# Patient Record
Sex: Female | Born: 1960 | Race: Black or African American
Health system: Southern US, Community
[De-identification: ages and names within clinical notes are randomized; demographics above are authoritative.]

## PROBLEM LIST (undated history)

## (undated) DIAGNOSIS — K623 Rectal prolapse: Secondary | ICD-10-CM

## (undated) DIAGNOSIS — G8929 Other chronic pain: Principal | ICD-10-CM

## (undated) DIAGNOSIS — F32A Depression, unspecified: Secondary | ICD-10-CM

## (undated) DIAGNOSIS — S99929A Unspecified injury of unspecified foot, initial encounter: Secondary | ICD-10-CM

## (undated) DIAGNOSIS — S8990XA Unspecified injury of unspecified lower leg, initial encounter: Secondary | ICD-10-CM

## (undated) DIAGNOSIS — M545 Low back pain: Principal | ICD-10-CM

## (undated) DIAGNOSIS — Z22322 Carrier or suspected carrier of Methicillin resistant Staphylococcus aureus: Secondary | ICD-10-CM

## (undated) DIAGNOSIS — N390 Urinary tract infection, site not specified: Secondary | ICD-10-CM

## (undated) DIAGNOSIS — Z803 Family history of malignant neoplasm of breast: Secondary | ICD-10-CM

## (undated) DIAGNOSIS — F319 Bipolar disorder, unspecified: Secondary | ICD-10-CM

## (undated) DIAGNOSIS — R079 Chest pain, unspecified: Secondary | ICD-10-CM

## (undated) DIAGNOSIS — D2320 Other benign neoplasm of skin of unspecified ear and external auricular canal: Secondary | ICD-10-CM

## (undated) DIAGNOSIS — Z8042 Family history of malignant neoplasm of prostate: Secondary | ICD-10-CM

## (undated) DIAGNOSIS — L738 Other specified follicular disorders: Secondary | ICD-10-CM

## (undated) DIAGNOSIS — F329 Major depressive disorder, single episode, unspecified: Secondary | ICD-10-CM

## (undated) DIAGNOSIS — H6990 Unspecified Eustachian tube disorder, unspecified ear: Secondary | ICD-10-CM

## (undated) DIAGNOSIS — C259 Malignant neoplasm of pancreas, unspecified: Secondary | ICD-10-CM

## (undated) DIAGNOSIS — J449 Chronic obstructive pulmonary disease, unspecified: Secondary | ICD-10-CM

## (undated) DIAGNOSIS — N949 Unspecified condition associated with female genital organs and menstrual cycle: Secondary | ICD-10-CM

## (undated) DIAGNOSIS — H6980 Other specified disorders of Eustachian tube, unspecified ear: Secondary | ICD-10-CM

## (undated) DIAGNOSIS — Z8669 Personal history of other diseases of the nervous system and sense organs: Secondary | ICD-10-CM

## (undated) DIAGNOSIS — S3992XA Unspecified injury of lower back, initial encounter: Secondary | ICD-10-CM

## (undated) DIAGNOSIS — Z808 Family history of malignant neoplasm of other organs or systems: Secondary | ICD-10-CM

## (undated) DIAGNOSIS — R599 Enlarged lymph nodes, unspecified: Secondary | ICD-10-CM

## (undated) DIAGNOSIS — S99919A Unspecified injury of unspecified ankle, initial encounter: Secondary | ICD-10-CM

## (undated) DIAGNOSIS — L678 Other hair color and hair shaft abnormalities: Secondary | ICD-10-CM

## (undated) DIAGNOSIS — T8859XA Other complications of anesthesia, initial encounter: Secondary | ICD-10-CM

## (undated) HISTORY — PX: CHOLECYSTECTOMY: SHX55

## (undated) HISTORY — PX: JOINT REPLACEMENT: SHX530

## (undated) HISTORY — DX: Other complications of anesthesia, initial encounter: T88.59XA

## (undated) HISTORY — PX: HYSTERECTOMY: SHX81

## (undated) HISTORY — DX: Other specified disorders of Eustachian tube, unspecified ear: H69.80

## (undated) HISTORY — DX: Family history of malignant neoplasm of prostate: Z80.42

## (undated) HISTORY — DX: Low back pain: M54.5

## (undated) HISTORY — DX: Unspecified injury of lower back, initial encounter: S39.92XA

## (undated) HISTORY — DX: Major depressive disorder, single episode, unspecified: F32.9

## (undated) HISTORY — DX: Unspecified injury of unspecified lower leg, initial encounter: S99.929A

## (undated) HISTORY — DX: Unspecified condition associated with female genital organs and menstrual cycle: N94.9

## (undated) HISTORY — DX: Other hair color and hair shaft abnormalities: L67.8

## (undated) HISTORY — DX: Bipolar disorder, unspecified: F31.9

## (undated) HISTORY — DX: Unspecified eustachian tube disorder, unspecified ear: H69.90

## (undated) HISTORY — DX: Other specified follicular disorders: L73.8

## (undated) HISTORY — DX: Other chronic pain: G89.29

## (undated) HISTORY — DX: Family history of malignant neoplasm of other organs or systems: Z80.8

## (undated) HISTORY — DX: Malignant neoplasm of pancreas, unspecified: C25.9

## (undated) HISTORY — DX: Family history of malignant neoplasm of breast: Z80.3

## (undated) HISTORY — DX: Rectal prolapse: K62.3

## (undated) HISTORY — DX: Personal history of other diseases of the nervous system and sense organs: Z86.69

## (undated) HISTORY — DX: Unspecified injury of unspecified lower leg, initial encounter: S89.90XA

## (undated) HISTORY — DX: Depression, unspecified: F32.A

## (undated) HISTORY — DX: Carrier or suspected carrier of methicillin resistant Staphylococcus aureus: Z22.322

## (undated) HISTORY — DX: Enlarged lymph nodes, unspecified: R59.9

## (undated) HISTORY — DX: Unspecified injury of unspecified ankle, initial encounter: S99.919A

## (undated) HISTORY — DX: Other benign neoplasm of skin of unspecified ear and external auricular canal: D23.20

## (undated) HISTORY — DX: Chest pain, unspecified: R07.9

## (undated) HISTORY — DX: Urinary tract infection, site not specified: N39.0

## (undated) HISTORY — PX: ABDOMINAL HYSTERECTOMY: SHX81

---

## 2000-02-09 HISTORY — PX: CHOLECYSTECTOMY: SHX55

## 2000-02-09 HISTORY — PX: GANGLION CYST EXCISION: SHX1691

## 2002-02-08 HISTORY — PX: FOOT SURGERY: SHX648

## 2003-04-03 ENCOUNTER — Other Ambulatory Visit: Payer: Self-pay

## 2003-11-30 ENCOUNTER — Emergency Department: Payer: Self-pay | Admitting: General Practice

## 2004-08-31 ENCOUNTER — Ambulatory Visit: Payer: Self-pay | Admitting: Family Medicine

## 2005-02-06 ENCOUNTER — Emergency Department: Payer: Self-pay | Admitting: Emergency Medicine

## 2005-03-18 ENCOUNTER — Emergency Department: Payer: Self-pay | Admitting: Emergency Medicine

## 2005-03-25 ENCOUNTER — Encounter: Payer: Self-pay | Admitting: Family Medicine

## 2005-03-27 ENCOUNTER — Ambulatory Visit: Payer: Self-pay | Admitting: Family Medicine

## 2005-04-08 ENCOUNTER — Encounter: Payer: Self-pay | Admitting: Family Medicine

## 2005-06-05 ENCOUNTER — Emergency Department: Payer: Self-pay | Admitting: Internal Medicine

## 2006-04-19 ENCOUNTER — Emergency Department: Payer: Self-pay | Admitting: Emergency Medicine

## 2006-04-21 ENCOUNTER — Emergency Department: Payer: Self-pay | Admitting: Emergency Medicine

## 2006-04-26 ENCOUNTER — Ambulatory Visit: Payer: Self-pay | Admitting: Emergency Medicine

## 2006-06-19 ENCOUNTER — Emergency Department: Payer: Self-pay

## 2006-10-31 ENCOUNTER — Ambulatory Visit: Payer: Self-pay | Admitting: Family Medicine

## 2006-11-25 ENCOUNTER — Ambulatory Visit: Payer: Self-pay | Admitting: Family Medicine

## 2007-01-05 ENCOUNTER — Emergency Department: Payer: Self-pay | Admitting: Unknown Physician Specialty

## 2008-02-09 HISTORY — PX: OTHER SURGICAL HISTORY: SHX169

## 2008-02-09 HISTORY — PX: PARTIAL HYSTERECTOMY: SHX80

## 2012-02-22 ENCOUNTER — Ambulatory Visit: Payer: Self-pay | Admitting: Family Medicine

## 2012-07-28 DIAGNOSIS — F3110 Bipolar disorder, current episode manic without psychotic features, unspecified: Secondary | ICD-10-CM | POA: Insufficient documentation

## 2012-07-28 DIAGNOSIS — F603 Borderline personality disorder: Secondary | ICD-10-CM | POA: Insufficient documentation

## 2012-12-02 ENCOUNTER — Emergency Department: Payer: Self-pay | Admitting: Emergency Medicine

## 2013-04-18 ENCOUNTER — Emergency Department: Payer: Self-pay | Admitting: Emergency Medicine

## 2013-04-18 LAB — BASIC METABOLIC PANEL
Anion Gap: 4 — ABNORMAL LOW (ref 7–16)
BUN: 8 mg/dL (ref 7–18)
CALCIUM: 9.3 mg/dL (ref 8.5–10.1)
CHLORIDE: 105 mmol/L (ref 98–107)
CO2: 29 mmol/L (ref 21–32)
Creatinine: 1.03 mg/dL (ref 0.60–1.30)
EGFR (African American): >60
EGFR (Non-African Amer.): >60
Glucose: 100 mg/dL — ABNORMAL HIGH (ref 65–99)
OSMOLALITY: 274 (ref 275–301)
POTASSIUM: 3 mmol/L — AB (ref 3.5–5.1)
Sodium: 138 mmol/L (ref 136–145)

## 2013-04-18 LAB — CBC
HCT: 38 % (ref 35.0–47.0)
HGB: 13.1 g/dL (ref 12.0–16.0)
MCH: 31.5 pg (ref 26.0–34.0)
MCHC: 34.5 g/dL (ref 32.0–36.0)
MCV: 91 fL (ref 80–100)
PLATELETS: 295 10*3/uL (ref 150–440)
RBC: 4.17 10*6/uL (ref 3.80–5.20)
RDW: 13.9 % (ref 11.5–14.5)
WBC: 8 10*3/uL (ref 3.6–11.0)

## 2013-04-18 LAB — TROPONIN I

## 2013-05-01 ENCOUNTER — Ambulatory Visit: Payer: Self-pay | Admitting: Family Medicine

## 2013-06-29 ENCOUNTER — Ambulatory Visit: Payer: Self-pay | Admitting: Gastroenterology

## 2013-07-06 ENCOUNTER — Ambulatory Visit: Payer: Self-pay | Admitting: Family Medicine

## 2013-09-24 ENCOUNTER — Emergency Department: Payer: Self-pay | Admitting: Emergency Medicine

## 2013-09-24 LAB — URINALYSIS, COMPLETE
BLOOD: NEGATIVE
Bilirubin,UR: NEGATIVE
GLUCOSE, UR: NEGATIVE mg/dL (ref 0–75)
Ketone: NEGATIVE
NITRITE: NEGATIVE
PROTEIN: NEGATIVE
Ph: 6 (ref 4.5–8.0)
RBC, UR: NONE SEEN /HPF (ref 0–5)
Specific Gravity: 1.002 (ref 1.003–1.030)
WBC UR: 19 /HPF (ref 0–5)

## 2013-09-26 LAB — URINE CULTURE

## 2013-10-22 ENCOUNTER — Encounter: Payer: Self-pay | Admitting: Neurology

## 2013-10-23 ENCOUNTER — Encounter (INDEPENDENT_AMBULATORY_CARE_PROVIDER_SITE_OTHER): Payer: Self-pay

## 2013-10-23 ENCOUNTER — Ambulatory Visit (INDEPENDENT_AMBULATORY_CARE_PROVIDER_SITE_OTHER): Payer: 59 | Admitting: Neurology

## 2013-10-23 ENCOUNTER — Encounter: Payer: Self-pay | Admitting: Neurology

## 2013-10-23 VITALS — BP 131/67 | HR 85 | Ht 61.0 in | Wt 149.0 lb

## 2013-10-23 DIAGNOSIS — M545 Low back pain, unspecified: Secondary | ICD-10-CM

## 2013-10-23 DIAGNOSIS — G8929 Other chronic pain: Secondary | ICD-10-CM

## 2013-10-23 HISTORY — DX: Other chronic pain: G89.29

## 2013-10-23 HISTORY — DX: Low back pain, unspecified: M54.50

## 2013-10-23 MED ORDER — DIAZEPAM 2 MG PO TABS: 2.0000 mg | ORAL_TABLET | Freq: Two times a day (BID) | ORAL | Status: DC

## 2013-10-23 MED ORDER — NAPROXEN 500 MG PO TABS: 500.0000 mg | ORAL_TABLET | Freq: Two times a day (BID) | ORAL | Status: DC

## 2013-10-23 NOTE — Patient Instructions (Signed)
Back Pain, Adult Low back pain is very common. About 1 in 5 people have back pain.The cause of low back pain is rarely dangerous. The pain often gets better over time.About half of people with a sudden onset of back pain feel better in just 2 weeks. About 8 in 10 people feel better by 6 weeks.  CAUSES Some common causes of back pain include:  Strain of the muscles or ligaments supporting the spine.  Wear and tear (degeneration) of the spinal discs.  Arthritis.  Direct injury to the back. DIAGNOSIS Most of the time, the direct cause of low back pain is not known.However, back pain can be treated effectively even when the exact cause of the pain is unknown.Answering your caregiver's questions about your overall health and symptoms is one of the most accurate ways to make sure the cause of your pain is not dangerous. If your caregiver needs more information, he or she may order lab work or imaging tests (X-rays or MRIs).However, even if imaging tests show changes in your back, this usually does not require surgery. HOME CARE INSTRUCTIONS For many people, back pain returns.Since low back pain is rarely dangerous, it is often a condition that people can learn to manageon their own.   Remain active. It is stressful on the back to sit or stand in one place. Do not sit, drive, or stand in one place for more than 30 minutes at a time. Take short walks on level surfaces as soon as pain allows.Try to increase the length of time you walk each day.  Do not stay in bed.Resting more than 1 or 2 days can delay your recovery.  Do not avoid exercise or work.Your body is made to move.It is not dangerous to be active, even though your back may hurt.Your back will likely heal faster if you return to being active before your pain is gone.  Pay attention to your body when you bend and lift. Many people have less discomfortwhen lifting if they bend their knees, keep the load close to their bodies,and  avoid twisting. Often, the most comfortable positions are those that put less stress on your recovering back.  Find a comfortable position to sleep. Use a firm mattress and lie on your side with your knees slightly bent. If you lie on your back, put a pillow under your knees.  Only take over-the-counter or prescription medicines as directed by your caregiver. Over-the-counter medicines to reduce pain and inflammation are often the most helpful.Your caregiver may prescribe muscle relaxant drugs.These medicines help dull your pain so you can more quickly return to your normal activities and healthy exercise.  Put ice on the injured area.  Put ice in a plastic bag.  Place a towel between your skin and the bag.  Leave the ice on for 15-20 minutes, 03-04 times a day for the first 2 to 3 days. After that, ice and heat may be alternated to reduce pain and spasms.  Ask your caregiver about trying back exercises and gentle massage. This may be of some benefit.  Avoid feeling anxious or stressed.Stress increases muscle tension and can worsen back pain.It is important to recognize when you are anxious or stressed and learn ways to manage it.Exercise is a great option. SEEK MEDICAL CARE IF:  You have pain that is not relieved with rest or medicine.  You have pain that does not improve in 1 week.  You have new symptoms.  You are generally not feeling well. SEEK   IMMEDIATE MEDICAL CARE IF:   You have pain that radiates from your back into your legs.  You develop new bowel or bladder control problems.  You have unusual weakness or numbness in your arms or legs.  You develop nausea or vomiting.  You develop abdominal pain.  You feel faint. Document Released: 01/25/2005 Document Revised: 07/27/2011 Document Reviewed: 05/29/2013 Exit[employer] Patient Information 2015 [employer]. This information is not intended to replace advice given to you by your health care provider. Make sure you  discuss any questions you have with your health care provider.  

## 2013-10-23 NOTE — Progress Notes (Signed)
Reason for visit: Back pain  Sheryl Perez is a 53 y.o. female  History of present illness:  Ms. Witz is a 53 year old right-handed white female with a history of low back pain that began approximately one month ago. The patient was cleaning a glass in her car, and when she came out of the car, she twisted her back, and began having left-sided low back pain without radiation down the legs. The pain has persisted, and it is most severe when she is transitioning from sitting to standing, but prolonged sitting will also bother her. The patient has had no numbness or weakness of extremities. She does have some mid thoracic pain as well. She has been placed on diazepam with some benefit. She indicates that she take some Ultram also if needed for the pain. The patient denies any balance problems. 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 The patient is having difficulty doing this. She has been seen by her chiropractor, and she is getting chiropractic therapy. The patient is sent to this office for an evaluation.  Past Medical History  Diagnosis Date  . Depression   . Bipolar disorder   . Chronic UTI   . Other specified disease of hair and hair follicles   . Injury, other and unspecified, knee, leg, ankle, and foot   . Chest pain, unspecified   . Dysfunction of eustachian tube   . Unspecified symptom associated with female genital organs   . Rectal prolapse   . Enlargement of lymph nodes   . 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 Back injury   . Chronic low back pain 10/23/2013  . History of Bell's palsy Right    Past Surgical History  Procedure Laterality Date  . Cholecystectomy  2002  . Foot surgery  2004    bunion  . Ganglion cyst excision  2002  . Partial hysterectomy  2010    uterus removed  . Bladder tack  2010    Family History  Problem Relation Age of Onset  . Parkinson's disease Maternal Grandfather   .  Diabetes Father   . Cancer Father     brain tumor  . Diabetes Paternal Grandfather   . Colon polyps Mother   . Heart disease Maternal Grandmother   . Heart attack Maternal Grandmother   . Stroke Maternal Grandmother     Social history:  reports that she has been smoking Cigarettes.  She has a 40 pack-year smoking history. She has never used smokeless tobacco. She reports that she drinks alcohol. She reports that she uses illicit drugs (Marijuana).  Medications:  Current Outpatient Prescriptions on File Prior to Visit  Medication Sig Dispense Refill  . calcium-vitamin D (OSCAL) 250-125 MG-UNIT per tablet Take 1 tablet by mouth daily.      . cyanocobalamin 1000 MCG tablet Take 100 mcg by mouth daily.      Marland Kitchen LORazepam (ATIVAN) 1 MG tablet Take 1 mg by mouth every 8 (eight) hours.      . Multiple Vitamin (MULTIVITAMIN) tablet Take 1 tablet by mouth daily.      . traMADol (ULTRAM) 50 MG tablet Take 50 mg by mouth every 6 (six) hours as needed.      . traZODone (DESYREL) 50 MG tablet Take 50 mg by mouth at bedtime.       No current facility-administered medications on file prior to visit.  Allergies  Allergen Reactions  . Sulfa Antibiotics Swelling    Diarrhea,dizziness, tongue swelling  . Abilify [Aripiprazole]   . Cephalosporins Rash  . Erythromycin Other (See Comments)    Abd. pain  . Risperdal [Risperidone]     tremors    ROS:  Out of a complete 14 system review of symptoms, the patient complains only of the following symptoms, and all other reviewed systems are negative.  Fatigue Urination problems Joint pain, achy muscles Allergies Depression, anxiety  Blood pressure 131/67, pulse 85, height 5\' 1"  (1.549 m), weight 149 lb (67.586 kg).  Physical Exam  General: The patient is alert and cooperative at the time of the examination.  Eyes: Pupils are equal, round, and reactive to light. Discs are flat bilaterally.  Neck: The neck is supple, no carotid bruits are  noted.  Respiratory: The respiratory examination is clear.  Cardiovascular: The cardiovascular examination reveals a regular rate and rhythm, no obvious murmurs or rubs are noted.  Neuromuscular: The patient is full range of movement of the lumbosacral spine.  Skin: Extremities are without significant edema.  Neurologic Exam  Mental status: The patient is alert and oriented x 3 at the time of the examination. The patient has apparent normal recent and remote memory, with an apparently normal attention span and concentration ability.  Cranial nerves: Facial symmetry is not present. There is relative depression of the left nasolabial fold as compared to the right. Movements of seen synkinesis are seen around the right eye with talking. There is good sensation of the face to pinprick and soft touch bilaterally. The strength of the facial muscles and the muscles to head turning and shoulder shrug are normal bilaterally. Speech is well enunciated, no aphasia or dysarthria is noted. Extraocular movements are full. Visual fields are full. The tongue is midline, and the patient has symmetric elevation of the soft palate. No obvious hearing deficits are noted.  Motor: The motor testing reveals 5 over 5 strength of all 4 extremities. Good symmetric motor tone is noted throughout.  Sensory: Sensory testing is intact to pinprick, soft touch, vibration sensation, and position sense on all 4 extremities. No evidence of extinction is noted.  Coordination: Cerebellar testing reveals good finger-nose-finger and heel-to-shin bilaterally.  Gait and station: Gait is normal. Tandem gait is normal. Romberg is negative. No drift is seen.  Reflexes: Deep tendon reflexes are symmetric and normal bilaterally. Toes are downgoing bilaterally.   Assessment/Plan:  1. Low back pain  The patient has developed left-sided low back pain within the last month. The patient will be placed back on diazepam taking 2 mg twice  daily, and go on naproxen taking 500 mg twice daily. The patient will continue her chiropractic therapies, but she has not improved over the next 3 weeks or so, she is to contact our office. At that point, we will consider MRI evaluation of the lumbosacral spine. The patient will followup in about 2 months. Clinical examination and history do not support a lumbosacral radiculopathy.  Jill Alexanders MD 10/23/2013 8:37 PM  [facility] [address] [place], [place] [address]  Phone [phone] Fax [phone]

## 2013-10-29 ENCOUNTER — Other Ambulatory Visit: Payer: Self-pay | Admitting: Neurology

## 2013-10-29 ENCOUNTER — Telehealth: Payer: Self-pay | Admitting: Neurology

## 2013-10-29 NOTE — Telephone Encounter (Signed)
Patient stated [facility] didn't get Rx refill request traMADol (ULTRAM) 50 MG tablet.  Please call when ready to pick up.

## 2013-10-29 NOTE — Telephone Encounter (Signed)
Error

## 2013-11-02 ENCOUNTER — Telehealth: Payer: Self-pay | Admitting: Neurology

## 2013-11-02 NOTE — Telephone Encounter (Signed)
Patient is calling. Rx for Tramadol was sent to [facility] in [place] but for only 20 pills. Rx should be for 60 pills to take twice a day. Please call patient and advise.

## 2013-11-02 NOTE — Telephone Encounter (Signed)
See phone note

## 2013-11-02 NOTE — Telephone Encounter (Signed)
I am not sure how this rx was done. No doccumentation in my note, the Rx was written on 10/30/13, I saw the patient 10/23/13, no note doccumenting that the patient called our office for a refill. I have not given her this Rx before.

## 2013-11-06 MED ORDER — TRAMADOL HCL 50 MG PO TABS: 100.0000 mg | ORAL_TABLET | Freq: Four times a day (QID) | ORAL | Status: DC | PRN

## 2013-11-06 NOTE — Telephone Encounter (Addendum)
I will rewrite the prescription for ultram.

## 2013-11-06 NOTE — Telephone Encounter (Signed)
Spoke with Dr. Tobey Grim assistant Davy Pique, [place] and Davy Pique stated that Dr. Jannifer Franklin and herself had this conversation concerning this Rx and the pt. Please discuss with Davy Pique, CMA. Thanks

## 2013-11-06 NOTE — Addendum Note (Signed)
Addended by: Margette Fast on: 11/06/2013 01:25 PM   Modules accepted: Orders

## 2013-12-25 ENCOUNTER — Telehealth: Payer: Self-pay | Admitting: Neurology

## 2013-12-25 MED ORDER — DIAZEPAM 2 MG PO TABS: 2.0000 mg | ORAL_TABLET | Freq: Two times a day (BID) | ORAL | Status: DC

## 2013-12-25 MED ORDER — TRAMADOL HCL 50 MG PO TABS: 100.0000 mg | ORAL_TABLET | Freq: Four times a day (QID) | ORAL | Status: DC | PRN

## 2013-12-25 MED ORDER — NAPROXEN 500 MG PO TABS: 500.0000 mg | ORAL_TABLET | Freq: Two times a day (BID) | ORAL | Status: DC

## 2013-12-25 NOTE — Telephone Encounter (Signed)
I called the patient. She is not requesting a refill on the trazodone, she wants a refill on the diazepam, Naprosyn, and Ultram. I will give the refills.

## 2013-12-25 NOTE — Telephone Encounter (Signed)
Patient called requesting we fill Trazodone.  It does not appear we have written this Rx before.  Would you like to prescribe? Please advise.  Thank you.  As well, they are requesting Naproxen and Diazepam refills.

## 2013-12-25 NOTE — Telephone Encounter (Signed)
Patient requesting Rx refill for traZODone (DESYREL) 50 MG tablet, diazepam (VALIUM) 2 MG tablet and naproxen (NAPROSYN) 500 MG tablet.  Please call and advise.

## 2013-12-28 ENCOUNTER — Ambulatory Visit: Payer: 59 | Admitting: Adult Health

## 2014-02-12 ENCOUNTER — Other Ambulatory Visit: Payer: Self-pay | Admitting: Neurology

## 2014-02-15 ENCOUNTER — Ambulatory Visit: Payer: 59 | Admitting: Adult Health

## 2014-03-15 ENCOUNTER — Other Ambulatory Visit: Payer: Self-pay | Admitting: Neurology

## 2014-04-26 ENCOUNTER — Telehealth: Payer: Self-pay | Admitting: Adult Health

## 2014-04-26 ENCOUNTER — Encounter: Payer: Self-pay | Admitting: Adult Health

## 2014-04-26 ENCOUNTER — Ambulatory Visit (INDEPENDENT_AMBULATORY_CARE_PROVIDER_SITE_OTHER): Payer: 59 | Admitting: Adult Health

## 2014-04-26 VITALS — BP 136/75 | HR 74 | Ht 61.0 in | Wt 156.0 lb

## 2014-04-26 DIAGNOSIS — M545 Low back pain, unspecified: Secondary | ICD-10-CM

## 2014-04-26 NOTE — Progress Notes (Signed)
PATIENT: Sheryl Perez DOB: [birth year]  REASON FOR VISIT: follow up- low back pain HISTORY FROM: patient  HISTORY OF PRESENT ILLNESS: Sheryl Perez is a 54 year old female with a history of low back pain. She returns today for follow-up. She states that her back pain has been controlled with diazepam, naproxen and Ultram. However she states that approximately 1 month ago she had an injury while at work. 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 She states that she begin to have low back pain in the midline. She also had some straining of the thigh muscles as well as the shoulders. She states that the medication has helped however the pain has continued especially after she's been sitting for a long period of time. She has continued to go to the chiropractor and it offers temporary relief. She denies any numbness or tingling down the legs. No changes with the bowels or bladder. She returns today for an evaluation.  Sheryl Perez is a 54 year old right-handed white female with a history of low back pain that began approximately one month ago. The patient was cleaning a glass in her car, and when she came out of the car, she twisted her back, and began having left-sided low back pain without radiation down the legs. The pain has persisted, and it is most severe when she is transitioning from sitting to standing, but prolonged sitting will also bother her. The patient has had no numbness or weakness of extremities. She does have some mid thoracic pain as well. She has been placed on diazepam with some benefit. She indicates that she take some Ultram also if needed for the pain. The patient denies any balance problems. 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 The patient is having difficulty doing this. She has been seen by her chiropractor, and she is getting chiropractic therapy. The patient is sent to this  office for an evaluation  REVIEW OF SYSTEMS: Out of a complete 14 system review of symptoms, the patient complains only of the following symptoms, and all other reviewed systems are negative.  Joint pain, joint swelling, back pain, aching muscles, wounds, itching  ALLERGIES: Allergies  Allergen Reactions  . Sulfa Antibiotics Swelling    Diarrhea,dizziness, tongue swelling  . Abilify [Aripiprazole]   . Cephalosporins Rash  . Erythromycin Other (See Comments)    Abd. pain  . Risperdal [Risperidone]     tremors    HOME MEDICATIONS: Outpatient Prescriptions Prior to Visit  Medication Sig Dispense Refill  . Ascorbic Acid (VITAMIN C) 1000 MG tablet Take 1,000 mg by mouth daily.    . Calcium Citrate-Vitamin D (CALCIUM CITRATE + D PO) Take 1 capsule by mouth daily.    . calcium-vitamin D (OSCAL) 250-125 MG-UNIT per tablet Take 1 tablet by mouth daily.    . diazepam (VALIUM) 2 MG tablet Take 1 tablet (2 mg total) by mouth 2 (two) times daily. 60 tablet 1  . Ginkgo Biloba 120 MG CAPS Take 1 capsule by mouth daily.    . Multiple Vitamin (MULTIVITAMIN) tablet Take 1 tablet by mouth daily.    . naproxen (NAPROSYN) 500 MG tablet TAKE 1 TABLET BY MOUTH TWICE A DAY WITH A MEAL 60 tablet 3  . traMADol (ULTRAM) 50 MG tablet Take 2 tablets (100 mg total) by mouth every 6 (six) hours as needed. Must last 28 days 60  tablet 1  . traZODone (DESYREL) 50 MG tablet Take 50 mg by mouth at bedtime.    . cyanocobalamin 1000 MCG tablet Take 100 mcg by mouth daily.    Marland Kitchen LORazepam (ATIVAN) 1 MG tablet Take 1 mg by mouth every 8 (eight) hours.     No facility-administered medications prior to visit.    PAST MEDICAL HISTORY: Past Medical History  Diagnosis Date  . Depression   . Bipolar disorder   . Chronic UTI   . Other specified disease of hair and hair follicles   . Injury, other and unspecified, knee, leg, ankle, and foot   . Chest pain, unspecified   . Dysfunction of eustachian tube   . Unspecified  symptom associated with female genital organs   . Rectal prolapse   . Enlargement of lymph nodes   . 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 Back injury   . Chronic low back pain 10/23/2013  . History of Bell's palsy Right    PAST SURGICAL HISTORY: Past Surgical History  Procedure Laterality Date  . Cholecystectomy  2002  . Foot surgery  2004    bunion  . Ganglion cyst excision  2002  . Partial hysterectomy  2010    uterus removed  . Bladder tack  2010    FAMILY HISTORY: Family History  Problem Relation Age of Onset  . Parkinson's disease Maternal Grandfather   . Diabetes Father   . Cancer Father     brain tumor  . Diabetes Paternal Grandfather   . Colon polyps Mother   . Heart disease Maternal Grandmother   . Heart attack Maternal Grandmother   . Stroke Maternal Grandmother     SOCIAL HISTORY: History   Social History  . Marital Status: Married    Spouse Name: N/A  . Number of Children: 3  . Years of Education: hs   Occupational History  . [employer]   Social History Main Topics  . Smoking status: Current Every Day Smoker -- 1.00 packs/day for 40 years    Types: Cigarettes  . Smokeless tobacco: Never Used  . Alcohol Use: 0.0 oz/week    0 Standard drinks or equivalent per week     Comment: occasional  . Drug Use: Yes    Special: Marijuana  . Sexual Activity: Not on file   Other Topics Concern  . Not on file   Social History Narrative   Patient lives at home with her husband [name]).   Patient works full time.   Education CNA    Right handed.   Caffeine Tea , Soda.      PHYSICAL EXAM  Filed Vitals:   04/26/14 1141  BP: 136/75  Pulse: 74  Height: 5\' 1"  (1.549 m)  Weight: 156 lb (70.761 kg)   Body mass index is 29.49 kg/(m^2).  Generalized: Well developed, in no acute distress   Neurological examination  Mentation: Alert oriented to time, place, history taking. Follows all commands speech and  language fluent Cranial nerve II-XII: Pupils were equal round reactive to light. Extraocular movements were full, visual field were full on confrontational test. Facial sensation and strength were normal. Uvula tongue midline. Head turning and shoulder shrug  were normal and symmetric. Motor: The motor testing reveals 5 over 5 strength of all 4 extremities. Good symmetric motor tone is noted throughout. Mild tenderness to palpation in the lower back in the midline region. Sensory: Sensory testing is  intact to soft touch on all 4 extremities. No evidence of extinction is noted.  Coordination: Cerebellar testing reveals good finger-nose-finger and heel-to-shin bilaterally.  Gait and station: Gait is slightly wide-based. Tandem gait is normal. Romberg is negative. No drift is seen.  Reflexes: Deep tendon reflexes are symmetric and normal bilaterally.    DIAGNOSTIC DATA (LABS, IMAGING, TESTING) - I reviewed patient records, labs, notes, testing and imaging myself where available.     ASSESSMENT AND PLAN 54 y.o. 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 here with:  1. Low back pain  Patient's back pain has improved with medication however she reinjured her back about 1 month ago. She has continued taking the medication as well as receiving chiropractor therapy however this has only offered temporary relief. I will check an MRI of the lumbar spine. Pending those results we will decide on treatment. She will follow-up in 3-4 months or sooner if  needed.   Ward Givens, MSN, NP-C 04/26/2014, 12:00 PM [facility] [address] [place], [place] [address] [phone]  Note: This document was prepared with digital dictation and possible smart phrase technology. Any transcriptional errors that result from this process are unintentional.

## 2014-04-26 NOTE — Progress Notes (Signed)
I have read the note, and I agree with the clinical assessment and plan.  Kenlyn Lose KEITH   

## 2014-04-26 NOTE — Patient Instructions (Signed)
Continue Medications I will check MRI lumbar spine.

## 2014-04-26 NOTE — Telephone Encounter (Signed)
mri lumbar denied case # [id number] please call [phone] for peer to peer

## 2014-04-26 NOTE — Telephone Encounter (Signed)
The case will be denied because the symptoms have been present for only 4 weeks. I will wait 2 weeks, call the patient, if she is still having symptoms, I will call to get the denial overturned. Must wait at least 6 weeks with treatment before MRI can be done.

## 2014-04-29 NOTE — Telephone Encounter (Signed)
I called the patient she is aware. I will call in 2 weeks if no improvement in symptoms then we will reorder the MRI of the lumbar spine.

## 2014-05-04 ENCOUNTER — Other Ambulatory Visit: Payer: Self-pay | Admitting: Neurology

## 2014-05-06 NOTE — Telephone Encounter (Signed)
[facility] faxed Rx

## 2014-05-21 ENCOUNTER — Telehealth: Payer: Self-pay | Admitting: Adult Health

## 2014-05-21 NOTE — Telephone Encounter (Signed)
-----   Message from Trudie Buckler, NP sent at 04/29/2014 12:25 PM EDT ----- Regarding: MRI lumbar MRI lumbar

## 2014-05-21 NOTE — Telephone Encounter (Signed)
I called the patient and left a message for her to call our office. This is in regards to her low back pain and if her symptoms have improved. If not we will reorder the MRI of the lumbar spine.

## 2014-05-22 ENCOUNTER — Telehealth: Payer: Self-pay | Admitting: Adult Health

## 2014-05-22 DIAGNOSIS — M545 Low back pain, unspecified: Secondary | ICD-10-CM

## 2014-05-22 NOTE — Telephone Encounter (Signed)
I called the patient. She states that she has continued to have back pain. She states that her pain is located from the middle back down to the lower back. Denies any pain or numbness down the legs. 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 She followed up with the workman compensation doctor. Who has set her up an appointment with an orthopedic surgeon at [facility]. She has not gotten this appointment yet she is waiting for her records to be faxed to this office. I will try to reorder the MRI of the lumbar spine. If this gets denied again we may have to wait until the patient follows up with the orthopedic surgeon for their recommendations.

## 2014-05-22 NOTE — Telephone Encounter (Signed)
patient returning your call to discuss back pain and reordering MRI please call

## 2014-05-28 ENCOUNTER — Telehealth: Payer: Self-pay | Admitting: Neurology

## 2014-05-28 NOTE — Telephone Encounter (Signed)
-----   Message from Kathrynn Ducking, MD sent at 04/26/2014  4:28 PM EDT ----- Please call insurance company for MRI lumbar denial.  Telephone number [phone]. A case number is [id number].

## 2014-05-28 NOTE — Telephone Encounter (Signed)
The patient was called. She is still having back pain. The MRI of the L/S spine was reordered.

## 2014-06-21 ENCOUNTER — Other Ambulatory Visit: Payer: Self-pay | Admitting: Family Medicine

## 2014-06-21 DIAGNOSIS — Z1231 Encounter for screening mammogram for malignant neoplasm of breast: Secondary | ICD-10-CM

## 2014-06-26 ENCOUNTER — Ambulatory Visit: Payer: Self-pay

## 2014-07-04 ENCOUNTER — Other Ambulatory Visit: Payer: Self-pay | Admitting: Family Medicine

## 2014-07-04 ENCOUNTER — Ambulatory Visit
Admission: RE | Admit: 2014-07-04 | Discharge: 2014-07-04 | Disposition: A | Discharge status: Home / Self Care | Payer: 59 | Source: Ambulatory Visit | Attending: Family Medicine | Admitting: Family Medicine

## 2014-07-04 DIAGNOSIS — Z1231 Encounter for screening mammogram for malignant neoplasm of breast: Secondary | ICD-10-CM

## 2014-07-04 DIAGNOSIS — R922 Inconclusive mammogram: Secondary | ICD-10-CM | POA: Insufficient documentation

## 2014-07-04 DIAGNOSIS — R928 Other abnormal and inconclusive findings on diagnostic imaging of breast: Secondary | ICD-10-CM

## 2014-07-10 ENCOUNTER — Ambulatory Visit
Admission: RE | Admit: 2014-07-10 | Discharge: 2014-07-10 | Disposition: A | Discharge status: Home / Self Care | Payer: 59 | Source: Ambulatory Visit | Attending: Family Medicine | Admitting: Family Medicine

## 2014-07-10 DIAGNOSIS — N63 Unspecified lump in breast: Secondary | ICD-10-CM | POA: Insufficient documentation

## 2014-07-10 DIAGNOSIS — R928 Other abnormal and inconclusive findings on diagnostic imaging of breast: Secondary | ICD-10-CM

## 2014-07-11 ENCOUNTER — Ambulatory Visit (INDEPENDENT_AMBULATORY_CARE_PROVIDER_SITE_OTHER): Payer: 59 | Admitting: Family Medicine

## 2014-07-11 ENCOUNTER — Other Ambulatory Visit: Payer: Self-pay | Admitting: Family Medicine

## 2014-07-11 ENCOUNTER — Encounter: Payer: Self-pay | Admitting: Family Medicine

## 2014-07-11 VITALS — BP 123/80 | HR 86 | Temp 97.8°F | Ht 60.0 in | Wt 170.6 lb

## 2014-07-11 DIAGNOSIS — Z720 Tobacco use: Secondary | ICD-10-CM

## 2014-07-11 DIAGNOSIS — F17200 Nicotine dependence, unspecified, uncomplicated: Secondary | ICD-10-CM | POA: Insufficient documentation

## 2014-07-11 DIAGNOSIS — Z22322 Carrier or suspected carrier of Methicillin resistant Staphylococcus aureus: Secondary | ICD-10-CM

## 2014-07-11 DIAGNOSIS — L732 Hidradenitis suppurativa: Secondary | ICD-10-CM | POA: Diagnosis not present

## 2014-07-11 HISTORY — DX: Carrier or suspected carrier of methicillin resistant Staphylococcus aureus: Z22.322

## 2014-07-11 MED ORDER — CLINDAMYCIN PHOSPHATE 1 % EX SOLN: Freq: Two times a day (BID) | CUTANEOUS | Status: DC

## 2014-07-11 MED ORDER — DOXYCYCLINE HYCLATE 100 MG PO TABS: 100.0000 mg | ORAL_TABLET | Freq: Two times a day (BID) | ORAL | Status: AC

## 2014-07-11 NOTE — Progress Notes (Signed)
Subjective:    Patient ID: Sheryl Perez, female    DOB: 1960/04/10, 54 y.o.   MRN: [id number]  HPI: Sheryl Perez is a 54 y.o. female presenting on 07/11/2014 for cyst L side under armpit   HPI Pt presents for cyst in the axilla. Pt had ultrasound yesterday and they saw cyst in the L axilla. Pt noted that the area opened up and drained 5/31- serosanguinous drainage with foul smell. Pt has had several similar growths in axilla and groin over the past 10 years. Pt is reporting similar growths in the axilla that have drained.  3 in the L and 2 in the right. Pt has had several lanced over the years. No fevers, malaise, rash.  Lesions are tender to the touch.   Past Medical History  Diagnosis Date  . Depression   . Bipolar disorder   . Chronic UTI   . Other specified disease of hair and hair follicles   . Injury, other and unspecified, knee, leg, ankle, and foot   . Chest pain, unspecified   . Dysfunction of eustachian tube   . Unspecified symptom associated with female genital organs   . Rectal prolapse   . Enlargement of lymph nodes   . 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 Back injury   . Chronic low back pain 10/23/2013  . History of Bell's palsy Right   History   Social History  . Marital Status: Married    Spouse Name: N/A  . Number of Children: 3  . Years of Education: hs   Occupational History  . [place]   Social History Main Topics  . Smoking status: Current Every Day Smoker -- 1.00 packs/day for 40 years    Types: Cigarettes  . Smokeless tobacco: Never Used  . Alcohol Use: 0.6 oz/week    0 Standard drinks or equivalent, 1 Glasses of wine per week     Comment: occasional  . Drug Use: No  . Sexual Activity: Not on file   Other Topics Concern  . Not on file   Social History Narrative   Patient lives at home with her husband [name]).   Patient works full time.   Education CNA    Right handed.   Caffeine Tea ,  Soda.   Family History  Problem Relation Age of Onset  . Parkinson's disease Maternal Grandfather   . Diabetes Father   . Cancer Father     brain tumor  . Diabetes Paternal Grandfather   . Colon polyps Mother   . Heart disease Maternal Grandmother   . Heart attack Maternal Grandmother   . Stroke Maternal Grandmother   . Breast cancer Paternal Aunt   . Breast cancer Cousin    Current Outpatient Prescriptions on File Prior to Visit  Medication Sig  . Ascorbic Acid (VITAMIN C) 1000 MG tablet Take 1,000 mg by mouth daily.  . Calcium Citrate-Vitamin D (CALCIUM CITRATE + D PO) Take 1 capsule by mouth daily.  . calcium-vitamin D (OSCAL) 250-125 MG-UNIT per tablet Take 1 tablet by mouth daily.  . diazepam (VALIUM) 2 MG tablet TAKE 1 TABLET BY MOUTH 2 TIMES A DAY  . Ginkgo Biloba 120 MG CAPS Take 1 capsule by mouth daily.  . Multiple Vitamin (MULTIVITAMIN) tablet Take 1 tablet by mouth daily.  . naproxen (NAPROSYN) 500 MG tablet TAKE 1 TABLET BY MOUTH TWICE A DAY WITH A MEAL  .  traMADol (ULTRAM) 50 MG tablet TAKE 2 TABLETS BY MOUTH EVERY 6 HOURS AS NEEDED. **MUST LAST 28 DAYS**  . traZODone (DESYREL) 50 MG tablet Take 50 mg by mouth at bedtime.   No current facility-administered medications on file prior to visit.    Review of Systems  Constitutional: Negative for fever and chills.  HENT: Negative.   Respiratory: Negative.   Gastrointestinal: Negative.   Genitourinary: Negative.   Skin: Positive for wound (Open draining lesion in the L axilla and 3 closed lesions.).  Neurological: Negative.   Psychiatric/Behavioral: Negative.    Per HPI unless specifically indicated above     Objective:    BP 123/80 mmHg  Pulse 86  Temp(Src) 97.8 F (36.6 C) (Oral)  Ht 5' (1.524 m)  Wt 170 lb 9.6 oz (77.384 kg)  BMI 33.32 kg/m2  LMP 02/09/2008  Wt Readings from Last 3 Encounters:  07/11/14 170 lb 9.6 oz (77.384 kg)  05/23/14 160 lb (72.576 kg)  04/26/14 156 lb (70.761 kg)    Physical  Exam  Constitutional: She is oriented to person, place, and time. She appears well-developed and well-nourished.  Neck: Normal range of motion. Neck supple.  Cardiovascular: Normal rate and regular rhythm.   Neurological: She is oriented to person, place, and time.  Skin: Lesion noted. No bruising noted. There is erythema.      Results for orders placed or performed in visit on 09/24/13  Urine culture  Result Value Ref Range   Micro Text Report         SOURCE: CLEAN CATCH    ORGANISM 1                50,000 CFU/ML CITROBACTER FREUNDII   ANTIBIOTIC                    ORG#1     CEFAZOLIN                     R         CEFOXITIN                     R         CEFTRIAXONE                   S         CIPROFLOXACIN                 S         GENTAMICIN                    S         IMIPENEM                      S         LEVOFLOXACIN                  S         NITROFURANTOIN                S         TRIMETHOPRIM/SULFAMETHOXAZOLE R           Urinalysis, Complete  Result Value Ref Range   Color - urine Straw    Clarity - urine Clear    Glucose,UR Negative 0-75 mg/dL   Bilirubin,UR Negative NEGATIVE   Ketone Negative NEGATIVE   Specific Gravity 1.002  1.003-1.030   Blood Negative NEGATIVE   Ph 6.0 4.5-8.0   Protein Negative NEGATIVE   Nitrite Negative NEGATIVE   Leukocyte Esterase 1+ NEGATIVE   RBC,UR NONE SEEN 0-5 /HPF   WBC UR 19 /HPF 0-5 /HPF   Bacteria TRACE NONE SEEN   Squamous Epithelial 1 /HPF    Mucous PRESENT       Assessment & Plan:   Problem List Items Addressed This Visit    None    Visit Diagnoses    Hidradenitis axillaris    -  Primary    10 days of doxycline and clindamycin lotion. Warm compresses to the area. Consider dermatology or surgical referral for worsening.     Relevant Medications    doxycycline (VIBRA-TABS) 100 MG tablet    clindamycin (CLEOCIN-T) 1 % external solution    Other Relevant Orders    Wound culture       Meds ordered this  encounter  Medications  . doxycycline (VIBRA-TABS) 100 MG tablet    Sig: Take 1 tablet (100 mg total) by mouth 2 (two) times daily.    Dispense:  20 tablet    Refill:  0    Order Specific Question:  Supervising Provider    Answer:  Arlis Porta [phone]  . clindamycin (CLEOCIN-T) 1 % external solution    Sig: Apply topically 2 (two) times daily.    Dispense:  60 mL    Refill:  2    Order Specific Question:  Supervising Provider    Answer:  Arlis Porta [[id number]]      Follow up plan: Return if symptoms worsen or fail to improve, for worsening lesions.Marland Kitchen

## 2014-07-11 NOTE — Assessment & Plan Note (Signed)
Pt is not ready to quit at this time.  

## 2014-07-11 NOTE — Patient Instructions (Signed)
Please take antibiotics twice daily for 10 days. Apply clindamycin lotion twice daily for 10 days and as needed when new lesions arise. Apply warm compress to axilla to help lesions drain.  The anti-biotic can make you sensitive to the sun. Please wear sunscreen.

## 2014-07-15 ENCOUNTER — Other Ambulatory Visit: Payer: Self-pay | Admitting: Family Medicine

## 2014-07-15 DIAGNOSIS — A4902 Methicillin resistant Staphylococcus aureus infection, unspecified site: Secondary | ICD-10-CM

## 2014-07-15 DIAGNOSIS — Z22322 Carrier or suspected carrier of Methicillin resistant Staphylococcus aureus: Secondary | ICD-10-CM

## 2014-07-15 LAB — WOUND CULTURE

## 2014-07-15 NOTE — Progress Notes (Signed)
Quick Note:  Pt is MRSA positive. Cultured organism is not susceptible to current antibiotic. Options include linezolid, gentamin, and vancomcyin. Pt may need ID consult for MRSA positive. Will treat with mupirocin in the nose. Consider oral linezolid if insurance will approve for current infection. ______

## 2014-07-15 NOTE — Telephone Encounter (Signed)
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 She wants the reply thanks [employer]

## 2014-07-15 NOTE — Telephone Encounter (Signed)
LM to discuss wound culture. Positive for MRSA but it is resistant to her current antibiotic. Need to discuss regimen change.

## 2014-07-16 MED ORDER — LINEZOLID 600 MG PO TABS: 600.0000 mg | ORAL_TABLET | Freq: Two times a day (BID) | ORAL | Status: AC

## 2014-07-16 MED ORDER — MUPIROCIN 2 % EX OINT: 1.0000 "application " | TOPICAL_OINTMENT | Freq: Two times a day (BID) | CUTANEOUS | Status: AC

## 2014-07-16 NOTE — Telephone Encounter (Deleted)
If [employer] calls back. Please have the call routed to me. I need to discuss MRSA treatment and our antibiotic options to treat the current infection. I am sure we will need a prior authorization for my antibiotic options due to cost.

## 2014-07-16 NOTE — Telephone Encounter (Signed)
Spoke with Pt is not taking trazodone from last 6-8 months.

## 2014-07-16 NOTE — Telephone Encounter (Signed)
Spoke with patient. We will do linezolid and bactroban to clear MRSA. She will speak with her job about their policy regarding return to work with MRSA. However, trazodone and linezolid are contraindicated. Please confirm with patient if she is taking trazodone. If so, she needs to wait 24 days to start linezolid and resume trazodone 24 hours after. It is also advisable to avoid tramadol during treatment with linezolid. Can you please call her and get this information? Thanks! AK

## 2014-07-16 NOTE — Telephone Encounter (Signed)
Pt is not taking Trazodone at this time. No concerns about medication interaction. Pt has been taking tramadol sparingly and will avoid use while on linezolid.

## 2014-08-02 ENCOUNTER — Encounter: Payer: Self-pay | Admitting: Family Medicine

## 2014-08-19 ENCOUNTER — Encounter: Payer: 59 | Admitting: Family Medicine

## 2014-08-19 ENCOUNTER — Encounter: Payer: Self-pay | Admitting: Family Medicine

## 2014-08-19 ENCOUNTER — Other Ambulatory Visit: Payer: Self-pay | Admitting: Family Medicine

## 2014-08-19 ENCOUNTER — Ambulatory Visit (INDEPENDENT_AMBULATORY_CARE_PROVIDER_SITE_OTHER): Payer: 59 | Admitting: Family Medicine

## 2014-08-19 DIAGNOSIS — L02412 Cutaneous abscess of left axilla: Secondary | ICD-10-CM | POA: Diagnosis not present

## 2014-08-19 MED ORDER — LIDOCAINE-EPINEPHRINE 2 %-1:100000 IJ SOLN
1.0000 mL | Freq: Once | INTRAMUSCULAR | Status: AC
  Administered 2014-08-19: 1 mL via INTRADERMAL

## 2014-08-19 NOTE — Patient Instructions (Addendum)
Wound care: Apply triple ointment and change bandage once daily.  We will call with the results of your wound culture. I felt multiple abscesses in your axilla today. We may have to send you to see a surgeon if it does not resolve with antibiotics.   Abscess An abscess is an infected area that contains a collection of pus and debris.It can occur in almost any part of the body. An abscess is also known as a furuncle or boil. CAUSES  An abscess occurs when tissue gets infected. This can occur from blockage of oil or sweat glands, infection of hair follicles, or a minor injury to the skin. As the body tries to fight the infection, pus collects in the area and creates pressure under the skin. This pressure causes pain. People with weakened immune systems have difficulty fighting infections and get certain abscesses more often.  SYMPTOMS Usually an abscess develops on the skin and becomes a painful mass that is red, warm, and tender. If the abscess forms under the skin, you may feel a moveable soft area under the skin. Some abscesses break open (rupture) on their own, but most will continue to get worse without care. The infection can spread deeper into the body and eventually into the bloodstream, causing you to feel ill.  DIAGNOSIS  Your caregiver will take your medical history and perform a physical exam. A sample of fluid may also be taken from the abscess to determine what is causing your infection. TREATMENT  Your caregiver may prescribe antibiotic medicines to fight the infection. However, taking antibiotics alone usually does not cure an abscess. Your caregiver may need to make a small cut (incision) in the abscess to drain the pus. In some cases, gauze is packed into the abscess to reduce pain and to continue draining the area. HOME CARE INSTRUCTIONS   Only take over-the-counter or prescription medicines for pain, discomfort, or fever as directed by your caregiver.  If you were prescribed  antibiotics, take them as directed. Finish them even if you start to feel better.  If gauze is used, follow your caregiver's directions for changing the gauze.  To avoid spreading the infection:  Keep your draining abscess covered with a bandage.  Wash your hands well.  Do not share personal care items, towels, or whirlpools with others.  Avoid skin contact with others.  Keep your skin and clothes clean around the abscess.  Keep all follow-up appointments as directed by your caregiver. SEEK MEDICAL CARE IF:   You have increased pain, swelling, redness, fluid drainage, or bleeding.  You have muscle aches, chills, or a general ill feeling.  You have a fever. MAKE SURE YOU:   Understand these instructions.  Will watch your condition.  Will get help right away if you are not doing well or get worse. Document Released: 11/04/2004 Document Revised: 07/27/2011 Document Reviewed: 04/09/2011 [facility] Patient Information 2015 [place], [place]. This information is not intended to replace advice given to you by your health care provider. Make sure you discuss any questions you have with your health care provider.

## 2014-08-19 NOTE — Progress Notes (Signed)
Subjective:    Patient ID: Sheryl Perez, female    DOB: 1960-12-29, 54 y.o.   MRN: [id number]  HPI: Sheryl Perez is a 54 y.o. female presenting on 08/19/2014 for No chief complaint on file.   HPI  Pt seen about 1 month ago for abcess of the armpit. She was treated for hidrenitis and found to have MRSA. Only suceptible to linezolid.  She completed course of antibiotics but 1 abscess never closed.  Painful tender lump under the L axilla, still draining purulent drainage. No fevers or chills at home. Pt was treated for MRSA with mupirocin intranasally.   Past Medical History  Diagnosis Date  . Depression   . Bipolar disorder   . Chronic UTI   . Other specified disease of hair and hair follicles   . Injury, other and unspecified, knee, leg, ankle, and foot   . Chest pain, unspecified   . Dysfunction of eustachian tube   . Unspecified symptom associated with female genital organs   . Rectal prolapse   . Enlargement of lymph nodes   . 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 Back injury   . Chronic low back pain 10/23/2013  . History of Bell's palsy Right    Current Outpatient Prescriptions on File Prior to Visit  Medication Sig  . Ascorbic Acid (VITAMIN C) 1000 MG tablet Take 1,000 mg by mouth daily.  . Calcium Citrate-Vitamin D (CALCIUM CITRATE + D PO) Take 1 capsule by mouth daily.  . calcium-vitamin D (OSCAL) 250-125 MG-UNIT per tablet Take 1 tablet by mouth daily.  . clindamycin (CLEOCIN-T) 1 % external solution Apply topically 2 (two) times daily.  . cyclobenzaprine (FLEXERIL) 5 MG tablet   . diazepam (VALIUM) 2 MG tablet TAKE 1 TABLET BY MOUTH 2 TIMES A DAY  . Ginkgo Biloba 120 MG CAPS Take 1 capsule by mouth daily.  . meloxicam (MOBIC) 15 MG tablet   . Multiple Vitamin (MULTIVITAMIN) tablet Take 1 tablet by mouth daily.  . naproxen (NAPROSYN) 500 MG tablet TAKE 1 TABLET BY MOUTH TWICE A DAY WITH A MEAL  . traMADol (ULTRAM) 50 MG tablet TAKE 2 TABLETS  BY MOUTH EVERY 6 HOURS AS NEEDED. **MUST LAST 28 DAYS**   No current facility-administered medications on file prior to visit.    Review of Systems  Constitutional: Negative for fever and chills.  Respiratory: Negative for chest tightness, shortness of breath and wheezing.   Cardiovascular: Negative for chest pain and palpitations.  Skin: Positive for wound.   Per HPI unless specifically indicated above     Objective:    LMP 02/09/2008  Wt Readings from Last 3 Encounters:  08/19/14 166 lb 6.4 oz (75.479 kg)  07/11/14 170 lb 9.6 oz (77.384 kg)  05/23/14 160 lb (72.576 kg)    Physical Exam  Constitutional: She appears well-developed and well-nourished. No distress.  Cardiovascular: Normal rate.  Exam reveals no gallop and no friction rub.   No murmur heard. Pulmonary/Chest: Effort normal and breath sounds normal.  Skin: Lesion noted. She is not diaphoretic.  L axilla draining lesion. Purulent drainage from open area. Multiple other lesions felt along the L axilla.      I&D  Meds, vitals, and allergies reviewed.   Indication: suspect abscess  Pt complaints of: erythema, pain, swelling  Location: L axilla Size: approx 2 cm in diameter/  Verbal informed consent obtained.  Pt aware of risks not limited to but including infection, bleeding,  damage to near by organs.  Prep: etoh/betadine  Anesthesia: 1%lidocaine with epi, good effect  Incision made with #11 blade  Wound explored- no purulent drainage removed.  Wound packed and triple ointment applied.   Tolerated well  Routine postprocedure instructions d/w pt- remove packing in 24-48h, keep area clean and bandaged, follow up if concerns/spreading erythema/pain.  Results for orders placed or performed in visit on 07/11/14  Wound culture  Result Value Ref Range   Gram Stain Result Final report    Result 1 Comment    RESULT 2 Comment    RESULT 3 Comment    Aerobic Bacterial Culture Final report (A)    Result 1  Staphylococcus aureus (A)    Result 2 Comment (A)    ANTIMICROBIAL SUSCEPTIBILITY Comment       Assessment & Plan:   Problem List Items Addressed This Visit    None    Visit Diagnoses    Abscess of left axilla    -  Primary    Wound culture. Attempted I and D- unable to get much drainage. There are multiple lesions felt under the skin. Suggested a surgical consult- pt would like to treat with antibiotics.  I and D done: Tolerated well  Routine postprocedure instructions d/w pt- remove packing in 24-48h, keep area clean and bandaged, follow up if concerns/spreading erythema/pain.     Relevant Orders    Wound culture       No orders of the defined types were placed in this encounter.      Follow up plan: Return if symptoms worsen or fail to improve.

## 2014-08-19 NOTE — Progress Notes (Signed)
This encounter was created in error - please disregard.

## 2014-08-21 ENCOUNTER — Encounter: Payer: Self-pay | Admitting: Family Medicine

## 2014-08-21 ENCOUNTER — Ambulatory Visit: Payer: 59 | Admitting: Family Medicine

## 2014-08-21 ENCOUNTER — Ambulatory Visit (INDEPENDENT_AMBULATORY_CARE_PROVIDER_SITE_OTHER): Payer: 59 | Admitting: Family Medicine

## 2014-08-21 VITALS — BP 120/81 | HR 91 | Temp 98.1°F | Resp 16 | Ht 60.0 in | Wt 165.0 lb

## 2014-08-21 DIAGNOSIS — L02412 Cutaneous abscess of left axilla: Secondary | ICD-10-CM

## 2014-08-21 NOTE — Progress Notes (Signed)
Subjective:    Patient ID: Sheryl Perez, female    DOB: [birth year], 54 y.o.   MRN: [id number]  HPI: Sheryl CONDRON is a 54 y.o. female presenting on 08/21/2014 for Arm Pain   HPI  Pt presents for wound check of her L arm I and D.  SHe has had persistent abscess of the L axilla.  WOund culture done on 7/11. Pt reports arm is sore but no longer draining.   Past Medical History  Diagnosis Date  . Depression   . Bipolar disorder   . Chronic UTI   . Other specified disease of hair and hair follicles   . Injury, other and unspecified, knee, leg, ankle, and foot   . Chest pain, unspecified   . Dysfunction of eustachian tube   . Unspecified symptom associated with female genital organs   . Rectal prolapse   . Enlargement of lymph nodes   . 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 Back injury   . Chronic low back pain 10/23/2013  . History of Bell's palsy Right    Current Outpatient Prescriptions on File Prior to Visit  Medication Sig  . Ascorbic Acid (VITAMIN C) 1000 MG tablet Take 1,000 mg by mouth daily.  . Calcium Citrate-Vitamin D (CALCIUM CITRATE + D PO) Take 1 capsule by mouth daily.  . calcium-vitamin D (OSCAL) 250-125 MG-UNIT per tablet Take 1 tablet by mouth daily.  . clindamycin (CLEOCIN-T) 1 % external solution Apply topically 2 (two) times daily.  . cyclobenzaprine (FLEXERIL) 5 MG tablet   . diazepam (VALIUM) 2 MG tablet TAKE 1 TABLET BY MOUTH 2 TIMES A DAY  . Ginkgo Biloba 120 MG CAPS Take 1 capsule by mouth daily.  . meloxicam (MOBIC) 15 MG tablet   . Multiple Vitamin (MULTIVITAMIN) tablet Take 1 tablet by mouth daily.  . naproxen (NAPROSYN) 500 MG tablet TAKE 1 TABLET BY MOUTH TWICE A DAY WITH A MEAL  . traMADol (ULTRAM) 50 MG tablet TAKE 2 TABLETS BY MOUTH EVERY 6 HOURS AS NEEDED. **MUST LAST 28 DAYS**   No current facility-administered medications on file prior to visit.    Review of Systems  Constitutional: Negative for fever and chills.   Respiratory: Negative for chest tightness and shortness of breath.   Cardiovascular: Negative for chest pain, palpitations and leg swelling.  Skin: Positive for wound.  Neurological: Negative for dizziness, syncope, weakness and numbness.   Per HPI unless specifically indicated above     Objective:    BP 120/81 mmHg  Pulse 91  Temp(Src) 98.1 F (36.7 C) (Oral)  Resp 16  Ht 5' (1.524 m)  Wt 165 lb (74.844 kg)  BMI 32.22 kg/m2  LMP 02/09/2008  Wt Readings from Last 3 Encounters:  08/21/14 165 lb (74.844 kg)  08/19/14 166 lb 6.4 oz (75.479 kg)  07/11/14 170 lb 9.6 oz (77.384 kg)    Physical Exam  Constitutional: She appears well-developed and well-nourished. No distress.  Skin: She is not diaphoretic.  L axilla incision. Clean, dry, no drainage. Applied triple ointment and bandage.    Results for orders placed or performed in visit on 07/11/14  Wound culture  Result Value Ref Range   Gram Stain Result Final report    Result 1 Comment    RESULT 2 Comment    RESULT 3 Comment    Aerobic Bacterial Culture Final report (A)    Result 1 Staphylococcus aureus (A)    Result 2 Comment (  A)    ANTIMICROBIAL SUSCEPTIBILITY Comment       Assessment & Plan:   Problem List Items Addressed This Visit    None    Visit Diagnoses    Abscess of left axilla    -  Primary    Wound check today. Wound is stable. Wound culture pending. Consider surgical referral if abscesses continue.        No orders of the defined types were placed in this encounter.      Follow up plan: Return if symptoms worsen or fail to improve.

## 2014-08-21 NOTE — Patient Instructions (Addendum)

## 2014-08-22 ENCOUNTER — Telehealth: Payer: Self-pay | Admitting: Family Medicine

## 2014-08-22 MED ORDER — AMOXICILLIN-POT CLAVULANATE 500-125 MG PO TABS: 1.0000 | ORAL_TABLET | Freq: Two times a day (BID) | ORAL | Status: DC

## 2014-08-22 NOTE — Telephone Encounter (Signed)
Called to review lab results with patient: Her wound culture did not show MRSA again. It is susceptible to amoxcillin. Per patient report she tolerates this medication well and can take with out issue. Treat twice daily for 10 days. If symptoms persist she will need to see surgery to have her abscess removed.

## 2014-08-23 LAB — WOUND CULTURE

## 2014-08-23 LAB — SPECIMEN STATUS REPORT

## 2014-08-30 ENCOUNTER — Ambulatory Visit: Payer: 59 | Admitting: Adult Health

## 2015-01-14 ENCOUNTER — Encounter: Payer: Self-pay | Admitting: Medical Oncology

## 2015-01-14 ENCOUNTER — Emergency Department
Admission: EM | Admit: 2015-01-14 | Discharge: 2015-01-15 | Disposition: A | Discharge status: Other Acute Inpatient Hospital | Payer: 59 | Attending: Emergency Medicine | Admitting: Emergency Medicine

## 2015-01-14 DIAGNOSIS — Z79899 Other long term (current) drug therapy: Secondary | ICD-10-CM | POA: Insufficient documentation

## 2015-01-14 DIAGNOSIS — G8929 Other chronic pain: Secondary | ICD-10-CM | POA: Diagnosis not present

## 2015-01-14 DIAGNOSIS — Z008 Encounter for other general examination: Secondary | ICD-10-CM | POA: Diagnosis present

## 2015-01-14 DIAGNOSIS — F1220 Cannabis dependence, uncomplicated: Secondary | ICD-10-CM

## 2015-01-14 DIAGNOSIS — F419 Anxiety disorder, unspecified: Secondary | ICD-10-CM | POA: Insufficient documentation

## 2015-01-14 DIAGNOSIS — F17210 Nicotine dependence, cigarettes, uncomplicated: Secondary | ICD-10-CM | POA: Diagnosis not present

## 2015-01-14 DIAGNOSIS — F3110 Bipolar disorder, current episode manic without psychotic features, unspecified: Secondary | ICD-10-CM

## 2015-01-14 DIAGNOSIS — M549 Dorsalgia, unspecified: Secondary | ICD-10-CM | POA: Diagnosis not present

## 2015-01-14 DIAGNOSIS — Z791 Long term (current) use of non-steroidal anti-inflammatories (NSAID): Secondary | ICD-10-CM | POA: Insufficient documentation

## 2015-01-14 DIAGNOSIS — M545 Low back pain, unspecified: Secondary | ICD-10-CM | POA: Diagnosis present

## 2015-01-14 DIAGNOSIS — Z792 Long term (current) use of antibiotics: Secondary | ICD-10-CM | POA: Diagnosis not present

## 2015-01-14 LAB — COMPREHENSIVE METABOLIC PANEL
ALT: 26 U/L (ref 14–54)
AST: 44 U/L — AB (ref 15–41)
Albumin: 4.3 g/dL (ref 3.5–5.0)
Alkaline Phosphatase: 99 U/L (ref 38–126)
Anion gap: 9 (ref 5–15)
BUN: 24 mg/dL — AB (ref 6–20)
CHLORIDE: 106 mmol/L (ref 101–111)
CO2: 23 mmol/L (ref 22–32)
CREATININE: 0.93 mg/dL (ref 0.44–1.00)
Calcium: 9.5 mg/dL (ref 8.9–10.3)
GFR calc Af Amer: >60 mL/min (ref >60)
Glucose, Bld: 109 mg/dL — ABNORMAL HIGH (ref 65–99)
POTASSIUM: 3.7 mmol/L (ref 3.5–5.1)
SODIUM: 138 mmol/L (ref 135–145)
Total Bilirubin: 0.5 mg/dL (ref 0.3–1.2)
Total Protein: 8 g/dL (ref 6.5–8.1)

## 2015-01-14 LAB — CBC
HCT: 39.3 % (ref 35.0–47.0)
HEMOGLOBIN: 13 g/dL (ref 12.0–16.0)
MCH: 30.6 pg (ref 26.0–34.0)
MCHC: 33.1 g/dL (ref 32.0–36.0)
MCV: 92.3 fL (ref 80.0–100.0)
PLATELETS: 341 10*3/uL (ref 150–440)
RBC: 4.26 MIL/uL (ref 3.80–5.20)
RDW: 13.2 % (ref 11.5–14.5)
WBC: 18.4 10*3/uL — AB (ref 3.6–11.0)

## 2015-01-14 LAB — URINALYSIS COMPLETE WITH MICROSCOPIC (ARMC ONLY)
BILIRUBIN URINE: NEGATIVE
GLUCOSE, UA: NEGATIVE mg/dL
KETONES UR: NEGATIVE mg/dL
LEUKOCYTES UA: NEGATIVE
NITRITE: NEGATIVE
PH: 6 (ref 5.0–8.0)
Protein, ur: 100 mg/dL — AB
SPECIFIC GRAVITY, URINE: 1.013 (ref 1.005–1.030)

## 2015-01-14 LAB — URINE DRUG SCREEN, QUALITATIVE (ARMC ONLY)
AMPHETAMINES, UR SCREEN: NOT DETECTED
Barbiturates, Ur Screen: NOT DETECTED
Benzodiazepine, Ur Scrn: NOT DETECTED
CANNABINOID 50 NG, UR ~~LOC~~: POSITIVE — AB
COCAINE METABOLITE, UR ~~LOC~~: NOT DETECTED
MDMA (ECSTASY) UR SCREEN: NOT DETECTED
Methadone Scn, Ur: NOT DETECTED
Opiate, Ur Screen: NOT DETECTED
Phencyclidine (PCP) Ur S: NOT DETECTED
TRICYCLIC, UR SCREEN: NOT DETECTED

## 2015-01-14 LAB — SALICYLATE LEVEL

## 2015-01-14 LAB — ETHANOL

## 2015-01-14 LAB — ACETAMINOPHEN LEVEL: Acetaminophen (Tylenol), Serum: <10 ug/mL — ABNORMAL LOW (ref 10–30)

## 2015-01-14 LAB — LITHIUM LEVEL

## 2015-01-14 MED ORDER — QUETIAPINE FUMARATE 25 MG PO TABS
100.0000 mg | ORAL_TABLET | ORAL | Status: AC
  Administered 2015-01-14: 100 mg via ORAL
  Filled 2015-01-14: qty 4

## 2015-01-14 MED ORDER — QUETIAPINE FUMARATE 25 MG PO TABS
100.0000 mg | ORAL_TABLET | Freq: Every day | ORAL | Status: DC
  Administered 2015-01-14: 100 mg via ORAL
  Filled 2015-01-14: qty 4

## 2015-01-14 MED ORDER — TRAMADOL HCL 50 MG PO TABS: 50.0000 mg | ORAL_TABLET | Freq: Four times a day (QID) | ORAL | Status: DC | PRN

## 2015-01-14 MED ORDER — LORAZEPAM 2 MG PO TABS
2.0000 mg | ORAL_TABLET | ORAL | Status: DC | PRN
  Administered 2015-01-14 (×2): 2 mg via ORAL
  Filled 2015-01-14 (×2): qty 1

## 2015-01-14 NOTE — ED Notes (Signed)
Pt. To ED-[facility] from ED ambulatory without difficulty, to room #4  . Report from RN. Pt. Is alert and oriented, warm and dry in no distress. Pt. Denies SI, HI, and AVH. Pt. Calm and cooperative. Pt. Made aware of security cameras and Q15 minute rounds. Pt. Encouraged to let Nursing staff know of any concerns or needs.
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
## 2015-01-14 NOTE — ED Notes (Signed)
Report received from [employer].  Pt in room. No complaints or concerns voiced at this time. No abnormal behavior noted at this time. Will continue to monitor with q15 min checks. ODS officer in area.

## 2015-01-14 NOTE — ED Notes (Signed)
Report called to [facility].  Pt to move to [facility] room 4

## 2015-01-14 NOTE — ED Notes (Signed)
BEHAVIORAL HEALTH ROUNDING Patient sleeping: No. Patient alert and oriented: yes Behavior appropriate: Yes.   Nutrition and fluids offered: Yes  Toileting and hygiene offered: Yes  Sitter present: q15 min observations  ENVIRONMENTAL ASSESSMENT Potentially harmful objects out of patient reach: Yes.   Personal belongings secured: Yes.   Patient dressed in hospital provided attire only: Yes.   Plastic bags out of patient reach: Yes.   Patient care equipment (cords, cables, call bells, lines, and drains) shortened, removed, or accounted for: Yes.   Equipment and supplies removed from bottom of stretcher: Yes.   Potentially toxic materials out of patient reach: Yes.   Sharps container removed or out of patient reach: Yes.   Law enforcement present: Yes [employer]

## 2015-01-14 NOTE — ED Notes (Signed)
MD at bedside. 

## 2015-01-14 NOTE — ED Notes (Signed)
Report received from Dani Gobble., RN. Pt. Alert and oriented in no distress denies SI, HI, AVH and pain.  Pt. Instructed to come to me with problems or concerns.Will continue to monitor for safety via security cameras and Q 15 minute checks.

## 2015-01-14 NOTE — ED Notes (Signed)
Pt with a visit from her spouse  - visitor was wanded upon arrival to the quad area  Visit observed by Audelia Acton EDT

## 2015-01-14 NOTE — ED Notes (Signed)
Pt ambulatory to triage with reports that she has been off of her Lithium, abilify and lamictal for a while and she has been diagnosed with Bipolar. Pt reports feeling manic. When pt left work last night pt told her co-workers that they may see her again but if not that she wanted to tell they good-bye. Pt denies SI currently but reports that she has had them in the past. Pt reports feeling anxious also.
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
## 2015-01-14 NOTE — ED Notes (Signed)
TTS at bedside. 

## 2015-01-15 ENCOUNTER — Encounter: Payer: Self-pay | Admitting: *Deleted

## 2015-01-15 ENCOUNTER — Inpatient Hospital Stay
Admission: EM | Admit: 2015-01-15 | Discharge: 2015-01-20 | DRG: 885 | Disposition: A | Discharge status: Home / Self Care | Payer: 59 | Source: Intra-hospital | Attending: Psychiatry | Admitting: Psychiatry

## 2015-01-15 DIAGNOSIS — F419 Anxiety disorder, unspecified: Secondary | ICD-10-CM | POA: Diagnosis present

## 2015-01-15 DIAGNOSIS — Z90711 Acquired absence of uterus with remaining cervical stump: Secondary | ICD-10-CM

## 2015-01-15 DIAGNOSIS — Z9889 Other specified postprocedural states: Secondary | ICD-10-CM

## 2015-01-15 DIAGNOSIS — Z9049 Acquired absence of other specified parts of digestive tract: Secondary | ICD-10-CM | POA: Diagnosis not present

## 2015-01-15 DIAGNOSIS — Z888 Allergy status to other drugs, medicaments and biological substances status: Secondary | ICD-10-CM | POA: Diagnosis not present

## 2015-01-15 DIAGNOSIS — Z8249 Family history of ischemic heart disease and other diseases of the circulatory system: Secondary | ICD-10-CM | POA: Diagnosis not present

## 2015-01-15 DIAGNOSIS — Z833 Family history of diabetes mellitus: Secondary | ICD-10-CM

## 2015-01-15 DIAGNOSIS — F3110 Bipolar disorder, current episode manic without psychotic features, unspecified: Secondary | ICD-10-CM | POA: Diagnosis not present

## 2015-01-15 DIAGNOSIS — Z820 Family history of epilepsy and other diseases of the nervous system: Secondary | ICD-10-CM | POA: Diagnosis not present

## 2015-01-15 DIAGNOSIS — G8929 Other chronic pain: Secondary | ICD-10-CM | POA: Diagnosis present

## 2015-01-15 DIAGNOSIS — Z882 Allergy status to sulfonamides status: Secondary | ICD-10-CM | POA: Diagnosis not present

## 2015-01-15 DIAGNOSIS — F17200 Nicotine dependence, unspecified, uncomplicated: Secondary | ICD-10-CM | POA: Diagnosis present

## 2015-01-15 DIAGNOSIS — Z803 Family history of malignant neoplasm of breast: Secondary | ICD-10-CM | POA: Diagnosis not present

## 2015-01-15 DIAGNOSIS — Z8371 Family history of colonic polyps: Secondary | ICD-10-CM | POA: Diagnosis not present

## 2015-01-15 DIAGNOSIS — F22 Delusional disorders: Secondary | ICD-10-CM | POA: Diagnosis present

## 2015-01-15 DIAGNOSIS — F1210 Cannabis abuse, uncomplicated: Secondary | ICD-10-CM | POA: Diagnosis present

## 2015-01-15 DIAGNOSIS — Z823 Family history of stroke: Secondary | ICD-10-CM

## 2015-01-15 DIAGNOSIS — F603 Borderline personality disorder: Secondary | ICD-10-CM | POA: Diagnosis present

## 2015-01-15 DIAGNOSIS — Z79899 Other long term (current) drug therapy: Secondary | ICD-10-CM

## 2015-01-15 DIAGNOSIS — F319 Bipolar disorder, unspecified: Principal | ICD-10-CM | POA: Diagnosis present

## 2015-01-15 DIAGNOSIS — M545 Low back pain, unspecified: Secondary | ICD-10-CM | POA: Diagnosis present

## 2015-01-15 DIAGNOSIS — F17210 Nicotine dependence, cigarettes, uncomplicated: Secondary | ICD-10-CM | POA: Diagnosis present

## 2015-01-15 DIAGNOSIS — F1220 Cannabis dependence, uncomplicated: Secondary | ICD-10-CM | POA: Diagnosis present

## 2015-01-15 LAB — LIPID PANEL
CHOL/HDL RATIO: 2.8 ratio
Cholesterol: 165 mg/dL (ref 0–200)
HDL: 59 mg/dL (ref >40)
LDL CALC: 70 mg/dL (ref 0–99)
Triglycerides: 178 mg/dL — ABNORMAL HIGH (ref <150)
VLDL: 36 mg/dL (ref 0–40)

## 2015-01-15 LAB — TSH: TSH: 2.343 u[IU]/mL (ref 0.350–4.500)

## 2015-01-15 LAB — HEMOGLOBIN A1C: Hgb A1c MFr Bld: 5.4 % (ref 4.0–6.0)

## 2015-01-15 MED ORDER — LORAZEPAM 2 MG PO TABS
2.0000 mg | ORAL_TABLET | ORAL | Status: DC | PRN
  Administered 2015-01-15: 2 mg via ORAL
  Filled 2015-01-15: qty 1

## 2015-01-15 MED ORDER — MAGNESIUM HYDROXIDE 400 MG/5ML PO SUSP: 30.0000 mL | Freq: Every day | ORAL | Status: DC | PRN

## 2015-01-15 MED ORDER — QUETIAPINE FUMARATE 100 MG PO TABS
100.0000 mg | ORAL_TABLET | Freq: Every day | ORAL | Status: DC
Start: 2015-01-15 — End: 2015-01-15

## 2015-01-15 MED ORDER — ALUM & MAG HYDROXIDE-SIMETH 200-200-20 MG/5ML PO SUSP
30.0000 mL | ORAL | Status: DC | PRN
Start: 2015-01-15 — End: 2015-01-20

## 2015-01-15 MED ORDER — QUETIAPINE FUMARATE 200 MG PO TABS
200.0000 mg | ORAL_TABLET | Freq: Every day | ORAL | Status: DC
  Administered 2015-01-15: 200 mg via ORAL
  Filled 2015-01-15: qty 1

## 2015-01-15 MED ORDER — TRAMADOL HCL 50 MG PO TABS
50.0000 mg | ORAL_TABLET | Freq: Four times a day (QID) | ORAL | Status: DC | PRN
  Administered 2015-01-15 – 2015-01-16 (×2): 50 mg via ORAL
  Filled 2015-01-15 (×3): qty 1

## 2015-01-15 MED ORDER — ACETAMINOPHEN 325 MG PO TABS
650.0000 mg | ORAL_TABLET | Freq: Four times a day (QID) | ORAL | Status: DC | PRN
  Administered 2015-01-16 – 2015-01-17 (×2): 650 mg via ORAL
  Filled 2015-01-15 (×2): qty 2

## 2015-01-15 NOTE — ED Notes (Signed)
Pt is brushing her teeth in the bathroom. She then came to nursing desk asking to make a phone call. Pt seems unable to sit still.

## 2015-01-15 NOTE — ED Notes (Signed)
Pt. Noted in room resting quietly;. No complaints or concerns voiced. No distress or abnormal behavior noted. Will continue to monitor with security cameras. Q 15 minute rounds continue. 

## 2015-01-15 NOTE — ED Notes (Signed)
Pt. Noted in the day room mumbling incoherently with being loud;. No complaints or concerns voiced. No distress or abnormal behavior noted. Will continue to monitor with security cameras. Q 15 minute rounds continue.

## 2015-01-15 NOTE — ED Notes (Signed)
Pt eating lunch

## 2015-01-15 NOTE — H&P (Signed)
Psychiatric Admission Assessment Adult  Patient Identification: Sheryl Perez MRN:  [id number] Date of Evaluation:  01/15/2015 Chief Complaint:  Bipolar Disorder Principal Diagnosis: Bipolar disorder, manic ([place]) Diagnosis:   Patient Active Problem List   Diagnosis Date Noted  . Bipolar disorder, manic ([place]) [F31.10] 01/15/2015  . Marijuana abuse [F12.10] 01/14/2015  . Tobacco use [Z72.0] 07/11/2014  . Chronic low back pain [M54.5, G89.29] 10/23/2013  . Borderline personality disorder [F60.3] 07/28/2012  . Bipolar I disorder with mania ([place]) [F31.10] 07/28/2012   History of Present Illness:: This is an intake admission for this 54 year old woman with a history of bipolar disorder. I saw the patient in the emergency room yesterday and admitted her at that time. Compared to yesterday the patient is a little bit better but still clearly manic. This patient presented with what sounds like several days of euphoric agitated mania with no sleep. Had not been on psychiatric medicines. Patient has been started back on Seroquel and has so far been compliant with medicine. She reports that her mood is still feeling labile and stood she is still showing some agitated confused behavior and labile mood. As far as her medical problems her pain is under reasonable control with the current tramadol. Associated Signs/Symptoms: Depression Symptoms:  psychomotor agitation, difficulty concentrating, (Hypo) Manic Symptoms:  Elevated Mood, Flight of Ideas, Grandiosity, Impulsivity, Irritable Mood, Labiality of Mood, Anxiety Symptoms:  Obsessive Compulsive Symptoms:   None,, Psychotic Symptoms:  Paranoia, PTSD Symptoms: Negative Total Time spent with patient: 1 hour  Past Psychiatric History: Patient has a past history of bipolar disorder going back years although it sounds like she has been able to go for long periods of time off medicine without symptoms. Prior manias with prior hospitalizations.  Questionable tolerance of medicines in the past. Also has a past history of some substance abuse. Not currently seeing anyone for outpatient treatment  Risk to Self: Is patient at risk for suicide?: No Risk to Others:   Prior Inpatient Therapy:   Prior Outpatient Therapy:    Alcohol Screening: 1. How often do you have a drink containing alcohol?: Monthly or less 2. How many drinks containing alcohol do you have on a typical day when you are drinking?: 1 or 2 3. How often do you have six or more drinks on one occasion?: Never Preliminary Score: 0 9. Have you or someone else been injured as a result of your drinking?: No 10. Has a relative or friend or a doctor or another health worker been concerned about your drinking or suggested you cut down?: No Alcohol Use Disorder Identification Test Final Score (AUDIT): 1 Brief Intervention: AUDIT score less than 7 or less-screening does not suggest unhealthy drinking-brief intervention not indicated Substance Abuse History in the last 12 months:  Yes.   Consequences of Substance Abuse: Medical Consequences:  Bipolar disorder Previous Psychotropic Medications: Yes  Psychological Evaluations: Yes  Past Medical History:  Past Medical History  Diagnosis Date  . Depression   . Bipolar disorder ([place])   . Chronic UTI   . Other specified disease of hair and hair follicles   . Injury, other and unspecified, knee, leg, ankle, and foot   . Chest pain, unspecified   . Dysfunction of eustachian tube   . Unspecified symptom associated with female genital organs   . Rectal prolapse   . Enlargement of lymph nodes   . 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 Back injury   . Chronic low  back pain 10/23/2013  . History of Bell's palsy Right    Past Surgical History  Procedure Laterality Date  . Cholecystectomy  2002  . Foot surgery  2004    bunion  . Ganglion cyst excision  2002  . Partial hysterectomy  2010    uterus removed  . Bladder  tack  2010   Family History:  Family History  Problem Relation Age of Onset  . Parkinson's disease Maternal Grandfather   . Diabetes Father   . Cancer Father     brain tumor  . Diabetes Paternal Grandfather   . Colon polyps Mother   . Heart disease Maternal Grandmother   . Heart attack Maternal Grandmother   . Stroke Maternal Grandmother   . Breast cancer Paternal Aunt   . Breast cancer Cousin    Family Psychiatric  History: Patient reports no family history of psychiatric illness that she is aware of Social History:  History  Alcohol Use  . 0.6 oz/week  . 1 Glasses of wine, 0 Standard drinks or equivalent per week    Comment: occasional     History  Drug Use  . Yes  . Special: Marijuana    Social History   Social History  . Marital Status: Married    Spouse Name: N/A  . Number of Children: 3  . Years of Education: hs   Occupational History  . [place]   Social History Main Topics  . Smoking status: Current Every Day Smoker -- 1.00 packs/day for 40 years    Types: Cigarettes  . Smokeless tobacco: Never Used  . Alcohol Use: 0.6 oz/week    1 Glasses of wine, 0 Standard drinks or equivalent per week     Comment: occasional  . Drug Use: Yes    Special: Marijuana  . Sexual Activity: Not Asked   Other Topics Concern  . None   Social History Narrative   Patient lives at home with her husband [name]).   Patient works full time.   Education CNA    Right handed.   Caffeine Tea , Soda.   Additional Social History:                         Allergies:   Allergies  Allergen Reactions  . Other Other (See Comments)    PT states that anything with the sides effects "may cause rash" she cannot take due to her psoriasis, it will make the patches on her hand so thick she can't use her hands.   . Sulfa Antibiotics Swelling    Diarrhea,dizziness, tongue swelling  . Abilify [Aripiprazole]   . Cephalosporins Rash  . Erythromycin  Other (See Comments)    Abd. pain  . Risperdal [Risperidone]     tremors   Lab Results:  Results for orders placed or performed during the hospital encounter of 01/15/15 (from the past 48 hour(s))  Hemoglobin A1c     Status: None   Collection Time: 01/15/15  4:26 PM  Result Value Ref Range   Hgb A1c MFr Bld 5.4 4.0 - 6.0 %  Lipid panel, fasting     Status: Abnormal   Collection Time: 01/15/15  4:26 PM  Result Value Ref Range   Cholesterol 165 0 - 200 mg/dL   Triglycerides 178 (H) <150 mg/dL   HDL 59 >40 mg/dL   Total CHOL/HDL Ratio 2.8 RATIO   VLDL 36 0 - 40 mg/dL  LDL Cholesterol 70 0 - 99 mg/dL    Comment:        Total Cholesterol/HDL:CHD Risk Coronary Heart Disease Risk Table                     Men   Women  1/2 Average Risk   3.4   3.3  Average Risk       5.0   4.4  2 X Average Risk   9.6   7.1  3 X Average Risk  23.4   11.0        Use the calculated Patient Ratio above and the CHD Risk Table to determine the patient's CHD Risk.        ATP III CLASSIFICATION (LDL):  <100     mg/dL   Optimal  100-129  mg/dL   Near or Above                    Optimal  130-159  mg/dL   Borderline  160-189  mg/dL   High  >190     mg/dL   Very High   TSH     Status: None   Collection Time: 01/15/15  4:26 PM  Result Value Ref Range   TSH 2.343 0.350 - 4.500 uIU/mL    Metabolic Disorder Labs:  Lab Results  Component Value Date   HGBA1C 5.4 01/15/2015   No results found for: PROLACTIN Lab Results  Component Value Date   CHOL 165 01/15/2015   TRIG 178* 01/15/2015   HDL 59 01/15/2015   CHOLHDL 2.8 01/15/2015   VLDL 36 01/15/2015   LDLCALC 70 01/15/2015    Current Medications: Current Facility-Administered Medications  Medication Dose Route Frequency Provider Last Rate Last Dose  . acetaminophen (TYLENOL) tablet 650 mg  650 mg Oral Q6H PRN Gonzella Lex, MD      . alum & mag hydroxide-simeth (MAALOX/MYLANTA) 200-200-20 MG/5ML suspension 30 mL  30 mL Oral Q4H PRN Gonzella Lex, MD      . LORazepam (ATIVAN) tablet 2 mg  2 mg Oral Q4H PRN Gonzella Lex, MD      . magnesium hydroxide (MILK OF MAGNESIA) suspension 30 mL  30 mL Oral Daily PRN Gonzella Lex, MD      . QUEtiapine (SEROQUEL) tablet 200 mg  200 mg Oral QHS Gonzella Lex, MD      . traMADol (ULTRAM) tablet 50 mg  50 mg Oral Q6H PRN Gonzella Lex, MD   50 mg at 01/15/15 1647   PTA Medications: Prescriptions prior to admission  Medication Sig Dispense Refill Last Dose  . clindamycin (CLEOCIN-T) 1 % external solution Apply topically 2 (two) times daily. 60 mL 2 Taking  . diazepam (VALIUM) 2 MG tablet TAKE 1 TABLET BY MOUTH 2 TIMES A DAY 60 tablet 3 01/13/2015 at Unknown time  . naproxen (NAPROSYN) 500 MG tablet TAKE 1 TABLET BY MOUTH TWICE A DAY WITH A MEAL 60 tablet 3 01/13/2015 at Unknown time  . traMADol (ULTRAM) 50 MG tablet TAKE 2 TABLETS BY MOUTH EVERY 6 HOURS AS NEEDED. **MUST LAST 28 DAYS** 60 tablet 3 01/13/2015 at Unknown time    Musculoskeletal: Strength & Muscle Tone: within normal limits Gait & Station: normal Patient leans: N/A  Psychiatric Specialty Exam: Physical Exam  Nursing note and vitals reviewed. Constitutional: She appears well-developed and well-nourished.  HENT:  Head: Normocephalic and atraumatic.  Eyes: Conjunctivae are normal. Pupils are equal, round,  and reactive to light.  Neck: Normal range of motion.  Cardiovascular: Normal heart sounds.   Respiratory: Effort normal.  GI: Soft.  Musculoskeletal: Normal range of motion.  Neurological: She is alert.  Skin: Skin is warm and dry.  Psychiatric: Her affect is labile. Her speech is rapid and/or pressured and tangential. She is agitated. Thought content is paranoid. She expresses impulsivity. She exhibits abnormal recent memory.    Review of Systems  Constitutional: Negative.   HENT: Negative.   Eyes: Negative.   Respiratory: Negative.   Cardiovascular: Negative.   Gastrointestinal: Negative.    Musculoskeletal: Negative.   Skin: Negative.   Neurological: Negative.   Psychiatric/Behavioral: Negative for depression, suicidal ideas, hallucinations, memory loss and substance abuse. The patient is nervous/anxious. The patient does not have insomnia.     Blood pressure 123/93, pulse 96, temperature 98.2 F (36.8 C), temperature source Oral, resp. rate 18, height 5' (1.524 m), weight 74.855 kg (165 lb 0.4 oz), last menstrual period 02/09/2008, SpO2 98 %.Body mass index is 32.23 kg/(m^2).  General Appearance: Disheveled  Eye Sport and exercise psychologist::  Fair  Speech:  Pressured  Volume:  Increased  Mood:  Euthymic and Irritable  Affect:  Labile  Thought Process:  Disorganized  Orientation:  Full (Time, Place, and Person)  Thought Content:  Paranoid Ideation  Suicidal Thoughts:  No  Homicidal Thoughts:  No  Memory:  Immediate;   Fair Recent;   Fair Remote;   Fair  Judgement:  Impaired  Insight:  Shallow  Psychomotor Activity:  Increased  Concentration:  Fair  Recall:  [place] of Knowledge:Fair  Language: Good  Akathisia:  No  Handed:  Right  AIMS (if indicated):     Assets:  Desire for Improvement Housing Resilience Social Support  ADL's:  Intact  Cognition: WNL  Sleep:        Treatment Plan Summary: Daily contact with patient to assess and evaluate symptoms and progress in treatment, Medication management and Plan This far as her bipolar disorder I will be increasing the dose of her Seroquel to 200 mg at night. Reviewed this with patient and she agrees to the plan. Continue tramadol as a when necessary medicine for her pain. Continue daily evaluation of bipolar disorder. Continue 15 minute checks because of some dangerousness. Patient continues to be at some risk of harm to others and will need to be closely monitored to make sure she does not get into fights.  Observation Level/Precautions:  15 minute checks  Laboratory:  Chemistry Profile HbAIC UDS  Psychotherapy:  Daily  individual and group psychotherapy focused on improving insight and management of disease   Medications:  Currently Seroquel as main management for bipolar disorder along with tramadol for pain   Consultations:  None   Discharge Concerns:  Patient will need to have good outpatient follow-up with medication management   Estimated LOS: 3-5 days   Other:     I certify that inpatient services furnished can reasonably be expected to improve the patient's condition.   Adan Beal 12/7/20167:33 PM

## 2015-01-15 NOTE — Progress Notes (Signed)
Pt. is to be admitted to [facility] by Dr. Weber Cooks. Attending Physician will be Dr. Weber Cooks.  Pt. has been assigned to room 311B, by [facility] Charge Nurse Anderson Malta.  Intake Paper Work has been signed and placed on pt. chart. ER staff [facility] ER Sect.; Dr. Clearnce Hasten , ER MD; Amy Patient's [place] Patient [facility]) have been made aware of the admission.   01/15/2015 Con Memos, MS, [place], LPCA Therapeutic Triage Specialist

## 2015-01-15 NOTE — ED Notes (Signed)
Attempted to call report. Marcie Bal RN asked  RN call back in 30 minutes. The RN that will be taking Ms. Grimsrud is admitting another patient at this time.

## 2015-01-15 NOTE — Progress Notes (Signed)
Admission note: Patient is a 54 yo female that was brought in to [facility] ED accompanied with a coworker.  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  Patient states she was diagnosed with Bipolar 1 d/o several years ago.  She stopped taking her medications 2 years ago due to "dry mouth."  She states she is either elated or depressed and sad.  Patient is slightly agitated, anxious with rapid, pressured speech.  She has a difficult time following the questions that nursing staff asks.  She goes off on a tangent about other topics.  Patient states the coworker brought her in because she was hyperverbal, hyperactive with periods of euphoria or tearfulness.  Patient lives in [place] with her spouse whom she states is supportive.  She drinks alcohol occasionally and socially.  She states she was taking valium and when she couldn't get it, she started self-medicating with THC.  She denies any SI/HI/AVH.  Patient reports chronic back pain and left rotator cuff pain.  Patient has no other pertinent medical hx.  Patient was oriented to room and unit.  She is talkative and cooperative.

## 2015-01-15 NOTE — ED Notes (Signed)
Patient asleep in room. No noted distress or abnormal behavior. Will continue 15 minute checks and observation by security cameras for safety. 

## 2015-01-15 NOTE — ED Notes (Signed)
Pt asking if she could wear her own clothing. Security informed pt that is not possible until she is transferred to the Behavioral medicine unit downstairs. Pt was accepting. Q 15 min checks in progress. Monitored for safety.

## 2015-01-15 NOTE — BHH Group Notes (Signed)
[facility] Group Notes:  (Nursing/MHT/Case Management/Adjunct)  Date:  01/15/2015  Time:  10:08 PM  Type of Therapy:  Group Therapy  Participation Level:  Active   Participation Quality:  Appropriate  Affect:  Appropriate  Cognitive:  Appropriate  Insight:  Good  Engagement in Group:  Engaged  Modes of Intervention:  Discussion  Summary of Progress/Problems:  Kandis Fantasia 01/15/2015, 10:08 PM

## 2015-01-15 NOTE — ED Notes (Addendum)
Report called to [employer]. Pt to be transported downstairs.

## 2015-01-15 NOTE — BHH Suicide Risk Assessment (Signed)
[facility] Admission Suicide Risk Assessment   Nursing information obtained from:    Demographic factors:    Current Mental Status:    Loss Factors:    Historical Factors:    Risk Reduction Factors:    Total Time spent with patient: 1 hour Principal Problem: Bipolar disorder, manic ([place]) Diagnosis:   Patient Active Problem List   Diagnosis Date Noted  . Bipolar disorder, manic ([place]) [F31.10] 01/15/2015  . Marijuana abuse [F12.10] 01/14/2015  . Tobacco use [Z72.0] 07/11/2014  . Chronic low back pain [M54.5, G89.29] 10/23/2013  . Borderline personality disorder [F60.3] 07/28/2012  . Bipolar I disorder with mania ([place]) [F31.10] 07/28/2012     Continued Clinical Symptoms:  Alcohol Use Disorder Identification Test Final Score (AUDIT): 1 The "Alcohol Use Disorders Identification Test", Guidelines for Use in Primary Care, Second Edition.  World Pharmacologist [facility]). Score between 0-7:  no or low risk or alcohol related problems. Score between 8-15:  moderate risk of alcohol related problems. Score between 16-19:  high risk of alcohol related problems. Score 20 or above:  warrants further diagnostic evaluation for alcohol dependence and treatment.   CLINICAL FACTORS:   Bipolar Disorder:   Mixed State Chronic Pain   Musculoskeletal: Strength & Muscle Tone: within normal limits Gait & Station: normal Patient leans: N/A  Psychiatric Specialty Exam: Physical Exam  ROS  Blood pressure 123/93, pulse 96, temperature 98.2 F (36.8 C), temperature source Oral, resp. rate 18, height 5' (1.524 m), weight 74.855 kg (165 lb 0.4 oz), last menstrual period 02/09/2008, SpO2 98 %.Body mass index is 32.23 kg/(m^2).  General Appearance: Disheveled  Eye Sport and exercise psychologist::  Fair  Speech:  Garbled and Pressured  Volume:  Normal  Mood:  Euphoric  Affect:  Labile  Thought Process:  Disorganized  Orientation:  Full (Time, Place, and Person)  Thought Content:  Paranoid Ideation  Suicidal Thoughts:  No   Homicidal Thoughts:  No  Memory:  Immediate;   Fair Recent;   Fair Remote;   Fair  Judgement:  Poor  Insight:  Shallow  Psychomotor Activity:  Restlessness  Concentration:  Fair  Recall:  [place]: Fair  Akathisia:  No  Handed:  Right  AIMS (if indicated):     Assets:  Communication Skills Desire for Improvement Financial Resources/Insurance Housing Resilience Social Support  Sleep:     Cognition: WNL  ADL's:  Intact     COGNITIVE FEATURES THAT CONTRIBUTE TO RISK:  Polarized thinking    SUICIDE RISK:   Mild:  Suicidal ideation of limited frequency, intensity, duration, and specificity.  There are no identifiable plans, no associated intent, mild dysphoria and related symptoms, good self-control (both objective and subjective assessment), few other risk factors, and identifiable protective factors, including available and accessible social support.  PLAN OF CARE: Patient is not acutely expressing any suicidal ideation and did not come in was suicidal intent or plan although she had made a suicidal statement at work. Bipolar disorder does increase risk. Patient is on medication and will be treated until we can get mood stability and improvement in her thinking clarity and will then need follow-up outpatient treatment  Medical Decision Making:  Review of Psycho-Social Stressors (1), Established Problem, Worsening (2) and Review of Medication Regimen & Side Effects (2)  I certify that inpatient services furnished can reasonably be expected to improve the patient's condition.   Lucillie Kiesel 01/15/2015, 7:38 PM

## 2015-01-15 NOTE — ED Notes (Signed)
Pt has visitors. The 2 visitors came back separately for 15 min visits each. NT supervised the visits.

## 2015-01-15 NOTE — ED Notes (Signed)
Pt. Noted in room lying on the bed talking to herself;. No complaints or concerns voiced. No distress or abnormal behavior noted. Will continue to monitor with security cameras. Q 15 minute rounds continue.

## 2015-01-15 NOTE — ED Notes (Signed)
Patient lying in bed, resting. No noted distress or abnormal behaviors noted. Will continue 15 minute checks and observation by security camera for safety.

## 2015-01-15 NOTE — ED Provider Notes (Signed)
-----------------------------------------   7:19 AM on 01/15/2015 -----------------------------------------   Blood pressure 144/63, pulse 104, temperature 97.8 F (36.6 C), temperature source Oral, resp. rate 18, weight 165 lb (74.844 kg), last menstrual period 02/09/2008, SpO2 94 %.  The patient had no acute events since last update.  Calm and cooperative at this time.    The patient will be admitted to the behavioral health service once a bed becomes available.     Loney Hering, MD 01/15/15 [phone]

## 2015-01-15 NOTE — ED Notes (Addendum)
Pt was unable to sit still during RN assessment. Pt denies SI/HI and AVH. Pain in lower back and shoulder r/t an injury at work. Pt  demonstrated the way she was injured at work while also giving a detailed description of exactly what happened. RN asked why she had not been taken her home medications and the pt refused to answer. "Im not talking about all that garbage again. You can find a doctor and ask him if you want to know."   Pt did a lot of speaking, but never answered the RN's questions. Definite flight of ideas and disorganization. Pt was pleasant and cooperative.   Q 15 min checks in progress. Monitored for safety.

## 2015-01-15 NOTE — ED Notes (Signed)
Pt. Noted in room walking and mumbling;. No complaints or concerns voiced. Will continue to monitor with security cameras. Q 15 minute rounds continue.

## 2015-01-15 NOTE — ED Notes (Signed)
Pt got up to use the restroom. Pt had wet herself and was asking for a new pad and new pants. RN convinced pt to leave bathroom and go right into the shower. Pt cooperated.

## 2015-01-15 NOTE — Progress Notes (Signed)
Recreation Therapy Notes  Date: 12.07.16 Time: 3:00 pm Location: Craft Room  Group Topic: Self-esteem  Goal Area(s) Addresses:  Patient will be able to identify benefit of self-esteem. Patient will be able to identify ways to increase self-esteem.  Behavioral Response: Arrived late, Attentive, Interactive  Intervention: Self-Protrait  Activity: Patients were given blank face worksheets and instructed to draw their self-portrait of how they were feeling. Patients were then given construction paper and instructed to write their first name and something positive about themselves. Patients passed the papers around group to write positive traits about peers. Patients drew their self-portrait again after reading the positive comments from their peers.  Education: LRT educated patients on additional ways to increase their self-esteem.  Education Outcome: Acknowledges educated/In group clarification offered  Clinical Observations/Feedback: Patient arrived to group at approximately 3:32 pm. Patient contributed to group discussion.  Leonette Monarch, LRT/CTRS 01/15/2015 4:43 PM

## 2015-01-15 NOTE — BHH Group Notes (Signed)
[facility] LCSW Aftercare Discharge Planning Group Note   01/15/2015 3:49 PM  Participation Quality:  Did not attend.   [place] MSW, LCSWA

## 2015-01-15 NOTE — ED Notes (Signed)
Pt to be transported to Behavioral Medicine unit. Belongings will be sent with patient.

## 2015-01-16 MED ORDER — QUETIAPINE FUMARATE 200 MG PO TABS
200.0000 mg | ORAL_TABLET | Freq: Every day | ORAL | Status: DC
  Administered 2015-01-16: 200 mg via ORAL
  Filled 2015-01-16: qty 1

## 2015-01-16 MED ORDER — TRAMADOL HCL 50 MG PO TABS: 50.0000 mg | ORAL_TABLET | Freq: Three times a day (TID) | ORAL | Status: DC | PRN

## 2015-01-16 MED ORDER — QUETIAPINE FUMARATE 200 MG PO TABS: 200.0000 mg | ORAL_TABLET | Freq: Every day | ORAL | Status: DC

## 2015-01-16 MED ORDER — TRAMADOL HCL 50 MG PO TABS
50.0000 mg | ORAL_TABLET | Freq: Four times a day (QID) | ORAL | Status: DC | PRN
  Administered 2015-01-17 – 2015-01-20 (×7): 50 mg via ORAL
  Filled 2015-01-16 (×7): qty 1

## 2015-01-16 MED ORDER — LORAZEPAM 2 MG PO TABS
2.0000 mg | ORAL_TABLET | Freq: Three times a day (TID) | ORAL | Status: DC | PRN
  Administered 2015-01-17 – 2015-01-19 (×2): 2 mg via ORAL
  Filled 2015-01-16 (×2): qty 1

## 2015-01-16 MED ORDER — QUETIAPINE FUMARATE 100 MG PO TABS: 100.0000 mg | ORAL_TABLET | Freq: Every day | ORAL | Status: DC

## 2015-01-16 NOTE — Progress Notes (Signed)
Patient alert and oriented, patient speaks rapid, hyper, but pleasant, no s/s of distress, patient denies Si/HI/ or AVH. Patient did complain of back pain and was administered tramadol x one, and she states it did help. Patient also talked with nurse about her job, and how she feels like people think she is crazy just because she has a mood disorder, nurse listened and showed empathy.Nurse also talked to her about deep breathing and taking her time to respond to things thinking it thru first. Patient states she does feel safe.

## 2015-01-16 NOTE — Progress Notes (Signed)
[facility] MD Progress Note  01/16/2015 6:24 PM Sheryl Perez  MRN:  [id number] Subjective:  Follow-up this 54 year old woman with history of bipolar disorder who presented acutely manic. Today she reports that her mood is feeling better. She is speaking at a more reasonable rate and interacting in a more logical way. Stays on topic much better. She also tells me that the medicine is making her feel a little bit sleepy causing some blurred vision and a little bit of dizziness when she stood up but otherwise has been tolerable. Principal Problem: Bipolar disorder, manic ([place]) Diagnosis:   Patient Active Problem List   Diagnosis Date Noted  . Bipolar disorder, manic ([place]) [F31.10] 01/15/2015  . Marijuana abuse [F12.10] 01/14/2015  . Tobacco use [Z72.0] 07/11/2014  . Chronic low back pain [M54.5, G89.29] 10/23/2013  . Borderline personality disorder [F60.3] 07/28/2012  . Bipolar I disorder with mania ([place]) [F31.10] 07/28/2012   Total Time spent with patient: 25 minutes  Past Psychiatric History: Past history of bipolar disorder with the last episode several years ago. Had not been getting outpatient treatment recently  Past Medical History:  Past Medical History  Diagnosis Date  . Depression   . Bipolar disorder ([place])   . Chronic UTI   . Other specified disease of hair and hair follicles   . Injury, other and unspecified, knee, leg, ankle, and foot   . Chest pain, unspecified   . Dysfunction of eustachian tube   . Unspecified symptom associated with female genital organs   . Rectal prolapse   . Enlargement of lymph nodes   . 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 Back injury   . Chronic low back pain 10/23/2013  . History of Bell's palsy Right    Past Surgical History  Procedure Laterality Date  . Cholecystectomy  2002  . Foot surgery  2004    bunion  . Ganglion cyst excision  2002  . Partial hysterectomy  2010    uterus removed  . Bladder tack  2010   Family  History:  Family History  Problem Relation Age of Onset  . Parkinson's disease Maternal Grandfather   . Diabetes Father   . Cancer Father     brain tumor  . Diabetes Paternal Grandfather   . Colon polyps Mother   . Heart disease Maternal Grandmother   . Heart attack Maternal Grandmother   . Stroke Maternal Grandmother   . Breast cancer Paternal Aunt   . Breast cancer Cousin    Family Psychiatric  History: No known family history of mental health problems Social History:  History  Alcohol Use  . 0.6 oz/week  . 1 Glasses of wine, 0 Standard drinks or equivalent per week    Comment: occasional     History  Drug Use  . Yes  . Special: Marijuana    Social History   Social History  . Marital Status: Married    Spouse Name: N/A  . Number of Children: 3  . Years of Education: hs   Occupational History  . [employer]   Social History Main Topics  . Smoking status: Current Every Day Smoker -- 1.00 packs/day for 40 years    Types: Cigarettes  . Smokeless tobacco: Never Used  . Alcohol Use: 0.6 oz/week    1 Glasses of wine, 0 Standard drinks or equivalent per week     Comment: occasional  . Drug Use: Yes  Special: Marijuana  . Sexual Activity: Not Asked   Other Topics Concern  . None   Social History Narrative   Patient lives at home with her husband [name]).   Patient works full time.   Education CNA    Right handed.   Caffeine Tea , Soda.   Additional Social History:                         Sleep: Fair  Appetite:  Good  Current Medications: Current Facility-Administered Medications  Medication Dose Route Frequency Provider Last Rate Last Dose  . acetaminophen (TYLENOL) tablet 650 mg  650 mg Oral Q6H PRN Gonzella Lex, MD   650 mg at 01/16/15 1813  . alum & mag hydroxide-simeth (MAALOX/MYLANTA) 200-200-20 MG/5ML suspension 30 mL  30 mL Oral Q4H PRN Gonzella Lex, MD      . LORazepam (ATIVAN) tablet 2 mg  2 mg Oral TID  PRN Hildred Priest, MD      . magnesium hydroxide (MILK OF MAGNESIA) suspension 30 mL  30 mL Oral Daily PRN Gonzella Lex, MD      . QUEtiapine (SEROQUEL) tablet 200 mg  200 mg Oral QHS Hildred Priest, MD      . traMADol Veatrice Bourbon) tablet 50 mg  50 mg Oral Q6H PRN Hildred Priest, MD        Lab Results:  Results for orders placed or performed during the hospital encounter of 01/15/15 (from the past 48 hour(s))  Hemoglobin A1c     Status: None   Collection Time: 01/15/15  4:26 PM  Result Value Ref Range   Hgb A1c MFr Bld 5.4 4.0 - 6.0 %  Lipid panel, fasting     Status: Abnormal   Collection Time: 01/15/15  4:26 PM  Result Value Ref Range   Cholesterol 165 0 - 200 mg/dL   Triglycerides 178 (H) <150 mg/dL   HDL 59 >40 mg/dL   Total CHOL/HDL Ratio 2.8 RATIO   VLDL 36 0 - 40 mg/dL   LDL Cholesterol 70 0 - 99 mg/dL    Comment:        Total Cholesterol/HDL:CHD Risk Coronary Heart Disease Risk Table                     Men   Women  1/2 Average Risk   3.4   3.3  Average Risk       5.0   4.4  2 X Average Risk   9.6   7.1  3 X Average Risk  23.4   11.0        Use the calculated Patient Ratio above and the CHD Risk Table to determine the patient's CHD Risk.        ATP III CLASSIFICATION (LDL):  <100     mg/dL   Optimal  100-129  mg/dL   Near or Above                    Optimal  130-159  mg/dL   Borderline  160-189  mg/dL   High  >190     mg/dL   Very High   TSH     Status: None   Collection Time: 01/15/15  4:26 PM  Result Value Ref Range   TSH 2.343 0.350 - 4.500 uIU/mL    Physical Findings: AIMS: Facial and Oral Movements Muscles of Facial Expression: None, normal Lips and Perioral Area: None, normal Jaw:  None, normal Tongue: None, normal,Extremity Movements Upper (arms, wrists, hands, fingers): None, normal Lower (legs, knees, ankles, toes): None, normal, Trunk Movements Neck, shoulders, hips: None, normal, Overall Severity Severity of  abnormal movements (highest score from questions above): None, normal Incapacitation due to abnormal movements: None, normal Patient's awareness of abnormal movements (rate only patient's report): No Awareness, Dental Status Current problems with teeth and/or dentures?: Yes (several missing teeth) Does patient usually wear dentures?: No  CIWA:    COWS:     Musculoskeletal: Strength & Muscle Tone: within normal limits Gait & Station: normal Patient leans: N/A  Psychiatric Specialty Exam: Review of Systems  Constitutional: Negative.   HENT: Negative.   Eyes: Negative.   Respiratory: Negative.   Cardiovascular: Negative.   Gastrointestinal: Negative.   Musculoskeletal: Negative.   Skin: Negative.   Neurological: Positive for dizziness.  Psychiatric/Behavioral: Negative for depression, suicidal ideas, hallucinations, memory loss and substance abuse. The patient is nervous/anxious. The patient does not have insomnia.     Blood pressure 120/84, pulse 91, temperature 99 F (37.2 C), temperature source Oral, resp. rate 18, height 5' (1.524 m), weight 74.855 kg (165 lb 0.4 oz), last menstrual period 02/09/2008, SpO2 98 %.Body mass index is 32.23 kg/(m^2).  General Appearance: Casual  Eye Contact::  Good  Speech:  Clear and Coherent  Volume:  Normal  Mood:  Euthymic  Affect:  Appropriate  Thought Process:  Goal Directed  Orientation:  Full (Time, Place, and Person)  Thought Content:  Negative  Suicidal Thoughts:  No  Homicidal Thoughts:  No  Memory:  Immediate;   Good Recent;   Fair Remote;   Good  Judgement:  Fair  Insight:  Fair  Psychomotor Activity:  Normal  Concentration:  Fair  Recall:  [employer] of Knowledge:Fair  Language: Fair  Akathisia:  No  Handed:  Right  AIMS (if indicated):     Assets:  Desire for Improvement Financial Resources/Insurance Housing Physical Health Resilience Social Support  ADL's:  Intact  Cognition: WNL  Sleep:  Number of Hours: 6.45    Treatment Plan Summary: Daily contact with patient to assess and evaluate symptoms and progress in treatment, Medication management and Plan due to her complaints of side effects I will not increase the Seroquel dose tonight. Patient is encouraged to give it time for her to adapt to her medication. Supportive counseling completed. Continue current medicine. Treatment team discussed case. Patient will need outpatient treatment arranged at discharge. Medically he appears to be stable.  John Clapacs 01/16/2015, 6:24 PM

## 2015-01-16 NOTE — Progress Notes (Signed)
Recreation Therapy Notes  INPATIENT RECREATION THERAPY ASSESSMENT  Patient Details Name: Sheryl Perez MRN: [id number] DOB: 05-30-1960 Today's Date: 01/16/2015  Patient Stressors: Family, Death, Work, Other (Comment) (Daughter is stressful beacuse she hate's patient's husband and thinks patient should leave him; Dad died 40 year ago and mom dies one year ago; works as a Quarry manager, work isn't as stressful now that she is on light duty due to back and shoulder; being labeled )  Coping Skills:   Exercise, Art/Dance, Talking, Music, Sports  Personal Challenges: Concentration, Decision-Making, Self-Esteem/Confidence  Leisure Interests (2+):  [employer], Sports - Swimming, Individual - 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)  Awareness of Community Resources:  Yes  Community Resources:  Gym, Other (Comment) ([place])  Current Use: No  If no, Barriers?: Other (Comment) (Planning to start going to the gym in January)  Patient Strengths:  thankful to be her mom and dad's child, hoping children will be the same, Christian  Patient Identified Areas of Improvement:  Teeth, body, outward appearance  Current Recreation Participation:  Walk the dog  Patient Goal for Hospitalization:  To get better  [place] of Residence:  [place] of Residence:  [place]   Current SI (including self-harm):  No  Current HI:  No  Consent to Intern Participation: N/A   Leonette Monarch, LRT/CTRS 01/16/2015, 5:58 PM

## 2015-01-16 NOTE — BHH Group Notes (Signed)
[place] Group Notes:  (Nursing/MHT/Case Management/Adjunct)  Date:  01/16/2015  Time:  9:56 PM  Type of Therapy:  Evening Wrap-up Group  Participation Level:  Active  Participation Quality:  Appropriate  Affect:  Appropriate  Cognitive:  Alert  Insight:  Good  Engagement in Group:  Engaged  Modes of Intervention:  Discussion  Summary of Progress/Problems:  Levonne Spiller 01/16/2015, 9:56 PM

## 2015-01-16 NOTE — BHH Group Notes (Signed)
[facility] Group Notes:  (Nursing/MHT/Case Management/Adjunct)  Date:  01/16/2015  Time:  2:14 PM  Type of Therapy:  Psychoeducational Skills  Participation Level:  Did Not Attend  Celso Amy 01/16/2015, 2:14 PM

## 2015-01-16 NOTE — Tx Team (Signed)
Initial Interdisciplinary Treatment Plan   PATIENT STRESSORS: Health problems Loss of mother Marital or family conflict Medication change or noncompliance   PATIENT STRENGTHS: Ability for insight Active sense of humor Average or above average intelligence Communication skills General fund of knowledge Motivation for treatment/growth   PROBLEM LIST: Problem List/Patient Goals Date to be addressed Date deferred Reason deferred Estimated date of resolution  Bipolar disorder 12/16/14     Depression 12/16/14     Medication noncompliance "I want to get back on my meds" 12/16/14                                          DISCHARGE CRITERIA:  Ability to meet basic life and health needs Adequate post-discharge living arrangements Improved stabilization in mood, thinking, and/or behavior Motivation to continue treatment in a less acute level of care Reduction of life-threatening or endangering symptoms to within safe limits Verbal commitment to aftercare and medication compliance  PRELIMINARY DISCHARGE PLAN: Attend PHP/IOP Placement in alternative living arrangements Return to previous work or school arrangements  PATIENT/FAMIILY INVOLVEMENT: This treatment plan has been presented to and reviewed with the patient, Sheryl Perez, and/or family member.  The patient and family have been given the opportunity to ask questions and make suggestions.  Andres Vest L Parsells 01/16/2015, 12:13 AM

## 2015-01-16 NOTE — Plan of Care (Signed)
Problem: Alteration in mood Goal: STG-Patient is able to discuss feelings and issues (Patient is able to discuss feelings and issues leading to depression)  Outcome: Progressing Pt discusses goals and feelings with staff. Discusses issues with medication compliance  Problem: Ineffective individual coping Goal: STG: Patient will remain free from self harm Outcome: Progressing Pt remains free from harm  Problem: Diagnosis: Increased Risk For Suicide Attempt Goal: LTG-Patient Will Report Improved Mood and Deny Suicidal LTG (by discharge) Patient will report improved mood and deny suicidal ideation.  Outcome: Progressing Pt denies SI at this time

## 2015-01-16 NOTE — Progress Notes (Signed)
Recreation Therapy Notes  Date: 12.08.16 Time: 3:00 pm Location: Craft Room  Group Topic: Leisure Education/Coping Skills  Goal Area(s) Addresses:  Patient will identify things they are grateful for. Patient will identify how being grateful can influence decision making.  Behavioral Response: Did not attend  Intervention: Grateful Wheel  Activity: Patients were given an I Am Grateful For worksheet and instructed to list thing they are grateful for under each category.  Education: LRT educated patient on why it is important to think of thing they are grateful for.  Education Outcome: Patient did not attend group.  Clinical Observations/Feedback: Patient did not attend group.  Leonette Monarch, LRT/CTRS 01/16/2015 5:18 PM

## 2015-01-16 NOTE — BHH Group Notes (Signed)
[place] [facility] LCSW Group Therapy  01/16/2015 11:16 AM (Late entry for 01/15/2015)   Type of Therapy:  Group Therapy  Participation Level:  Did Not Attend  Modes of Intervention:  Discussion, Education, Socialization and Support  Summary of Progress/Problems: Balance in life: Patients will discuss the concept of balance and how it looks and feels to be unbalanced. Pt will identify areas in their life that is unbalanced and ways to become more balanced.    [place] MSW, [place]  01/16/2015, 11:16 AM

## 2015-01-16 NOTE — Tx Team (Signed)
Interdisciplinary Treatment Plan Update (Adult)  Date:  01/16/2015 Time Reviewed:  3:22 PM  Progress in Treatment: Attending groups: Yes. Participating in groups:  Yes. Taking medication as prescribed:  Yes. Tolerating medication:  Yes. Family/Significant othe contact made:  No, will contact:  if patient provides consent Patient understands diagnosis:  Yes. Discussing patient identified problems/goals with staff:  Yes. Medical problems stabilized or resolved:  Yes. Denies suicidal/homicidal ideation: Yes. Issues/concerns per patient self-inventory:  Yes. Other:  New problem(s) identified: No, Describe:  none reported  Discharge Plan or Barriers:Patient will stabilize on medications and discharge home with her husband and children and will need outpatient medication management services scheduled at discharge.   Reason for Continuation of Hospitalization: Mania Medication stabilization  Comments:will increase seroquel  Estimated length of stay: up to 7 days with expected discharge by Thursday 01/23/15  New goal(s):  Review of initial/current patient goals per problem list:   1.  Goal(s):participate in care planning  Met:  No  Target date:by discharge  2.  Goal (s):mania manageable with medication therapy  Met:  No  Target date: by discharge  Attendees: Physician:  Alethia Berthold, MD 12/8/20163:22 PM  Nursing:   Polly Cobia, RN 12/8/20163:22 PM  Other:  Carmell [place], [place] 12/8/20163:22 PM  Other:  12/8/20163:22 PM  Other:  12/8/20163:22 PM  Other:  12/8/20163:22 PM  Other:  12/8/20163:22 PM  Other:  12/8/20163:22 PM  Other:  12/8/20163:22 PM  Other:   12/8/20163:22 PM   Scribe for Treatment Team:   Keene Breath, MSW, [place]  01/16/2015, 3:22 PM

## 2015-01-16 NOTE — Progress Notes (Signed)
D: Pt in hallway upon approach. Denies SI/HI/AVH at this time. Pt c/o pain rated a 7 out of 10. 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 Pt is pleasant and cooperative. A: Emotional support and encouragement provided. Medications given as prescribed. q15 minute safety checks maintained. R: Pt remains free from harm.

## 2015-01-16 NOTE — BHH Group Notes (Signed)
[facility] LCSW Group Therapy  01/16/2015 3:31 PM  Type of Therapy:  Group Therapy  Participation Level:  Active  Participation Quality:  Appropriate and Attentive  Affect:  Appropriate  Cognitive:  Alert, Appropriate and Oriented  Insight:  Improving  Engagement in Therapy:  Improving  Modes of Intervention:  Socialization and Support  Summary of Progress/Problems: Patient attended and participated in group discussion. Patient introduced herself and shared that her 'super power' is "Jesus, my husband and my family". Patient was able to shared that she has been off of her medications and that is what is out of balance in her life and wants to get her medications balanced.   Keene Breath, MSW, LCSWA 01/16/2015, 3:31 PM

## 2015-01-17 MED ORDER — LORAZEPAM 1 MG PO TABS
1.0000 mg | ORAL_TABLET | Freq: Every day | ORAL | Status: DC
Start: 2015-01-17 — End: 2015-01-20
  Administered 2015-01-17 – 2015-01-19 (×3): 1 mg via ORAL
  Filled 2015-01-17 (×3): qty 1

## 2015-01-17 MED ORDER — QUETIAPINE FUMARATE 300 MG PO TABS
300.0000 mg | ORAL_TABLET | Freq: Every day | ORAL | Status: DC
  Administered 2015-01-17 – 2015-01-19 (×3): 300 mg via ORAL
  Filled 2015-01-17 (×3): qty 1

## 2015-01-17 NOTE — BHH Suicide Risk Assessment (Signed)
[facility] INPATIENT:  Family/Significant Other Suicide Prevention Education  Suicide Prevention Education:  Education Completed; Yuxi Panetti (husband) [phone] has been identified by the patient as the family member/significant other with whom the patient will be residing, and identified as the person(s) who will aid the patient in the event of a mental health crisis (suicidal ideations/suicide attempt).  With written consent from the patient, the family member/significant other has been provided the following suicide prevention education, prior to the and/or following the discharge of the patient.  The suicide prevention education provided includes the following:  Suicide risk factors  Suicide prevention and interventions  National Suicide Hotline telephone number  [facility] assessment telephone number  [facility] Emergency Assistance [place] and/or Residential Mobile Crisis Unit telephone number  Request made of family/significant other to:  Remove weapons (e.g., guns, rifles, knives), all items previously/currently identified as safety concern.    Remove drugs/medications (over-the-counter, prescriptions, illicit drugs), all items previously/currently identified as a safety concern.  The family member/significant other verbalizes understanding of the suicide prevention education information provided.  The family member/significant other agrees to remove the items of safety concern listed above.  Keene Breath, MSW, LCSWA 01/17/2015, 4:51 PM

## 2015-01-17 NOTE — BHH Counselor (Signed)
Adult Comprehensive Assessment  Patient ID: Sheryl Perez, female   DOB: 01/27/1961, 54 y.o.   MRN: [id number]  Information Source: Information source: Patient  Current Stressors:     Living/Environment/Situation:  Living Arrangements: Spouse/significant other, Children (son Sheryl Perez and his girlfriend who is pregnant) How long has patient lived in current situation?: 8 years What is atmosphere in current home: Comfortable  Family History:  Marital status: Married Number of Years Married: 26 What types of issues is patient dealing with in the relationship?: none Are you sexually active?: Yes What is your sexual orientation?: heterosexual Does patient have children?: Yes How many children?: 4 How is patient's relationship with their children?: 3 children, 1 step children and 2 grandchildren and 2 on the way  Childhood History:  By whom was/is the patient raised?: Both parents Description of patient's relationship with caregiver when they were a child: good relationship Patient's description of current relationship with people who raised him/her: good but mom passed away 1 year ago, 4 years ago Does patient have siblings?: Yes Number of Siblings: 2 Description of patient's current relationship with siblings: close at heart but busy working-1 sister and 1 broher Did patient suffer any verbal/emotional/physical/sexual abuse as a child?: No Did patient suffer from severe childhood neglect?: No Has patient ever been sexually abused/assaulted/raped as an adolescent or adult?: No Was the patient ever a victim of a crime or a disaster?: No Witnessed domestic violence?: No Has patient been effected by domestic violence as an adult?: No  Education:  Highest grade of school patient has completed: 12th Grade Currently a student?: No (planning ot go back to school for LPN and then RN) Name of school: n/a Learning disability?: No  Employment/Work Situation:   Employment situation:  Employed Where is patient currently employed?: [employer] How long has patient been employed?: 4 years What is the longest time patient has a held a job?: 9 years Where was the patient employed at that time?: [employer] Has patient ever been in the [employer]?: No Has patient ever served in combat?: No Did You Receive Any Psychiatric Treatment/Services While in Passenger transport manager?: No Are There Guns or Other Weapons in [place]?: No Are These Psychologist, educational?:  (n/a)  Financial Resources:   Financial resources: Income from employment, Income from spouse Does patient have a Programmer, applications or guardian?: No  Alcohol/Substance Abuse:   What has been your use of drugs/alcohol within the last 12 months?: denies Alcohol/Substance Abuse Treatment Hx: Denies past history  Social Support System:   Heritage manager System: Manufacturing engineer System: family, co workers and ths hospital Type of faith/religion: [name] How does patient's faith help to cope with current illness?: praying  Leisure/Recreation:   Leisure and Hobbies: walking, boating, fishing, canoeing, sew but dont have a sewing machine  Strengths/Needs:   What things does the patient do well?: everything, love my job, taing care of old people and their loved ones, love doing little things out of the ordinary In what areas does patient struggle / problems for patient: math, Advertising account planner  Discharge Plan:   Does patient have access to transportation?: Yes Will patient be returning to same living situation after discharge?: Yes Currently receiving community mental health services: No If no, would patient like referral for services when discharged?: Yes (What county?) (REferral to [place]) Does patient have financial barriers related to discharge medications?: No  Summary/Recommendations:  Patient is a married 54 yo Caucasian female admitted for mania due  to Bipolar disorder. Patient  works at [employer] as a Quarry manager and a coworker [name] [phone] recommended patient come to the hospital to get back on medications. Patient reports she quit taking her medications 2 years ago during a court trial as she felt that being on her medications would not help her during the trial. Patient is requesting a psychiatrist and a referral to [facility] [phone] for medication management and therapy. Patient lives with her husband Sheryl Perez [phone], her son Sheryl Perez, and his girlfriend who is pregnant and has a great family support and support from coworkers. Patient states she is a [place] and prays to cope as well as medication therapy to balance. Patient was cooperative during assessment but was anxious and reports she slept better when taking 2 medications for sleep. Patient plans to go back to school for LPN and RN but feels this hospitalization is a setback. Patient reports she has orders for regular PT treatment and needs this to be looked into and states Sheryl [phone] will be able to provide this information from [facility]. Patient is encouraged to participate in medication management, group therapy, and therapeutic milieu.     Keene Perez., MSW, Latanya Presser  [phone]  01/17/2015

## 2015-01-17 NOTE — BHH Group Notes (Signed)
[facility] LCSW Aftercare Discharge Planning Group Note   01/17/2015 2:19 PM  Participation Quality:  Participated appropriately  Mood/Affect:  Anxious  Thoughts of Suicide:  No Will you contract for safety?   NA  Current AVH:  No  Plan for Discharge/Comments:  Patient plans to discharge home with her husband and will have transportation from family at discharge once stabilized on medications. Patient shared that her SMART goal is to "get out as soon as possible. I'm here to get my meds regulated."  Transportation Means: family will transport  Supports:patient has family and will need mental health outpatient provider by discharge  Keene Breath, MSW, LCSWA

## 2015-01-17 NOTE — Progress Notes (Addendum)
D: Pt is awake and active in the milieu this evening. Pt mood is sad and her affect is flat. Pt denies SI/HI and AVH at this time. Pt behavior is appropriate on the unit and she is pleasant and cooperative with staff.  A: Writer provided emotional support and administered medications as prescribed.  R: Pt spent some time in the dayroom this evening, and went to bed following medication administration. Pt reports that she slept very well with Trazadone and Ativan combination.

## 2015-01-17 NOTE — BHH Group Notes (Signed)
[place] Group Notes:  (Nursing/MHT/Case Management/Adjunct)  Date:  01/17/2015  Time:  12:38 PM  Type of Therapy:  Psychoeducational Skills  Participation Level:  Did Not Attend   Adela Lank [facility] 01/17/2015, 12:38 PM

## 2015-01-17 NOTE — Progress Notes (Signed)
D:  Per pt self inventory pt reports sleeping good, appetite good, energy level normal, ability to pay attention good, rates depression at a 0 out of 10, hopelessness at a 0 out of 10, anxiety at a 0 out of 10, denies SI/HI/AVH, bright affect during interaction, goal today: discharge date insight, do what I am asked and be a good listener"    A:  Emotional support provided, Encouraged pt to continue with treatment plan and attend all group activities, q15 min checks maintained for safety.  R:  Pt is receptive, going to groups, pleasant with staff and other patients on the unit.

## 2015-01-17 NOTE — Progress Notes (Signed)
Recreation Therapy Notes  Date: 12.09.16 Time: 3:10 pm Location: Craft Room  Group Topic: Coping Skills  Goal Area(s) Addresses:  Patient will participate in coping skill. Patient will verbalize benefit of using art as a coping skill.  Behavioral Response: Attentive, Interactive  Intervention: Coloring  Activity: Patients were given coloring sheets and instructed to color and think about what emotions they were experiencing and what they are focused on.  Education: LRT educated patients on healthy coping skills.  Education Outcome: Acknowledges education/In group clarification offered  Clinical Observations/Feedback: Patient completed activity by coloring a coloring sheet. Patient left group at approximately 3:24 pm stating she had a tooth ache and wanted to get some medication. Patient returned to group at approximately 3:31 pm. Patient contributed to group discussion by stating what she was focused on while she was coloring, and why art is a good coping skill.  Leonette Monarch, LRT/CTRS 01/17/2015 4:54 PM

## 2015-01-17 NOTE — Plan of Care (Signed)
Problem: Ineffective individual coping Goal: LTG: Patient will report a decrease in negative feelings Outcome: Progressing Pt reports a decrease in negative feelings and denies SI  Problem: Diagnosis: Increased Risk For Suicide Attempt Goal: STG-Patient Will Comply With Medication Regime Outcome: Progressing Pt taking medications as prescribed

## 2015-01-17 NOTE — BHH Group Notes (Signed)
[place] LCSW Group Therapy   01/17/2015 1:15 PM   Type of Therapy: Group Therapy   Participation Level: Active   Participation Quality: Attentive, Sharing and Supportive   Affect: Pleasant and calm  Cognitive: Alert and Oriented   Insight: Developing/Improving and Engaged   Engagement in Therapy: Developing/Improving and Engaged   Modes of Intervention: Clarification, Confrontation, Discussion, Education, Exploration, Limit-setting, Orientation, Problem-solving, Rapport Building, Art therapist, Socialization and Support   Summary of Progress/Problems: The topic for today was feelings about relapse. Pt discussed what relapse prevention is to them and identified triggers that they are on the path to relapse. Pt processed their feeling towards relapse and was able to relate to peers. Pt discussed coping skills that can be used for relapse prevention.  Pt presented initially as fatigued, but then began to share at length as the session progressed the pt's thoughts, feelings and reactions which preceded her relapse and then shared with the group her positive coping skill of being able to tell someone how she was feeling and ventilate her feelings.  The pt then identified calling others as a relapse prevention tool the pt can use and how she identified not "keeping things to myself" as a positive step in recovery now if it leads her to getting help.  Alphonse Guild. Soua Caltagirone, MSW, LCSWA, LCAS

## 2015-01-17 NOTE — Plan of Care (Signed)
Problem: [facility] Participation in Recreation Therapeutic Interventions Goal: STG-Patient will demonstrate improved self esteem by identif STG: Self-Esteem - Within 4 treatment sessions, patient will verbalize at least 5 positive affirmation statements in each of 2 treatment sessions to increase self-esteem post d/c.  Outcome: Progressing Treatment Session 1; Completed 1 out of 2; At approximately 4:00 pm, LRT met with patient in craft room. Patient verbalized 5 positive affirmation statements. Patient reported it felt "good". LRT encouraged patient to continue saying positive affirmation statements.  Intervention Used: I Am statements  Leonette Monarch, LRT/CTRS 12.09.16 5:17 pm Goal: STG-Other Recreation Therapy Goal (Specify) STG: Decision Making - Within 4 treatment sessions, patient will verbalize understanding of decision making charts in each of 2 treatment sessions to increase better decision making skills post d/c.  Outcome: Progressing Treatment Session 1; Completed 1 out of 2: At approximately 4:00 pm, LRT met with patient in craft room. LRT educated and provided patient with decision making charts. Patient verbalized understanding. LRT encouraged patient to use the charts to help her make better decisions. Intervention Used: Decision Making charts  Leonette Monarch, LRT/CTRS 12.09.16 5:18 pm

## 2015-01-17 NOTE — Progress Notes (Signed)
D: Pt is pleasant and cooperative. Denies SI/HI/AVH at this time. Denies pain. 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 No concerns or complaints at this time. A: Emotional support and encouragement provided. Medications given as prescribed. q15 minute safety checks maintained. R: Pt remains free from harm.

## 2015-01-17 NOTE — Progress Notes (Signed)
[facility] MD Progress Note  01/17/2015 11:06 PM Sheryl Perez  MRN:  [id number] Subjective: Follow-up for this patient with bipolar disorder. Today she appears to be significantly better. She denies any suicidal ideation. Her thinking is more clear. Speech is less pressured. Tolerating medicine adequately. Still complaining of chronic back pain. Principal Problem: Bipolar disorder, manic ([place]) Diagnosis:   Patient Active Problem List   Diagnosis Date Noted  . Bipolar disorder, manic ([place]) [F31.10] 01/15/2015  . Marijuana abuse [F12.10] 01/14/2015  . Tobacco use [Z72.0] 07/11/2014  . Chronic low back pain [M54.5, G89.29] 10/23/2013  . Borderline personality disorder [F60.3] 07/28/2012  . Bipolar I disorder with mania ([place]) [F31.10] 07/28/2012   Total Time spent with patient: 25 minutes  Past Psychiatric History: Past history of bipolar disorder with the last episode several years ago. Had not been getting outpatient treatment recently  Past Medical History:  Past Medical History  Diagnosis Date  . Depression   . Bipolar disorder ([place])   . Chronic UTI   . Other specified disease of hair and hair follicles   . Injury, other and unspecified, knee, leg, ankle, and foot   . Chest pain, unspecified   . Dysfunction of eustachian tube   . Unspecified symptom associated with female genital organs   . Rectal prolapse   . Enlargement of lymph nodes   . 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 Back injury   . Chronic low back pain 10/23/2013  . History of Bell's palsy Right    Past Surgical History  Procedure Laterality Date  . Cholecystectomy  2002  . Foot surgery  2004    bunion  . Ganglion cyst excision  2002  . Partial hysterectomy  2010    uterus removed  . Bladder tack  2010   Family History:  Family History  Problem Relation Age of Onset  . Parkinson's disease Maternal Grandfather   . Diabetes Father   . Cancer Father     brain tumor  . Diabetes Paternal  Grandfather   . Colon polyps Mother   . Heart disease Maternal Grandmother   . Heart attack Maternal Grandmother   . Stroke Maternal Grandmother   . Breast cancer Paternal Aunt   . Breast cancer Cousin    Family Psychiatric  History: No known family history of mental health problems Social History:  History  Alcohol Use  . 0.6 oz/week  . 1 Glasses of wine, 0 Standard drinks or equivalent per week    Comment: occasional     History  Drug Use  . Yes  . Special: Marijuana    Social History   Social History  . Marital Status: Married    Spouse Name: N/A  . Number of Children: 3  . Years of Education: hs   Occupational History  . [employer]   Social History Main Topics  . Smoking status: Current Every Day Smoker -- 1.00 packs/day for 40 years    Types: Cigarettes  . Smokeless tobacco: Never Used  . Alcohol Use: 0.6 oz/week    1 Glasses of wine, 0 Standard drinks or equivalent per week     Comment: occasional  . Drug Use: Yes    Special: Marijuana  . Sexual Activity: Not Asked   Other Topics Concern  . None   Social History Narrative   Patient lives at home with her husband [name]).   Patient works full time.  Education CNA    Right handed.   Caffeine Tea , Soda.   Additional Social History:                         Sleep: Fair  Appetite:  Good  Current Medications: Current Facility-Administered Medications  Medication Dose Route Frequency Provider Last Rate Last Dose  . acetaminophen (TYLENOL) tablet 650 mg  650 mg Oral Q6H PRN Gonzella Lex, MD   650 mg at 01/17/15 2111  . alum & mag hydroxide-simeth (MAALOX/MYLANTA) 200-200-20 MG/5ML suspension 30 mL  30 mL Oral Q4H PRN Gonzella Lex, MD      . LORazepam (ATIVAN) tablet 1 mg  1 mg Oral QHS Gonzella Lex, MD   1 mg at 01/17/15 2109  . LORazepam (ATIVAN) tablet 2 mg  2 mg Oral TID PRN Hildred Priest, MD   2 mg at 01/17/15 0843  . magnesium hydroxide (MILK  OF MAGNESIA) suspension 30 mL  30 mL Oral Daily PRN Gonzella Lex, MD      . QUEtiapine (SEROQUEL) tablet 300 mg  300 mg Oral QHS Gonzella Lex, MD   300 mg at 01/17/15 2109  . traMADol (ULTRAM) tablet 50 mg  50 mg Oral Q6H PRN Hildred Priest, MD   50 mg at 01/17/15 1528    Lab Results:  No results found for this or any previous visit (from the past 48 hour(s)).  Physical Findings: AIMS: Facial and Oral Movements Muscles of Facial Expression: None, normal Lips and Perioral Area: None, normal Jaw: None, normal Tongue: None, normal,Extremity Movements Upper (arms, wrists, hands, fingers): None, normal Lower (legs, knees, ankles, toes): None, normal, Trunk Movements Neck, shoulders, hips: None, normal, Overall Severity Severity of abnormal movements (highest score from questions above): None, normal Incapacitation due to abnormal movements: None, normal Patient's awareness of abnormal movements (rate only patient's report): No Awareness, Dental Status Current problems with teeth and/or dentures?: Yes (several missing teeth) Does patient usually wear dentures?: No  CIWA:    COWS:     Musculoskeletal: Strength & Muscle Tone: within normal limits Gait & Station: normal Patient leans: N/A  Psychiatric Specialty Exam: Review of Systems  Constitutional: Negative.   HENT: Negative.   Eyes: Negative.   Respiratory: Negative.   Cardiovascular: Negative.   Gastrointestinal: Negative.   Musculoskeletal: Negative.   Skin: Negative.   Neurological: Positive for dizziness.  Psychiatric/Behavioral: Negative for depression, suicidal ideas, hallucinations, memory loss and substance abuse. The patient is nervous/anxious. The patient does not have insomnia.     Blood pressure 152/84, pulse 99, temperature 98.9 F (37.2 C), temperature source Oral, resp. rate 20, height 5' (1.524 m), weight 74.855 kg (165 lb 0.4 oz), last menstrual period 02/09/2008, SpO2 98 %.Body mass index is  32.23 kg/(m^2).  General Appearance: Casual  Eye Contact::  Good  Speech:  Clear and Coherent  Volume:  Normal  Mood:  Euthymic  Affect:  Appropriate  Thought Process:  Goal Directed  Orientation:  Full (Time, Place, and Person)  Thought Content:  Negative  Suicidal Thoughts:  No  Homicidal Thoughts:  No  Memory:  Immediate;   Good Recent;   Fair Remote;   Good  Judgement:  Fair  Insight:  Fair  Psychomotor Activity:  Normal  Concentration:  Fair  Recall:  [employer] [place]  Language: Fair  Akathisia:  No  Handed:  Right  AIMS (if indicated):     Assets:  Desire for Improvement Financial Resources/Insurance Housing Physical Health Resilience Social Support  ADL's:  Intact  Cognition: WNL  Sleep:  Number of Hours: 7.15   Treatment Plan Summary: Daily contact with patient to assess and evaluate symptoms and progress in treatment, Medication management and Plan Continue Seroquel with gradual titration up to 300 mg. Explain the rationale the patient to agrees. Continue psychoeducation. Patient is requesting to have physical therapy consulted as she was getting physical therapy as an outpatient. We can go ahead and order that to have them reevaluate her. At her current rate she may be able to be discharged within the next couple days.  Yacoub Diltz 01/17/2015, 11:06 PM

## 2015-01-18 NOTE — BHH Group Notes (Signed)
[place] Group Notes:  (Nursing/MHT/Case Management/Adjunct)  Date:  01/18/2015  Time:  10:59 AM  Type of Therapy:  Psychoeducational Skills  Participation Level:  Active  Participation Quality:  Appropriate  Affect:  Appropriate  Cognitive:  Appropriate  Insight:  Appropriate  Engagement in Group:  Engaged and Supportive  Modes of Intervention:  Education and Support  Summary of Progress/Problems:  [employer] 01/18/2015, 10:59 AM

## 2015-01-18 NOTE — Plan of Care (Signed)
Problem: Diagnosis: Increased Risk For Suicide Attempt Goal: LTG-Patient Will Report Improved Mood and Deny Suicidal LTG (by discharge) Patient will report improved mood and deny suicidal ideation.  Outcome: Progressing Pt reports feeling better and denies SI

## 2015-01-18 NOTE — Progress Notes (Signed)
Denies SI/HI/AVH.  Pleasant and cooperative.  Hyper-verbal but states that she is not manic she just likes to talk and she is a lot better that what she was when she came in.  Medication and group compliant.   Safety maintained.

## 2015-01-18 NOTE — BHH Group Notes (Signed)
[place] Group Notes:  (Nursing/MHT/Case Management/Adjunct)  Date:  01/18/2015  Time:  9:13 PM  Type of Therapy:  Group Therapy  Participation Level:  Active  Participation Quality:  Appropriate  Affect:  Appropriate  Cognitive:  Appropriate  Insight:  Appropriate  Engagement in Group:  Engaged  Modes of Intervention:  Support  Summary of Progress/Problems:  Sheryl Perez 01/18/2015, 9:13 PM

## 2015-01-18 NOTE — BHH Group Notes (Signed)
[place] LCSW Group Therapy  01/18/2015 2:18 PM  Type of Therapy:  Group Therapy  Participation Level:  Active  Participation Quality:  Monopolizing, Redirectable and Sharing, supportive  Affect:  Excited, hyperverbal  Cognitive:  Appropriate  Insight:  Developing/Improving  Engagement in Therapy:  Engaged  Modes of Intervention:  Discussion, Education, Problem-solving, Socialization and Support  Summary of Progress/Problems: Reviewed poem, [employer].  Talked about finding purpose in emotions and challenges experienced.  Pt's identified who their support system will be at discharge and strategies they plan to use to stay focused on the good that can come from facing hardships.  Practiced statements, I may be________, but __________.  Pt filled in this statement with I may be bipolar, but I'm still loving.  August Saucer, MSW, LCSW 01/18/2015, 2:18 PM

## 2015-01-18 NOTE — Evaluation (Signed)
Physical Therapy Evaluation Patient Details Name: Sheryl Perez MRN: [id number] DOB: August 19, 1960 Today's Date: 01/18/2015   History of Present Illness  Pt here with bipolar issues, had been seeing out patient PT ([facility] in [place]) for RTC repair 7/22 and for chronic LBP.  Clinical Impression  Brief encounter as PT has chronic low back pain as well as current post L RTC repair rehab as an outpatient.  She has no issues with ambulation w/o AD showing good speed, confidence and general ability to do all she needs w/o issue or safety concerns.  We did discuss appropriate body mechanics, posture and positioning along with a discussion of HEP/stretches she can be doing per previous PT treatments.  Pt does not need further in-hospital PT but will benefit from continues outpatient care on d/c.     Follow Up Recommendations Outpatient PT (continue with shoulder/LBP treatment)    Equipment Recommendations  None recommended by PT    Recommendations for Other Services       Precautions / Restrictions Restrictions Weight Bearing Restrictions: No      Mobility  Bed Mobility Overal bed mobility: Independent                Transfers Overall transfer level: Independent                  Ambulation/Gait Ambulation/Gait assistance: Independent           General Gait Details: Pt walks with good confidence, has no guarding or hesitation and reports that her back is not hurting her too much at this time.   Stairs            Wheelchair Mobility    Modified Rankin (Stroke Patients Only)       Balance                                             Pertinent Vitals/Pain Pain Assessment:  (reports only very minimal back pain (1/10))    Home Living Family/patient expects to be discharged to:: Private residence Living Arrangements: Spouse/significant other;Children                    Prior Function Level of Independence: Independent          Comments: working as a Optician, dispensing        Extremity/Trunk Assessment   Upper Extremity Assessment: Overall WFL for tasks assessed (guarded L elevation secondary to RTC repair)           Lower Extremity Assessment: Overall WFL for tasks assessed         Communication   Communication: No difficulties  Cognition Arousal/Alertness: Awake/alert Behavior During Therapy: WFL for tasks assessed/performed Overall Cognitive Status: Within Functional Limits for tasks assessed                      General Comments      Exercises        Assessment/Plan    PT Assessment All further PT needs can be met in the next venue of care  PT Diagnosis  (lumbago)   PT Problem List Pain;Decreased mobility;Decreased strength;Decreased activity tolerance  PT Treatment Interventions     PT Goals (Current goals can be found in the Care Plan section) Acute Rehab PT Goals Patient Stated Goal: "Get my back and  my shoulder feeling better"    Frequency     Barriers to discharge        Co-evaluation               End of Session   Activity Tolerance: Patient tolerated treatment well Patient left:  (walking in hallway back to day room)      Functional Assessment Tool Used: clinical judgement Functional Limitation: Mobility: Walking and moving around Mobility: Walking and Moving Around Current Status [phone]): 0 percent impaired, limited or restricted Mobility: Walking and Moving Around Goal Status [phone]): 0 percent impaired, limited or restricted Mobility: Walking and Moving Around Discharge Status [phone]): 0 percent impaired, limited or restricted    Time: 1223-1234 PT Time Calculation (min) (ACUTE ONLY): 11 min   Charges:     PT Treatments $Therapeutic Exercise: 8-22 mins   PT G Codes:   PT G-Codes **NOT FOR INPATIENT CLASS** Functional Assessment Tool Used: clinical judgement Functional Limitation: Mobility: Walking and moving  around Mobility: Walking and Moving Around Current Status (A1224): 0 percent impaired, limited or restricted Mobility: Walking and Moving Around Goal Status (W0180): 0 percent impaired, limited or restricted Mobility: Walking and Moving Around Discharge Status [phone]): 0 percent impaired, limited or restricted   Wayne Both, PT, DPT [phone]  Kreg Shropshire 01/18/2015, 3:00 PM

## 2015-01-18 NOTE — Progress Notes (Signed)
[facility] MD Progress Note  01/18/2015 12:08 PM Sheryl Perez  MRN:  [id number] Subjective: Patient is a 54 year old female who was seen for follow-up. She appeared more calm and continues to be pressured. However patient reported that she has started improving on the medication and she takes all her medications at that time. She reported that she always talks fast. She reported that she has been improving on her current psychotropic medication. She has been tolerating the combination of current medications. 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 She denied having any adverse effects of the medication. She reported that she does not have any outpatient psychiatrist at this time but is looking forward to continue taking her medications    Principal Problem: Bipolar disorder, manic ([place]) Diagnosis:   Patient Active Problem List   Diagnosis Date Noted  . Bipolar disorder, manic ([place]) [F31.10] 01/15/2015  . Marijuana abuse [F12.10] 01/14/2015  . Tobacco use [Z72.0] 07/11/2014  . Chronic low back pain [M54.5, G89.29] 10/23/2013  . Borderline personality disorder [F60.3] 07/28/2012  . Bipolar I disorder with mania ([place]) [F31.10] 07/28/2012   Total Time spent with patient: 25 minutes  Past Psychiatric History: Past history of bipolar disorder with the last episode several years ago. Had not been getting outpatient treatment recently  Past Medical History:  Past Medical History  Diagnosis Date  . Depression   . Bipolar disorder ([place])   . Chronic UTI   . Other specified disease of hair and hair follicles   . Injury, other and unspecified, knee, leg, ankle, and foot   . Chest pain, unspecified   . Dysfunction of eustachian tube   . Unspecified symptom associated with female genital organs   . Rectal prolapse   . Enlargement of lymph nodes   . 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 Back injury    . Chronic low back pain 10/23/2013  . History of Bell's palsy Right    Past Surgical History  Procedure Laterality Date  . Cholecystectomy  2002  . Foot surgery  2004    bunion  . Ganglion cyst excision  2002  . Partial hysterectomy  2010    uterus removed  . Bladder tack  2010   Family History:  Family History  Problem Relation Age of Onset  . Parkinson's disease Maternal Grandfather   . Diabetes Father   . Cancer Father     brain tumor  . Diabetes Paternal Grandfather   . Colon polyps Mother   . Heart disease Maternal Grandmother   . Heart attack Maternal Grandmother   . Stroke Maternal Grandmother   . Breast cancer Paternal Aunt   . Breast cancer Cousin    Family Psychiatric  History: No known family history of mental health problems Social History:  History  Alcohol Use  . 0.6 oz/week  . 1 Glasses of wine, 0 Standard drinks or equivalent per week    Comment: occasional     History  Drug Use  . Yes  . Special: Marijuana    Social History   Social History  . Marital Status: Married    Spouse Name: N/A  . Number of Children: 3  . Years of Education: hs   Occupational History  . [employer]   Social History Main Topics  . Smoking status: Current Every Day Smoker -- 1.00 packs/day  for 40 years    Types: Cigarettes  . Smokeless tobacco: Never Used  . Alcohol Use: 0.6 oz/week    1 Glasses of wine, 0 Standard drinks or equivalent per week     Comment: occasional  . Drug Use: Yes    Special: Marijuana  . Sexual Activity: Not Asked   Other Topics Concern  . None   Social History Narrative   Patient lives at home with her husband Sheryl Perez).   Patient works full time.   Education CNA    Right handed.   Caffeine Tea , Soda.   Additional Social History:                         Sleep: Fair  Appetite:  Good  Current Medications: Current Facility-Administered Medications  Medication Dose Route Frequency Provider  Last Rate Last Dose  . acetaminophen (TYLENOL) tablet 650 mg  650 mg Oral Q6H PRN Gonzella Lex, MD   650 mg at 01/17/15 2111  . alum & mag hydroxide-simeth (MAALOX/MYLANTA) 200-200-20 MG/5ML suspension 30 mL  30 mL Oral Q4H PRN Gonzella Lex, MD      . LORazepam (ATIVAN) tablet 1 mg  1 mg Oral QHS Gonzella Lex, MD   1 mg at 01/17/15 2109  . LORazepam (ATIVAN) tablet 2 mg  2 mg Oral TID PRN Hildred Priest, MD   2 mg at 01/17/15 0843  . magnesium hydroxide (MILK OF MAGNESIA) suspension 30 mL  30 mL Oral Daily PRN Gonzella Lex, MD      . QUEtiapine (SEROQUEL) tablet 300 mg  300 mg Oral QHS Gonzella Lex, MD   300 mg at 01/17/15 2109  . traMADol (ULTRAM) tablet 50 mg  50 mg Oral Q6H PRN Hildred Priest, MD   50 mg at 01/17/15 1528    Lab Results:  No results found for this or any previous visit (from the past 48 hour(s)).  Physical Findings: AIMS: Facial and Oral Movements Muscles of Facial Expression: None, normal Lips and Perioral Area: None, normal Jaw: None, normal Tongue: None, normal,Extremity Movements Upper (arms, wrists, hands, fingers): None, normal Lower (legs, knees, ankles, toes): None, normal, Trunk Movements Neck, shoulders, hips: None, normal, Overall Severity Severity of abnormal movements (highest score from questions above): None, normal Incapacitation due to abnormal movements: None, normal Patient's awareness of abnormal movements (rate only patient's report): No Awareness, Dental Status Current problems with teeth and/or dentures?: Yes (several missing teeth) Does patient usually wear dentures?: No  CIWA:    COWS:     Musculoskeletal: Strength & Muscle Tone: within normal limits Gait & Station: normal Patient leans: N/A  Psychiatric Specialty Exam: Review of Systems  Constitutional: Negative.   HENT: Negative.   Eyes: Negative.   Respiratory: Negative.   Cardiovascular: Negative.   Gastrointestinal: Negative.   Musculoskeletal:  Negative.   Skin: Negative.   Neurological: Positive for dizziness.  Psychiatric/Behavioral: Negative for depression, suicidal ideas, hallucinations, memory loss and substance abuse. The patient is nervous/anxious. The patient does not have insomnia.     Blood pressure 124/88, pulse 90, temperature 97.5 F (36.4 C), temperature source Oral, resp. rate 20, height 5' (1.524 m), weight 165 lb 0.4 oz (74.855 kg), last menstrual period 02/09/2008, SpO2 98 %.Body mass index is 32.23 kg/(m^2).  General Appearance: Casual  Eye Contact::  Good  Speech:  Clear and Coherent  Volume:  Normal  Mood:  Euthymic  Affect:  Appropriate  Thought  Process:  Goal Directed  Orientation:  Full (Time, Place, and Person)  Thought Content:  Negative  Suicidal Thoughts:  No  Homicidal Thoughts:  No  Memory:  Immediate;   Good Recent;   Fair Remote;   Good  Judgement:  Fair  Insight:  Fair  Psychomotor Activity:  Normal  Concentration:  Fair  Recall:  [employer] of Knowledge:Fair  Language: Fair  Akathisia:  No  Handed:  Right  AIMS (if indicated):     Assets:  Desire for Improvement Financial Resources/Insurance Housing Physical Health Resilience Social Support  ADL's:  Intact  Cognition: WNL  Sleep:  Number of Hours: 7.15   Treatment Plan Summary:  Patient continues to take Seroquel 300 mg at bedtime. She is also taking lorazepam 1 mg at night. She is tolerating the medication well.     Rainey Pines 01/18/2015, 12:08 PM

## 2015-01-19 MED ORDER — QUETIAPINE FUMARATE 25 MG PO TABS
25.0000 mg | ORAL_TABLET | Freq: Every morning | ORAL | Status: DC
Start: 2015-01-19 — End: 2015-01-20
  Administered 2015-01-19 – 2015-01-20 (×2): 25 mg via ORAL
  Filled 2015-01-19 (×2): qty 1

## 2015-01-19 MED ORDER — ZIPRASIDONE MESYLATE 20 MG IM SOLR
10.0000 mg | Freq: Once | INTRAMUSCULAR | Status: DC
Start: 2015-01-19 — End: 2015-01-19

## 2015-01-19 NOTE — Progress Notes (Signed)
Pt has been pleasant and cooperative. Pt completed her self inventory. Pt denies SI and A/V hallucinations. Pt's mood and affect has been depressed. Pt has been a little  more active on the unit. Pt rated her depression 5/10, her hopelessness 4/10 and her anxiety 2/10. Pt states she feels good although she looks tired .

## 2015-01-19 NOTE — Progress Notes (Signed)
[facility] MD Progress Note  01/19/2015 11:19 AM Sheryl Perez  MRN:  [id number] Subjective: Patient is a 54 year old female who was seen for follow-up. She appeared more calm and reported that she has been responding to her medications. She reported that she was feeling anxious this morning and she was given a dose of lorazepam. She reported that sometimes she feels anxious in the morning and wants to have her medications adjusted. She is responding well to the combination of Seroquel and lorazepam. We discussed about her medications in detail. She is also looking forward to discharge tomorrow morning as she reported that she wants to start working again. She currently denied having any adverse effects of her medications..    Principal Problem: Bipolar disorder, manic ([place]) Diagnosis:   Patient Active Problem List   Diagnosis Date Noted  . Bipolar disorder, manic ([place]) [F31.10] 01/15/2015  . Marijuana abuse [F12.10] 01/14/2015  . Tobacco use [Z72.0] 07/11/2014  . Chronic low back pain [M54.5, G89.29] 10/23/2013  . Borderline personality disorder [F60.3] 07/28/2012  . Bipolar I disorder with mania ([place]) [F31.10] 07/28/2012   Total Time spent with patient: 25 minutes  Past Psychiatric History: Past history of bipolar disorder with the last episode several years ago. Had not been getting outpatient treatment recently  Past Medical History:  Past Medical History  Diagnosis Date  . Depression   . Bipolar disorder ([place])   . Chronic UTI   . Other specified disease of hair and hair follicles   . Injury, other and unspecified, knee, leg, ankle, and foot   . Chest pain, unspecified   . Dysfunction of eustachian tube   . Unspecified symptom associated with female genital organs   . Rectal prolapse   . Enlargement of lymph nodes   . 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 Back injury   . Chronic low back pain 10/23/2013  . History of Bell's palsy Right    Past Surgical History   Procedure Laterality Date  . Cholecystectomy  2002  . Foot surgery  2004    bunion  . Ganglion cyst excision  2002  . Partial hysterectomy  2010    uterus removed  . Bladder tack  2010   Family History:  Family History  Problem Relation Age of Onset  . Parkinson's disease Maternal Grandfather   . Diabetes Father   . Cancer Father     brain tumor  . Diabetes Paternal Grandfather   . Colon polyps Mother   . Heart disease Maternal Grandmother   . Heart attack Maternal Grandmother   . Stroke Maternal Grandmother   . Breast cancer Paternal Aunt   . Breast cancer Cousin    Family Psychiatric  History: No known family history of mental health problems Social History:  History  Alcohol Use  . 0.6 oz/week  . 1 Glasses of wine, 0 Standard drinks or equivalent per week    Comment: occasional     History  Drug Use  . Yes  . Special: Marijuana    Social History   Social History  . Marital Status: Married    Spouse Name: N/A  . Number of Children: 3  . Years of Education: hs   Occupational History  . [employer]   Social History Main Topics  . Smoking status: Current Every Day Smoker -- 1.00 packs/day for 40 years    Types: Cigarettes  . Smokeless tobacco: Never Used  .  Alcohol Use: 0.6 oz/week    1 Glasses of wine, 0 Standard drinks or equivalent per week     Comment: occasional  . Drug Use: Yes    Special: Marijuana  . Sexual Activity: Not Asked   Other Topics Concern  . None   Social History Narrative   Patient lives at home with her husband [name]).   Patient works full time.   Education CNA    Right handed.   Caffeine Tea , Soda.   Additional Social History:                         Sleep: Fair  Appetite:  Good  Current Medications: Current Facility-Administered Medications  Medication Dose Route Frequency Provider Last Rate Last Dose  . acetaminophen (TYLENOL) tablet 650 mg  650 mg Oral Q6H PRN Gonzella Lex,  MD   650 mg at 01/17/15 2111  . alum & mag hydroxide-simeth (MAALOX/MYLANTA) 200-200-20 MG/5ML suspension 30 mL  30 mL Oral Q4H PRN Gonzella Lex, MD      . LORazepam (ATIVAN) tablet 1 mg  1 mg Oral QHS Gonzella Lex, MD   1 mg at 01/18/15 2130  . magnesium hydroxide (MILK OF MAGNESIA) suspension 30 mL  30 mL Oral Daily PRN Gonzella Lex, MD      . QUEtiapine (SEROQUEL) tablet 25 mg  25 mg Oral q morning - 10a Rainey Pines, MD      . QUEtiapine (SEROQUEL) tablet 300 mg  300 mg Oral QHS Gonzella Lex, MD   300 mg at 01/18/15 2130  . traMADol (ULTRAM) tablet 50 mg  50 mg Oral Q6H PRN Hildred Priest, MD   50 mg at 01/19/15 [id number]    Lab Results:  No results found for this or any previous visit (from the past 48 hour(s)).  Physical Findings: AIMS: Facial and Oral Movements Muscles of Facial Expression: None, normal Lips and Perioral Area: None, normal Jaw: None, normal Tongue: None, normal,Extremity Movements Upper (arms, wrists, hands, fingers): None, normal Lower (legs, knees, ankles, toes): None, normal, Trunk Movements Neck, shoulders, hips: None, normal, Overall Severity Severity of abnormal movements (highest score from questions above): None, normal Incapacitation due to abnormal movements: None, normal Patient's awareness of abnormal movements (rate only patient's report): No Awareness, Dental Status Current problems with teeth and/or dentures?: Yes (several missing teeth) Does patient usually wear dentures?: No  CIWA:    COWS:     Musculoskeletal: Strength & Muscle Tone: within normal limits Gait & Station: normal Patient leans: N/A  Psychiatric Specialty Exam: Review of Systems  Constitutional: Negative.   HENT: Negative.   Eyes: Negative.   Respiratory: Negative.   Cardiovascular: Negative.   Gastrointestinal: Negative.   Musculoskeletal: Negative.   Skin: Negative.   Neurological: Positive for dizziness.  Psychiatric/Behavioral: Negative for  depression, suicidal ideas, hallucinations, memory loss and substance abuse. The patient is nervous/anxious. The patient does not have insomnia.     Blood pressure 119/78, pulse 112, temperature 97.5 F (36.4 C), temperature source Oral, resp. rate 20, height 5' (1.524 m), weight 165 lb 0.4 oz (74.855 kg), last menstrual period 02/09/2008, SpO2 98 %.Body mass index is 32.23 kg/(m^2).  General Appearance: Casual  Eye Contact::  Good  Speech:  Clear and Coherent  Volume:  Normal  Mood:  Euthymic  Affect:  Appropriate  Thought Process:  Goal Directed  Orientation:  Full (Time, Place, and Person)  Thought Content:  Negative  Suicidal Thoughts:  No  Homicidal Thoughts:  No  Memory:  Immediate;   Good Recent;   Fair Remote;   Good  Judgement:  Fair  Insight:  Fair  Psychomotor Activity:  Normal  Concentration:  Fair  Recall:  [employer] of Knowledge:Fair  Language: Fair  Akathisia:  No  Handed:  Right  AIMS (if indicated):     Assets:  Desire for Improvement Financial Resources/Insurance Housing Physical Health Resilience Social Support  ADL's:  Intact  Cognition: WNL  Sleep:  Number of Hours: 4   Treatment Plan Summary:  Patient continues to take Seroquel 300 mg at bedtime. She is also taking lorazepam 1 mg at night. She is tolerating the medication well. I started her on Seroquel 25 mg in the morning and patient agreed with the plan. She was looking forward to be discharged tomorrow.     Rainey Pines 01/19/2015, 11:19 AM

## 2015-01-19 NOTE — Progress Notes (Signed)
D: Pt is awake and active in the milieu this evening. Pt mood is appropriate and her affect is sad. Pt denies SI/HI and AVH at this time.   A: Writer provided emotional support and administered medications as prescribed.  R: Pt spending some time in the dayroom. She went to bed following medication administration.

## 2015-01-19 NOTE — Plan of Care (Signed)
Problem: Alteration in mood Goal: LTG-Patient reports reduction in suicidal thoughts (Patient reports reduction in suicidal thoughts and is able to verbalize a safety plan for whenever patient is feeling suicidal)  Outcome: Progressing Pt denies SI at this time Goal: LTG-Pt's behavior demonstrates decreased signs of depression (Patient's behavior demonstrates decreased signs of depression to the point the patient is safe to return home and continue treatment in an outpatient setting)  Outcome: Not Progressing Pt endorses increasing depression at this time

## 2015-01-19 NOTE — [facility] Group Notes (Signed)
[facility] Group Notes:  (Nursing/MHT/Case Management/Adjunct)  Date:  01/19/2015  Time:  9:54 PM  Type of Therapy:  Group Therapy  Participation Level:  Active  Participation Quality:  Appropriate  Affect:  Appropriate  Cognitive:  Appropriate  Insight:  Good  Engagement in Group:  Engaged  Modes of Intervention:  n/a  Summary of Progress/Problems:  Marylynn Pearson 01/19/2015, 9:54 PM

## 2015-01-19 NOTE — Progress Notes (Signed)
D: Pt denies SI/HI/AVH. Pt is pleasant and cooperative. Pt stated she was very depressed today, but trying to do better.   A: Pt was offered support and encouragement. Pt was given scheduled medications. Pt was encourage to attend groups. Q 15 minute checks were done for safety.   R:Pt attends groups and interacts well with peers and staff. Pt is taking medication. Pt has no complaints at this time .Pt receptive to treatment and safety maintained on unit.

## 2015-01-19 NOTE — Plan of Care (Signed)
Problem: Alteration in mood Goal: STG-Patient is able to discuss feelings and issues (Patient is able to discuss feelings and issues leading to depression)  Outcome: Progressing Pt is open with staff and communicates her needs as well as her optimism about discharge.

## 2015-01-19 NOTE — [facility] Group Notes (Signed)
[place] LCSW Group Therapy  01/19/2015 3:18 PM  Type of Therapy:  Group Therapy  Participation Level:  Minimal  Participation Quality:  Attentive  Affect:  Flat  Cognitive:  Alert  Insight:  Limited  Engagement in Therapy:  Limited  Modes of Intervention:  Discussion, Education, Socialization and Support  Summary of Progress/Problems: Todays topic: Grudges  Patients will be encouraged to discuss their thoughts, feelings, and behaviors as to why one holds on to grudges and reasons why people have grudges. Patients will process the impact of grudges on their daily lives and identify thoughts and feelings related to holding grudges. Patients will identify feelings and thoughts related to what life would look like without grudges. Pt attended group and stayed the entire time. Pt sat quietly and listened to other group members share.   [place] MSW, LCSWA  01/19/2015, 3:18 PM

## 2015-01-20 MED ORDER — QUETIAPINE FUMARATE 300 MG PO TABS
300.0000 mg | ORAL_TABLET | Freq: Every day | ORAL | Status: DC
Start: 2015-01-20 — End: 2015-05-21

## 2015-01-20 MED ORDER — LORAZEPAM 1 MG PO TABS: 1.0000 mg | ORAL_TABLET | Freq: Every day | ORAL | Status: DC

## 2015-01-20 MED ORDER — QUETIAPINE FUMARATE 25 MG PO TABS: 25.0000 mg | ORAL_TABLET | Freq: Every morning | ORAL | Status: DC

## 2015-01-20 NOTE — Discharge Summary (Signed)
Physician Discharge Summary Note  Patient:  Sheryl Perez is an 54 y.o., female MRN:  [id number] DOB:  [birth year] Patient phone:  [phone] (home)  Patient address:   [address] [place] [address],  Total Time spent with patient: 30 minutes  Date of Admission:  01/15/2015 Date of Discharge: 01/20/2015  Reason for Admission:  Bipolar mania.  History of Present Illness:: This is an intake admission for this 54 year old woman with a history of bipolar disorder. I saw the patient in the emergency room yesterday and admitted her at that time. Compared to yesterday the patient is a little bit better but still clearly manic. This patient presented with what sounds like several days of euphoric agitated mania with no sleep. Had not been on psychiatric medicines. Patient has been started back on Seroquel and has so far been compliant with medicine. She reports that her mood is still feeling labile and stood she is still showing some agitated confused behavior and labile mood. As far as her medical problems her pain is under reasonable control with the current tramadol. Associated Signs/Symptoms: Depression Symptoms: psychomotor agitation, difficulty concentrating, (Hypo) Manic Symptoms: Elevated Mood, Flight of Ideas, Grandiosity, Impulsivity, Irritable Mood, Labiality of Mood, Anxiety Symptoms: Obsessive Compulsive Symptoms: None,, Psychotic Symptoms: Paranoia, PTSD Symptoms: Negative  Past Psychiatric History: Patient has a past history of bipolar disorder going back years although it sounds like she has been able to go for long periods of time off medicine without symptoms. Prior manias with prior hospitalizations. Questionable tolerance of medicines in the past. Also has a past history of some substance abuse. Not currently seeing anyone for outpatient treatment   Principal Problem: Bipolar disorder, manic [facility]) Discharge Diagnoses: Patient Active Problem List   Diagnosis Date  Noted  . Bipolar disorder, manic ([place]) [F31.10] 01/15/2015  . Cannabis use disorder, moderate, dependence ([place]) [F12.20] 01/14/2015  . Tobacco use disorder [F17.200] 07/11/2014  . Chronic low back pain [M54.5, G89.29] 10/23/2013  . Borderline personality disorder [F60.3] 07/28/2012    Past Psychiatric History: Bipolar disorder.  Past Medical History:  Past Medical History  Diagnosis Date  . Depression   . Bipolar disorder ([place])   . Chronic UTI   . Other specified disease of hair and hair follicles   . Injury, other and unspecified, knee, leg, ankle, and foot   . Chest pain, unspecified   . Dysfunction of eustachian tube   . Unspecified symptom associated with female genital organs   . Rectal prolapse   . Enlargement of lymph nodes   . 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 Back injury   . Chronic low back pain 10/23/2013  . History of Bell's palsy Right    Past Surgical History  Procedure Laterality Date  . Cholecystectomy  2002  . Foot surgery  2004    bunion  . Ganglion cyst excision  2002  . Partial hysterectomy  2010    uterus removed  . Bladder tack  2010   Family History:  Family History  Problem Relation Age of Onset  . Parkinson's disease Maternal Grandfather   . Diabetes Father   . Cancer Father     brain tumor  . Diabetes Paternal Grandfather   . Colon polyps Mother   . Heart disease Maternal Grandmother   . Heart attack Maternal Grandmother   . Stroke Maternal Grandmother   . Breast cancer Paternal Aunt   . Breast cancer Cousin    Family Psychiatric  History: None reported. Social History:  History  Alcohol Use  . 0.6 oz/week  . 1 Glasses of wine, 0 Standard drinks or equivalent per week    Comment: occasional     History  Drug Use  . Yes  . Special: Marijuana    Social History   Social History  . Marital Status: Married    Spouse Name: N/A  . Number of Children: 3  . Years of Education: hs   Occupational History  .  [place]   Social History Main Topics  . Smoking status: Current Every Day Smoker -- 1.00 packs/day for 40 years    Types: Cigarettes  . Smokeless tobacco: Never Used  . Alcohol Use: 0.6 oz/week    1 Glasses of wine, 0 Standard drinks or equivalent per week     Comment: occasional  . Drug Use: Yes    Special: Marijuana  . Sexual Activity: Not Asked   Other Topics Concern  . None   Social History Narrative   Patient lives at home with her husband [name]).   Patient works full time.   Education CNA    Right handed.   Caffeine Tea , Soda.    Hospital Course:    Sheryl Perez is a 54 year old female with a history of bipolar disorder and substance use admitted for a manic episode.  1. Bipolar disorder. She was started on Seroquel with excellent results.  2. Anxiety. She was given Ativan in the evening and although Seroquel in the morning to address anxiety.  3. Back pain. She was continued on tramadol as in the community  4. Smoking. Nicotine products were available.   5. Cannabis use. The patient minimizes her problem and declines treatment.   6. Disposition. She Was discharged to home. She will follow up with [facility].   Physical Findings: AIMS: Facial and Oral Movements Muscles of Facial Expression: None, normal Lips and Perioral Area: None, normal Jaw: None, normal Tongue: None, normal,Extremity Movements Upper (arms, wrists, hands, fingers): None, normal Lower (legs, knees, ankles, toes): None, normal, Trunk Movements Neck, shoulders, hips: None, normal, Overall Severity Severity of abnormal movements (highest score from questions above): None, normal Incapacitation due to abnormal movements: None, normal Patient's awareness of abnormal movements (rate only patient's report): No Awareness, Dental Status Current problems with teeth and/or dentures?: Yes (several missing teeth) Does patient usually wear dentures?: No  CIWA:    COWS:      Musculoskeletal: Strength & Muscle Tone: within normal limits Gait & Station: normal Patient leans: N/A  Psychiatric Specialty Exam: Review of Systems  Musculoskeletal: Positive for back pain.  All other systems reviewed and are negative.   Blood pressure 122/77, pulse 102, temperature 98 F (36.7 C), temperature source Oral, resp. rate 20, height 5' (1.524 m), weight 74.855 kg (165 lb 0.4 oz), last menstrual period 02/09/2008, SpO2 98 %.Body mass index is 32.23 kg/(m^2).  See SRA.                                                  Sleep:  Number of Hours: 4.45   Have you used any form of tobacco in the last 30 days? (Cigarettes, Smokeless Tobacco, Cigars, and/or Pipes): Yes  Has this patient used any form of tobacco in the last 30 days? (Cigarettes, Smokeless Tobacco,  Cigars, and/or Pipes) Yes, Yes, A prescription for an FDA-approved tobacco cessation medication was offered at discharge and the patient refused  Metabolic Disorder Labs:  Lab Results  Component Value Date   HGBA1C 5.4 01/15/2015   No results found for: PROLACTIN Lab Results  Component Value Date   CHOL 165 01/15/2015   TRIG 178* 01/15/2015   HDL 59 01/15/2015   CHOLHDL 2.8 01/15/2015   VLDL 36 01/15/2015   LDLCALC 70 01/15/2015    See Psychiatric Specialty Exam and Suicide Risk Assessment completed by Attending Physician prior to discharge.  Discharge destination:  Home  Is patient on multiple antipsychotic therapies at discharge:  No   Has Patient had three or more failed trials of antipsychotic monotherapy by history:  No  Recommended Plan for Multiple Antipsychotic Therapies: NA  Discharge Instructions    Diet - low sodium heart healthy    Complete by:  As directed      Increase activity slowly    Complete by:  As directed             Medication List    STOP taking these medications        clindamycin 1 % external solution  Commonly known as:  CLEOCIN-T      diazepam 2 MG tablet  Commonly known as:  VALIUM      TAKE these medications      Indication   LORazepam 1 MG tablet  Commonly known as:  ATIVAN  Take 1 tablet (1 mg total) by mouth at bedtime.   Indication:  Feeling Anxious     naproxen 500 MG tablet  Commonly known as:  NAPROSYN  TAKE 1 TABLET BY MOUTH TWICE A DAY WITH A MEAL      QUEtiapine 25 MG tablet  Commonly known as:  SEROQUEL  Take 1 tablet (25 mg total) by mouth every morning.   Indication:  Manic Phase of Manic-Depression     QUEtiapine 300 MG tablet  Commonly known as:  SEROQUEL  Take 1 tablet (300 mg total) by mouth at bedtime.   Indication:  Manic Phase of Manic-Depression     traMADol 50 MG tablet  Commonly known as:  ULTRAM  TAKE 2 TABLETS BY MOUTH EVERY 6 HOURS AS NEEDED. **MUST LAST 28 DAYS**            Follow-up Information    Go to [facility].   Why:  For follow-up care appt   Contact information:   [address] [place], [place] [phone] Fax [phone]      Follow-up recommendations:  Activity:  As tolerated. Diet:  Low sodium heart healthy. Other:  Keep follow-up appointments.  Comments:    Signed: Jolanta Pucilowska 01/20/2015, 10:02 AM

## 2015-01-20 NOTE — Progress Notes (Signed)
D/C instructions/meds/follow-up appointments reviewed, pt verbalized understanding, pt's belongings returned to pt, denies SI/HI/AVH.

## 2015-01-20 NOTE — Plan of Care (Signed)
Problem: [facility] Participation in Recreation Therapeutic Interventions Goal: STG-Patient will demonstrate improved self esteem by identif STG: Self-Esteem - Within 4 treatment sessions, patient will verbalize at least 5 positive affirmation statements in each of 2 treatment sessions to increase self-esteem post d/c.  Outcome: Completed/Met Date Met:  01/20/15 Treatment Session 2; Completed 2 out of 2: At approximately 11:20 am, LRT met with patient in patient room. Patient verbalized 5 positive affirmation statements. Patient reported it felt "good". LRT encouraged patient to continue saying positive affirmation statements. Intervention Used: I Am statements  Leonette Monarch, LRT/CTRS 12.12.16 5:34 pm Goal: STG-Other Recreation Therapy Goal (Specify) STG: Decision Making - Within 4 treatment sessions, patient will verbalize understanding of decision making charts in each of 2 treatment sessions to increase better decision making skills post d/c.  Outcome: Completed/Met Date Met:  01/20/15 Treatment Session 2; Completed 2 out of 2: At approximately 11:20 am, LRT met with patient in patient room. Patient verbalized understanding of decision making charts. LRT encouraged patient to use charts to help her make better decisions. Intervention Used: Decision Making charts  Leonette Monarch, LRT/CTRS 12.12.16 5:36 pm

## 2015-01-20 NOTE — Progress Notes (Signed)
  [facility] Adult Case Management Discharge Plan :  Will you be returning to the same living situation after discharge:  Yes,  home with husband At discharge, do you have transportation home?: Yes,  husband to pick up Do you have the ability to pay for your medications: Yes,     Release of information consent forms completed and in the chart;  Patient's signature needed at discharge.  Patient to Follow up at: Follow-up Information    Go to [facility].   Why:  Walk-ins Monday-Friday between 9am-4pm., Hospital Follow up, Outpatient Medication Management, Therapy, Please take your photo ID and Insurance card   Contact information:   [place], [place] [phone] Fax [phone]      Next level of care provider has access to [facility] Link:no  Patient denies SI/HI: Yes    Safety Planning and Suicide Prevention discussed: Yes,     Have you used any form of tobacco in the last 30 days? (Cigarettes, Smokeless Tobacco, Cigars, and/or Pipes): Yes  Has patient been referred to the Quitline?: Patient refused referral  August Saucer, MSW, LCSW 01/20/2015, 10:58 AM

## 2015-01-20 NOTE — [facility] Suicide Risk Assessment (Signed)
[facility] Discharge Suicide Risk Assessment   Demographic Factors:  NA  Total Time spent with patient: 30 minutes  Musculoskeletal: Strength & Muscle Tone: within normal limits Gait & Station: normal Patient leans: N/A  Psychiatric Specialty Exam: Physical Exam  Nursing note and vitals reviewed.   Review of Systems  Musculoskeletal: Positive for back pain.  All other systems reviewed and are negative.   Blood pressure 122/77, pulse 102, temperature 98 F (36.7 C), temperature source Oral, resp. rate 20, height 5' (1.524 m), weight 74.855 kg (165 lb 0.4 oz), last menstrual period 02/09/2008, SpO2 98 %.Body mass index is 32.23 kg/(m^2).  General Appearance: Casual  Eye Contact::  Good  Speech:  Clear and [id number]  Volume:  Normal  Mood:  Euthymic  Affect:  Appropriate  Thought Process:  Goal Directed  Orientation:  Full (Time, Place, and Person)  Thought Content:  WDL  Suicidal Thoughts:  No  Homicidal Thoughts:  No  Memory:  Immediate;   Fair Recent;   Fair Remote;   Fair  Judgement:  Fair  Insight:  Fair  Psychomotor Activity:  Normal  Concentration:  Fair  Recall:  [employer] [place]  Language: Fair  Akathisia:  No  Handed:  Right  AIMS (if indicated):     Assets:  Agricultural consultant Housing Physical Health Resilience Social Support Vocational/Educational  Sleep:  Number of Hours: 4.45  Cognition: WNL  ADL's:  Intact   Have you used any form of tobacco in the last 30 days? (Cigarettes, Smokeless Tobacco, Cigars, and/or Pipes): Yes  Has this patient used any form of tobacco in the last 30 days? (Cigarettes, Smokeless Tobacco, Cigars, and/or Pipes) Yes, A prescription for an FDA-approved tobacco cessation medication was offered at discharge and the patient refused  Mental Status Per Nursing Assessment::   On Admission:     Current Mental Status by Physician: NA  Loss Factors: NA  Historical Factors: Prior  suicide attempts and Impulsivity  Risk Reduction Factors:   Sense of responsibility to family, Employed, Living with another person, especially a relative, Positive social support and Positive therapeutic relationship  Continued Clinical Symptoms:  Bipolar Disorder:   Mixed State Alcohol/Substance Abuse/Dependencies  Cognitive Features That Contribute To Risk:  None    Suicide Risk:  Minimal: No identifiable suicidal ideation.  Patients presenting with no risk factors but with morbid ruminations; may be classified as minimal risk based on the severity of the depressive symptoms  Principal Problem: Bipolar disorder, manic [facility]) Discharge Diagnoses:  Patient Active Problem List   Diagnosis Date Noted  . Bipolar disorder, manic ([place]) [F31.10] 01/15/2015  . Cannabis use disorder, moderate, dependence ([place]) [F12.20] 01/14/2015  . Tobacco use disorder [F17.200] 07/11/2014  . Chronic low back pain [M54.5, G89.29] 10/23/2013  . Borderline personality disorder [F60.3] 07/28/2012    Follow-up Information    Go to [facility].   Why:  For follow-up care appt   Contact information:   [address] [place], [place] [phone] Fax [phone]      Plan Of Care/Follow-up recommendations:  Activity:  As tolerated. Diet:  Low sodium heart healthy. Other:  Keep follow-up appointments.  Is patient on multiple antipsychotic therapies at discharge:  No   Has Patient had three or more failed trials of antipsychotic monotherapy by history:  No  Recommended Plan for Multiple Antipsychotic Therapies: NA    Jolanta Pucilowska 01/20/2015, 9:58 AM

## 2015-01-20 NOTE — Progress Notes (Signed)
Recreation Therapy Notes  INPATIENT RECREATION TR PLAN  Patient Details Name: Sheryl Perez MRN: [id number] DOB: 03-23-1960 Today's Date: 01/20/2015  Rec Therapy Plan Is patient appropriate for Therapeutic Recreation?: Yes Treatment times per week: At least once a week TR Treatment/Interventions: 1:1 session, Group participation (Comment) (Appropriate participation in daily recreation therapy tx)  Discharge Criteria Pt will be discharged from therapy if:: Treatment goals are met, Discharged Treatment plan/goals/alternatives discussed and agreed upon by:: Patient/family  Discharge Summary Short term goals set: See Care Plan Short term goals met: Complete Progress toward goals comments: One-to-one attended Which groups?: Coping skills, Self-esteem One-to-one attended: Self-esteem, decision making Reason goals not met: N/A Therapeutic equipment acquired: None Reason patient discharged from therapy: Discharge from hospital Pt/family agrees with progress & goals achieved: Yes Date patient discharged from therapy: 01/20/15   Leonette Monarch, LRT/CTRS 01/20/2015, 5:37 PM

## 2015-01-27 ENCOUNTER — Telehealth: Payer: Self-pay | Admitting: *Deleted

## 2015-01-27 NOTE — Telephone Encounter (Signed)
Patient states she had her urine dipped at work yesterday and she has a UTI. Patient wanted to come in today before 2pm b/c she has to go to work. We do not have any availability in our schedule to see her today. Patient want to know if she can come by and drop off a urine to be checked?

## 2015-01-27 NOTE — Telephone Encounter (Signed)
Patient will come in tomorrow 8am.

## 2015-01-27 NOTE — Telephone Encounter (Signed)
She can go to [facility] now, or I can see her tomorrow at 8 AM here to assess her for UTI.-jh

## 2015-01-28 ENCOUNTER — Ambulatory Visit (INDEPENDENT_AMBULATORY_CARE_PROVIDER_SITE_OTHER): Payer: 59 | Admitting: Family Medicine

## 2015-01-28 ENCOUNTER — Encounter: Payer: Self-pay | Admitting: Family Medicine

## 2015-01-28 VITALS — BP 127/78 | HR 92 | Temp 97.9°F | Resp 16 | Ht 60.0 in | Wt 182.0 lb

## 2015-01-28 DIAGNOSIS — R3589 Other polyuria: Secondary | ICD-10-CM

## 2015-01-28 DIAGNOSIS — R350 Frequency of micturition: Secondary | ICD-10-CM | POA: Diagnosis not present

## 2015-01-28 DIAGNOSIS — R358 Other polyuria: Secondary | ICD-10-CM

## 2015-01-28 LAB — POCT URINALYSIS DIPSTICK
Bilirubin, UA: NEGATIVE
Glucose, UA: NEGATIVE
Ketones, UA: NEGATIVE
Nitrite, UA: NEGATIVE
PROTEIN UA: 30
Spec Grav, UA: 1.01
UROBILINOGEN UA: NEGATIVE
pH, UA: 6.5

## 2015-01-28 MED ORDER — NITROFURANTOIN MONOHYD MACRO 100 MG PO CAPS: 100.0000 mg | ORAL_CAPSULE | Freq: Two times a day (BID) | ORAL | Status: DC

## 2015-01-28 NOTE — Progress Notes (Signed)
Name: Sheryl Perez   MRN: [id number]    DOB: February 17, 1960   Date:01/28/2015       Progress Note  Subjective  Chief Complaint  Chief Complaint  Patient presents with  . Urinary Tract Infection    HPI C/o urinary freq. and some pressure at bladder. No dysuria.  Some low back pain.  No fever.  No N/V.  She has been started back on on Meds for bipolar disorder (Seroquel) and seeing Psych.   No problem-specific assessment & plan notes found for this encounter.   Past Medical History  Diagnosis Date  . Depression   . Bipolar disorder ([place])   . Chronic UTI   . Other specified disease of hair and hair follicles   . Injury, other and unspecified, knee, leg, ankle, and foot   . Chest pain, unspecified   . Dysfunction of eustachian tube   . Unspecified symptom associated with female genital organs   . Rectal prolapse   . Enlargement of lymph nodes   . 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 Back injury   . Chronic low back pain 10/23/2013  . History of Bell's palsy Right    Social History  Substance Use Topics  . Smoking status: Current Every Day Smoker -- 1.00 packs/day for 40 years    Types: Cigarettes  . Smokeless tobacco: Never Used  . Alcohol Use: 0.6 oz/week    1 Glasses of wine, 0 Standard drinks or equivalent per week     Comment: occasional     Current outpatient prescriptions:  .  LORazepam (ATIVAN) 1 MG tablet, Take 1 tablet (1 mg total) by mouth at bedtime., Disp: 30 tablet, Rfl: 0 .  naproxen (NAPROSYN) 500 MG tablet, TAKE 1 TABLET BY MOUTH TWICE A DAY WITH A MEAL, Disp: 60 tablet, Rfl: 3 .  QUEtiapine (SEROQUEL) 25 MG tablet, Take 1 tablet (25 mg total) by mouth every morning., Disp: 30 tablet, Rfl: 0 .  QUEtiapine (SEROQUEL) 300 MG tablet, Take 1 tablet (300 mg total) by mouth at bedtime., Disp: 30 tablet, Rfl: 0 .  traMADol (ULTRAM) 50 MG tablet, TAKE 2 TABLETS BY MOUTH EVERY 6 HOURS AS NEEDED. **MUST LAST 28 DAYS**, Disp: 60 tablet, Rfl:  3  Allergies  Allergen Reactions  . Other Other (See Comments)    PT states that anything with the sides effects "may cause rash" she cannot take due to her psoriasis, it will make the patches on her hand so thick she can't use her hands.   . Sulfa Antibiotics Swelling    Diarrhea,dizziness, tongue swelling  . Abilify [Aripiprazole]   . Cephalosporins Rash  . Erythromycin Other (See Comments)    Abd. pain  . Risperdal [Risperidone]     tremors    Review of Systems  Constitutional: Negative for fever, chills, weight loss and malaise/fatigue.  HENT: Negative for hearing loss.   Eyes: Negative for blurred vision and double vision.  Respiratory: Negative for cough, shortness of breath and wheezing.   Cardiovascular: Negative for chest pain, palpitations and leg swelling.  Gastrointestinal: Negative for heartburn, abdominal pain and blood in stool.  Genitourinary: Positive for urgency and frequency. Negative for dysuria.  Musculoskeletal: Positive for joint pain. Negative for myalgias.  Skin: Negative for rash.  Neurological: Negative for weakness and headaches.      Objective  Filed Vitals:   01/28/15 0810  BP: 127/78  Pulse: 92  Temp: 97.9 F (36.6 C)  TempSrc: Oral  Resp: 16  Height: 5' (1.524 m)  Weight: 182 lb (82.555 kg)     Physical Exam  Constitutional: She is oriented to person, place, and time and well-developed, well-nourished, and in no distress. No distress.  HENT:  Head: Normocephalic and atraumatic.  Cardiovascular: Normal rate, regular rhythm and normal heart sounds.  Exam reveals no gallop and no friction rub.   No murmur heard. Pulmonary/Chest: Effort normal and breath sounds normal. No respiratory distress. She has no wheezes. She has no rales.  Abdominal: Soft. Bowel sounds are normal. She exhibits no distension and no mass. There is no tenderness.  No CVA tenderness  Neurological: She is alert and oriented to person, place, and time.  Vitals  reviewed.     Recent Results (from the past 2160 hour(s))  Comprehensive metabolic panel     Status: Abnormal   Collection Time: 01/14/15  8:56 AM  Result Value Ref Range   Sodium 138 135 - 145 mmol/L   Potassium 3.7 3.5 - 5.1 mmol/L   Chloride 106 101 - 111 mmol/L   CO2 23 22 - 32 mmol/L   Glucose, Bld 109 (H) 65 - 99 mg/dL   BUN 24 (H) 6 - 20 mg/dL   Creatinine, Ser 0.93 0.44 - 1.00 mg/dL   Calcium 9.5 8.9 - 10.3 mg/dL   Total Protein 8.0 6.5 - 8.1 g/dL   Albumin 4.3 3.5 - 5.0 g/dL   AST 44 (H) 15 - 41 U/L   ALT 26 14 - 54 U/L   Alkaline Phosphatase 99 38 - 126 U/L   Total Bilirubin 0.5 0.3 - 1.2 mg/dL   GFR calc non Af Amer >60 >60 mL/min   GFR calc Af Amer >60 >60 mL/min    Comment: (NOTE) The eGFR has been calculated using the CKD EPI equation. This calculation has not been validated in all clinical situations. eGFR's persistently <60 mL/min signify possible Chronic Kidney Disease.    Anion gap 9 5 - 15  Ethanol (ETOH)     Status: None   Collection Time: 01/14/15  8:56 AM  Result Value Ref Range   Alcohol, Ethyl (B) <5 <5 mg/dL    Comment:        LOWEST DETECTABLE LIMIT FOR SERUM ALCOHOL IS 5 mg/dL FOR MEDICAL PURPOSES ONLY   Salicylate level     Status: None   Collection Time: 01/14/15  8:56 AM  Result Value Ref Range   Salicylate Lvl <0.3 2.8 - 30.0 mg/dL  Acetaminophen level     Status: Abnormal   Collection Time: 01/14/15  8:56 AM  Result Value Ref Range   Acetaminophen (Tylenol), Serum <10 (L) 10 - 30 ug/mL    Comment:        THERAPEUTIC CONCENTRATIONS VARY SIGNIFICANTLY. A RANGE OF 10-30 ug/mL MAY BE AN EFFECTIVE CONCENTRATION FOR MANY PATIENTS. HOWEVER, SOME ARE BEST TREATED AT CONCENTRATIONS OUTSIDE THIS RANGE. ACETAMINOPHEN CONCENTRATIONS >150 ug/mL AT 4 HOURS AFTER INGESTION AND >50 ug/mL AT 12 HOURS AFTER INGESTION ARE OFTEN ASSOCIATED WITH TOXIC REACTIONS.   CBC     Status: Abnormal   Collection Time: 01/14/15  8:56 AM  Result Value  Ref Range   WBC 18.4 (H) 3.6 - 11.0 K/uL   RBC 4.26 3.80 - 5.20 MIL/uL   Hemoglobin 13.0 12.0 - 16.0 g/dL   HCT 39.3 35.0 - 47.0 %   MCV 92.3 80.0 - 100.0 fL   MCH 30.6 26.0 - 34.0 pg   MCHC 33.1 32.0 - 36.0  g/dL   RDW 13.2 11.5 - 14.5 %   Platelets 341 150 - 440 K/uL  Urine Drug Screen, Qualitative ([facility] only)     Status: Abnormal   Collection Time: 01/14/15  8:56 AM  Result Value Ref Range   Tricyclic, Ur Screen NONE DETECTED NONE DETECTED   Amphetamines, Ur Screen NONE DETECTED NONE DETECTED   MDMA (Ecstasy)Ur Screen NONE DETECTED NONE DETECTED   Cocaine Metabolite,Ur [place] NONE DETECTED NONE DETECTED   Opiate, Ur Screen NONE DETECTED NONE DETECTED   Phencyclidine (PCP) Ur S NONE DETECTED NONE DETECTED   Cannabinoid 50 Ng, Ur [place] POSITIVE (A) NONE DETECTED   Barbiturates, Ur Screen NONE DETECTED NONE DETECTED   Benzodiazepine, Ur Scrn NONE DETECTED NONE DETECTED   Methadone Scn, Ur NONE DETECTED NONE DETECTED    Comment: (NOTE) 588  Tricyclics, urine               Cutoff 1000 ng/mL 200  Amphetamines, urine             Cutoff 1000 ng/mL 300  MDMA (Ecstasy), urine           Cutoff 500 ng/mL 400  Cocaine Metabolite, urine       Cutoff 300 ng/mL 500  Opiate, urine                   Cutoff 300 ng/mL 600  Phencyclidine (PCP), urine      Cutoff 25 ng/mL 700  Cannabinoid, urine              Cutoff 50 ng/mL 800  Barbiturates, urine             Cutoff 200 ng/mL 900  Benzodiazepine, urine           Cutoff 200 ng/mL 1000 Methadone, urine                Cutoff 300 ng/mL 1100 1200 The urine drug screen provides only a preliminary, unconfirmed 1300 analytical test result and should not be used for non-medical 1400 purposes. Clinical consideration and professional judgment should 1500 be applied to any positive drug screen result due to possible 1600 interfering substances. A more specific alternate chemical method 1700 must be used in order to obtain a confirmed analytical result.  1800 Gas  chromato graphy / mass spectrometry (GC/MS) is the preferred 1900 confirmatory method.   Lithium level     Status: Abnormal   Collection Time: 01/14/15  8:56 AM  Result Value Ref Range   Lithium Lvl <0.06 (L) 0.60 - 1.20 mmol/L  Urinalysis complete, with microscopic ([facility] only)     Status: Abnormal   Collection Time: 01/14/15  8:56 AM  Result Value Ref Range   Color, Urine YELLOW (A) YELLOW   APPearance CLEAR (A) CLEAR   Glucose, UA NEGATIVE NEGATIVE mg/dL   Bilirubin Urine NEGATIVE NEGATIVE   Ketones, ur NEGATIVE NEGATIVE mg/dL   Specific Gravity, Urine 1.013 1.005 - 1.030   Hgb urine dipstick 2+ (A) NEGATIVE   pH 6.0 5.0 - 8.0   Protein, ur 100 (A) NEGATIVE mg/dL   Nitrite NEGATIVE NEGATIVE   Leukocytes, UA NEGATIVE NEGATIVE   RBC / HPF 0-5 0 - 5 RBC/hpf   WBC, UA 0-5 0 - 5 WBC/hpf   Bacteria, UA RARE (A) NONE SEEN   Squamous Epithelial / LPF 0-5 (A) NONE SEEN   Mucous PRESENT    Hyaline Casts, UA PRESENT   Hemoglobin A1c     Status: None  Collection Time: 01/15/15  4:26 PM  Result Value Ref Range   Hgb A1c MFr Bld 5.4 4.0 - 6.0 %  Lipid panel, fasting     Status: Abnormal   Collection Time: 01/15/15  4:26 PM  Result Value Ref Range   Cholesterol 165 0 - 200 mg/dL   Triglycerides 178 (H) <150 mg/dL   HDL 59 >40 mg/dL   Total CHOL/HDL Ratio 2.8 RATIO   VLDL 36 0 - 40 mg/dL   LDL Cholesterol 70 0 - 99 mg/dL    Comment:        Total Cholesterol/HDL:CHD Risk Coronary Heart Disease Risk Table                     Men   Women  1/2 Average Risk   3.4   3.3  Average Risk       5.0   4.4  2 X Average Risk   9.6   7.1  3 X Average Risk  23.4   11.0        Use the calculated Patient Ratio above and the CHD Risk Table to determine the patient's CHD Risk.        ATP III CLASSIFICATION (LDL):  <100     mg/dL   Optimal  100-129  mg/dL   Near or Above                    Optimal  130-159  mg/dL   Borderline  160-189  mg/dL   High  >190     mg/dL   Very High   TSH      Status: None   Collection Time: 01/15/15  4:26 PM  Result Value Ref Range   TSH 2.343 0.350 - 4.500 uIU/mL  POCT urinalysis dipstick     Status: Abnormal   Collection Time: 01/28/15  8:20 AM  Result Value Ref Range   Color, UA yellow    Clarity, UA cloudy    Glucose, UA neg    Bilirubin, UA neg    Ketones, UA neg    Spec Grav, UA 1.010    Blood, UA large    pH, UA 6.5    Protein, UA 30    Urobilinogen, UA negative    Nitrite, UA neg    Leukocytes, UA large (3+) (A) Negative     Assessment & Plan  1. Frequency of urination and polyuria  - POCT urinalysis dipstick- + blood and + leukocytes - Urine Culture - nitrofurantoin, macrocrystal-monohydrate, (MACROBID) 100 MG capsule; Take 1 capsule (100 mg total) by mouth 2 (two) times daily.  Dispense: 14 capsule; Refill: 0

## 2015-01-28 NOTE — Patient Instructions (Signed)
Drink plenty of fluids, including cranberry juice.

## 2015-01-30 LAB — URINE CULTURE

## 2015-04-16 ENCOUNTER — Ambulatory Visit (INDEPENDENT_AMBULATORY_CARE_PROVIDER_SITE_OTHER): Payer: 59 | Admitting: Family Medicine

## 2015-04-16 VITALS — BP 130/67 | HR 96 | Temp 98.0°F | Resp 16 | Ht 60.0 in | Wt 177.6 lb

## 2015-04-16 DIAGNOSIS — L02419 Cutaneous abscess of limb, unspecified: Secondary | ICD-10-CM

## 2015-04-16 HISTORY — PX: INCISION AND DRAINAGE ABSCESS: SHX5864

## 2015-04-16 MED ORDER — LIDOCAINE-EPINEPHRINE 2 %-1:100000 IJ SOLN: 1.7000 mL | Freq: Once | INTRAMUSCULAR | Status: DC

## 2015-04-16 MED ORDER — DOXYCYCLINE HYCLATE 100 MG PO TABS: 100.0000 mg | ORAL_TABLET | Freq: Two times a day (BID) | ORAL | Status: DC

## 2015-04-16 NOTE — Patient Instructions (Signed)

## 2015-04-16 NOTE — Progress Notes (Signed)
Subjective:    Patient ID: Sheryl Perez, female    DOB: 1960/07/03, 55 y.o.   MRN: [id number]  HPI: Sheryl Perez is a 55 y.o. female presenting on 04/16/2015 for Cyst   HPI  Pt presents for abscess in the L axilla. This is a chronic issue for her. 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 Multiple abscesses lanced and treated previously.  Pt noticed draining abscess 3 days ago. 2 days prior was red and swollen. Feels better now that it is draining. No fevers. No spreading redness.   Past Medical History  Diagnosis Date  . Depression   . Bipolar disorder ([place])   . Chronic UTI   . Other specified disease of hair and hair follicles   . Injury, other and unspecified, knee, leg, ankle, and foot   . Chest pain, unspecified   . Dysfunction of eustachian tube   . Unspecified symptom associated with female genital organs   . Rectal prolapse   . Enlargement of lymph nodes   . 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 Back injury   . Chronic low back pain 10/23/2013  . History of Bell's palsy Right    Current Outpatient Prescriptions on File Prior to Visit  Medication Sig  . LORazepam (ATIVAN) 1 MG tablet Take 1 tablet (1 mg total) by mouth at bedtime.  . naproxen (NAPROSYN) 500 MG tablet TAKE 1 TABLET BY MOUTH TWICE A DAY WITH A MEAL  . QUEtiapine (SEROQUEL) 300 MG tablet Take 1 tablet (300 mg total) by mouth at bedtime.  . traMADol (ULTRAM) 50 MG tablet TAKE 2 TABLETS BY MOUTH EVERY 6 HOURS AS NEEDED. **MUST LAST 28 DAYS**   No current facility-administered medications on file prior to visit.    Review of Systems  Constitutional: Negative for fever and chills.  HENT: Negative.   Respiratory: Negative for cough, chest tightness and wheezing.   Cardiovascular: Negative for chest pain and leg swelling.  Gastrointestinal: Negative for nausea, vomiting, abdominal pain, diarrhea and constipation.  Endocrine: Negative.  Negative for cold intolerance,  heat intolerance, polydipsia, polyphagia and polyuria.  Genitourinary: Negative for dysuria and difficulty urinating.  Musculoskeletal: Negative.   Skin: Positive for color change and wound. Negative for rash.  Neurological: Negative for dizziness, light-headedness and numbness.  Psychiatric/Behavioral: Negative.    Per HPI unless specifically indicated above     Objective:    BP 130/67 mmHg  Pulse 96  Temp(Src) 98 F (36.7 C) (Oral)  Resp 16  Ht 5' (1.524 m)  Wt 177 lb 9.6 oz (80.559 kg)  BMI 34.69 kg/m2  LMP 02/09/2008  Wt Readings from Last 3 Encounters:  04/16/15 177 lb 9.6 oz (80.559 kg)  01/28/15 182 lb (82.555 kg)  01/15/15 165 lb 0.4 oz (74.855 kg)    Physical Exam  Constitutional: She is oriented to person, place, and time. She appears well-developed and well-nourished.  HENT:  Head: Normocephalic and atraumatic.  Neck: Neck supple.  Cardiovascular: Normal rate, regular rhythm and normal heart sounds.  Exam reveals no gallop and no friction rub.   No murmur heard. Pulmonary/Chest: Effort normal and breath sounds normal. She has no wheezes. She exhibits no tenderness.  Abdominal: Soft. Normal appearance and bowel sounds are normal. She exhibits no distension and no mass. There is no tenderness. There is no rebound and no guarding.  Musculoskeletal: Normal range of motion. She exhibits no edema or tenderness.  Lymphadenopathy:  She has no cervical adenopathy.  Neurological: She is alert and oriented to person, place, and time.  Skin: Skin is warm and dry. Lesion noted.  Erythematous lesion in the L axilla draining serous/serosanginous fluid when compressed. Tender to touch. No spreading erythema.     I&D Location: L axilla  Size: 2 cm diameter  Verbal informed consent obtained.  Pt aware of risks not limited to but including infection, bleeding, damage to near by organs.  Prep: etoh/betadine  Anesthesia: 1%lidocaine with epi, good effect  Incision made  with #11 blade  Wound explored and loculations removed  Wound packed with iodoform gauze    Results for orders placed or performed in visit on 01/28/15  Urine Culture  Result Value Ref Range   Urine Culture, Routine Final report (A)    Urine Culture result 1 Escherichia coli (A)    ANTIMICROBIAL SUSCEPTIBILITY Comment   POCT urinalysis dipstick  Result Value Ref Range   Color, UA yellow    Clarity, UA cloudy    Glucose, UA neg    Bilirubin, UA neg    Ketones, UA neg    Spec Grav, UA 1.010    Blood, UA large    pH, UA 6.5    Protein, UA 30    Urobilinogen, UA negative    Nitrite, UA neg    Leukocytes, UA large (3+) (A) Negative      Assessment & Plan:   Problem List Items Addressed This Visit      Other   Abscess, axilla - Primary    I and D today. Tolerated well. Routine postprocedure instructions d/w pt- remove packing in 24-48h, keep area clean and bandaged, follow up if concerns/spreading erythema/pain. Pt has appt on Friday. Will do wound check then. Due to recurrence and MRSA, would like patient to have surgical consult for removal of multiple knots felt under the skin.        Relevant Medications   lidocaine-EPINEPHrine (XYLOCAINE W/EPI) 2 %-1:100000 (with pres) injection 1.7 mL   doxycycline (VIBRA-TABS) 100 MG tablet   Other Relevant Orders   Wound culture   Ambulatory referral to General Surgery      Meds ordered this encounter  Medications  . cyclobenzaprine (FLEXERIL) 5 MG tablet    Sig: TAKE 1 TABLET EVERY 6 HOURS AS NEEDED FOR SPASMS    Refill:  0  . lidocaine-EPINEPHrine (XYLOCAINE W/EPI) 2 %-1:100000 (with pres) injection 1.7 mL    Sig:   . doxycycline (VIBRA-TABS) 100 MG tablet    Sig: Take 1 tablet (100 mg total) by mouth 2 (two) times daily.    Dispense:  20 tablet    Refill:  0    Order Specific Question:  Supervising Provider    Answer:  Arlis Porta [id number]      Follow up plan: Return in about 2 days (around 04/18/2015)  for wound check. Marland Kitchen

## 2015-04-16 NOTE — Assessment & Plan Note (Signed)
I and D today. Tolerated well. Routine postprocedure instructions d/w pt- remove packing in 24-48h, keep area clean and bandaged, follow up if concerns/spreading erythema/pain. Pt has appt on Friday. Will do wound check then. Due to recurrence and MRSA, would like patient to have surgical consult for removal of multiple knots felt under the skin.

## 2015-04-17 ENCOUNTER — Encounter: Payer: Self-pay | Admitting: *Deleted

## 2015-04-18 ENCOUNTER — Other Ambulatory Visit: Payer: Self-pay | Admitting: Family Medicine

## 2015-04-18 ENCOUNTER — Encounter: Payer: Self-pay | Admitting: Family Medicine

## 2015-04-18 ENCOUNTER — Ambulatory Visit (INDEPENDENT_AMBULATORY_CARE_PROVIDER_SITE_OTHER): Payer: 59 | Admitting: Family Medicine

## 2015-04-18 VITALS — BP 122/76 | HR 97 | Temp 97.7°F | Resp 16 | Ht 60.0 in | Wt 179.0 lb

## 2015-04-18 DIAGNOSIS — Z01419 Encounter for gynecological examination (general) (routine) without abnormal findings: Secondary | ICD-10-CM

## 2015-04-18 DIAGNOSIS — F3110 Bipolar disorder, current episode manic without psychotic features, unspecified: Secondary | ICD-10-CM

## 2015-04-18 DIAGNOSIS — Z8249 Family history of ischemic heart disease and other diseases of the circulatory system: Secondary | ICD-10-CM

## 2015-04-18 DIAGNOSIS — F603 Borderline personality disorder: Secondary | ICD-10-CM

## 2015-04-18 LAB — WOUND CULTURE

## 2015-04-18 NOTE — Progress Notes (Signed)
Subjective:    Patient ID: Sheryl Perez, female    DOB: 1960-03-06, 55 y.o.   MRN: [id number]  HPI: Sheryl Perez is a 55 y.o. female presenting on 04/18/2015 for Annual Exam and Anxiety   HPI  Pt presents for well woman exam. Had hysterecotomy for prolaspse in 2010. She still has ovaries. She had a mammogram in June 2016. No lumps bumps or changes. No vaginal discharge. No dyspareunia. No vaginal bleeding. Colonoscopy- 2-3 years ago. Had 5 year follow-up- removed 10 polyps.  TDAP: last year.  Wound is doing well. Not draining. No redness.  She is having issues with anxiety. She has bipolar disorder and is having increased anxiety. She would like to change psychiatrists.    Past Medical History  Diagnosis Date  . Depression   . Bipolar disorder ([place])   . Chronic UTI   . Other specified disease of hair and hair follicles   . Injury, other and unspecified, knee, leg, ankle, and foot   . Chest pain, unspecified   . Dysfunction of eustachian tube   . Unspecified symptom associated with female genital organs   . Rectal prolapse   . Enlargement of lymph nodes   . 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 Back injury   . Chronic low back pain 10/23/2013  . History of Bell's palsy Right   Social History   Social History  . Marital Status: Married    Spouse Name: N/A  . Number of Children: 3  . Years of Education: hs   Occupational History  . [employer]   Social History Main Topics  . Smoking status: Current Every Day Smoker -- 1.00 packs/day for 40 years    Types: Cigarettes  . Smokeless tobacco: Never Used  . Alcohol Use: 0.6 oz/week    1 Glasses of wine, 0 Standard drinks or equivalent per week     Comment: occasional  . Drug Use: Yes    Special: Marijuana  . Sexual Activity: Not on file   Other Topics Concern  . Not on file   Social History Narrative   Patient lives at home with her husband [name]).   Patient works  full time.   Education CNA    Right handed.   Caffeine Tea , Soda.   Family History  Problem Relation Age of Onset  . Parkinson's disease Maternal Grandfather   . Diabetes Father   . Cancer Father     brain tumor  . Diabetes Paternal Grandfather   . Colon polyps Mother   . Heart disease Maternal Grandmother   . Heart attack Maternal Grandmother   . Stroke Maternal Grandmother   . Breast cancer Paternal Aunt   . Breast cancer Cousin    Current Outpatient Prescriptions on File Prior to Visit  Medication Sig  . cyclobenzaprine (FLEXERIL) 5 MG tablet TAKE 1 TABLET EVERY 6 HOURS AS NEEDED FOR SPASMS  . doxycycline (VIBRA-TABS) 100 MG tablet Take 1 tablet (100 mg total) by mouth 2 (two) times daily.  . naproxen (NAPROSYN) 500 MG tablet TAKE 1 TABLET BY MOUTH TWICE A DAY WITH A MEAL  . QUEtiapine (SEROQUEL) 300 MG tablet Take 1 tablet (300 mg total) by mouth at bedtime.  . traMADol (ULTRAM) 50 MG tablet TAKE 2 TABLETS BY MOUTH EVERY 6 HOURS AS NEEDED. **MUST LAST 28 DAYS**  . LORazepam (ATIVAN) 1 MG tablet Take 1 tablet (1 mg total) by  mouth at bedtime. (Patient not taking: Reported on 04/18/2015)   Current Facility-Administered Medications on File Prior to Visit  Medication  . lidocaine-EPINEPHrine (XYLOCAINE W/EPI) 2 %-1:100000 (with pres) injection 1.7 mL    Review of Systems  Constitutional: Negative for fever and chills.  HENT: Negative.   Respiratory: Negative for cough, chest tightness and wheezing.   Cardiovascular: Negative for chest pain and leg swelling.  Gastrointestinal: Negative for nausea, vomiting, abdominal pain, diarrhea and constipation.  Endocrine: Negative.  Negative for cold intolerance, heat intolerance, polydipsia, polyphagia and polyuria.  Genitourinary: Negative for dysuria and difficulty urinating.  Musculoskeletal: Negative.   Skin: Positive for wound.  Neurological: Negative for dizziness, light-headedness and numbness.  Psychiatric/Behavioral:  Negative.    Per HPI unless specifically indicated above     Objective:    BP 122/76 mmHg  Pulse 97  Temp(Src) 97.7 F (36.5 C) (Oral)  Resp 16  Ht 5' (1.524 m)  Wt 179 lb (81.194 kg)  BMI 34.96 kg/m2  LMP 02/09/2008  Wt Readings from Last 3 Encounters:  04/18/15 179 lb (81.194 kg)  04/16/15 177 lb 9.6 oz (80.559 kg)  01/28/15 182 lb (82.555 kg)    Physical Exam  Constitutional: She is oriented to person, place, and time. She appears well-developed and well-nourished.  HENT:  Head: Normocephalic and atraumatic.  Neck: Neck supple.  Cardiovascular: Normal rate, regular rhythm and normal heart sounds.  Exam reveals no gallop and no friction rub.   No murmur heard. Pulmonary/Chest: Effort normal and breath sounds normal. She has no wheezes. She exhibits no tenderness.  Abdominal: Soft. Normal appearance and bowel sounds are normal. She exhibits no distension and no mass. There is no tenderness. There is no rebound and no guarding.  Genitourinary: There is no rash, tenderness, lesion or injury on the right labia. There is no rash, tenderness, lesion or injury on the left labia. Right adnexum displays no mass, no tenderness and no fullness. Left adnexum displays no mass, no tenderness and no fullness. No erythema in the vagina. No signs of injury around the vagina.  Uterus/ cervix surgically absent  Musculoskeletal: Normal range of motion. She exhibits no edema or tenderness.  Lymphadenopathy:    She has no cervical adenopathy.  Neurological: She is alert and oriented to person, place, and time.  Skin: Skin is warm and dry. Lesion and rash noted. Rash is nodular.  Healing incision from I&D.  Multiple nodular cystic lesions felt in axilla, groin, and under pannus. Consisent with where past boils have been.     Results for orders placed or performed in visit on 04/16/15  Wound culture  Result Value Ref Range   Gram Stain Result Final report    Result 1 Comment    RESULT 2  Comment    Aerobic Bacterial Culture Preliminary report (A)    Result 1 Gram negative rods (A)       Assessment & Plan:   Problem List Items Addressed This Visit    None    Visit Diagnoses    Well woman exam with routine gynecological exam    -  Primary    Health recommendations made. Mammo due in June. TDAP UTD. Colonscopy UTD. Encouraged healthy lifestyle choices.     Relevant Orders    Comprehensive Metabolic Panel (CMET)    CBC with Differential    Family history of heart disease        Check lipid panels.     Relevant Orders  Lipid Profile       No orders of the defined types were placed in this encounter.      Follow up plan: Return in about 1 year (around 04/17/2016).

## 2015-04-18 NOTE — Patient Instructions (Addendum)
Health Maintenance, Female Adopting a healthy lifestyle and getting preventive care can go a long way to promote health and wellness. Talk with your health care provider about what schedule of regular examinations is right for you. This is a good chance for you to check in with your provider about disease prevention and staying healthy. In between checkups, there are plenty of things you can do on your own. Experts have done a lot of research about which lifestyle changes and preventive measures are most likely to keep you healthy. Ask your health care provider for more information. WEIGHT AND DIET  Eat a healthy diet  Be sure to include plenty of vegetables, fruits, low-fat dairy products, and lean protein.  Do not eat a lot of foods high in solid fats, added sugars, or salt.  Get regular exercise. This is one of the most important things you can do for your health.  Most adults should exercise for at least 150 minutes each week. The exercise should increase your heart rate and make you sweat (moderate-intensity exercise).  Most adults should also do strengthening exercises at least twice a week. This is in addition to the moderate-intensity exercise.  Maintain a healthy weight  Body mass index (BMI) is a measurement that can be used to identify possible weight problems. It estimates body fat based on height and weight. Your health care provider can help determine your BMI and help you achieve or maintain a healthy weight.  For females 20 years of age and older:   A BMI below 18.5 is considered underweight.  A BMI of 18.5 to 24.9 is normal.  A BMI of 25 to 29.9 is considered overweight.  A BMI of 30 and above is considered obese.  Watch levels of cholesterol and blood lipids  You should start having your blood tested for lipids and cholesterol at 55 years of age, then have this test every 5 years.  You may need to have your cholesterol levels checked more often if:  Your lipid  or cholesterol levels are high.  You are older than 55 years of age.  You are at high risk for heart disease.  CANCER SCREENING   Lung Cancer  Lung cancer screening is recommended for adults 55-80 years old who are at high risk for lung cancer because of a history of smoking.  A yearly low-dose CT scan of the lungs is recommended for people who:  Currently smoke.  Have quit within the past 15 years.  Have at least a 30-pack-year history of smoking. A pack year is smoking an average of one pack of cigarettes a day for 1 year.  Yearly screening should continue until it has been 15 years since you quit.  Yearly screening should stop if you develop a health problem that would prevent you from having lung cancer treatment.  Breast Cancer  Practice breast self-awareness. This means understanding how your breasts normally appear and feel.  It also means doing regular breast self-exams. Let your health care provider know about any changes, no matter how small.  If you are in your 20s or 30s, you should have a clinical breast exam (CBE) by a health care provider every 1-3 years as part of a regular health exam.  If you are 40 or older, have a CBE every year. Also consider having a breast X-ray (mammogram) every year.  If you have a family history of breast cancer, talk to your health care provider about genetic screening.  If you   are at high risk for breast cancer, talk to your health care provider about having an MRI and a mammogram every year.  Breast cancer gene (BRCA) assessment is recommended for women who have family members with BRCA-related cancers. BRCA-related cancers include:  Breast.  Ovarian.  Tubal.  Peritoneal cancers.  Results of the assessment will determine the need for genetic counseling and BRCA1 and BRCA2 testing. Cervical Cancer Your health care provider may recommend that you be screened regularly for cancer of the pelvic organs (ovaries, uterus, and  vagina). This screening involves a pelvic examination, including checking for microscopic changes to the surface of your cervix (Pap test). You may be encouraged to have this screening done every 3 years, beginning at age 21.  For women ages 30-65, health care providers may recommend pelvic exams and Pap testing every 3 years, or they may recommend the Pap and pelvic exam, combined with testing for human papilloma virus (HPV), every 5 years. Some types of HPV increase your risk of cervical cancer. Testing for HPV may also be done on women of any age with unclear Pap test results.  Other health care providers may not recommend any screening for nonpregnant women who are considered low risk for pelvic cancer and who do not have symptoms. Ask your health care provider if a screening pelvic exam is right for you.  If you have had past treatment for cervical cancer or a condition that could lead to cancer, you need Pap tests and screening for cancer for at least 20 years after your treatment. If Pap tests have been discontinued, your risk factors (such as having a new sexual partner) need to be reassessed to determine if screening should resume. Some women have medical problems that increase the chance of getting cervical cancer. In these cases, your health care provider may recommend more frequent screening and Pap tests. Colorectal Cancer  This type of cancer can be detected and often prevented.  Routine colorectal cancer screening usually begins at 55 years of age and continues through 55 years of age.  Your health care provider may recommend screening at an earlier age if you have risk factors for colon cancer.  Your health care provider may also recommend using home test kits to check for hidden blood in the stool.  A small camera at the end of a tube can be used to examine your colon directly (sigmoidoscopy or colonoscopy). This is done to check for the earliest forms of colorectal  cancer.  Routine screening usually begins at age 50.  Direct examination of the colon should be repeated every 5-10 years through 55 years of age. However, you may need to be screened more often if early forms of precancerous polyps or small growths are found. Skin Cancer  Check your skin from head to toe regularly.  Tell your health care provider about any new moles or changes in moles, especially if there is a change in a mole's shape or color.  Also tell your health care provider if you have a mole that is larger than the size of a pencil eraser.  Always use sunscreen. Apply sunscreen liberally and repeatedly throughout the day.  Protect yourself by wearing long sleeves, pants, a wide-brimmed hat, and sunglasses whenever you are outside. HEART DISEASE, DIABETES, AND HIGH BLOOD PRESSURE   High blood pressure causes heart disease and increases the risk of stroke. High blood pressure is more likely to develop in:  People who have blood pressure in the high end   of the normal range (130-139/85-89 mm Hg).  People who are overweight or obese.  People who are African American.  If you are 38-23 years of age, have your blood pressure checked every 3-5 years. If you are 61 years of age or older, have your blood pressure checked every year. You should have your blood pressure measured twice--once when you are at a hospital or clinic, and once when you are not at a hospital or clinic. Record the average of the two measurements. To check your blood pressure when you are not at a hospital or clinic, you can use:  An automated blood pressure machine at a pharmacy.  A home blood pressure monitor.  If you are between 45 years and 39 years old, ask your health care provider if you should take aspirin to prevent strokes.  Have regular diabetes screenings. This involves taking a blood sample to check your fasting blood sugar level.  If you are at a normal weight and have a low risk for diabetes,  have this test once every three years after 55 years of age.  If you are overweight and have a high risk for diabetes, consider being tested at a younger age or more often. PREVENTING INFECTION  Hepatitis B  If you have a higher risk for hepatitis B, you should be screened for this virus. You are considered at high risk for hepatitis B if:  You were born in a country where hepatitis B is common. Ask your health care provider which countries are considered high risk.  Your parents were born in a high-risk country, and you have not been immunized against hepatitis B (hepatitis B vaccine).  You have HIV or AIDS.  You use needles to inject street drugs.  You live with someone who has hepatitis B.  You have had sex with someone who has hepatitis B.  You get hemodialysis treatment.  You take certain medicines for conditions, including cancer, organ transplantation, and autoimmune conditions. Hepatitis C  Blood testing is recommended for:  Everyone born from [birth year] through 1965.  Anyone with known risk factors for hepatitis C. Sexually transmitted infections (STIs)  You should be screened for sexually transmitted infections (STIs) including gonorrhea and chlamydia if:  You are sexually active and are younger than 55 years of age.  You are older than 55 years of age and your health care provider tells you that you are at risk for this type of infection.  Your sexual activity has changed since you were last screened and you are at an increased risk for chlamydia or gonorrhea. Ask your health care provider if you are at risk.  If you do not have HIV, but are at risk, it may be recommended that you take a prescription medicine daily to prevent HIV infection. This is called pre-exposure prophylaxis (PrEP). You are considered at risk if:  You are sexually active and do not regularly use condoms or know the HIV status of your partner(s).  You take drugs by injection.  You are sexually  active with a partner who has HIV. Talk with your health care provider about whether you are at high risk of being infected with HIV. If you choose to begin PrEP, you should first be tested for HIV. You should then be tested every 3 months for as long as you are taking PrEP.  PREGNANCY   If you are premenopausal and you may become pregnant, ask your health care provider about preconception counseling.  If you may  become pregnant, take 400 to 800 micrograms (mcg) of folic acid every day.  If you want to prevent pregnancy, talk to your health care provider about birth control (contraception). OSTEOPOROSIS AND MENOPAUSE   Osteoporosis is a disease in which the bones lose minerals and strength with aging. This can result in serious bone fractures. Your risk for osteoporosis can be identified using a bone density scan.  If you are 61 years of age or older, or if you are at risk for osteoporosis and fractures, ask your health care provider if you should be screened.  Ask your health care provider whether you should take a calcium or vitamin D supplement to lower your risk for osteoporosis.  Menopause may have certain physical symptoms and risks.  Hormone replacement therapy may reduce some of these symptoms and risks. Talk to your health care provider about whether hormone replacement therapy is right for you.  HOME CARE INSTRUCTIONS   Schedule regular health, dental, and eye exams.  Stay current with your immunizations.   Do not use any tobacco products including cigarettes, chewing tobacco, or electronic cigarettes.  If you are pregnant, do not drink alcohol.  If you are breastfeeding, limit how much and how often you drink alcohol.  Limit alcohol intake to no more than 1 drink per day for nonpregnant women. One drink equals 12 ounces of beer, 5 ounces of wine, or 1 ounces of hard liquor.  Do not use street drugs.  Do not share needles.  Ask your health care provider for help if  you need support or information about quitting drugs.  Tell your health care provider if you often feel depressed.  Tell your health care provider if you have ever been abused or do not feel safe at home.   This information is not intended to replace advice given to you by your health care provider. Make sure you discuss any questions you have with your health care provider.   Document Released: 08/10/2010 Document Revised: 02/15/2014 Document Reviewed: 12/27/2012 Elsevier Interactive Patient Education [employer].

## 2015-04-22 ENCOUNTER — Telehealth: Payer: Self-pay | Admitting: Family Medicine

## 2015-04-22 DIAGNOSIS — L02419 Cutaneous abscess of limb, unspecified: Secondary | ICD-10-CM

## 2015-04-22 MED ORDER — AMOXICILLIN-POT CLAVULANATE 875-125 MG PO TABS: 1.0000 | ORAL_TABLET | Freq: Two times a day (BID) | ORAL | Status: DC

## 2015-04-22 NOTE — Telephone Encounter (Signed)
-----   Message from Devona Konig, [place] sent at 04/22/2015  9:48 AM EDT ----- See previous message.

## 2015-04-22 NOTE — Telephone Encounter (Signed)
Called pt regarding wound culture results. Pt was concerned that bacteria found in her wound is found in urine. She is now concerned her back pain is related to a possible UTI. She is reporting some urinary frequency- no dysuria Have encouraged pt to come in for visit to get urine checked for possible UTI.  She requested to drop off a urine sample only no visit. I told her this would be up to Dr. Luan Pulling as I will be out of the office and unable to review labs while gone. I am routing this to [facility] to see if he will allow a lab visit only.   Reviewed abx and pt has taken augmentin in the past. We will try it to clear up her current wound infection. Sent to pharmacy on file.

## 2015-04-23 ENCOUNTER — Encounter: Payer: Self-pay | Admitting: General Surgery

## 2015-04-23 ENCOUNTER — Ambulatory Visit (INDEPENDENT_AMBULATORY_CARE_PROVIDER_SITE_OTHER): Payer: 59 | Admitting: General Surgery

## 2015-04-23 DIAGNOSIS — L732 Hidradenitis suppurativa: Secondary | ICD-10-CM

## 2015-04-23 NOTE — Progress Notes (Signed)
Patient ID: Sheryl Perez, female   DOB: July 27, 1960, 55 y.o.   MRN: [id number]  Chief Complaint  Patient presents with  . Abscess    left axilla    HPI Sheryl Perez is a 55 y.o. female.  Here today for evaluation of left axillary abscess. She states this is a chronic issue for her. She has been treated for MRSA in the axilla multiple times 2016. Multiple abscesses lanced and treated previously. She also had an area of concern one month ago under her lower abdomen that drained as well.  I/D was done 04-16-15 for the left axillary area and she states it is much better.  No fevers.  I have reviewed the history of present illness with the patient.    HPI  Past Medical History  Diagnosis Date  . Depression   . Bipolar disorder ([place])   . Chronic UTI   . Other specified disease of hair and hair follicles   . Injury, other and unspecified, knee, leg, ankle, and foot   . Chest pain, unspecified   . Dysfunction of eustachian tube   . Unspecified symptom associated with female genital organs   . Rectal prolapse   . Enlargement of lymph nodes   . 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 Back injury   . Chronic low back pain 10/23/2013  . History of Bell's palsy Right  . MRSA (methicillin resistant staph aureus) culture positive 07-11-14    abscess    Past Surgical History  Procedure Laterality Date  . Cholecystectomy  2002  . Foot surgery Bilateral 2004    bunion  . Ganglion cyst excision  2002  . Partial hysterectomy  2010    uterus removed  . Bladder tack  2010  . Incision and drainage abscess  04-16-15    Family History  Problem Relation Age of Onset  . Parkinson's disease Maternal Grandfather   . Diabetes Father   . Cancer Father     brain tumor  . Diabetes Paternal Grandfather   . Colon polyps Mother   . Heart disease Maternal Grandmother   . Heart attack Maternal Grandmother   . Stroke Maternal Grandmother   . Breast cancer Paternal Aunt   . Breast  cancer Cousin     Social History Social History  Substance Use Topics  . Smoking status: Current Every Day Smoker -- 1.00 packs/day for 40 years    Types: Cigarettes  . Smokeless tobacco: Never Used  . Alcohol Use: 0.6 oz/week    1 Glasses of wine, 0 Standard drinks or equivalent per week     Comment: occasional    Allergies  Allergen Reactions  . Other Other (See Comments)    PT states that anything with the sides effects "may cause rash" she cannot take due to her psoriasis, it will make the patches on her hand so thick she can't use her hands.   . Sulfa Antibiotics Swelling    Diarrhea,dizziness, tongue swelling  . Abilify [Aripiprazole]   . Cephalosporins Rash  . Erythromycin Other (See Comments)    Abd. pain  . Risperdal [Risperidone]     tremors    Current Outpatient Prescriptions  Medication Sig Dispense Refill  . amoxicillin-clavulanate (AUGMENTIN) 875-125 MG tablet Take 1 tablet by mouth 2 (two) times daily. 14 tablet 0  . cyclobenzaprine (FLEXERIL) 5 MG tablet TAKE 1 TABLET EVERY 6 HOURS AS NEEDED FOR SPASMS  0  . doxycycline (VIBRA-TABS) 100 MG  tablet Take 1 tablet (100 mg total) by mouth 2 (two) times daily. 20 tablet 0  . LORazepam (ATIVAN) 1 MG tablet Take 1 tablet (1 mg total) by mouth at bedtime. 30 tablet 0  . naproxen (NAPROSYN) 500 MG tablet TAKE 1 TABLET BY MOUTH TWICE A DAY WITH A MEAL 60 tablet 3  . QUEtiapine (SEROQUEL) 300 MG tablet Take 1 tablet (300 mg total) by mouth at bedtime. 30 tablet 0  . traMADol (ULTRAM) 50 MG tablet TAKE 2 TABLETS BY MOUTH EVERY 6 HOURS AS NEEDED. **MUST LAST 28 DAYS** 60 tablet 3   Current Facility-Administered Medications  Medication Dose Route Frequency Provider Last Rate Last Dose  . lidocaine-EPINEPHrine (XYLOCAINE W/EPI) 2 %-1:100000 (with pres) injection 1.7 mL  1.7 mL Intradermal Once Amy Overton Mam, NP        Review of Systems Review of Systems  Constitutional: Negative.   Respiratory: Negative.    Cardiovascular: Negative.     Blood pressure 130/78, pulse 66, resp. rate 14, height 5' (1.524 m), weight 180 lb (81.647 kg), last menstrual period 02/09/2008.  Physical Exam Physical Exam  Constitutional: She is oriented to person, place, and time. She appears well-developed and well-nourished.  Eyes: Conjunctivae are normal. No scleral icterus.  Neck: Neck supple.  Cardiovascular: Normal rate, regular rhythm and normal heart sounds.   Pulmonary/Chest: Effort normal and breath sounds normal.  Abdominal: Soft. Bowel sounds are normal.  Lymphadenopathy:    She has no cervical adenopathy.    She has no axillary adenopathy.  Neurological: She is alert and oriented to person, place, and time.  Skin: Skin is warm and dry.  Multiple areas of cystic formation in axillary and pubic area. Indicative of hidradenitis most of them are healed areas. Currently there is 1 active area in left axilla and a couple in the lower abdominal fold.   Psychiatric: Her behavior is normal.       Assessment Hidradenitis. Most recent culture in left axilla grew Proteus.  Plan   Return in 1 month. Keep areas clean using wet wipes. Avoid powders or ointments. Clip care instead of shaving. Take and finish current antibiotic (augmentin) prescription.  PCP:  [place] [place] Ref Amy Krebs  Zaven Klemens G 04/23/2015, 1:18 PM

## 2015-04-23 NOTE — Patient Instructions (Addendum)
The patient is aware to call back for any questions or concerns. Return in 1 month. Keep areas clean using wet wipes. Avoid powders or ointments. Clip care instead of shaving. Take and finish current antibiotic prescription.

## 2015-04-23 NOTE — Telephone Encounter (Signed)
If she was started on Augmentin yesterday, the urine culture would not show anything anyway, probably.  The Augmentin hits many UTIs anyway.  I would wait until she finishes her Augmentin unless her symptoms got worse.  But if she wants to come in to see me for poss. UTI, we can work her in Fri AM.-jh

## 2015-04-23 NOTE — Telephone Encounter (Signed)
Pt advised as per Dr. Luan Pulling she hasn't started Augmentin yet but will pick Rx today and start and if her Sx gets worst then she will call to schedule an appointment.

## 2015-04-23 NOTE — Telephone Encounter (Signed)
Ok-jh 

## 2015-05-21 ENCOUNTER — Ambulatory Visit (INDEPENDENT_AMBULATORY_CARE_PROVIDER_SITE_OTHER): Payer: 59 | Admitting: Psychiatry

## 2015-05-21 ENCOUNTER — Encounter: Payer: Self-pay | Admitting: Psychiatry

## 2015-05-21 VITALS — BP 138/84 | HR 113 | Temp 97.8°F | Ht 60.0 in | Wt 174.0 lb

## 2015-05-21 DIAGNOSIS — F3110 Bipolar disorder, current episode manic without psychotic features, unspecified: Secondary | ICD-10-CM

## 2015-05-21 MED ORDER — LAMOTRIGINE 25 MG PO TABS: 25.0000 mg | ORAL_TABLET | Freq: Every day | ORAL | Status: DC

## 2015-05-21 MED ORDER — QUETIAPINE FUMARATE 300 MG PO TABS: 300.0000 mg | ORAL_TABLET | Freq: Every day | ORAL | Status: DC

## 2015-05-21 NOTE — Progress Notes (Signed)
Psychiatric Initial Adult Assessment   Patient Identification: Sheryl Perez MRN:  [id number] Date of Evaluation:  05/21/2015 Referral Source: Transfer from [employer] Complaint:   Chief Complaint    Establish Care; Anxiety; Depression; Drug Problem     Visit Diagnosis:    ICD-9-CM ICD-10-CM   1. Bipolar I disorder, most recent episode (or current) manic ([place]) 296.40 F31.10     History of Present Illness:    Patient is a 55 year old female who was transferred from [employer]. She reported that she was sent to [facility] after her discharge from the inpatient behavioral health unit in December. She did not like the place as she reported that it was not personalized and she was not getting her medications in the right manner. She saw the psychiatrist and a therapist but is going to cancel her next appointment. Patient reported that she has history of bipolar disorder and currently feeling stable on her medications. She reported that she has been taking Seroquel 300 mg at bedtime. She appeared to have somewhat rapid speech but reported that this is normal for her as she does not have any racing thoughts, impulsivity or anger. She currently denied having any suicidal homicidal ideations or plans. She reported that she is able to work for at least 8 hours on a daily basis. She reported that she has good relationship with her husband.  Associated Signs/Symptoms: Depression Symptoms:  anxiety, (Hypo) Manic Symptoms:  Flight of Ideas, Anxiety Symptoms:  none Psychotic Symptoms:  none PTSD Symptoms: Negative NA  Past Psychiatric History:  Patient reported that she has started following with a psychiatrist in [place] who diagnosed her with bipolar disorder in 2007. She was seen in that practice for approximately 2 years but says she owe them money she left the practice. She reported that she started following her primary care physician at [facility] dates and neck and her  medical doctor was prescribing her medications. He left them in 2009 and was admitted to [facility] as her lithium level was high. She reported that she was admitted to [facility] twice. One time it was due to manic episode and the other time due to lithium toxicity She reported that she started following at [facility] psychiatric practice. Her husband was incarcerated in 2014 and she stopped following her psychiatrist as she wanted to be manic to control her personal issues. She ran out of her medications and was not taking any medication for almost 1 year. She reported that he came out of the prison in 2015. Patient reported that she does not want to discuss about the reason he was incarcerated. However she was hospitalized 2 [facility] inpatient behavioral health unit in December 2016 due to manic behavior She was started on Seroquel and lorazepam and she responded well to the medications. She  was discharged to follow up at [facility] health but she does not like the practice  Patient denied any history of suicide attempts  Previous Psychotropic Medications:  Risperdal -akathsia Abilify- h/o catatonia Lamictal Lithium - took for 6 years, but her level went higher, no side effects. [facility]  Prozac Trazodone-     Substance Abuse History in the last 12 months:  Yes.    Alcohol - occasionally MJ- Daily- Smokes 2-3 puffs  Smokes 1/2 pack per day  Consequences of Substance Abuse: Negative NA  Past Medical History:  Past Medical History  Diagnosis Date  . Depression   . Bipolar disorder ([place])   . Chronic UTI   .  Other specified disease of hair and hair follicles   . Injury, other and unspecified, knee, leg, ankle, and foot   . Chest pain, unspecified   . Dysfunction of eustachian tube   . Unspecified symptom associated with female genital organs   . Rectal prolapse   . Enlargement of lymph nodes   . 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 Back injury   . Chronic low back  pain 10/23/2013  . History of Bell's palsy Right  . MRSA (methicillin resistant staph aureus) culture positive 07-11-14    abscess    Past Surgical History  Procedure Laterality Date  . Cholecystectomy  2002  . Foot surgery Bilateral 2004    bunion  . Ganglion cyst excision  2002  . Partial hysterectomy  2010    uterus removed  . Bladder tack  2010  . Incision and drainage abscess  04-16-15    Family Psychiatric History:   Daughter- PTSD, she takes medications for the same.   Family History:  Family History  Problem Relation Age of Onset  . Parkinson's disease Maternal Grandfather   . Diabetes Father   . Cancer Father     brain tumor  . Depression Father   . Diabetes Paternal Grandfather   . Colon polyps Mother   . Heart disease Maternal Grandmother   . Heart attack Maternal Grandmother   . Stroke Maternal Grandmother   . Breast cancer Paternal Aunt   . Breast cancer Cousin   . Depression Sister   . Alcohol abuse Brother   . Depression Brother     Social History:   Social History   Social History  . Marital Status: Married    Spouse Name: N/A  . Number of Children: 3  . Years of Education: hs   Occupational History  . [place]   Social History Main Topics  . Smoking status: Current Every Day Smoker -- 1.00 packs/day for 40 years    Types: Cigarettes  . Smokeless tobacco: Never Used  . Alcohol Use: No     Comment: occasional  . Drug Use: Yes    Special: Marijuana     Comment: last used 05-20-15  . Sexual Activity: Yes    Birth Control/ Protection: None   Other Topics Concern  . None   Social History Narrative   Patient lives at home with her husband [name]).   Patient works full time.   Education CNA    Right handed.   Caffeine Tea , Soda.    Additional Social History:   Married x 24 years. Lives with husband. Has 3 children and 4 grandchildren. 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   Allergies:   Allergies   Allergen Reactions  . Other Other (See Comments)    PT states that anything with the sides effects "may cause rash" she cannot take due to her psoriasis, it will make the patches on her hand so thick she can't use her hands.   . Sulfa Antibiotics Swelling    Diarrhea,dizziness, tongue swelling  . Abilify [Aripiprazole]   . Cephalosporins Rash  . Erythromycin Other (See Comments)    Abd. pain  . Risperdal [Risperidone]     tremors    Metabolic Disorder Labs: Lab Results  Component Value Date   HGBA1C 5.4 01/15/2015   No results found for: PROLACTIN Lab Results  Component Value Date   CHOL 165 01/15/2015  TRIG 178* 01/15/2015   HDL 59 01/15/2015   CHOLHDL 2.8 01/15/2015   VLDL 36 01/15/2015   LDLCALC 70 01/15/2015     Current Medications: Current Outpatient Prescriptions  Medication Sig Dispense Refill  . cyclobenzaprine (FLEXERIL) 5 MG tablet TAKE 1 TABLET EVERY 6 HOURS AS NEEDED FOR SPASMS  0  . LORazepam (ATIVAN) 1 MG tablet Take 1 tablet (1 mg total) by mouth at bedtime. 30 tablet 0  . naproxen (NAPROSYN) 500 MG tablet TAKE 1 TABLET BY MOUTH TWICE A DAY WITH A MEAL 60 tablet 3  . QUEtiapine (SEROQUEL) 300 MG tablet Take 1 tablet (300 mg total) by mouth at bedtime. 30 tablet 0  . traMADol (ULTRAM) 50 MG tablet TAKE 2 TABLETS BY MOUTH EVERY 6 HOURS AS NEEDED. **MUST LAST 28 DAYS** 60 tablet 3   Current Facility-Administered Medications  Medication Dose Route Frequency Provider Last Rate Last Dose  . lidocaine-EPINEPHrine (XYLOCAINE W/EPI) 2 %-1:100000 (with pres) injection 1.7 mL  1.7 mL Intradermal Once Amy Lauren Krebs, NP        Neurologic: Headache: No Seizure: No Paresthesias:No  Musculoskeletal: Strength & Muscle Tone: within normal limits Gait & Station: normal Patient leans: N/A  Psychiatric Specialty Exam: ROS  Blood pressure 138/84, pulse 113, temperature 97.8 F (36.6 C), temperature source Tympanic, height 5' (1.524 m), weight 174 lb (78.926  kg), last menstrual period 02/09/2008, SpO2 95 %.Body mass index is 33.98 kg/(m^2).  General Appearance: Casual and Fairly Groomed  Eye Contact:  Fair  Speech:  Pressured  Volume:  Normal  Mood:  Anxious  Affect:  Congruent  Thought Process:  Circumstantial  Orientation:  Full (Time, Place, and Person)  Thought Content:  WDL  Suicidal Thoughts:  No  Homicidal Thoughts:  No  Memory:  Immediate;   Fair  Judgement:  Fair  Insight:  Fair  Psychomotor Activity:  Normal  Concentration:  Fair  Recall:  [employer] of Knowledge:Fair  Language: Fair  Akathisia:  No  Handed:  Right  AIMS (if indicated):    Assets:  Communication Skills Desire for Improvement Physical Health Social Support  ADL's:  Intact  Cognition: WNL  Sleep:      Treatment Plan Summary: Medication management   Discussed with patient about the medications treatment risks benefits and alternatives She has not taken lorazepam in the past 2 months. I will not start her back on the same. She will continue on Seroquel 300 mg at bedtime She will will be started on lamotrigine 25 mg in the morning for her manic episode and she agreed with the plan.  Discussed with her about the risk of rash including Katherina Right syndrome  She will follow-up in 3 weeks or earlier depending on her symptoms    More than 50% of the time spent in psychoeducation, counseling and coordination of care.    This note was generated in part or whole with voice recognition software. Voice regonition is usually quite accurate but there are transcription errors that can and very often do occur. I apologize for any typographical errors that were not detected and corrected.    Rainey Pines, MD 4/12/20179:16 AM

## 2015-05-28 ENCOUNTER — Telehealth: Payer: Self-pay | Admitting: Psychiatry

## 2015-05-29 NOTE — Telephone Encounter (Signed)
Who discontinued her Lamictal?  She is already on Seroquel. Please continue Same  F/U next week.

## 2015-05-30 NOTE — Telephone Encounter (Signed)
spoke with patient, she states that she stop taking the lamital because it causes her to break out.

## 2015-05-31 LAB — CBC WITH DIFFERENTIAL/PLATELET
BASOS: 0 %
Basophils Absolute: 0 10*3/uL (ref 0.0–0.2)
EOS (ABSOLUTE): 0.4 10*3/uL (ref 0.0–0.4)
Eos: 4 %
HEMOGLOBIN: 12.2 g/dL (ref 11.1–15.9)
Hematocrit: 36.9 % (ref 34.0–46.6)
IMMATURE GRANS (ABS): 0 10*3/uL (ref 0.0–0.1)
Immature Granulocytes: 0 %
LYMPHS ABS: 2.4 10*3/uL (ref 0.7–3.1)
LYMPHS: 25 %
MCH: 31.4 pg (ref 26.6–33.0)
MCHC: 33.1 g/dL (ref 31.5–35.7)
MCV: 95 fL (ref 79–97)
MONOCYTES: 6 %
Monocytes Absolute: 0.6 10*3/uL (ref 0.1–0.9)
NEUTROS ABS: 6 10*3/uL (ref 1.4–7.0)
Neutrophils: 65 %
Platelets: 336 10*3/uL (ref 150–379)
RBC: 3.88 x10E6/uL (ref 3.77–5.28)
RDW: 13.5 % (ref 12.3–15.4)
WBC: 9.4 10*3/uL (ref 3.4–10.8)

## 2015-05-31 LAB — COMPREHENSIVE METABOLIC PANEL
ALK PHOS: 92 IU/L (ref 39–117)
ALT: 17 IU/L (ref 0–32)
AST: 20 IU/L (ref 0–40)
Albumin/Globulin Ratio: 1.5 (ref 1.2–2.2)
Albumin: 3.8 g/dL (ref 3.5–5.5)
BUN/Creatinine Ratio: 13 (ref 9–23)
BUN: 13 mg/dL (ref 6–24)
Bilirubin Total: <0.2 mg/dL (ref 0.0–1.2)
CALCIUM: 9.1 mg/dL (ref 8.7–10.2)
CO2: 22 mmol/L (ref 18–29)
CREATININE: 1.02 mg/dL — AB (ref 0.57–1.00)
Chloride: 103 mmol/L (ref 96–106)
GFR calc Af Amer: 72 mL/min/{1.73_m2} (ref >59)
GFR, EST NON AFRICAN AMERICAN: 63 mL/min/{1.73_m2} (ref >59)
GLOBULIN, TOTAL: 2.6 g/dL (ref 1.5–4.5)
GLUCOSE: 150 mg/dL — AB (ref 65–99)
Potassium: 4.4 mmol/L (ref 3.5–5.2)
SODIUM: 143 mmol/L (ref 134–144)
Total Protein: 6.4 g/dL (ref 6.0–8.5)

## 2015-05-31 LAB — LIPID PANEL
CHOL/HDL RATIO: 3.8 ratio (ref 0.0–4.4)
Cholesterol, Total: 169 mg/dL (ref 100–199)
HDL: 44 mg/dL (ref >39)
LDL CALC: 98 mg/dL (ref 0–99)
Triglycerides: 135 mg/dL (ref 0–149)
VLDL CHOLESTEROL CAL: 27 mg/dL (ref 5–40)

## 2015-06-03 ENCOUNTER — Ambulatory Visit (INDEPENDENT_AMBULATORY_CARE_PROVIDER_SITE_OTHER): Payer: 59 | Admitting: General Surgery

## 2015-06-03 ENCOUNTER — Encounter: Payer: Self-pay | Admitting: General Surgery

## 2015-06-03 VITALS — BP 136/78 | HR 84 | Resp 16 | Ht 60.0 in | Wt 176.0 lb

## 2015-06-03 DIAGNOSIS — L732 Hidradenitis suppurativa: Secondary | ICD-10-CM

## 2015-06-03 NOTE — Progress Notes (Signed)
Patient is following up from hidradenitis left axilla. She states the area has improved, but thinks she has some currently in the abdominal fold. She states that the axillary area has healed and no further drainage noted. I have reviewed the history of present illness with the patient.  Patient has multiple areas of cystic formation in bilateral axilla and pubic area which have since healed. No active areas.  No lymphadenopathy in axillae  Keep areas clean using wet wipes. Avoid powders or ointments and heavy deoderants. Call for worsening or new symptoms.    PCP:  [place] Ref Amy Krebs This information has been scribed by Karie Fetch RN, BSN,BC.

## 2015-06-03 NOTE — Patient Instructions (Addendum)
The patient is aware to call back for any questions or concerns. Keep areas clean using wet wipes. Avoid powders or ointments. Call for worsening or new sympto

## 2015-06-05 LAB — SPECIMEN STATUS REPORT

## 2015-06-05 LAB — HGB A1C W/O EAG: HEMOGLOBIN A1C: 5.8 % — AB (ref 4.8–5.6)

## 2015-06-11 ENCOUNTER — Ambulatory Visit (INDEPENDENT_AMBULATORY_CARE_PROVIDER_SITE_OTHER): Payer: 59 | Admitting: Psychiatry

## 2015-06-11 ENCOUNTER — Telehealth: Payer: Self-pay | Admitting: Psychiatry

## 2015-06-11 ENCOUNTER — Encounter: Payer: Self-pay | Admitting: Psychiatry

## 2015-06-11 VITALS — BP 162/92 | HR 119 | Temp 98.0°F | Ht 60.0 in | Wt 172.0 lb

## 2015-06-11 DIAGNOSIS — F3110 Bipolar disorder, current episode manic without psychotic features, unspecified: Secondary | ICD-10-CM | POA: Diagnosis not present

## 2015-06-11 MED ORDER — LITHIUM CARBONATE ER 300 MG PO TBCR: 300.0000 mg | EXTENDED_RELEASE_TABLET | Freq: Every day | ORAL | Status: DC

## 2015-06-11 MED ORDER — QUETIAPINE FUMARATE 50 MG PO TABS: 50.0000 mg | ORAL_TABLET | Freq: Two times a day (BID) | ORAL | Status: DC

## 2015-06-11 NOTE — Progress Notes (Signed)
Psychiatric MD Progress Note   Patient Identification: Sheryl Perez MRN:  [id number] Date of Evaluation:  06/11/2015 Referral Source: Transfer from [employer] Complaint:   Chief Complaint    Follow-up; Medication Refill; OTHER     Visit Diagnosis:    ICD-9-CM ICD-10-CM   1. Bipolar I disorder, most recent episode (or current) manic ([place]) 296.40 F31.10     History of Present Illness:    Patient is a 55 year old female With history of bipolar disorder who presented for follow-up. She appeared hypomanic during the interview and was talking fast with pressured speech. She reported that she stopped taking the lamotrigine as she was having worsening of her psoriatic  symptoms. She reported that she started having rash on her feet. She stated that she continues to have racing thoughts with pressured speech. She is unable to sleep well. Patient reported that she recently lost her job and she did not discuss that with her husband. She is unable to control her symptoms. She stated that the Seroquel does not help her and she wants to have her medications adjusted. It was difficult to redirect the patient during the interview. However she is interested in having her medications adjusted and reported that she took lithium for a long time in the past and she did well on it.  She has good relationship with her husband. We discussed about starting her on the lithium again and she agreed with the plan. Associated Signs/Symptoms: Depression Symptoms:  anxiety, (Hypo) Manic Symptoms:  Distractibility, Elevated Mood, Flight of Ideas, Grandiosity, Irritable Mood, Labiality of Mood, Anxiety Symptoms:  none Psychotic Symptoms:  none PTSD Symptoms: Negative NA  Past Psychiatric History:  Patient reported that she has started following with a psychiatrist in [place] who diagnosed her with bipolar disorder in 2007. She was seen in that practice for approximately 2 years but says she owe them  money she left the practice. She reported that she started following her primary care physician at [facility] dates and neck and her medical doctor was prescribing her medications. He left them in 2009 and was admitted to [facility] as her lithium level was high. She reported that she was admitted to [facility] twice. One time it was due to manic episode and the other time due to lithium toxicity She reported that she started following at [facility] psychiatric practice. Her husband was incarcerated in 2014 and she stopped following her psychiatrist as she wanted to be manic to control her personal issues. She ran out of her medications and was not taking any medication for almost 1 year. She reported that he came out of the prison in 2015. Patient reported that she does not want to discuss about the reason he was incarcerated. However she was hospitalized 2 [facility] inpatient behavioral health unit in December 2016 due to manic behavior She was started on Seroquel and lorazepam and she responded well to the medications. She  was discharged to follow up at [facility] health but she does not like the practice  Patient denied any history of suicide attempts  Previous Psychotropic Medications:  Risperdal -akathsia Abilify- h/o catatonia Lamictal Lithium - took for 6 years, but her level went higher, no side effects. [facility]  Prozac Trazodone-     Substance Abuse History in the last 12 months:  Yes.    Alcohol - occasionally MJ- Daily- Smokes 2-3 puffs  Smokes 1/2 pack per day  Consequences of Substance Abuse: Negative NA  Past Medical History:  Past  Medical History  Diagnosis Date  . Depression   . Bipolar disorder ([place])   . Chronic UTI   . Other specified disease of hair and hair follicles   . Injury, other and unspecified, knee, leg, ankle, and foot   . Chest pain, unspecified   . Dysfunction of eustachian tube   . Unspecified symptom associated with female genital organs   . Rectal  prolapse   . Enlargement of lymph nodes   . 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 Back injury   . Chronic low back pain 10/23/2013  . History of Bell's palsy Right  . MRSA (methicillin resistant staph aureus) culture positive 07-11-14    abscess    Past Surgical History  Procedure Laterality Date  . Cholecystectomy  2002  . Foot surgery Bilateral 2004    bunion  . Ganglion cyst excision  2002  . Partial hysterectomy  2010    uterus removed  . Bladder tack  2010  . Incision and drainage abscess  04-16-15    Family Psychiatric History:   Daughter- PTSD, she takes medications for the same.   Family History:  Family History  Problem Relation Age of Onset  . Parkinson's disease Maternal Grandfather   . Diabetes Father   . Cancer Father     brain tumor  . Depression Father   . Diabetes Paternal Grandfather   . Colon polyps Mother   . Heart disease Maternal Grandmother   . Heart attack Maternal Grandmother   . Stroke Maternal Grandmother   . Breast cancer Paternal Aunt   . Breast cancer Cousin   . Depression Sister   . Alcohol abuse Brother   . Depression Brother     Social History:   Social History   Social History  . Marital Status: Married    Spouse Name: N/A  . Number of Children: 3  . Years of Education: hs   Occupational History  . [place]   Social History Main Topics  . Smoking status: Current Every Day Smoker -- 1.00 packs/day for 40 years    Types: Cigarettes  . Smokeless tobacco: Never Used  . Alcohol Use: No     Comment: occasional  . Drug Use: Yes    Special: Marijuana     Comment: last used 05-20-15  . Sexual Activity: Yes    Birth Control/ Protection: None   Other Topics Concern  . None   Social History Narrative   Patient lives at home with her husband [name]).   Patient works full time.   Education CNA    Right handed.   Caffeine Tea , Soda.    Additional Social History:   Married x 24  years. Lives with husband. Has 3 children and 4 grandchildren. 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   Allergies:   Allergies  Allergen Reactions  . Other Other (See Comments)    PT states that anything with the sides effects "may cause rash" she cannot take due to her psoriasis, it will make the patches on her hand so thick she can't use her hands.   . Sulfa Antibiotics Swelling    Diarrhea,dizziness, tongue swelling  . Abilify [Aripiprazole]   . Cephalosporins Rash  . Erythromycin Other (See Comments)    Abd. pain  . Lamictal [Lamotrigine] Rash    psorasis worsen  . Risperdal [Risperidone]     tremors    Metabolic Disorder  Labs: Lab Results  Component Value Date   HGBA1C 5.8* 05/30/2015   No results found for: PROLACTIN Lab Results  Component Value Date   CHOL 169 05/30/2015   TRIG 135 05/30/2015   HDL 44 05/30/2015   CHOLHDL 3.8 05/30/2015   VLDL 36 01/15/2015   LDLCALC 98 05/30/2015   LDLCALC 70 01/15/2015     Current Medications: Current Outpatient Prescriptions  Medication Sig Dispense Refill  . cyclobenzaprine (FLEXERIL) 5 MG tablet TAKE 1 TABLET EVERY 6 HOURS AS NEEDED FOR SPASMS  0  . naproxen (NAPROSYN) 500 MG tablet TAKE 1 TABLET BY MOUTH TWICE A DAY WITH A MEAL 60 tablet 3  . QUEtiapine (SEROQUEL) 300 MG tablet Take 1 tablet (300 mg total) by mouth at bedtime. 30 tablet 0  . traMADol (ULTRAM) 50 MG tablet TAKE 2 TABLETS BY MOUTH EVERY 6 HOURS AS NEEDED. **MUST LAST 28 DAYS** 60 tablet 3   Current Facility-Administered Medications  Medication Dose Route Frequency Provider Last Rate Last Dose  . lidocaine-EPINEPHrine (XYLOCAINE W/EPI) 2 %-1:100000 (with pres) injection 1.7 mL  1.7 mL Intradermal Once Amy Lauren Krebs, NP        Neurologic: Headache: No Seizure: No Paresthesias:No  Musculoskeletal: Strength & Muscle Tone: within normal limits Gait & Station: normal Patient leans: N/A  Psychiatric Specialty Exam: ROS   Blood pressure  162/92, pulse 119, temperature 98 F (36.7 C), temperature source Tympanic, height 5' (1.524 m), weight 172 lb (78.019 kg), last menstrual period 02/09/2008, SpO2 96 %.Body mass index is 33.59 kg/(m^2).  General Appearance: Casual and Fairly Groomed  Eye Contact:  Fair  Speech:  Pressured  Volume:  Normal  Mood:  Anxious  Affect:  Congruent  Thought Process:  Circumstantial  Orientation:  Full (Time, Place, and Person)  Thought Content:  WDL  Suicidal Thoughts:  No  Homicidal Thoughts:  No  Memory:  Immediate;   Fair  Judgement:  Fair  Insight:  Fair  Psychomotor Activity:  Normal  Concentration:  Fair  Recall:  [employer] of Knowledge:Fair  Language: Fair  Akathisia:  No  Handed:  Right  AIMS (if indicated):    Assets:  Communication Skills Desire for Improvement Physical Health Social Support  ADL's:  Intact  Cognition: WNL  Sleep:      Treatment Plan Summary: Medication management   Discussed with patient about the medications treatment risks benefits and alternatives I will start her on lithium carbonate 300 mg at bedtime. I will continue her on Seroquel 300 mg at bedtime She will be given Seroquel 50 mg in the morning and 25-50 mg on a when necessary basis.    She will follow-up in 2 weeks or earlier depending on her symptoms    More than 50% of the time spent in psychoeducation, counseling and coordination of care.    This note was generated in part or whole with voice recognition software. Voice regonition is usually quite accurate but there are transcription errors that can and very often do occur. I apologize for any typographical errors that were not detected and corrected.    Rainey Pines, MD 5/3/201710:40 AM

## 2015-06-11 NOTE — Telephone Encounter (Signed)
a work note was done and is ready for pick up at front desk

## 2015-06-12 NOTE — Telephone Encounter (Signed)
Done by Janett Billow

## 2015-07-16 ENCOUNTER — Ambulatory Visit (INDEPENDENT_AMBULATORY_CARE_PROVIDER_SITE_OTHER): Payer: 59 | Admitting: Psychiatry

## 2015-07-16 ENCOUNTER — Other Ambulatory Visit: Payer: Self-pay | Admitting: Family Medicine

## 2015-07-16 VITALS — BP 150/83 | HR 96 | Temp 96.5°F | Resp 16 | Ht 60.0 in | Wt 166.8 lb

## 2015-07-16 DIAGNOSIS — F3110 Bipolar disorder, current episode manic without psychotic features, unspecified: Secondary | ICD-10-CM | POA: Diagnosis not present

## 2015-07-16 DIAGNOSIS — Z1231 Encounter for screening mammogram for malignant neoplasm of breast: Secondary | ICD-10-CM

## 2015-07-16 MED ORDER — LITHIUM CARBONATE 150 MG PO CAPS: 150.0000 mg | ORAL_CAPSULE | Freq: Every morning | ORAL | Status: DC

## 2015-07-16 MED ORDER — LITHIUM CARBONATE ER 300 MG PO TBCR: 300.0000 mg | EXTENDED_RELEASE_TABLET | Freq: Every day | ORAL | Status: DC

## 2015-07-16 MED ORDER — PROPRANOLOL HCL 10 MG PO TABS: 10.0000 mg | ORAL_TABLET | Freq: Two times a day (BID) | ORAL | Status: DC

## 2015-07-16 NOTE — Progress Notes (Signed)
Psychiatric MD Progress Note   Patient Identification: [facility] MRN:  [id number] Date of Evaluation:  07/16/2015 Referral Source: Transfer from [facility] Complaint:    Visit Diagnosis:    ICD-9-CM ICD-10-CM   1. Bipolar I disorder, most recent episode (or current) manic ([facility]) 296.40 F31.10     History of Present Illness:    Patient is a 55 year old female With history of bipolar disorder who presented for follow-up. She appeared hypomanic during the interview and was talking fast with pressured speech. She Reported that she is going to divorce her husband as they have been having conflict for the past 15 years. She reported that her husband has been using cocaine and was watching on and she is unable to tolerate this behavior. She has tried to use the Seroquel but it is making her very sedated. She has tried to use different strength of Seroquel but she is unable to tolerate the medication. Patient reported that lithium is also making her very tired. She stated that she has already spoken to her sister son and daughter about ending the relationship with her husband and they have already agreed with this plan. She stated that she has been talking to the nurse at her work and she is very supportive about her medications. Patient stated that she takes medications for her chronic pain and is on naproxen. We discussed about her medications in detail. She is agreeable to the medication adjustment as she reported that her blood pressure goes higher whenever she is agitated due to the behavior of her husband. She currently denied having any suicidal homicidal ideations or plans. She continues to have pressured speech throughout the interview.   Associated Signs/Symptoms: Depression Symptoms:  anxiety, (Hypo) Manic Symptoms:  Distractibility, Elevated Mood, Flight of Ideas, Grandiosity, Irritable Mood, Labiality of Mood, Anxiety Symptoms:  none Psychotic Symptoms:  none PTSD  Symptoms: Negative NA  Past Psychiatric History:  Patient reported that she has started following with a psychiatrist in [place] who diagnosed her with bipolar disorder in 2007. She was seen in that practice for approximately 2 years but says she owe them money she left the practice. She reported that she started following her [facility] and her medical doctor was prescribing her medications. He left them in 2009 and was admitted to [facility] as her lithium level was high. She reported that she was admitted to [facility] twice. One time it was due to manic episode and the other time due to lithium toxicity She reported that she started following at [facility] psychiatric practice. Her husband was incarcerated in 2014 and she stopped following her psychiatrist as she wanted to be manic to control her personal issues. She ran out of her medications and was not taking any medication for almost 1 year. She reported that he came out of the prison in 2015. Patient reported that she does not want to discuss about the reason he was incarcerated. However she was hospitalized 2 [facility] inpatient behavioral health unit in December 2016 due to manic behavior She was started on Seroquel and lorazepam and she responded well to the medications. She  was discharged to follow up at [facility] but she does not like the practice  Patient denied any history of suicide attempts  Previous Psychotropic Medications:  Risperdal -akathsia Abilify- h/o catatonia Lamictal Lithium - took for 6 years, but her level went higher, no side effects. [facility]  Prozac Trazodone-  Substance Abuse History in the last 12 months:  Yes.    Alcohol - occasionally MJ- Daily- Smokes 2-3 puffs  Smokes 1/2 pack per day  Consequences of Substance Abuse: Negative NA  Past Medical History:  Past Medical History  Diagnosis Date  . Depression   . Bipolar disorder ([place])   . Chronic  UTI   . Other specified disease of hair and hair follicles   . Injury, other and unspecified, knee, leg, ankle, and foot   . Chest pain, unspecified   . Dysfunction of eustachian tube   . Unspecified symptom associated with female genital organs   . Rectal prolapse   . Enlargement of lymph nodes   . 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 Back injury   . Chronic low back pain 10/23/2013  . History of Bell's palsy Right  . MRSA (methicillin resistant staph aureus) culture positive 07-11-14    abscess    Past Surgical History  Procedure Laterality Date  . Cholecystectomy  2002  . Foot surgery Bilateral 2004    bunion  . Ganglion cyst excision  2002  . Partial hysterectomy  2010    uterus removed  . Bladder tack  2010  . Incision and drainage abscess  04-16-15    Family Psychiatric History:   Daughter- PTSD, she takes medications for the same.   Family History:  Family History  Problem Relation Age of Onset  . Parkinson's disease Maternal Grandfather   . Diabetes Father   . Cancer Father     brain tumor  . Depression Father   . Diabetes Paternal Grandfather   . Colon polyps Mother   . Heart disease Maternal Grandmother   . Heart attack Maternal Grandmother   . Stroke Maternal Grandmother   . Breast cancer Paternal Aunt   . Breast cancer Cousin   . Depression Sister   . Alcohol abuse Brother   . Depression Brother     Social History:   Social History   Social History  . Marital Status: Married    Spouse Name: N/A  . Number of Children: 3  . Years of Education: hs   Occupational History  . [facility]   Social History Main Topics  . Smoking status: Current Every Day Smoker -- 1.00 packs/day for 40 years    Types: Cigarettes  . Smokeless tobacco: Never Used  . Alcohol Use: No     Comment: occasional  . Drug Use: Yes    Special: Marijuana     Comment: last used 05-20-15  . Sexual Activity: Yes    Birth Control/  Protection: None   Other Topics Concern  . Not on file   Social History Narrative   Patient lives at home with her husband [facility]).   Patient works full time.   Education CNA    Right handed.   Caffeine Tea , Soda.    Additional Social History:   Married x 24 years. Lives with husband. Has 3 children and 4 grandchildren. 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   Allergies:   Allergies  Allergen Reactions  . Other Other (See Comments)    PT states that anything with the sides effects "may cause rash" she cannot take due to her psoriasis, it will make the patches on her hand so thick she can't use her hands.   . Sulfa Antibiotics Swelling    Diarrhea,dizziness, tongue swelling  .  Abilify [Aripiprazole]   . Cephalosporins Rash  . Erythromycin Other (See Comments)    Abd. pain  . Lamictal [Lamotrigine] Rash    psorasis worsen  . Risperdal [Risperidone]     tremors    Metabolic Disorder Labs: Lab Results  Component Value Date   HGBA1C 5.8* 05/30/2015   No results found for: PROLACTIN Lab Results  Component Value Date   CHOL 169 05/30/2015   TRIG 135 05/30/2015   HDL 44 05/30/2015   CHOLHDL 3.8 05/30/2015   VLDL 36 01/15/2015   LDLCALC 98 05/30/2015   LDLCALC 70 01/15/2015     Current Medications: Current Outpatient Prescriptions  Medication Sig Dispense Refill  . cyclobenzaprine (FLEXERIL) 5 MG tablet TAKE 1 TABLET EVERY 6 HOURS AS NEEDED FOR SPASMS  0  . lithium carbonate (LITHOBID) 300 MG CR tablet Take 1 tablet (300 mg total) by mouth at bedtime. 30 tablet 0  . naproxen (NAPROSYN) 500 MG tablet TAKE 1 TABLET BY MOUTH TWICE A DAY WITH A MEAL 60 tablet 3  . QUEtiapine (SEROQUEL) 300 MG tablet Take 1 tablet (300 mg total) by mouth at bedtime. 30 tablet 0  . QUEtiapine (SEROQUEL) 50 MG tablet Take 1 tablet (50 mg total) by mouth 2 (two) times daily. 60 tablet 0   Current Facility-Administered Medications  Medication Dose Route Frequency Provider Last  Rate Last Dose  . lidocaine-EPINEPHrine (XYLOCAINE W/EPI) 2 %-1:100000 (with pres) injection 1.7 mL  1.7 mL Intradermal Once Amy Lauren Krebs, NP        Neurologic: Headache: No Seizure: No Paresthesias:No  Musculoskeletal: Strength & Muscle Tone: within normal limits Gait & Station: normal Patient leans: N/A  Psychiatric Specialty Exam: ROS   Blood pressure 150/83, pulse 96, temperature 96.5 F (35.8 C), resp. rate 16, height 5' (1.524 m), weight 166 lb 12.8 oz (75.66 kg), last menstrual period 02/09/2008, SpO2 93 %.Body mass index is 32.58 kg/(m^2).  General Appearance: Casual and Fairly Groomed  Eye Contact:  Fair  Speech:  Pressured  Volume:  Normal  Mood:  Anxious  Affect:  Congruent  Thought Process:  Circumstantial  Orientation:  Full (Time, Place, and Person)  Thought Content:  WDL  Suicidal Thoughts:  No  Homicidal Thoughts:  No  Memory:  Immediate;   Fair  Judgement:  Fair  Insight:  Fair  Psychomotor Activity:  Normal  Concentration:  Fair  Recall:  [employer] of Knowledge:Fair  Language: Fair  Akathisia:  No  Handed:  Right  AIMS (if indicated):    Assets:  Communication Skills Desire for Improvement Physical Health Social Support  ADL's:  Intact  Cognition: WNL  Sleep:      Treatment Plan Summary: Medication management   Discussed with patient about the medications treatment risks benefits and alternatives I will start her on lithium carbonate 300 mg at bedtime and 150 mg in the morning. Discontinue Seroquel I will  start her on propranolol 10 mg by mouth twice a day to help with her anxiety and her blood pressure. Discussed with her about the side effects of the medication in detail and she demonstrated understanding.  She will follow-up in 3 weeks or earlier depending on her symptoms    More than 50% of the time spent in psychoeducation, counseling and coordination of care.    This note was generated in part or whole with voice  recognition software. Voice regonition is usually quite accurate but there are transcription errors that can and very often do occur.  I apologize for any typographical errors that were not detected and corrected.    Rainey Pines, MD 6/7/20171:36 PM

## 2015-07-23 ENCOUNTER — Ambulatory Visit
Admission: RE | Admit: 2015-07-23 | Discharge: 2015-07-23 | Disposition: A | Discharge status: Home / Self Care | Payer: 59 | Source: Ambulatory Visit | Attending: Family Medicine | Admitting: Family Medicine

## 2015-07-23 DIAGNOSIS — Z1231 Encounter for screening mammogram for malignant neoplasm of breast: Secondary | ICD-10-CM | POA: Diagnosis not present

## 2015-08-12 ENCOUNTER — Other Ambulatory Visit: Payer: Self-pay | Admitting: Psychiatry

## 2015-08-28 ENCOUNTER — Other Ambulatory Visit: Payer: Self-pay

## 2015-08-28 NOTE — Telephone Encounter (Signed)
request for a refill on propranolol 10mg  pt was last seen on  07-16-15 next appt  09-10-15

## 2015-08-29 NOTE — Telephone Encounter (Signed)
called in ok for refill on medication  with no refills

## 2015-08-29 NOTE — Telephone Encounter (Signed)
pharmacy faxed request for propranolol 10mg  #[phone] .

## 2015-08-30 ENCOUNTER — Other Ambulatory Visit: Payer: Self-pay | Admitting: Psychiatry

## 2015-09-02 MED ORDER — PROPRANOLOL HCL 10 MG PO TABS: 10.0000 mg | ORAL_TABLET | Freq: Two times a day (BID) | ORAL | 0 refills | Status: DC

## 2015-09-10 ENCOUNTER — Ambulatory Visit (INDEPENDENT_AMBULATORY_CARE_PROVIDER_SITE_OTHER): Payer: 59 | Admitting: Psychiatry

## 2015-09-10 DIAGNOSIS — F603 Borderline personality disorder: Secondary | ICD-10-CM

## 2015-09-10 DIAGNOSIS — F3110 Bipolar disorder, current episode manic without psychotic features, unspecified: Secondary | ICD-10-CM | POA: Diagnosis not present

## 2015-09-10 MED ORDER — PROPRANOLOL HCL 10 MG PO TABS: 10.0000 mg | ORAL_TABLET | Freq: Two times a day (BID) | ORAL | 1 refills | Status: DC

## 2015-09-10 MED ORDER — LITHIUM CARBONATE ER 300 MG PO TBCR: 300.0000 mg | EXTENDED_RELEASE_TABLET | Freq: Every day | ORAL | 1 refills | Status: DC

## 2015-09-10 MED ORDER — LITHIUM CARBONATE 150 MG PO CAPS
150.0000 mg | ORAL_CAPSULE | Freq: Every morning | ORAL | 1 refills | Status: DC
Start: 2015-09-10 — End: 2015-11-10

## 2015-09-10 NOTE — Progress Notes (Signed)
Psychiatric MD Progress Note   Patient Identification: [facility] MRN:  [id number] Date of Evaluation:  09/10/2015 Referral Source: Transfer from [facility] Complaint:   Chief Complaint    Follow-up; Medication Refill     Visit Diagnosis:    ICD-9-CM ICD-10-CM   1. Bipolar I disorder, most recent episode (or current) manic ([place]) 296.40 F31.10   2. Borderline personality disorder 301.83 F60.3     History of Present Illness:    Patient is a 55 year old female With history of bipolar disorder who presented for follow-up. She appeared Calm and alert during the interview. She reported that she has responded well to the combination of lithium and propranolol. She reported that this is the best combination of medication. She is not experiencing any side effects. She takes propanolol half a pill in the morning. She reported that her mood  symptoms is improving. She is not having any manic symptoms and her mood is stabilizing. She had her cardiac evaluation done recently and everything is clear. She appeared very happy during the interview.  She is planning to start school in August as she works in the second shift.   Discussed about her medication detail and she denied having any side effects. She denied having any suicidal homicidal ideations or plans or perceptual disturbances. Associated Signs/Symptoms: Depression Symptoms:  anxiety, (Hypo) Manic Symptoms:  Labiality of Mood, Anxiety Symptoms:  none Psychotic Symptoms:  none PTSD Symptoms: Negative NA  Past Psychiatric History:  Patient reported that she has started following with a psychiatrist in [place] who diagnosed her with bipolar disorder in 2007. She was seen in that practice for approximately 2 years but says she owe them money she left the practice. She reported that she started following her primary care physician at [facility] and her medical doctor was prescribing her medications. He left them in 2009  and was admitted to [facility] as her lithium level was high. She reported that she was admitted to [facility] twice. One time it was due to manic episode and the other time due to lithium toxicity She reported that she started following at [facility] psychiatric practice. Her husband was incarcerated in 2014 and she stopped following her psychiatrist as she wanted to be manic to control her personal issues. She ran out of her medications and was not taking any medication for almost 1 year. She reported that he came out of the prison in 2015. Patient reported that she does not want to discuss about the reason he was incarcerated. However she was hospitalized 2 [facility] inpatient behavioral health unit in December 2016 due to manic behavior She was started on Seroquel and lorazepam and she responded well to the medications. She  was discharged to follow up at [facility] health but she does not like the practice  Patient denied any history of suicide attempts  Previous Psychotropic Medications:  Risperdal -akathsia Abilify- h/o catatonia Lamictal Lithium - took for 6 years, but her level went higher, no side effects. [facility]  Prozac Trazodone-     Substance Abuse History in the last 12 months:  Yes.    Alcohol - occasionally MJ- Daily- Smokes 2-3 puffs  Smokes 1/2 pack per day  Consequences of Substance Abuse: Negative NA  Past Medical History:  Past Medical History:  Diagnosis Date  . Back injury   . 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 Bipolar disorder ([place])   . Chest pain, unspecified   .  Chronic low back pain 10/23/2013  . Chronic UTI   . Depression   . Dysfunction of eustachian tube   . Enlargement of lymph nodes   . History of Bell's palsy Right  . Injury, other and unspecified, knee, leg, ankle, and foot   . MRSA (methicillin resistant staph aureus) culture positive 07-11-14   abscess  . Other specified disease of hair and hair follicles   . Rectal prolapse    . Unspecified symptom associated with female genital organs     Past Surgical History:  Procedure Laterality Date  . ABDOMINAL HYSTERECTOMY    . bladder tack  2010  . CHOLECYSTECTOMY  2002  . FOOT SURGERY Bilateral 2004   bunion  . GANGLION CYST EXCISION  2002  . INCISION AND DRAINAGE ABSCESS  04-16-15  . PARTIAL HYSTERECTOMY  2010   uterus removed    Family Psychiatric History:   Daughter- PTSD, she takes medications for the same.   Family History:  Family History  Problem Relation Age of Onset  . Parkinson's disease Maternal Grandfather   . Diabetes Father   . Cancer Father     brain tumor  . Depression Father   . Diabetes Paternal Grandfather   . Colon polyps Mother   . Heart disease Maternal Grandmother   . Heart attack Maternal Grandmother   . Stroke Maternal Grandmother   . Breast cancer Paternal Aunt   . Breast cancer Cousin   . Depression Sister   . Alcohol abuse Brother   . Depression Brother     Social History:   Social History   Social History  . Marital status: Married    Spouse name: N/A  . Number of children: 3  . Years of education: hs   Occupational History  . [place]   Social History Main Topics  . Smoking status: Current Every Day Smoker    Packs/day: 1.00    Years: 40.00    Types: Cigarettes  . Smokeless tobacco: Never Used  . Alcohol use No     Comment: occasional  . Drug use:     Types: Marijuana     Comment: last used 05-20-15  . Sexual activity: Yes    Birth control/ protection: None   Other Topics Concern  . Not on file   Social History Narrative   Patient lives at home with her husband [facility]).   Patient works full time.   Education CNA    Right handed.   Caffeine Tea , Soda.    Additional Social History:   Married x 24 years. Lives with husband. Has 3 children and 4 grandchildren. 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   Allergies:   Allergies   Allergen Reactions  . Other Other (See Comments)    PT states that anything with the sides effects "may cause rash" she cannot take due to her psoriasis, it will make the patches on her hand so thick she can't use her hands.   . Sulfa Antibiotics Swelling    Diarrhea,dizziness, tongue swelling  . Abilify [Aripiprazole]   . Cephalosporins Rash  . Erythromycin Other (See Comments)    Abd. pain  . Lamictal [Lamotrigine] Rash    psorasis worsen  . Risperdal [Risperidone]     tremors    Metabolic Disorder Labs: Lab Results  Component Value Date   HGBA1C 5.8 (H) 05/30/2015   No results found for: PROLACTIN Lab Results  Component  Value Date   CHOL 169 05/30/2015   TRIG 135 05/30/2015   HDL 44 05/30/2015   CHOLHDL 3.8 05/30/2015   VLDL 36 01/15/2015   LDLCALC 98 05/30/2015   LDLCALC 70 01/15/2015     Current Medications: Current Outpatient Prescriptions  Medication Sig Dispense Refill  . lithium carbonate (LITHOBID) 300 MG CR tablet Take 1 tablet (300 mg total) by mouth at bedtime. 30 tablet 1  . lithium carbonate 150 MG capsule Take 1 capsule (150 mg total) by mouth every morning. 30 capsule 1  . propranolol (INDERAL) 10 MG tablet Take 1 tablet (10 mg total) by mouth 2 (two) times daily. 60 tablet 1   No current facility-administered medications for this visit.     Neurologic: Headache: No Seizure: No Paresthesias:No  Musculoskeletal: Strength & Muscle Tone: within normal limits Gait & Station: normal Patient leans: N/A  Psychiatric Specialty Exam: ROS  Last menstrual period 02/09/2008.There is no height or weight on file to calculate BMI.  General Appearance: Casual and Fairly Groomed  Eye Contact:  Fair  Speech:  Clear and Coherent  Volume:  Normal  Mood:  Anxious  Affect:  Congruent  Thought Process:  Goal Directed  Orientation:  Full (Time, Place, and Person)  Thought Content:  WDL  Suicidal Thoughts:  No  Homicidal Thoughts:  No  Memory:  Immediate;    Fair  Judgement:  Fair  Insight:  Fair  Psychomotor Activity:  Normal  Concentration:  Fair  Recall:  [employer] of Knowledge:Fair  Language: Fair  Akathisia:  No  Handed:  Right  AIMS (if indicated):    Assets:  Communication Skills Desire for Improvement Physical Health Social Support  ADL's:  Intact  Cognition: WNL  Sleep:      Treatment Plan Summary: Medication management   Discussed with patient about the medications treatment risks benefits and alternatives Continue  lithium carbonate 300 mg at bedtime and 150 mg in the morning. Continue propranolol 10 mg by mouth twice a day to help with her anxiety . Discussed with her about the side effects of the medication in detail and she demonstrated understanding.  She will follow-up in 2 months  or earlier depending on her symptoms    More than 50% of the time spent in psychoeducation, counseling and coordination of care.    This note was generated in part or whole with voice recognition software. Voice regonition is usually quite accurate but there are transcription errors that can and very often do occur. I apologize for any typographical errors that were not detected and corrected.    Rainey Pines, MD 8/2/201712:18 PM

## 2015-11-10 ENCOUNTER — Ambulatory Visit (INDEPENDENT_AMBULATORY_CARE_PROVIDER_SITE_OTHER): Payer: 59 | Admitting: Psychiatry

## 2015-11-10 ENCOUNTER — Encounter: Payer: Self-pay | Admitting: Psychiatry

## 2015-11-10 VITALS — BP 126/78 | HR 108 | Temp 98.5°F | Wt 163.6 lb

## 2015-11-10 DIAGNOSIS — F603 Borderline personality disorder: Secondary | ICD-10-CM

## 2015-11-10 DIAGNOSIS — F3110 Bipolar disorder, current episode manic without psychotic features, unspecified: Secondary | ICD-10-CM

## 2015-11-10 MED ORDER — QUETIAPINE FUMARATE 25 MG PO TABS: ORAL_TABLET | ORAL | 1 refills | Status: DC

## 2015-11-10 MED ORDER — PROPRANOLOL HCL 10 MG PO TABS: 10.0000 mg | ORAL_TABLET | Freq: Two times a day (BID) | ORAL | 1 refills | Status: DC

## 2015-11-10 MED ORDER — LITHIUM CARBONATE ER 300 MG PO TBCR: 300.0000 mg | EXTENDED_RELEASE_TABLET | Freq: Every day | ORAL | 1 refills | Status: DC

## 2015-11-10 NOTE — Progress Notes (Signed)
Psychiatric MD Progress Note   Patient Identification: Sheryl Perez MRN:  [id number] Date of Evaluation:  11/10/2015 Referral Source: Transfer from [employer] Complaint:    Visit Diagnosis:    ICD-9-CM ICD-10-CM   1. Bipolar I disorder, most recent episode (or current) manic ([place]) 296.40 F31.10   2. Borderline personality disorder 301.83 F60.3     History of Present Illness:    Patient is a 55 year old female with history of bipolar disorder who presented for follow-up Today by her husband. She reported that she does not feel manic at this time but feels that she might be having some depressive symptoms. She reported that occasionally she will throw up in the afternoon. She reported that she works in the second shift and her husband works in the third shift. They will have a big meal in the morning. Patient also reported that she drinks multiple bottles of [facility] throughout the day. She is unable to sleep at night due to racing thoughts. Her husband also reported that she drinks caffeinated [facility] throughout the day. Patient. Calm and alert during the interview. She reported that she is not having any mania and has been compliant with her lithium but she forgets to take it at night sometimes. She did not take the medication last night. She also is interested in having her lab work at this time. Her husband appears supportive. She denied using any drugs at this time.   She denied having any suicidal ideations or plans. She denied having any perceptual disturbances.    Discussed about her medications and the patient is receptive to starting the Seroquel again at a lower dose.   Associated Signs/Symptoms: Depression Symptoms:  anxiety, (Hypo) Manic Symptoms:  Labiality of Mood, Anxiety Symptoms:  none Psychotic Symptoms:  none PTSD Symptoms: Negative NA  Past Psychiatric History:  Patient reported that she has started following with a psychiatrist in [place] who  diagnosed her with bipolar disorder in 2007. She was seen in that practice for approximately 2 years but says she owe them money she left the practice. She reported that she started following her primary care physician at [facility] dates and neck and her medical doctor was prescribing her medications. He left them in 2009 and was admitted to [facility] as her lithium level was high. She reported that she was admitted to [facility] twice. One time it was due to manic episode and the other time due to lithium toxicity She reported that she started following at [facility] psychiatric practice. Her husband was incarcerated in 2014 and she stopped following her psychiatrist as she wanted to be manic to control her personal issues. She ran out of her medications and was not taking any medication for almost 1 year. She reported that he came out of the prison in 2015. Patient reported that she does not want to discuss about the reason he was incarcerated. However she was hospitalized 2 [facility] inpatient behavioral health unit in December 2016 due to manic behavior She was started on Seroquel and lorazepam and she responded well to the medications. She  was discharged to follow up at [facility] health but she does not like the practice  Patient denied any history of suicide attempts  Previous Psychotropic Medications:  Risperdal -akathsia Abilify- h/o catatonia Lamictal Lithium - took for 6 years, but her level went higher, no side effects. [facility]  Prozac Trazodone-     Substance Abuse History in the last 12 months:  Yes.  Alcohol - occasionally MJ- Daily- Smokes 2-3 puffs  Smokes 1/2 pack per day  Consequences of Substance Abuse: Negative NA  Past Medical History:  Past Medical History:  Diagnosis Date  . Back injury   . 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 Bipolar disorder ([place])   . Chest pain, unspecified   . Chronic low back pain 10/23/2013  . Chronic UTI   . Depression    . Dysfunction of eustachian tube   . Enlargement of lymph nodes   . History of Bell's palsy Right  . Injury, other and unspecified, knee, leg, ankle, and foot   . MRSA (methicillin resistant staph aureus) culture positive 07-11-14   abscess  . Other specified disease of hair and hair follicles   . Rectal prolapse   . Unspecified symptom associated with female genital organs     Past Surgical History:  Procedure Laterality Date  . ABDOMINAL HYSTERECTOMY    . bladder tack  2010  . CHOLECYSTECTOMY  2002  . FOOT SURGERY Bilateral 2004   bunion  . GANGLION CYST EXCISION  2002  . INCISION AND DRAINAGE ABSCESS  04-16-15  . PARTIAL HYSTERECTOMY  2010   uterus removed    Family Psychiatric History:   Daughter- PTSD, she takes medications for the same.   Family History:  Family History  Problem Relation Age of Onset  . Parkinson's disease Maternal Grandfather   . Diabetes Father   . Cancer Father     brain tumor  . Depression Father   . Diabetes Paternal Grandfather   . Colon polyps Mother   . Heart disease Maternal Grandmother   . Heart attack Maternal Grandmother   . Stroke Maternal Grandmother   . Breast cancer Paternal Aunt   . Breast cancer Cousin   . Depression Sister   . Alcohol abuse Brother   . Depression Brother     Social History:   Social History   Social History  . Marital status: Married    Spouse name: N/A  . Number of children: 3  . Years of education: hs   Occupational History  . [employer]   Social History Main Topics  . Smoking status: Current Every Day Smoker    Packs/day: 1.00    Years: 40.00    Types: Cigarettes  . Smokeless tobacco: Never Used  . Alcohol use No     Comment: occasional  . Drug use:     Types: Marijuana     Comment: last used 05-20-15  . Sexual activity: Yes    Birth control/ protection: None   Other Topics Concern  . Not on file   Social History Narrative   Patient  lives at home with her husband Sheryl Perez).   Patient works full time.   Education CNA    Right handed.   Caffeine Tea , Soda.    Additional Social History:   Married x 24 years. Lives with husband. Has 3 children and 4 grandchildren. 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   Allergies:   Allergies  Allergen Reactions  . Other Other (See Comments)    PT states that anything with the sides effects "may cause rash" she cannot take due to her psoriasis, it will make the patches on her hand so thick she can't use her hands.   . Sulfa Antibiotics Swelling    Diarrhea,dizziness, tongue swelling  . Abilify [Aripiprazole]   .  Cephalosporins Rash  . Erythromycin Other (See Comments)    Abd. pain  . Lamictal [Lamotrigine] Rash    psorasis worsen  . Risperdal [Risperidone]     tremors    Metabolic Disorder Labs: Lab Results  Component Value Date   HGBA1C 5.8 (H) 05/30/2015   No results found for: PROLACTIN Lab Results  Component Value Date   CHOL 169 05/30/2015   TRIG 135 05/30/2015   HDL 44 05/30/2015   CHOLHDL 3.8 05/30/2015   VLDL 36 01/15/2015   LDLCALC 98 05/30/2015   LDLCALC 70 01/15/2015     Current Medications: Current Outpatient Prescriptions  Medication Sig Dispense Refill  . lithium carbonate (LITHOBID) 300 MG CR tablet Take 1 tablet (300 mg total) by mouth at bedtime. 30 tablet 1  . lithium carbonate 150 MG capsule Take 1 capsule (150 mg total) by mouth every morning. 30 capsule 1  . propranolol (INDERAL) 10 MG tablet Take 1 tablet (10 mg total) by mouth 2 (two) times daily. 60 tablet 1   No current facility-administered medications for this visit.     Neurologic: Headache: No Seizure: No Paresthesias:No  Musculoskeletal: Strength & Muscle Tone: within normal limits Gait & Station: normal Patient leans: N/A  Psychiatric Specialty Exam: ROS  Last menstrual period 02/09/2008.There is no height or weight on file to calculate BMI.  General  Appearance: Casual and Fairly Groomed  Eye Contact:  Fair  Speech:  Clear and Coherent  Volume:  Normal  Mood:  Anxious  Affect:  Congruent  Thought Process:  Goal Directed  Orientation:  Full (Time, Place, and Person)  Thought Content:  WDL  Suicidal Thoughts:  No  Homicidal Thoughts:  No  Memory:  Immediate;   Fair  Judgement:  Fair  Insight:  Fair  Psychomotor Activity:  Normal  Concentration:  Fair  Recall:  [employer] of Knowledge:Fair  Language: Fair  Akathisia:  No  Handed:  Right  AIMS (if indicated):    Assets:  Communication Skills Desire for Improvement Physical Health Social Support  ADL's:  Intact  Cognition: WNL  Sleep:      Treatment Plan Summary: Medication management   Discussed with patient about the medications treatment risks benefits and alternatives Continue  lithium carbonate 300 mg at bedtime. Continue propranolol 10 mg by mouth twice a day to help with her anxiety .  I will start her on Seroquel 25-50 mg by mouth daily at bedtime when necessary for mood  symptoms.   She  was given lab requisition form to have her labs done  at this time. Discussed with her about the side effects of the medication in detail and she demonstrated understanding.  She will follow-up in 1 mont or earlier depending on her symptoms    More than 50% of the time spent in psychoeducation, counseling and coordination of care.    This note was generated in part or whole with voice recognition software. Voice regonition is usually quite accurate but there are transcription errors that can and very often do occur. I apologize for any typographical errors that were not detected and corrected.    Rainey Pines, MD 10/2/201711:50 AM

## 2015-12-02 ENCOUNTER — Other Ambulatory Visit
Admission: RE | Admit: 2015-12-02 | Discharge: 2015-12-02 | Disposition: A | Discharge status: Home / Self Care | Payer: 59 | Source: Ambulatory Visit | Attending: Psychiatry | Admitting: Psychiatry

## 2015-12-02 DIAGNOSIS — F3110 Bipolar disorder, current episode manic without psychotic features, unspecified: Secondary | ICD-10-CM | POA: Diagnosis present

## 2015-12-02 LAB — CBC
HCT: 40 % (ref 35.0–47.0)
Hemoglobin: 13.9 g/dL (ref 12.0–16.0)
MCH: 31.9 pg (ref 26.0–34.0)
MCHC: 34.9 g/dL (ref 32.0–36.0)
MCV: 91.6 fL (ref 80.0–100.0)
Platelets: 321 10*3/uL (ref 150–440)
RBC: 4.37 MIL/uL (ref 3.80–5.20)
RDW: 13.7 % (ref 11.5–14.5)
WBC: 8.1 10*3/uL (ref 3.6–11.0)

## 2015-12-02 LAB — COMPREHENSIVE METABOLIC PANEL
ALT: 25 U/L (ref 14–54)
AST: 30 U/L (ref 15–41)
Albumin: 3.9 g/dL (ref 3.5–5.0)
Alkaline Phosphatase: 89 U/L (ref 38–126)
Anion gap: 9 (ref 5–15)
BUN: 7 mg/dL (ref 6–20)
CO2: 24 mmol/L (ref 22–32)
Calcium: 9.2 mg/dL (ref 8.9–10.3)
Chloride: 109 mmol/L (ref 101–111)
Creatinine, Ser: 1.03 mg/dL — ABNORMAL HIGH (ref 0.44–1.00)
GFR calc Af Amer: >60 mL/min (ref >60)
GFR calc non Af Amer: >60 mL/min (ref >60)
Glucose, Bld: 98 mg/dL (ref 65–99)
Potassium: 3.3 mmol/L — ABNORMAL LOW (ref 3.5–5.1)
Sodium: 142 mmol/L (ref 135–145)
Total Bilirubin: 0.2 mg/dL — ABNORMAL LOW (ref 0.3–1.2)
Total Protein: 7.5 g/dL (ref 6.5–8.1)

## 2015-12-02 LAB — LITHIUM LEVEL: Lithium Lvl: <0.06 mmol/L — ABNORMAL LOW (ref 0.60–1.20)

## 2015-12-02 LAB — TSH: TSH: 1.682 u[IU]/mL (ref 0.350–4.500)

## 2015-12-11 ENCOUNTER — Ambulatory Visit (INDEPENDENT_AMBULATORY_CARE_PROVIDER_SITE_OTHER): Payer: 59 | Admitting: Psychiatry

## 2015-12-11 ENCOUNTER — Encounter: Payer: Self-pay | Admitting: Psychiatry

## 2015-12-11 VITALS — BP 127/76 | HR 99 | Temp 98.6°F | Wt 158.4 lb

## 2015-12-11 DIAGNOSIS — F603 Borderline personality disorder: Secondary | ICD-10-CM | POA: Diagnosis not present

## 2015-12-11 DIAGNOSIS — F3110 Bipolar disorder, current episode manic without psychotic features, unspecified: Secondary | ICD-10-CM | POA: Diagnosis not present

## 2015-12-11 MED ORDER — PROPRANOLOL HCL 10 MG PO TABS: 10.0000 mg | ORAL_TABLET | Freq: Two times a day (BID) | ORAL | 1 refills | Status: DC

## 2015-12-11 MED ORDER — LITHIUM CARBONATE 150 MG PO CAPS: 150.0000 mg | ORAL_CAPSULE | Freq: Every day | ORAL | 1 refills | Status: DC

## 2015-12-11 NOTE — Progress Notes (Signed)
Psychiatric MD Progress Note   Patient Identification: Sheryl Perez MRN:  [id number] Date of Evaluation:  12/11/2015 Referral Source: Transfer from [employer] Complaint:   Chief Complaint    Follow-up; Medication Refill     Visit Diagnosis:    ICD-9-CM ICD-10-CM   1. Bipolar I disorder, most recent episode (or current) manic ([place]) 296.40 F31.10   2. Borderline personality disorder 301.83 F60.3     History of Present Illness:    Patient is a 55 year old female with history of bipolar disorder who presented for follow-up accompanied  by her husband. She reported that she Has already stopped taking the lithium as she was feeling tired on the medication. She reported that there is no problem with the [facility] and she continues to drink it on a regular basis. She has not even tried the propranolol. She reported that the Seroquel is making her very tired. She continues to feel anxious and jittery. Her husband was very supportive of her symptoms. Patient reported that he wants medication to help with her anxiety and mood symptoms. We discussed about her medication compliance and then her husband started yelling at me in the office when we discussed that the patient has been adjusting her own medication and has not been taking the medication as prescribed.   Discussed with patient that she needs to continue taking her medications as prescribed and to call the office if she notices worsening of her symptoms or side effects of the medication.  She denied having any suicidal ideations or plans. She denied having any perceptual disturbances.    Associated Signs/Symptoms: Depression Symptoms:  anxiety, (Hypo) Manic Symptoms:  Labiality of Mood, Anxiety Symptoms:  none Psychotic Symptoms:  none PTSD Symptoms: Negative NA  Past Psychiatric History:  Patient reported that she has started following with a psychiatrist in [place] who diagnosed her with bipolar disorder in 2007. She  was seen in that practice for approximately 2 years but says she owe them money she left the practice. She reported that she started following her primary care physician at [facility] dates and neck and her medical doctor was prescribing her medications. He left them in 2009 and was admitted to [facility] as her lithium level was high. She reported that she was admitted to [facility] twice. One time it was due to manic episode and the other time due to lithium toxicity She reported that she started following at [facility] psychiatric practice. Her husband was incarcerated in 2014 and she stopped following her psychiatrist as she wanted to be manic to control her personal issues. She ran out of her medications and was not taking any medication for almost 1 year. She reported that he came out of the prison in 2015. Patient reported that she does not want to discuss about the reason he was incarcerated. However she was hospitalized 2 [facility] inpatient behavioral health unit in December 2016 due to manic behavior She was started on Seroquel and lorazepam and she responded well to the medications. She  was discharged to follow up at [facility] health but she does not like the practice  Patient denied any history of suicide attempts  Previous Psychotropic Medications:  Risperdal -akathsia Abilify- h/o catatonia Lamictal Lithium - took for 6 years, but her level went higher, no side effects. [facility]  Prozac Trazodone-     Substance Abuse History in the last 12 months:  Yes.    Alcohol - occasionally MJ- Daily- Smokes 2-3 puffs  Smokes 1/2  pack per day  Consequences of Substance Abuse: Negative NA  Past Medical History:  Past Medical History:  Diagnosis Date  . Back injury   . 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 Bipolar disorder ([place])   . Chest pain, unspecified   . Chronic low back pain 10/23/2013  . Chronic UTI   . Depression   . Dysfunction of eustachian tube   .  Enlargement of lymph nodes   . History of Bell's palsy Right  . Injury, other and unspecified, knee, leg, ankle, and foot   . MRSA (methicillin resistant staph aureus) culture positive 07-11-14   abscess  . Other specified disease of hair and hair follicles   . Rectal prolapse   . Unspecified symptom associated with female genital organs     Past Surgical History:  Procedure Laterality Date  . ABDOMINAL HYSTERECTOMY    . bladder tack  2010  . CHOLECYSTECTOMY  2002  . FOOT SURGERY Bilateral 2004   bunion  . GANGLION CYST EXCISION  2002  . INCISION AND DRAINAGE ABSCESS  04-16-15  . PARTIAL HYSTERECTOMY  2010   uterus removed    Family Psychiatric History:   Daughter- PTSD, she takes medications for the same.   Family History:  Family History  Problem Relation Age of Onset  . Parkinson's disease Maternal Grandfather   . Diabetes Father   . Cancer Father     brain tumor  . Depression Father   . Diabetes Paternal Grandfather   . Colon polyps Mother   . Heart disease Maternal Grandmother   . Heart attack Maternal Grandmother   . Stroke Maternal Grandmother   . Breast cancer Paternal Aunt   . Breast cancer Cousin   . Depression Sister   . Alcohol abuse Brother   . Depression Brother     Social History:   Social History   Social History  . Marital status: Married    Spouse name: N/A  . Number of children: 3  . Years of education: hs   Occupational History  . [employer]   Social History Main Topics  . Smoking status: Current Every Day Smoker    Packs/day: 1.00    Years: 40.00    Types: Cigarettes  . Smokeless tobacco: Never Used  . Alcohol use No     Comment: occasional  . Drug use:     Types: Marijuana     Comment: last used 05-20-15  . Sexual activity: Yes    Birth control/ protection: None   Other Topics Concern  . None   Social History Narrative   Patient lives at home with her husband Sheryl Perez).    Patient works full time.   Education CNA    Right handed.   Caffeine Tea , Soda.    Additional Social History:   Married x 24 years. Lives with husband. Has 3 children and 4 grandchildren. 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   Allergies:   Allergies  Allergen Reactions  . Other Other (See Comments)    PT states that anything with the sides effects "may cause rash" she cannot take due to her psoriasis, it will make the patches on her hand so thick she can't use her hands.   . Sulfa Antibiotics Swelling    Diarrhea,dizziness, tongue swelling  . Abilify [Aripiprazole]   . Cephalosporins Rash  . Erythromycin Other (See Comments)    Abd.  pain  . Lamictal [Lamotrigine] Rash    psorasis worsen  . Risperdal [Risperidone]     tremors    Metabolic Disorder Labs: Lab Results  Component Value Date   HGBA1C 5.8 (H) 05/30/2015   No results found for: PROLACTIN Lab Results  Component Value Date   CHOL 169 05/30/2015   TRIG 135 05/30/2015   HDL 44 05/30/2015   CHOLHDL 3.8 05/30/2015   VLDL 36 01/15/2015   LDLCALC 98 05/30/2015   LDLCALC 70 01/15/2015     Current Medications: Current Outpatient Prescriptions  Medication Sig Dispense Refill  . lithium carbonate (LITHOBID) 300 MG CR tablet Take 1 tablet (300 mg total) by mouth at bedtime. 30 tablet 1  . propranolol (INDERAL) 10 MG tablet Take 1 tablet (10 mg total) by mouth 2 (two) times daily. 60 tablet 1  . QUEtiapine (SEROQUEL) 25 MG tablet 1-2 pills po qhs 60 tablet 1   No current facility-administered medications for this visit.     Neurologic: Headache: No Seizure: No Paresthesias:No  Musculoskeletal: Strength & Muscle Tone: within normal limits Gait & Station: normal Patient leans: N/A  Psychiatric Specialty Exam: ROS  Blood pressure 127/76, pulse 99, temperature 98.6 F (37 C), temperature source Oral, weight 158 lb 6.4 oz (71.8 kg), last menstrual period 02/09/2008.Body mass index is 30.94 kg/m.   General Appearance: Casual and Fairly Groomed  Eye Contact:  Fair  Speech:  Clear and Coherent  Volume:  Normal  Mood:  Anxious  Affect:  Congruent  Thought Process:  Goal Directed  Orientation:  Full (Time, Place, and Person)  Thought Content:  WDL  Suicidal Thoughts:  No  Homicidal Thoughts:  No  Memory:  Immediate;   Fair  Judgement:  Fair  Insight:  Fair  Psychomotor Activity:  Normal  Concentration:  Fair  Recall:  [employer] of Knowledge:Fair  Language: Fair  Akathisia:  No  Handed:  Right  AIMS (if indicated):    Assets:  Communication Skills Desire for Improvement Physical Health Social Support  ADL's:  Intact  Cognition: WNL  Sleep:      Treatment Plan Summary: Medication management   Discussed with patient about the medications treatment risks benefits and alternatives Continue  lithium 150 mg at bedtime. Continue propranolol 10 mg by mouth twice a day to help with her anxiety .  I will start her on Seroquel 12.5  mg by mouth daily at bedtime when necessary for mood  symptoms.     She will follow-up in 1 month or earlier depending on her symptoms.  Advised patient that I will be leaving this office in the end of November and she demonstrated understanding    More than 50% of the time spent in psychoeducation, counseling and coordination of care.    This note was generated in part or whole with voice recognition software. Voice regonition is usually quite accurate but there are transcription errors that can and very often do occur. I apologize for any typographical errors that were not detected and corrected.    Rainey Pines, MD 11/2/201710:06 AM

## 2015-12-30 ENCOUNTER — Telehealth: Payer: Self-pay

## 2015-12-30 NOTE — Telephone Encounter (Signed)
Medication management - Patient requests Dr. Gretel Acre contact her back about decreasing one of her medications and stated Dr. Gretel Acre wanted her to discuss any changes with her before cutting back.

## 2016-01-06 ENCOUNTER — Telehealth: Payer: Self-pay | Admitting: *Deleted

## 2016-01-06 ENCOUNTER — Telehealth: Payer: Self-pay | Admitting: Psychiatry

## 2016-01-06 NOTE — Telephone Encounter (Signed)
Pt dropped FMLA forms to be signed. Advised her to start IOP in [place]. She wants to see Dr Einar Grad before going to IOP in [place].

## 2016-01-06 NOTE — Telephone Encounter (Signed)
Attempted to call her back. Home number had message that there was no mailbox set up to leave message. I called her at her work number but she got very angry and asked why I would call her at work. I explained that she doesn't have a mailbox set up at her home number and she provided this number as an alternative. Writer asked that she call back at her earliest convenience

## 2016-01-07 ENCOUNTER — Telehealth: Payer: Self-pay | Admitting: *Deleted

## 2016-01-07 NOTE — Telephone Encounter (Signed)
Writer called pt to discuss Dr. Gretel Acre and Dr. Josefa Half request that she go to IOP at [facility] in [place] vs coming here for therapy. Pt upset with Probation officer stating, "I dont need that stuff, I talked to a nurse I work with yesterday and she said she dudi not feel like I needed that crap.". Writer explained that Dr. Gretel Acre is more qualified to make that determination, but she can follow the recommendation or not if she does not want to go to IOP. Pt requested writer to remove her from the schedule because she wants to go somewhere else. Writer asked what she wanted him to do with the paperwork she left to be filled out and she asked that we discard it, she would get more. Appointment for next week cancelled.

## 2016-01-12 ENCOUNTER — Ambulatory Visit: Payer: 59 | Admitting: Psychiatry

## 2016-01-13 ENCOUNTER — Ambulatory Visit: Payer: 59 | Admitting: Psychiatry

## 2016-01-14 ENCOUNTER — Other Ambulatory Visit: Payer: Self-pay | Admitting: Gastroenterology

## 2016-01-14 DIAGNOSIS — R1011 Right upper quadrant pain: Secondary | ICD-10-CM

## 2016-01-14 DIAGNOSIS — R1013 Epigastric pain: Secondary | ICD-10-CM

## 2016-01-19 ENCOUNTER — Ambulatory Visit
Admission: RE | Admit: 2016-01-19 | Discharge: 2016-01-19 | Disposition: A | Discharge status: Home / Self Care | Payer: 59 | Source: Ambulatory Visit | Attending: Gastroenterology | Admitting: Gastroenterology

## 2016-01-19 DIAGNOSIS — R1013 Epigastric pain: Secondary | ICD-10-CM

## 2016-01-19 DIAGNOSIS — K760 Fatty (change of) liver, not elsewhere classified: Secondary | ICD-10-CM | POA: Insufficient documentation

## 2016-01-19 DIAGNOSIS — R1011 Right upper quadrant pain: Secondary | ICD-10-CM

## 2016-02-03 ENCOUNTER — Other Ambulatory Visit: Payer: Self-pay | Admitting: Psychiatry

## 2016-02-04 ENCOUNTER — Other Ambulatory Visit: Payer: Self-pay | Admitting: Psychiatry

## 2016-03-10 ENCOUNTER — Other Ambulatory Visit: Payer: Self-pay | Admitting: Psychiatry

## 2016-03-18 ENCOUNTER — Encounter: Payer: Self-pay | Admitting: Family Medicine

## 2016-03-18 ENCOUNTER — Ambulatory Visit (INDEPENDENT_AMBULATORY_CARE_PROVIDER_SITE_OTHER): Payer: 59 | Admitting: Family Medicine

## 2016-03-18 VITALS — BP 118/54 | HR 92 | Temp 98.2°F | Resp 16 | Ht 60.0 in | Wt 171.0 lb

## 2016-03-18 DIAGNOSIS — J0110 Acute frontal sinusitis, unspecified: Secondary | ICD-10-CM | POA: Diagnosis not present

## 2016-03-18 DIAGNOSIS — J302 Other seasonal allergic rhinitis: Secondary | ICD-10-CM

## 2016-03-18 DIAGNOSIS — J309 Allergic rhinitis, unspecified: Secondary | ICD-10-CM | POA: Insufficient documentation

## 2016-03-18 MED ORDER — FLUTICASONE PROPIONATE 50 MCG/ACT NA SUSP: 2.0000 | Freq: Every day | NASAL | 3 refills | Status: DC

## 2016-03-18 MED ORDER — AMOXICILLIN-POT CLAVULANATE 875-125 MG PO TABS: 1.0000 | ORAL_TABLET | Freq: Two times a day (BID) | ORAL | 0 refills | Status: DC

## 2016-03-18 NOTE — Patient Instructions (Signed)
Thank you for coming in to clinic today.  1. It sounds like you have a Sinusitis (Bacterial Infection) - this most likely started as an Upper Respiratory Virus that has settled into an infection. Allergies can also cause this. - Start Augmentin 1 pill twice daily (breakfast and dinner, with food and plenty of water) for 10 days, complete entire course, do not stop early even if feeling better - Start Cetirizine (Zyrtec) 10mg  daily and Flonase 2 sprays in each nostril daily for next 4-6 weeks, then you may stop and use seasonally or as needed - Start Mucinex-DM 2-3 times a day for 7 days - Recommend to try using Nasal Saline spray multiple times a day to help flush out congestion and clear sinuses  - Improve hydration by drinking plenty of clear fluids (water, gatorade) to reduce secretions and thin congestion - Congestion draining down throat can cause irritation. May try warm herbal tea with honey, cough drops - Can take Tylenol or Ibuprofen as needed for fevers  If you develop persistent fever >101F for at least 3 consecutive days, headaches with sinus pain or pressure or persistent earache, please schedule a follow-up evaluation within next few days to week.  Please schedule a follow-up appointment with Dr. Parks Ranger in 2 weeks as needed for sinusitis  If you have any other questions or concerns, please feel free to call the clinic or send a message through [facility]. You may also schedule an earlier appointment if necessary.  Nobie Putnam, DO [place]

## 2016-03-18 NOTE — Progress Notes (Signed)
Subjective:    Patient ID: Sheryl Perez, female    DOB: Jul 04, 1960, 56 y.o.   MRN: [id number]  Sheryl Perez is a 56 y.o. female presenting on 03/18/2016 for Sinusitis (HA coughing onset week)  HPI  SINUSITIS Reports symptoms started 1 week ago with frontal sinus congestion and pressure with worsening now with some productive thicker cough, worse in morning and when lay down. - Sick contact with son having sinusitis and AOM recently treated, and granddaughter with prior ear infection few weeks ago - Reports variety of antibiotic allergies including erythromycin, cephalosporin, sulfa drugs, but has tolerated PCN and Augmentin well in past by report and chart review, last 04/2015 for UTI did well on augmentin - Admits chills - Denies any fevers, nausea, vomiting, muscle aches, ear pain or pressure  Social History  Substance Use Topics  . Smoking status: Current Every Day Smoker    Packs/day: 1.00    Years: 40.00    Types: Cigarettes  . Smokeless tobacco: Never Used  . Alcohol use No     Comment: occasional    Review of Systems Per HPI unless specifically indicated above     Objective:    BP (!) 118/54   Pulse 92   Temp 98.2 F (36.8 C) (Oral)   Resp 16   Ht 5' (1.524 m)   Wt 171 lb (77.6 kg)   LMP 02/09/2008   SpO2 98%   BMI 33.40 kg/m   Wt Readings from Last 3 Encounters:  03/18/16 171 lb (77.6 kg)  06/03/15 176 lb (79.8 kg)  04/23/15 180 lb (81.6 kg)    Physical Exam  Constitutional: She appears well-developed and well-nourished. No distress.  Well-appearing, comfortable, cooperative  HENT:  Head: Normocephalic and atraumatic.  Moderate frontal sinus tenderness, minimal maxillary tenderness. Nares with some turbinate edema and congestion without purulence. Bilateral TMs clear without erythema, effusion. R TM appears to have some fullness without obvious bulging. Oropharynx clear except mild generalized posterior pharynx erythema from nasal drainage. No  evidence exudates, edema or asymmetry.  Eyes: Conjunctivae are normal. Right eye exhibits no discharge. Left eye exhibits no discharge.  Neck: Normal range of motion. Neck supple.  Cardiovascular: Normal rate, regular rhythm, normal heart sounds and intact distal pulses.   No murmur heard. Pulmonary/Chest: Effort normal and breath sounds normal. No respiratory distress. She has no wheezes. She has no rales.  Good air movement.  Musculoskeletal: She exhibits no edema.  Lymphadenopathy:    She has no cervical adenopathy.  Neurological: She is alert.  Skin: Skin is warm and dry. No rash noted. She is not diaphoretic. No erythema.  Psychiatric: Her behavior is normal.  Nursing note and vitals reviewed.    I have personally reviewed the following lab results from 11/2015.  Results for orders placed or performed during the hospital encounter of 12/02/15  Comprehensive metabolic panel  Result Value Ref Range   Sodium 142 135 - 145 mmol/L   Potassium 3.3 (L) 3.5 - 5.1 mmol/L   Chloride 109 101 - 111 mmol/L   CO2 24 22 - 32 mmol/L   Glucose, Bld 98 65 - 99 mg/dL   BUN 7 6 - 20 mg/dL   Creatinine, Ser 1.03 (H) 0.44 - 1.00 mg/dL   Calcium 9.2 8.9 - 10.3 mg/dL   Total Protein 7.5 6.5 - 8.1 g/dL   Albumin 3.9 3.5 - 5.0 g/dL   AST 30 15 - 41 U/L   ALT 25 14 - 54  U/L   Alkaline Phosphatase 89 38 - 126 U/L   Total Bilirubin 0.2 (L) 0.3 - 1.2 mg/dL   GFR calc non Af Amer >60 >60 mL/min   GFR calc Af Amer >60 >60 mL/min   Anion gap 9 5 - 15  CBC  Result Value Ref Range   WBC 8.1 3.6 - 11.0 K/uL   RBC 4.37 3.80 - 5.20 MIL/uL   Hemoglobin 13.9 12.0 - 16.0 g/dL   HCT 40.0 35.0 - 47.0 %   MCV 91.6 80.0 - 100.0 fL   MCH 31.9 26.0 - 34.0 pg   MCHC 34.9 32.0 - 36.0 g/dL   RDW 13.7 11.5 - 14.5 %   Platelets 321 150 - 440 K/uL  Lithium level  Result Value Ref Range   Lithium Lvl <0.06 (L) 0.60 - 1.20 mmol/L  TSH  Result Value Ref Range   TSH 1.682 0.350 - 4.500 uIU/mL      Assessment &  Plan:   Problem List Items Addressed This Visit    Allergic rhinitis    Secondary component to current infectious sinusitis, in setting of chronic allergic rhinitis. -Worse season is Spring  Plan: 1. Treat sinusitis with antibiotics 2. Start Flonase 2 sprays in each nare daily for 4-6 weeks likely need longer or repeat 3. Start home OTC Zyrtec 10mg  daily now prior to Spring allergy season      Relevant Medications   fluticasone (FLONASE) 50 MCG/ACT nasal spray    Other Visit Diagnoses    Acute non-recurrent frontal sinusitis    -  Primary  Consistent with acute frontal sinusitis, likely initially viral URI vs allergic rhinitis component with worsening concern for bacterial infection. Multiple sick contacts  Plan: 1. Start Augmentin 875-125mg  PO BID x 10 days 2. Start OTC Zyrtec 10mg  daily and rx Flonase 2 sprays in each nostril daily for next 4-6 weeks, then may stop and use seasonally or as needed 3. Supportive care with nasal saline OTC, hydration, OTC Mucinex DM 7 days 4. Return criteria reviewed     Relevant Medications   amoxicillin-clavulanate (AUGMENTIN) 875-125 MG tablet   fluticasone (FLONASE) 50 MCG/ACT nasal spray      Meds ordered this encounter  Medications  . amoxicillin-clavulanate (AUGMENTIN) 875-125 MG tablet    Sig: Take 1 tablet by mouth 2 (two) times daily.    Dispense:  20 tablet    Refill:  0  . fluticasone (FLONASE) 50 MCG/ACT nasal spray    Sig: Place 2 sprays into both nostrils daily. Use for 4-6 weeks then stop and use seasonally or as needed.    Dispense:  16 g    Refill:  3      Follow up plan: Return in about 2 weeks (around 04/01/2016), or if symptoms worsen or fail to improve, for sinusitis.  Nobie Putnam, [facility] 03/18/2016, 4:58 PM

## 2016-03-18 NOTE — Assessment & Plan Note (Signed)
Secondary component to current infectious sinusitis, in setting of chronic allergic rhinitis. -Worse season is Spring  Plan: 1. Treat sinusitis with antibiotics 2. Start Flonase 2 sprays in each nare daily for 4-6 weeks likely need longer or repeat 3. Start home OTC Zyrtec 10mg  daily now prior to Spring allergy season

## 2016-05-02 ENCOUNTER — Other Ambulatory Visit: Payer: Self-pay | Admitting: Psychiatry

## 2016-05-03 ENCOUNTER — Ambulatory Visit (INDEPENDENT_AMBULATORY_CARE_PROVIDER_SITE_OTHER): Payer: No Typology Code available for payment source | Admitting: Psychiatry

## 2016-05-03 ENCOUNTER — Encounter: Payer: Self-pay | Admitting: Psychiatry

## 2016-05-03 VITALS — BP 140/85 | HR 105 | Temp 98.4°F | Wt 167.6 lb

## 2016-05-03 DIAGNOSIS — F603 Borderline personality disorder: Secondary | ICD-10-CM | POA: Diagnosis not present

## 2016-05-03 DIAGNOSIS — F3160 Bipolar disorder, current episode mixed, unspecified: Secondary | ICD-10-CM

## 2016-05-03 MED ORDER — LITHIUM CARBONATE 150 MG PO CAPS: 150.0000 mg | ORAL_CAPSULE | Freq: Two times a day (BID) | ORAL | 1 refills | Status: DC

## 2016-05-03 MED ORDER — QUETIAPINE FUMARATE 25 MG PO TABS: ORAL_TABLET | ORAL | 1 refills | Status: DC

## 2016-05-03 MED ORDER — PROPRANOLOL HCL 10 MG PO TABS: 10.0000 mg | ORAL_TABLET | Freq: Two times a day (BID) | ORAL | 1 refills | Status: DC

## 2016-05-03 NOTE — Progress Notes (Signed)
Psychiatric MD Progress Note   Patient Identification: Sheryl Perez MRN:  [id number] Date of Evaluation:  05/03/2016 Referral Source: Transfer from [facility] Complaint:   Chief Complaint    Follow-up; Medication Refill     Visit Diagnosis:    ICD-9-CM ICD-10-CM   1. Bipolar I disorder, most recent episode mixed ([place]) 296.60 F31.60   2. Borderline personality disorder 301.83 F60.3     History of Present Illness:    Patient is a 56 year old female with history of bipolar disorder who presented for follow-up . She reported that she doing well but has been in the depressive phase at this time. She was last seen in November. She reported that she started feeling depressed for the past month. She reported that she has been compliant with her medication and has been taking lithium 150 mg. She is interested in going higher on the dose of the lithium at this time. Patient stated that she has been taking propranolol as well. She currently denied having any suicidal homicidal ideations or plans. She reported that she does not take Seroquel on a regular basis as she has to take care of her 68-year-old granddaughter and cannot sleep well. She denied having any suicidal homicidal ideations or plans. She denied having any perceptual disturbances.      Associated Signs/Symptoms: Depression Symptoms:  anxiety, (Hypo) Manic Symptoms:  Labiality of Mood, Anxiety Symptoms:  none Psychotic Symptoms:  none PTSD Symptoms: Negative NA  Past Psychiatric History:  Patient reported that she has started following with a psychiatrist in [place] who diagnosed her with bipolar disorder in 2007. She was seen in that practice for approximately 2 years but says she owe them money she left the practice. She reported that she started following her primary care physician at [facility] dates and neck and her medical doctor was prescribing her medications. He left them in 2009 and was admitted to [facility] as her  lithium level was high. She reported that she was admitted to [facility] twice. One time it was due to manic episode and the other time due to lithium toxicity She reported that she started following at [facility] psychiatric practice. Her husband was incarcerated in 2014 and she stopped following her psychiatrist as she wanted to be manic to control her personal issues. She ran out of her medications and was not taking any medication for almost 1 year. She reported that he came out of the prison in 2015. Patient reported that she does not want to discuss about the reason he was incarcerated. However she was hospitalized 2 [facility] inpatient behavioral health unit in December 2016 due to manic behavior She was started on Seroquel and lorazepam and she responded well to the medications. She  was discharged to follow up at [facility] health but she does not like the practice  Patient denied any history of suicide attempts  Previous Psychotropic Medications:  Risperdal -akathsia Abilify- h/o catatonia Lamictal Lithium - took for 6 years, but her level went higher, no side effects. [facility]mitted [facility].  Prozac Trazodone-     Substance Abuse History in the last 12 months:  Yes.    Alcohol - occasionally MJ- Daily- Smokes 2-3 puffs  Smokes 1/2 pack per day  Consequences of Substance Abuse: Negative NA  Past Medical History:  Past Medical History:  Diagnosis Date  . Back injury   . 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 Bipolar disorder ([place])   . Chest pain, unspecified   .  Chronic low back pain 10/23/2013  . Chronic UTI   . Depression   . Dysfunction of eustachian tube   . Enlargement of lymph nodes   . History of Bell's palsy Right  . Injury, other and unspecified, knee, leg, ankle, and foot   . MRSA (methicillin resistant staph aureus) culture positive 07-11-14   abscess  . Other specified disease of hair and hair follicles   . Rectal prolapse   . Unspecified symptom  associated with female genital organs     Past Surgical History:  Procedure Laterality Date  . ABDOMINAL HYSTERECTOMY    . bladder tack  2010  . CHOLECYSTECTOMY  2002  . FOOT SURGERY Bilateral 2004   bunion  . GANGLION CYST EXCISION  2002  . INCISION AND DRAINAGE ABSCESS  04-16-15  . PARTIAL HYSTERECTOMY  2010   uterus removed    Family Psychiatric History:   Daughter- PTSD, she takes medications for the same.   Family History:  Family History  Problem Relation Age of Onset  . Parkinson's disease Maternal Grandfather   . Diabetes Father   . Cancer Father     brain tumor  . Depression Father   . Diabetes Paternal Grandfather   . Colon polyps Mother   . Heart disease Maternal Grandmother   . Heart attack Maternal Grandmother   . Stroke Maternal Grandmother   . Breast cancer Paternal Aunt   . Breast cancer Cousin   . Depression Sister   . Alcohol abuse Brother   . Depression Brother     Social History:   Social History   Social History  . Marital status: Married    Spouse name: N/A  . Number of children: 3  . Years of education: hs   Occupational History  . [employer]   Social History Main Topics  . Smoking status: Current Every Day Smoker    Packs/day: 1.00    Years: 40.00    Types: Cigarettes  . Smokeless tobacco: Never Used  . Alcohol use No     Comment: occasional  . Drug use: Yes    Types: Marijuana     Comment: last used 05-20-15  . Sexual activity: Yes    Birth control/ protection: None   Other Topics Concern  . None   Social History Narrative   Patient lives at home with her husband [name]).   Patient works full time.   Education CNA    Right handed.   Caffeine Tea , Soda.    Additional Social History:   Married x 24 years. Lives with husband. Has 3 children and 4 grandchildren. 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   Allergies:   Allergies  Allergen Reactions  . Other  Other (See Comments)    PT states that anything with the sides effects "may cause rash" she cannot take due to her psoriasis, it will make the patches on her hand so thick she can't use her hands.   . Sulfa Antibiotics Swelling    Diarrhea,dizziness, tongue swelling  . Abilify [Aripiprazole] Other (See Comments)  . Cephalosporins Rash  . Erythromycin Other (See Comments)    Abd. pain  . Lamictal [Lamotrigine] Rash    psorasis worsen  . Risperdal [Risperidone]     tremors    Metabolic Disorder Labs: Lab Results  Component Value Date   HGBA1C 5.8 (H) 05/30/2015   No results found for: PROLACTIN Lab Results  Component  Value Date   CHOL 169 05/30/2015   TRIG 135 05/30/2015   HDL 44 05/30/2015   CHOLHDL 3.8 05/30/2015   VLDL 36 01/15/2015   LDLCALC 98 05/30/2015   LDLCALC 70 01/15/2015     Current Medications: Current Outpatient Prescriptions  Medication Sig Dispense Refill  . lithium carbonate 150 MG capsule Take 1 capsule (150 mg total) by mouth at bedtime. 30 capsule 1  . propranolol (INDERAL) 10 MG tablet Take 1 tablet (10 mg total) by mouth 2 (two) times daily. 60 tablet 1  . QUEtiapine (SEROQUEL) 25 MG tablet 1-2 pills po qhs 60 tablet 1   No current facility-administered medications for this visit.     Neurologic: Headache: No Seizure: No Paresthesias:No  Musculoskeletal: Strength & Muscle Tone: within normal limits Gait & Station: normal Patient leans: N/A  Psychiatric Specialty Exam: ROS  Blood pressure 140/85, pulse (!) 105, temperature 98.4 F (36.9 C), temperature source Oral, weight 167 lb 9.6 oz (76 kg), last menstrual period 02/09/2008.Body mass index is 32.73 kg/m.  General Appearance: Casual and Fairly Groomed  Eye Contact:  Fair  Speech:  Clear and Coherent  Volume:  Normal  Mood:  Anxious  Affect:  Congruent  Thought Process:  Goal Directed  Orientation:  Full (Time, Place, and Person)  Thought Content:  WDL  Suicidal Thoughts:  No   Homicidal Thoughts:  No  Memory:  Immediate;   Fair  Judgement:  Fair  Insight:  Fair  Psychomotor Activity:  Normal  Concentration:  Fair  Recall:  [employer] of Knowledge:Fair  Language: Fair  Akathisia:  No  Handed:  Right  AIMS (if indicated):    Assets:  Communication Skills Desire for Improvement Physical Health Social Support  ADL's:  Intact  Cognition: WNL  Sleep:      Treatment Plan Summary: Medication management   Discussed with patient about the medications treatment risks benefits and alternatives I will titrate the dose of lithium 150 mg by mouth twice a day Continue propranolol 10 mg by mouth twice a day to help with her anxiety .  I will start her on Seroquel 25-50   mg by mouth daily at bedtime when necessary for mood  symptoms.     She will follow-up in 2  month or earlier depending on her symptoms.     More than 50% of the time spent in psychoeducation, counseling and coordination of care.    This note was generated in part or whole with voice recognition software. Voice regonition is usually quite accurate but there are transcription errors that can and very often do occur. I apologize for any typographical errors that were not detected and corrected.    Rainey Pines, MD 3/26/20189:09 AM

## 2016-06-07 ENCOUNTER — Ambulatory Visit (INDEPENDENT_AMBULATORY_CARE_PROVIDER_SITE_OTHER): Payer: No Typology Code available for payment source | Admitting: Psychiatry

## 2016-06-07 ENCOUNTER — Encounter: Payer: Self-pay | Admitting: Psychiatry

## 2016-06-07 VITALS — BP 118/72 | HR 77 | Temp 97.4°F | Wt 169.6 lb

## 2016-06-07 DIAGNOSIS — F3160 Bipolar disorder, current episode mixed, unspecified: Secondary | ICD-10-CM | POA: Diagnosis not present

## 2016-06-07 DIAGNOSIS — F603 Borderline personality disorder: Secondary | ICD-10-CM | POA: Diagnosis not present

## 2016-06-07 MED ORDER — QUETIAPINE FUMARATE 25 MG PO TABS: 25.0000 mg | ORAL_TABLET | Freq: Every day | ORAL | 1 refills | Status: DC

## 2016-06-07 MED ORDER — PROPRANOLOL HCL 20 MG PO TABS: 20.0000 mg | ORAL_TABLET | Freq: Two times a day (BID) | ORAL | 1 refills | Status: DC

## 2016-06-07 MED ORDER — LITHIUM CARBONATE 150 MG PO CAPS: ORAL_CAPSULE | ORAL | 1 refills | Status: DC

## 2016-06-07 MED ORDER — HYDROXYZINE PAMOATE 25 MG PO CAPS: 25.0000 mg | ORAL_CAPSULE | Freq: Two times a day (BID) | ORAL | 1 refills | Status: DC | PRN

## 2016-06-07 NOTE — Progress Notes (Signed)
Psychiatric MD Progress Note   Patient Identification: Sheryl Perez MRN:  [id number] Date of Evaluation:  06/07/2016 Referral Source: Transfer from [employer] Complaint:   Chief Complaint    Follow-up; Medication Refill     Visit Diagnosis:    ICD-9-CM ICD-10-CM   1. Bipolar I disorder, most recent episode mixed ([place]) 296.60 F31.60   2. Borderline personality disorder 301.83 F60.3     History of Present Illness:    Patient is a 56 year old female with history of bipolar disorder who presented for follow-up . She reported that she Was having severe anxiety and she was not going to her work on a consistent basis. She was showing me her calendar and stated that she to 2 days off in her first week in has missed several days of her work. She reported that finally she has been stable. She reported that she has GI symptoms when she is not able to go to work. She is applying for FMLA and was asking me if she can get her paperwork done in this office. I advised her to ask her PCP to help her with the same.   Patient currently denied having any suicidal ideations or plans. She reported that the lithium is not helping her and she is interested in going higher on the dose. She was also asking for the anxiety medication. We discussed about starting on the Vistaril and she agreed with the plan. She currently denied having any suicidal homicidal ideations or plans.       Associated Signs/Symptoms: Depression Symptoms:  anxiety, (Hypo) Manic Symptoms:  Labiality of Mood, Anxiety Symptoms:  none Psychotic Symptoms:  none PTSD Symptoms: Negative NA  Past Psychiatric History:  Patient reported that she has started following with a psychiatrist in [place] who diagnosed her with bipolar disorder in 2007. She was seen in that practice for approximately 2 years but says she owe them money she left the practice. She reported that she started following her primary care physician at [facility]  dates and neck and her medical doctor was prescribing her medications. He left them in 2009 and was admitted to [facility] as her lithium level was high. She reported that she was admitted to [facility] twice. One time it was due to manic episode and the other time due to lithium toxicity She reported that she started following at [facility] psychiatric practice. Her husband was incarcerated in 2014 and she stopped following her psychiatrist as she wanted to be manic to control her personal issues. She ran out of her medications and was not taking any medication for almost 1 year. She reported that he came out of the prison in 2015. Patient reported that she does not want to discuss about the reason he was incarcerated. However she was hospitalized 2 [facility] inpatient behavioral health unit in December 2016 due to manic behavior She was started on Seroquel and lorazepam and she responded well to the medications. She  was discharged to follow up at [facility] health but she does not like the practice  Patient denied any history of suicide attempts  Previous Psychotropic Medications:  Risperdal -akathsia Abilify- h/o catatonia Lamictal Lithium - took for 6 years, but her level went higher, no side effects. [facility]  Prozac Trazodone-     Substance Abuse History in the last 12 months:  Yes.    Alcohol - occasionally MJ- Daily- Smokes 2-3 puffs  Smokes 1/2 pack per day  Consequences of Substance Abuse: Negative NA  Past Medical History:  Past Medical History:  Diagnosis Date  . Back injury   . 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 Bipolar disorder ([place])   . Chest pain, unspecified   . Chronic low back pain 10/23/2013  . Chronic UTI   . Depression   . Dysfunction of eustachian tube   . Enlargement of lymph nodes   . History of Bell's palsy Right  . Injury, other and unspecified, knee, leg, ankle, and foot   . MRSA (methicillin resistant staph aureus) culture positive  07-11-14   abscess  . Other specified disease of hair and hair follicles   . Rectal prolapse   . Unspecified symptom associated with female genital organs     Past Surgical History:  Procedure Laterality Date  . ABDOMINAL HYSTERECTOMY    . bladder tack  2010  . CHOLECYSTECTOMY  2002  . FOOT SURGERY Bilateral 2004   bunion  . GANGLION CYST EXCISION  2002  . INCISION AND DRAINAGE ABSCESS  04-16-15  . PARTIAL HYSTERECTOMY  2010   uterus removed    Family Psychiatric History:   Daughter- PTSD, she takes medications for the same.   Family History:  Family History  Problem Relation Age of Onset  . Parkinson's disease Maternal Grandfather   . Diabetes Father   . Cancer Father     brain tumor  . Depression Father   . Diabetes Paternal Grandfather   . Colon polyps Mother   . Heart disease Maternal Grandmother   . Heart attack Maternal Grandmother   . Stroke Maternal Grandmother   . Depression Sister   . Alcohol abuse Brother   . Depression Brother   . Breast cancer Paternal Aunt   . Breast cancer Cousin     Social History:   Social History   Social History  . Marital status: Married    Spouse name: N/A  . Number of children: 3  . Years of education: hs   Occupational History  . [employer]   Social History Main Topics  . Smoking status: Current Every Day Smoker    Packs/day: 1.00    Years: 40.00    Types: Cigarettes  . Smokeless tobacco: Never Used  . Alcohol use No     Comment: occasional  . Drug use: Yes    Types: Marijuana     Comment: last used 05-20-15  . Sexual activity: Yes    Birth control/ protection: None   Other Topics Concern  . None   Social History Narrative   Patient lives at home with her husband [name]).   Patient works full time.   Education CNA    Right handed.   Caffeine Tea , Soda.    Additional Social History:   Married x 24 years. Lives with husband. Has 3 children and 4  grandchildren. 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   Allergies:   Allergies  Allergen Reactions  . Other Other (See Comments)    PT states that anything with the sides effects "may cause rash" she cannot take due to her psoriasis, it will make the patches on her hand so thick she can't use her hands.   . Sulfa Antibiotics Swelling    Diarrhea,dizziness, tongue swelling  . Abilify [Aripiprazole] Other (See Comments)  . Cephalosporins Rash  . Erythromycin Other (See Comments)    Abd. pain  . Lamictal [Lamotrigine] Rash  psorasis worsen  . Risperdal [Risperidone]     tremors    Metabolic Disorder Labs: Lab Results  Component Value Date   HGBA1C 5.8 (H) 05/30/2015   No results found for: PROLACTIN Lab Results  Component Value Date   CHOL 169 05/30/2015   TRIG 135 05/30/2015   HDL 44 05/30/2015   CHOLHDL 3.8 05/30/2015   VLDL 36 01/15/2015   LDLCALC 98 05/30/2015   LDLCALC 70 01/15/2015     Current Medications: Current Outpatient Prescriptions  Medication Sig Dispense Refill  . lithium carbonate 150 MG capsule Take 1 capsule (150 mg total) by mouth 2 (two) times daily with a meal. 60 capsule 1  . propranolol (INDERAL) 10 MG tablet Take 1 tablet (10 mg total) by mouth 2 (two) times daily. 60 tablet 1  . QUEtiapine (SEROQUEL) 25 MG tablet 1-2 pills po qhs 60 tablet 1   No current facility-administered medications for this visit.     Neurologic: Headache: No Seizure: No Paresthesias:No  Musculoskeletal: Strength & Muscle Tone: within normal limits Gait & Station: normal Patient leans: N/A  Psychiatric Specialty Exam: ROS  Blood pressure 118/72, pulse 77, temperature 97.4 F (36.3 C), temperature source Oral, weight 169 lb 9.6 oz (76.9 kg), last menstrual period 02/09/2008.Body mass index is 33.12 kg/m.  General Appearance: Casual and Fairly Groomed  Eye Contact:  Fair  Speech:  Clear and Coherent  Volume:  Normal  Mood:  Anxious  Affect:   Congruent  Thought Process:  Goal Directed  Orientation:  Full (Time, Place, and Person)  Thought Content:  WDL  Suicidal Thoughts:  No  Homicidal Thoughts:  No  Memory:  Immediate;   Fair  Judgement:  Fair  Insight:  Fair  Psychomotor Activity:  Normal  Concentration:  Fair  Recall:  [employer] of Knowledge:Fair  Language: Fair  Akathisia:  No  Handed:  Right  AIMS (if indicated):    Assets:  Communication Skills Desire for Improvement Physical Health Social Support  ADL's:  Intact  Cognition: WNL  Sleep:      Treatment Plan Summary: Medication management   Discussed with patient about the medications treatment risks benefits and alternatives I will titrate the dose of lithium 150 mg- 1 pill in the morning and 2 pills at bedtime. Continue propranolol 10 mg by mouth twice a day to help with her anxiety . I will dispense 20 mg pills and she will take half pill twice daily but she can titrate the dose to 4 pills daily as needed. I will start her on Seroquel 25  mg by mouth daily at bedtime when necessary for mood  symptoms.     She will follow-up in 1  month or earlier depending on her symptoms.     More than 50% of the time spent in psychoeducation, counseling and coordination of care.    This note was generated in part or whole with voice recognition software. Voice regonition is usually quite accurate but there are transcription errors that can and very often do occur. I apologize for any typographical errors that were not detected and corrected.    Rainey Pines, MD 4/30/201811:41 AM

## 2016-06-10 NOTE — Progress Notes (Signed)
Pharmacy was called they did received rx and it has been filled. . rx received.

## 2016-06-14 ENCOUNTER — Ambulatory Visit (INDEPENDENT_AMBULATORY_CARE_PROVIDER_SITE_OTHER): Payer: No Typology Code available for payment source | Admitting: Psychiatry

## 2016-06-14 ENCOUNTER — Encounter: Payer: Self-pay | Admitting: Psychiatry

## 2016-06-14 VITALS — BP 118/71 | HR 89 | Wt 175.0 lb

## 2016-06-14 DIAGNOSIS — F603 Borderline personality disorder: Secondary | ICD-10-CM | POA: Diagnosis not present

## 2016-06-14 DIAGNOSIS — F3160 Bipolar disorder, current episode mixed, unspecified: Secondary | ICD-10-CM | POA: Diagnosis not present

## 2016-06-14 NOTE — Progress Notes (Signed)
Psychiatric MD Progress Note   Patient Identification: Sheryl Perez MRN:  [id number] Date of Evaluation:  06/14/2016 Referral Source: Transfer from [employer] Complaint:   Chief Complaint    Medication Reaction; Medication Problem     Visit Diagnosis:    ICD-9-CM ICD-10-CM   1. Borderline personality disorder 301.83 F60.3   2. Bipolar I disorder, most recent episode mixed ([place]) 296.60 F31.60     History of Present Illness:    Patient is a 56 year old female with history of bipolar disorder who presented As a walk-in. She reported that she is having side effects of the medication and her tongue is swollen. She reported that she call her primary care physician who advised her to start taking Benadryl as she thought that she might be having relation to the hydroxyzine. She stopped taking the hydroxyzine and is still taking the Seroquel. I reviewed her tongue which was slightly red and beefy. She already has history of allergic reaction to Abilify and Risperdal in the past. Advised patient to stop taking the Seroquel as she is currently experiencing relation to the Seroquel. She reported that she has problems with sleep.  Advised patient to start taking the Benadryl and she agreed with the plan. She reported that she did not know that she was having a reaction to the Seroquel. She denied having any suicidal ideations or plans.       Associated Signs/Symptoms: Depression Symptoms:  anxiety, (Hypo) Manic Symptoms:  Labiality of Mood, Anxiety Symptoms:  none Psychotic Symptoms:  none PTSD Symptoms: Negative NA  Past Psychiatric History:  Patient reported that she has started following with a psychiatrist in [place] who diagnosed her with bipolar disorder in 2007. She was seen in that practice for approximately 2 years but says she owe them money she left the practice. She reported that she started following her primary care physician at [facility] dates and neck and her  medical doctor was prescribing her medications. He left them in 2009 and was admitted to [facility] as her lithium level was high. She reported that she was admitted to [facility] twice. One time it was due to manic episode and the other time due to lithium toxicity She reported that she started following at [facility] psychiatric practice. Her husband was incarcerated in 2014 and she stopped following her psychiatrist as she wanted to be manic to control her personal issues. She ran out of her medications and was not taking any medication for almost 1 year. She reported that he came out of the prison in 2015. Patient reported that she does not want to discuss about the reason he was incarcerated. However she was hospitalized 2 [facility] inpatient behavioral health unit in December 2016 due to manic behavior She was started on Seroquel and lorazepam and she responded well to the medications. She  was discharged to follow up at [facility] health but she does not like the practice  Patient denied any history of suicide attempts  Previous Psychotropic Medications:  Risperdal -akathsia Abilify- h/o catatonia Lamictal Lithium - took for 6 years, but her level went higher, no side effects. [facility]  Prozac Trazodone-     Substance Abuse History in the last 12 months:  Yes.    Alcohol - occasionally MJ- Daily- Smokes 2-3 puffs  Smokes 1/2 pack per day  Consequences of Substance Abuse: Negative NA  Past Medical History:  Past Medical History:  Diagnosis Date  . Back injury   . 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 Bipolar disorder ([place])   . Chest pain, unspecified   . Chronic low back pain 10/23/2013  . Chronic UTI   . Depression   . Dysfunction of eustachian tube   . Enlargement of lymph nodes   . History of Bell's palsy Right  . Injury, other and unspecified, knee, leg, ankle, and foot   . MRSA (methicillin resistant staph aureus) culture positive 07-11-14   abscess  .  Other specified disease of hair and hair follicles   . Rectal prolapse   . Unspecified symptom associated with female genital organs     Past Surgical History:  Procedure Laterality Date  . ABDOMINAL HYSTERECTOMY    . bladder tack  2010  . CHOLECYSTECTOMY  2002  . FOOT SURGERY Bilateral 2004   bunion  . GANGLION CYST EXCISION  2002  . INCISION AND DRAINAGE ABSCESS  04-16-15  . PARTIAL HYSTERECTOMY  2010   uterus removed    Family Psychiatric History:   Daughter- PTSD, she takes medications for the same.   Family History:  Family History  Problem Relation Age of Onset  . Parkinson's disease Maternal Grandfather   . Diabetes Father   . Cancer Father     brain tumor  . Depression Father   . Diabetes Paternal Grandfather   . Colon polyps Mother   . Heart disease Maternal Grandmother   . Heart attack Maternal Grandmother   . Stroke Maternal Grandmother   . Depression Sister   . Alcohol abuse Brother   . Depression Brother   . Breast cancer Paternal Aunt   . Breast cancer Cousin     Social History:   Social History   Social History  . Marital status: Married    Spouse name: N/A  . Number of children: 3  . Years of education: hs   Occupational History  . [employer]   Social History Main Topics  . Smoking status: Current Every Day Smoker    Packs/day: 1.00    Years: 40.00    Types: Cigarettes  . Smokeless tobacco: Never Used  . Alcohol use No     Comment: occasional  . Drug use: Yes    Types: Marijuana     Comment: last used 05-20-15  . Sexual activity: Yes    Birth control/ protection: None   Other Topics Concern  . None   Social History Narrative   Patient lives at home with her husband [name]).   Patient works full time.   Education CNA    Right handed.   Caffeine Tea , Soda.    Additional Social History:   Married x 24 years. Lives with husband. Has 3 children and 4 grandchildren. 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   Allergies:   Allergies  Allergen Reactions  . Other Other (See Comments)    PT states that anything with the sides effects "may cause rash" she cannot take due to her psoriasis, it will make the patches on her hand so thick she can't use her hands.   . Sulfa Antibiotics Swelling    Diarrhea,dizziness, tongue swelling  . Abilify [Aripiprazole] Other (See Comments)  . Cephalosporins Rash  . Erythromycin Other (See Comments)    Abd. pain  . Lamictal [Lamotrigine] Rash    psorasis worsen  . Risperdal [Risperidone]     tremors    Metabolic Disorder Labs: Lab Results  Component  Value Date   HGBA1C 5.8 (H) 05/30/2015   No results found for: PROLACTIN Lab Results  Component Value Date   CHOL 169 05/30/2015   TRIG 135 05/30/2015   HDL 44 05/30/2015   CHOLHDL 3.8 05/30/2015   VLDL 36 01/15/2015   LDLCALC 98 05/30/2015   LDLCALC 70 01/15/2015     Current Medications: Current Outpatient Prescriptions  Medication Sig Dispense Refill  . lithium carbonate 150 MG capsule 1 pill in am and 2 pills at bed time 90 capsule 1  . propranolol (INDERAL) 20 MG tablet Take 1 tablet (20 mg total) by mouth 2 (two) times daily. 60 tablet 1  . QUEtiapine (SEROQUEL) 25 MG tablet Take 1 tablet (25 mg total) by mouth at bedtime. 30 tablet 1  . hydrOXYzine (VISTARIL) 25 MG capsule Take 1 capsule (25 mg total) by mouth 2 (two) times daily as needed. (Patient not taking: Reported on 06/14/2016) 60 capsule 1   No current facility-administered medications for this visit.     Neurologic: Headache: No Seizure: No Paresthesias:No  Musculoskeletal: Strength & Muscle Tone: within normal limits Gait & Station: normal Patient leans: N/A  Psychiatric Specialty Exam: ROS  Blood pressure 118/71, pulse 89, weight 175 lb (79.4 kg), last menstrual period 02/09/2008.Body mass index is 34.18 kg/m.  General Appearance: Casual and Fairly Groomed  Eye Contact:  Fair   Speech:  Clear and Coherent  Volume:  Normal  Mood:  Anxious  Affect:  Congruent  Thought Process:  Goal Directed  Orientation:  Full (Time, Place, and Person)  Thought Content:  WDL  Suicidal Thoughts:  No  Homicidal Thoughts:  No  Memory:  Immediate;   Fair  Judgement:  Fair  Insight:  Fair  Psychomotor Activity:  Normal  Concentration:  Fair  Recall:  [employer] of Knowledge:Fair  Language: Fair  Akathisia:  No  Handed:  Right  AIMS (if indicated):    Assets:  Communication Skills Desire for Improvement Physical Health Social Support  ADL's:  Intact  Cognition: WNL  Sleep:      Treatment Plan Summary: Medication management   Discussed with patient about the medications treatment risks benefits and alternatives I will titrate the dose of lithium 150 mg- 1 pill in the morning and 2 pills at bedtime. Continue propranolol 10 mg by mouth twice a day to help with her anxiety . I will dispense 20 mg pills and she will take half pill twice daily but she can titrate the dose to 4 pills daily as needed. Discontinue Seroquel and added her to the allergy list. I will give her Benadryl 50 mg at bedtime Advised patient to start taking melatonin when necessary for insomnia Patient will also get her labs including her lithium level and she agreed with the plan  She will follow-up in  2 weeks  or earlier depending on her symptoms.     More than 50% of the time spent in psychoeducation, counseling and coordination of care.    This note was generated in part or whole with voice recognition software. Voice regonition is usually quite accurate but there are transcription errors that can and very often do occur. I apologize for any typographical errors that were not detected and corrected.    Rainey Pines, MD 5/7/20183:13 PM

## 2016-06-15 ENCOUNTER — Other Ambulatory Visit: Payer: Self-pay | Admitting: Psychiatry

## 2016-06-16 LAB — BASIC METABOLIC PANEL
BUN/Creatinine Ratio: 6 — ABNORMAL LOW (ref 9–23)
BUN: 7 mg/dL (ref 6–24)
CO2: 23 mmol/L (ref 18–29)
Calcium: 9.3 mg/dL (ref 8.7–10.2)
Chloride: 106 mmol/L (ref 96–106)
Creatinine, Ser: 1.21 mg/dL — ABNORMAL HIGH (ref 0.57–1.00)
GFR calc Af Amer: 58 mL/min/{1.73_m2} — ABNORMAL LOW (ref >59)
GFR calc non Af Amer: 50 mL/min/{1.73_m2} — ABNORMAL LOW (ref >59)
Glucose: 157 mg/dL — ABNORMAL HIGH (ref 65–99)
Potassium: 3.9 mmol/L (ref 3.5–5.2)
Sodium: 146 mmol/L — ABNORMAL HIGH (ref 134–144)

## 2016-06-16 LAB — CBC
Hematocrit: 36.1 % (ref 34.0–46.6)
Hemoglobin: 12.2 g/dL (ref 11.1–15.9)
MCH: 31.4 pg (ref 26.6–33.0)
MCHC: 33.8 g/dL (ref 31.5–35.7)
MCV: 93 fL (ref 79–97)
Platelets: 343 10*3/uL (ref 150–379)
RBC: 3.88 x10E6/uL (ref 3.77–5.28)
RDW: 13.6 % (ref 12.3–15.4)
WBC: 7.6 10*3/uL (ref 3.4–10.8)

## 2016-06-16 LAB — TSH: TSH: 2.94 u[IU]/mL (ref 0.450–4.500)

## 2016-06-16 LAB — LITHIUM LEVEL: Lithium Lvl: 0.3 mmol/L — ABNORMAL LOW (ref 0.6–1.2)

## 2016-06-17 ENCOUNTER — Telehealth: Payer: Self-pay

## 2016-06-17 NOTE — Telephone Encounter (Signed)
pt wants lab results. pt had labwork done yesterday

## 2016-06-28 ENCOUNTER — Encounter: Payer: Self-pay | Admitting: Psychiatry

## 2016-06-28 ENCOUNTER — Ambulatory Visit (INDEPENDENT_AMBULATORY_CARE_PROVIDER_SITE_OTHER): Payer: No Typology Code available for payment source | Admitting: Psychiatry

## 2016-06-28 VITALS — BP 112/71 | HR 79 | Temp 97.9°F | Wt 168.6 lb

## 2016-06-28 DIAGNOSIS — F3160 Bipolar disorder, current episode mixed, unspecified: Secondary | ICD-10-CM

## 2016-06-28 DIAGNOSIS — F603 Borderline personality disorder: Secondary | ICD-10-CM | POA: Diagnosis not present

## 2016-06-28 MED ORDER — LITHIUM CARBONATE 150 MG PO CAPS: ORAL_CAPSULE | ORAL | 1 refills | Status: DC

## 2016-06-28 MED ORDER — HYDROXYZINE PAMOATE 25 MG PO CAPS: 25.0000 mg | ORAL_CAPSULE | Freq: Two times a day (BID) | ORAL | 1 refills | Status: DC | PRN

## 2016-06-28 MED ORDER — PROPRANOLOL HCL 20 MG PO TABS: 20.0000 mg | ORAL_TABLET | Freq: Two times a day (BID) | ORAL | 1 refills | Status: DC

## 2016-06-28 NOTE — Progress Notes (Signed)
Psychiatric MD Progress Note   Patient Identification: Sheryl Perez MRN:  [id number] Date of Evaluation:  06/28/2016 Referral Source: Transfer from [employer] Complaint:   Chief Complaint    Follow-up; Medication Refill     Visit Diagnosis:    ICD-9-CM ICD-10-CM   1. Bipolar I disorder, most recent episode mixed (Sheryl Perez) 296.60 F31.60   2. Borderline personality disorder 301.83 F60.3     History of Present Illness:    Patient is a 56 year old female with history of bipolar disorder who presented For follow-up. She reported that she is currently feeling better and the current combination of medications helping her. She reported that she is not having any side effects the medication. She appeared calm and alert during the interview. She reported that she has been sleeping well. She is happy with the current combinations and does not want to change any medications. She is planning to go to the beach this weekend with her family. She was talking at length about her family issues. She remains compliant with her medications. She  denied having any suicidal homicidal ideations or plans.         Associated Signs/Symptoms: Depression Symptoms:  anxiety, (Hypo) Manic Symptoms:  Labiality of Mood, Anxiety Symptoms:  none Psychotic Symptoms:  none PTSD Symptoms: Negative NA  Past Psychiatric History:  Patient reported that she has started following with a psychiatrist in [place] who diagnosed her with bipolar disorder in 2007. She was seen in that practice for approximately 2 years but says she owe them money she left the practice. She reported that she started following her primary care physician at [facility] dates and neck and her medical doctor was prescribing her medications. He left them in 2009 and was admitted to [facility] as her lithium level was high. She reported that she was admitted to [facility] twice. One time it was due to manic episode and the other time due to lithium  toxicity She reported that she started following at [facility] psychiatric practice. Her husband was incarcerated in 2014 and she stopped following her psychiatrist as she wanted to be manic to control her personal issues. She ran out of her medications and was not taking any medication for almost 1 year. She reported that he came out of the prison in 2015. Patient reported that she does not want to discuss about the reason he was incarcerated. However she was hospitalized 2 [facility] inpatient behavioral health unit in December 2016 due to manic behavior She was started on Seroquel and lorazepam and she responded well to the medications. She  was discharged to follow up at [facility] health but she does not like the practice  Patient denied any history of suicide attempts  Previous Psychotropic Medications:  Risperdal -akathsia Abilify- h/o catatonia Lamictal Lithium - took for 6 years, but her level went higher, no side effects. [facility]  Prozac Trazodone-     Substance Abuse History in the last 12 months:  Yes.    Alcohol - occasionally MJ- Daily- Smokes 2-3 puffs  Smokes 1/2 pack per day  Consequences of Substance Abuse: Negative NA  Past Medical History:  Past Medical History:  Diagnosis Date  . Back injury   . 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 Bipolar disorder ([place])   . Chest pain, unspecified   . Chronic low back pain 10/23/2013  . Chronic UTI   . Depression   . Dysfunction of eustachian tube   . Enlargement of  lymph nodes   . History of Bell's palsy Right  . Injury, other and unspecified, knee, leg, ankle, and foot   . MRSA (methicillin resistant staph aureus) culture positive 07-11-14   abscess  . Other specified disease of hair and hair follicles   . Rectal prolapse   . Unspecified symptom associated with female genital organs     Past Surgical History:  Procedure Laterality Date  . ABDOMINAL HYSTERECTOMY    . bladder tack  2010  .  CHOLECYSTECTOMY  2002  . FOOT SURGERY Bilateral 2004   bunion  . GANGLION CYST EXCISION  2002  . INCISION AND DRAINAGE ABSCESS  04-16-15  . PARTIAL HYSTERECTOMY  2010   uterus removed    Family Psychiatric History:   Daughter- PTSD, she takes medications for the same.   Family History:  Family History  Problem Relation Age of Onset  . Parkinson's disease Maternal Grandfather   . Diabetes Father   . Cancer Father        brain tumor  . Depression Father   . Diabetes Paternal Grandfather   . Colon polyps Mother   . Heart disease Maternal Grandmother   . Heart attack Maternal Grandmother   . Stroke Maternal Grandmother   . Depression Sister   . Alcohol abuse Brother   . Depression Brother   . Breast cancer Paternal Aunt   . Breast cancer Cousin     Social History:   Social History   Social History  . Marital status: Married    Spouse name: N/A  . Number of children: 3  . Years of education: hs   Occupational History  . [employer]   Social History Main Topics  . Smoking status: Current Every Day Smoker    Packs/day: 1.00    Years: 40.00    Types: Cigarettes  . Smokeless tobacco: Never Used  . Alcohol use No     Comment: occasional  . Drug use: Yes    Types: Marijuana     Comment: last used 05-20-15  . Sexual activity: Yes    Birth control/ protection: None   Other Topics Concern  . None   Social History Narrative   Patient lives at home with her husband Sheryl Perez).   Patient works full time.   Education CNA    Right handed.   Caffeine Tea , Soda.    Additional Social History:   Married x 24 years. Lives with husband. Has 3 children and 4 grandchildren. 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   Allergies:   Allergies  Allergen Reactions  . Other Other (See Comments)    PT states that anything with the sides effects "may cause rash" she cannot take due to her psoriasis, it will make the patches  on her hand so thick she can't use her hands.   . Seroquel [Quetiapine]     Tongue swelling  . Sulfa Antibiotics Swelling    Diarrhea,dizziness, tongue swelling  . Abilify [Aripiprazole] Other (See Comments)  . Cephalosporins Rash  . Erythromycin Other (See Comments)    Abd. pain  . Lamictal [Lamotrigine] Rash    psorasis worsen  . Risperdal [Risperidone]     tremors    Metabolic Disorder Labs: Lab Results  Component Value Date   HGBA1C 5.8 (H) 05/30/2015   No results found for: PROLACTIN Lab Results  Component Value Date   CHOL 169 05/30/2015   TRIG 135 05/30/2015  HDL 44 05/30/2015   CHOLHDL 3.8 05/30/2015   VLDL 36 01/15/2015   LDLCALC 98 05/30/2015   LDLCALC 70 01/15/2015     Current Medications: Current Outpatient Prescriptions  Medication Sig Dispense Refill  . hydrOXYzine (VISTARIL) 25 MG capsule Take 1 capsule (25 mg total) by mouth 2 (two) times daily as needed. 60 capsule 1  . lithium carbonate 150 MG capsule 1 pill in am and 2 pills at bed time 90 capsule 1  . propranolol (INDERAL) 20 MG tablet Take 1 tablet (20 mg total) by mouth 2 (two) times daily. 60 tablet 1   No current facility-administered medications for this visit.     Neurologic: Headache: No Seizure: No Paresthesias:No  Musculoskeletal: Strength & Muscle Tone: within normal limits Gait & Station: normal Patient leans: N/A  Psychiatric Specialty Exam: ROS  Blood pressure 112/71, pulse 79, temperature 97.9 F (36.6 C), temperature source Oral, weight 168 lb 9.6 oz (76.5 kg), last menstrual period 02/09/2008.Body mass index is 32.93 kg/m.  General Appearance: Casual and Fairly Groomed  Eye Contact:  Fair  Speech:  Clear and Coherent  Volume:  Normal  Mood:  Anxious  Affect:  Congruent  Thought Process:  Goal Directed  Orientation:  Full (Time, Place, and Person)  Thought Content:  WDL  Suicidal Thoughts:  No  Homicidal Thoughts:  No  Memory:  Immediate;   Fair  Judgement:   Fair  Insight:  Fair  Psychomotor Activity:  Normal  Concentration:  Fair  Recall:  [employer] of Knowledge:Fair  Language: Fair  Akathisia:  No  Handed:  Right  AIMS (if indicated):    Assets:  Communication Skills Desire for Improvement Physical Health Social Support  ADL's:  Intact  Cognition: WNL  Sleep:      Treatment Plan Summary: Medication management   Discussed with patient about the medications treatment risks benefits and alternatives Continue lithium 150 mg- 1 pill in the morning and 2 pills at bedtime. Continue propranolol 10 mg by mouth twice a day to help with her anxiety . I will dispense 20 mg pills and she will take half pill twice daily but she can titrate the dose to 4 pills daily as needed. Continue hydroxyzine 25 mg by mouth twice a day Advised patient to start taking melatonin when necessary for insomnia She will follow-up in  2 months  or earlier depending on her symptoms.     More than 50% of the time spent in psychoeducation, counseling and coordination of care.    This note was generated in part or whole with voice recognition software. Voice regonition is usually quite accurate but there are transcription errors that can and very often do occur. I apologize for any typographical errors that were not detected and corrected.    Rainey Pines, MD 5/21/20189:29 AM

## 2016-07-12 ENCOUNTER — Encounter: Payer: Self-pay | Admitting: Psychiatry

## 2016-08-04 ENCOUNTER — Ambulatory Visit (INDEPENDENT_AMBULATORY_CARE_PROVIDER_SITE_OTHER): Payer: No Typology Code available for payment source | Admitting: Psychiatry

## 2016-08-04 ENCOUNTER — Encounter: Payer: Self-pay | Admitting: Psychiatry

## 2016-08-04 VITALS — BP 123/72 | HR 84 | Temp 97.4°F | Wt 165.4 lb

## 2016-08-04 DIAGNOSIS — F3160 Bipolar disorder, current episode mixed, unspecified: Secondary | ICD-10-CM

## 2016-08-04 DIAGNOSIS — F603 Borderline personality disorder: Secondary | ICD-10-CM

## 2016-08-04 MED ORDER — LITHIUM CARBONATE 150 MG PO CAPS: ORAL_CAPSULE | ORAL | 1 refills | Status: DC

## 2016-08-04 MED ORDER — HYDROXYZINE PAMOATE 25 MG PO CAPS: 25.0000 mg | ORAL_CAPSULE | Freq: Two times a day (BID) | ORAL | 1 refills | Status: DC | PRN

## 2016-08-04 MED ORDER — PROPRANOLOL HCL 20 MG PO TABS: 20.0000 mg | ORAL_TABLET | Freq: Two times a day (BID) | ORAL | 1 refills | Status: DC

## 2016-08-04 NOTE — Progress Notes (Signed)
Psychiatric MD Progress Note   Patient Identification: Sheryl Perez MRN:  [id number] Date of Evaluation:  08/04/2016 Referral Source: Transfer from [employer] Complaint:   Chief Complaint    Follow-up; Medication Refill     Visit Diagnosis:    ICD-10-CM   1. Borderline personality disorder F60.3   2. Bipolar I disorder, most recent episode mixed ([place]) F31.60     History of Present Illness:    Patient is a 56 year old female with history of bipolar disorder who presented for follow-up. She reported that she is applying for FMLA and brought her paperwork. She reported that she was not going to her work regularly during the month of May when she was feeling sick and her primary care physician helped with her paperwork but it was not approved. She has appointment on Friday and she will discuss with them. She reported that she is trying to look for another job. Patient reported that she has been compliant with her medications and they are helpful. She reported that she might also changed her insurance.  Patient appeared calm and alert during the interview. She currently denied having any suicidal homicidal ideations or plans. We discussed about her medications in detail. She reported that the medications are helpful and she is sleeping well at night.     Associated Signs/Symptoms: Depression Symptoms:  anxiety, (Hypo) Manic Symptoms:  Labiality of Mood, Anxiety Symptoms:  none Psychotic Symptoms:  none PTSD Symptoms: Negative NA  Past Psychiatric History:  Patient reported that she has started following with a psychiatrist in [place] who diagnosed her with bipolar disorder in 2007. She was seen in that practice for approximately 2 years but says she owe them money she left the practice. She reported that she started following her primary care physician and her  medical doctor was prescribing her medications. He left them in 2009 and was admitted to [facility] as her lithium level  was high. She reported that she was admitted to [facility] twice. One time it was due to manic episode and the other time due to lithium toxicity She reported that she started following at [facility] psychiatric practice. Her husband was incarcerated in 2014 and she stopped following her psychiatrist as she wanted to be manic to control her personal issues. She ran out of her medications and was not taking any medication for almost 1 year. She reported that he came out of the prison in 2015. Patient reported that she does not want to discuss about the reason he was incarcerated. However she was hospitalized 2 [facility] inpatient behavioral health unit in December 2016 due to manic behavior She was started on Seroquel and lorazepam and she responded well to the medications. She  was discharged to follow up at [facility] health but she does not like the practice  Patient denied any history of suicide attempts  Previous Psychotropic Medications:  Risperdal -akathsia Abilify- h/o catatonia Lamictal Lithium - took for 6 years, but her level went higher, no side effects. [facility]  Prozac Trazodone-     Substance Abuse History in the last 12 months:  Yes.    Alcohol - occasionally MJ- Daily- Smokes 2-3 puffs  Smokes 1/2 pack per day  Consequences of Substance Abuse: Negative NA  Past Medical History:  Past Medical History:  Diagnosis Date  . Back injury   . 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 Bipolar disorder ([place])   . Chest pain, unspecified   . Chronic  low back pain 10/23/2013  . Chronic UTI   . Depression   . Dysfunction of eustachian tube   . Enlargement of lymph nodes   . History of Bell's palsy Right  . Injury, other and unspecified, knee, leg, ankle, and foot   . MRSA (methicillin resistant staph aureus) culture positive 07-11-14   abscess  . Other specified disease of hair and hair follicles   . Rectal prolapse   . Unspecified symptom associated with  female genital organs     Past Surgical History:  Procedure Laterality Date  . ABDOMINAL HYSTERECTOMY    . bladder tack  2010  . CHOLECYSTECTOMY  2002  . FOOT SURGERY Bilateral 2004   bunion  . GANGLION CYST EXCISION  2002  . INCISION AND DRAINAGE ABSCESS  04-16-15  . PARTIAL HYSTERECTOMY  2010   uterus removed    Family Psychiatric History:   Daughter- PTSD, she takes medications for the same.   Family History:  Family History  Problem Relation Age of Onset  . Parkinson's disease Maternal Grandfather   . Diabetes Father   . Cancer Father        brain tumor  . Depression Father   . Diabetes Paternal Grandfather   . Colon polyps Mother   . Heart disease Maternal Grandmother   . Heart attack Maternal Grandmother   . Stroke Maternal Grandmother   . Depression Sister   . Alcohol abuse Brother   . Depression Brother   . Breast cancer Paternal Aunt   . Breast cancer Cousin     Social History:   Social History   Social History  . Marital status: Married    Spouse name: N/A  . Number of children: 3  . Years of education: hs   Occupational History  . [employer]   Social History Main Topics  . Smoking status: Current Every Day Smoker    Packs/day: 1.00    Years: 40.00    Types: Cigarettes  . Smokeless tobacco: Never Used  . Alcohol use No     Comment: occasional  . Drug use: Yes    Types: Marijuana     Comment: last used 05-20-15  . Sexual activity: Yes    Birth control/ protection: None   Other Topics Concern  . None   Social History Narrative   Patient lives at home with her husband [name]).   Patient works full time.   Education CNA    Right handed.   Caffeine Tea , Soda.    Additional Social History:   Married x 24 years. Lives with husband. Has 3 children and 4 grandchildren. 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   Allergies:   Allergies  Allergen Reactions  . Other Other (See  Comments)    PT states that anything with the sides effects "may cause rash" she cannot take due to her psoriasis, it will make the patches on her hand so thick she can't use her hands.   . Seroquel [Quetiapine]     Tongue swelling  . Sulfa Antibiotics Swelling    Diarrhea,dizziness, tongue swelling  . Abilify [Aripiprazole] Other (See Comments)  . Cephalosporins Rash  . Erythromycin Other (See Comments)    Abd. pain  . Lamictal [Lamotrigine] Rash    psorasis worsen  . Risperdal [Risperidone]     tremors    Metabolic Disorder Labs: Lab Results  Component Value Date   HGBA1C 5.8 (H)  05/30/2015   No results found for: PROLACTIN Lab Results  Component Value Date   CHOL 169 05/30/2015   TRIG 135 05/30/2015   HDL 44 05/30/2015   CHOLHDL 3.8 05/30/2015   VLDL 36 01/15/2015   LDLCALC 98 05/30/2015   LDLCALC 70 01/15/2015     Current Medications: Current Outpatient Prescriptions  Medication Sig Dispense Refill  . hydrOXYzine (VISTARIL) 25 MG capsule Take 1 capsule (25 mg total) by mouth 2 (two) times daily as needed. 180 capsule 1  . lithium carbonate 150 MG capsule 1 pill in am and 2 pills at bed time 270 capsule 1  . propranolol (INDERAL) 20 MG tablet Take 1 tablet (20 mg total) by mouth 2 (two) times daily. 180 tablet 1   No current facility-administered medications for this visit.     Neurologic: Headache: No Seizure: No Paresthesias:No  Musculoskeletal: Strength & Muscle Tone: within normal limits Gait & Station: normal Patient leans: N/A  Psychiatric Specialty Exam: ROS  Blood pressure 123/72, pulse 84, temperature 97.4 F (36.3 C), temperature source Oral, weight 165 lb 6.4 oz (75 kg), last menstrual period 02/09/2008.Body mass index is 32.3 kg/m.  General Appearance: Casual and Fairly Groomed  Eye Contact:  Fair  Speech:  Clear and Coherent  Volume:  Normal  Mood:  Anxious  Affect:  Congruent  Thought Process:  Goal Directed  Orientation:  Full (Time,  Place, and Person)  Thought Content:  WDL  Suicidal Thoughts:  No  Homicidal Thoughts:  No  Memory:  Immediate;   Fair  Judgement:  Fair  Insight:  Fair  Psychomotor Activity:  Normal  Concentration:  Fair  Recall:  [employer] of Knowledge:Fair  Language: Fair  Akathisia:  No  Handed:  Right  AIMS (if indicated):    Assets:  Communication Skills Desire for Improvement Physical Health Social Support  ADL's:  Intact  Cognition: WNL  Sleep:      Treatment Plan Summary: Medication management   Discussed with patient about the medications treatment risks benefits and alternatives Continue lithium 150 mg- 1 pill in the morning and 2 pills at bedtime. Continue propranolol 10 mg by mouth twice a day to help with her anxiety . Continue hydroxyzine 25 mg by mouth twice a day Patient will be given 3 month supply of the medications as she is trying to change her insurance at this time.  Advised patient I will refill her paperwork for FMLA before her next appointment and she agreed with the plan.  She will follow-up in one month or earlier depending on her symptoms.     More than 50% of the time spent in psychoeducation, counseling and coordination of care.    This note was generated in part or whole with voice recognition software. Voice regonition is usually quite accurate but there are transcription errors that can and very often do occur. I apologize for any typographical errors that were not detected and corrected.    Rainey Pines, MD 6/27/201812:19 PM

## 2016-08-06 ENCOUNTER — Other Ambulatory Visit: Payer: Self-pay | Admitting: Psychiatry

## 2016-08-23 ENCOUNTER — Ambulatory Visit (INDEPENDENT_AMBULATORY_CARE_PROVIDER_SITE_OTHER): Payer: No Typology Code available for payment source | Admitting: Psychiatry

## 2016-08-23 DIAGNOSIS — F603 Borderline personality disorder: Secondary | ICD-10-CM

## 2016-08-23 DIAGNOSIS — F3160 Bipolar disorder, current episode mixed, unspecified: Secondary | ICD-10-CM | POA: Diagnosis not present

## 2016-08-23 NOTE — Progress Notes (Signed)
Psychiatric MD Progress Note   Patient Identification: Sheryl Perez MRN:  [id number] Date of Evaluation:  08/23/2016 Referral Source: Transfer from [employer] Complaint:    Visit Diagnosis:    ICD-10-CM   1. Bipolar I disorder, most recent episode mixed ([place]) F31.60   2. Borderline personality disorder F60.3     History of Present Illness:    Patient is a 56 year old female with history of bipolar disorder who presented for follow-up. She reported that she is Doing well and has been taking her medications. She reported that has good relationship with her husband and today is her 36th wedding anniversary. She appeared happy and calm during the interview. We discussed about her FMLA paperwork which she has left at her last appointment. She reported that her primary care physician Dr. Arlyn Dunning tried to fill it out but it did not work out she is  applying for [employer] on intermittent basis. She reported that she had the panic attack last night and she took her medications and went to bed. She currently denied having any suicidal homicidal ideations or plans. She appeared calm and alert during the interview.    She is compliant with her medications and has been stable at this time. She reported that her pharmacy did not give her the 90 day supply of the medications due to her insurance.  Patient is currently sleeping well at night. No aggressive behavior noted.    Associated Signs/Symptoms: Depression Symptoms:  anxiety, (Hypo) Manic Symptoms:  Labiality of Mood, Anxiety Symptoms:  none Psychotic Symptoms:  none PTSD Symptoms: Negative NA  Past Psychiatric History:  Patient reported that she has started following with a psychiatrist in [place] who diagnosed her with bipolar disorder in 2007. She was seen in that practice for approximately 2 years but says she owe them money she left the practice. She reported that she started following her primary care physician and her  medical  doctor was prescribing her medications. He left them in 2009 and was admitted to [facility] as her lithium level was high. She reported that she was admitted to [facility] twice. One time it was due to manic episode and the other time due to lithium toxicity She reported that she started following at [facility] psychiatric practice. Her husband was incarcerated in 2014 and she stopped following her psychiatrist as she wanted to be manic to control her personal issues. She ran out of her medications and was not taking any medication for almost 1 year. She reported that he came out of the prison in 2015. Patient reported that she does not want to discuss about the reason he was incarcerated. However she was hospitalized 2 [facility] inpatient behavioral health unit in December 2016 due to manic behavior She was started on Seroquel and lorazepam and she responded well to the medications. She  was discharged to follow up at [facility] but she does not like the practice  Patient denied any history of suicide attempts  Previous Psychotropic Medications:  Risperdal -akathsia Abilify- h/o catatonia Lamictal Lithium - took for 6 years, but her level went higher, no side effects. [facility]  Prozac Trazodone-     Substance Abuse History in the last 12 months:  Yes.    Alcohol - occasionally MJ- Daily- Smokes 2-3 puffs  Smokes 1/2 pack per day  Consequences of Substance Abuse: Negative NA  Past Medical History:  Past Medical History:  Diagnosis Date  . Back injury   . Benign neoplasm of  ear and external auditory canal   . Bipolar disorder ([place])   . Chest pain, unspecified   . Chronic low back pain 10/23/2013  . Chronic UTI   . Depression   . Dysfunction of eustachian tube   . Enlargement of lymph nodes   . History of Bell's palsy Right  . Injury, other and unspecified, knee, leg, ankle, and foot   . MRSA (methicillin resistant staph aureus) culture positive 07-11-14   abscess  . Other  specified disease of hair and hair follicles   . Rectal prolapse   . Unspecified symptom associated with female genital organs     Past Surgical History:  Procedure Laterality Date  . ABDOMINAL HYSTERECTOMY    . bladder tack  2010  . CHOLECYSTECTOMY  2002  . FOOT SURGERY Bilateral 2004   bunion  . GANGLION CYST EXCISION  2002  . INCISION AND DRAINAGE ABSCESS  04-16-15  . PARTIAL HYSTERECTOMY  2010   uterus removed    Family Psychiatric History:   Daughter- PTSD, she takes medications for the same.   Family History:  Family History  Problem Relation Age of Onset  . Parkinson's disease Maternal Grandfather   . Diabetes Father   . Cancer Father        brain tumor  . Depression Father   . Diabetes Paternal Grandfather   . Colon polyps Mother   . Heart disease Maternal Grandmother   . Heart attack Maternal Grandmother   . Stroke Maternal Grandmother   . Depression Sister   . Alcohol abuse Brother   . Depression Brother   . Breast cancer Paternal Aunt   . Breast cancer Cousin     Social History:   Social History   Social History  . Marital status: Married    Spouse name: N/A  . Number of children: 3  . Years of education: hs   Occupational History  . [employer]   Social History Main Topics  . Smoking status: Current Every Day Smoker    Packs/day: 1.00    Years: 40.00    Types: Cigarettes  . Smokeless tobacco: Never Used  . Alcohol use No     Comment: occasional  . Drug use: Yes    Types: Marijuana     Comment: last used 05-20-15  . Sexual activity: Yes    Birth control/ protection: None   Other Topics Concern  . Not on file   Social History Narrative   Patient lives at home with her husband [name]).   Patient works full time.   Education CNA    Right handed.   Caffeine Tea , Soda.    Additional Social History:   Married x 24 years. Lives with husband. Has 3 children and 4 grandchildren. 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   Allergies:   Allergies  Allergen Reactions  . Other Other (See Comments)    PT states that anything with the sides effects "may cause rash" she cannot take due to her psoriasis, it will make the patches on her hand so thick she can't use her hands.   . Seroquel [Quetiapine]     Tongue swelling  . Sulfa Antibiotics Swelling    Diarrhea,dizziness, tongue swelling  . Abilify [Aripiprazole] Other (See Comments)  . Cephalosporins Rash  . Erythromycin Other (See Comments)    Abd. pain  . Lamictal [Lamotrigine] Rash    psorasis worsen  . Risperdal [  Risperidone]     tremors    Metabolic Disorder Labs: Lab Results  Component Value Date   HGBA1C 5.8 (H) 05/30/2015   No results found for: PROLACTIN Lab Results  Component Value Date   CHOL 169 05/30/2015   TRIG 135 05/30/2015   HDL 44 05/30/2015   CHOLHDL 3.8 05/30/2015   VLDL 36 01/15/2015   LDLCALC 98 05/30/2015   LDLCALC 70 01/15/2015     Current Medications: Current Outpatient Prescriptions  Medication Sig Dispense Refill  . hydrOXYzine (VISTARIL) 25 MG capsule Take 1 capsule (25 mg total) by mouth 2 (two) times daily as needed. 180 capsule 1  . lithium carbonate 150 MG capsule 1 pill in am and 2 pills at bed time 270 capsule 1  . propranolol (INDERAL) 20 MG tablet Take 1 tablet (20 mg total) by mouth 2 (two) times daily. 180 tablet 1   No current facility-administered medications for this visit.     Neurologic: Headache: No Seizure: No Paresthesias:No  Musculoskeletal: Strength & Muscle Tone: within normal limits Gait & Station: normal Patient leans: N/A  Psychiatric Specialty Exam: ROS  Last menstrual period 02/09/2008.There is no height or weight on file to calculate BMI.  General Appearance: Casual and Fairly Groomed  Eye Contact:  Fair  Speech:  Clear and Coherent  Volume:  Normal  Mood:  Anxious  Affect:  Congruent  Thought Process:  Goal Directed   Orientation:  Full (Time, Place, and Person)  Thought Content:  WDL  Suicidal Thoughts:  No  Homicidal Thoughts:  No  Memory:  Immediate;   Fair  Judgement:  Fair  Insight:  Fair  Psychomotor Activity:  Normal  Concentration:  Fair  Recall:  [employer] of Knowledge:Fair  Language: Fair  Akathisia:  No  Handed:  Right  AIMS (if indicated):    Assets:  Communication Skills Desire for Improvement Physical Health Social Support  ADL's:  Intact  Cognition: WNL  Sleep:      Treatment Plan Summary: Medication management   Discussed with patient about the medications treatment risks benefits and alternatives Continue lithium 150 mg- 1 pill in the morning and 2 pills at bedtime. Continue propranolol 10 mg by mouth twice a day to help with her anxiety . Continue hydroxyzine 25 mg by mouth twice a day Patient will be given 3 month supply of the medications as she is trying to change her insurance at this time.    She will follow-up in 2 month or earlier depending on her symptoms.     More than 50% of the time spent in psychoeducation, counseling and coordination of care.    This note was generated in part or whole with voice recognition software. Voice regonition is usually quite accurate but there are transcription errors that can and very often do occur. I apologize for any typographical errors that were not detected and corrected.    Rainey Pines, MD 7/16/201810:01 AM

## 2016-10-12 ENCOUNTER — Ambulatory Visit (INDEPENDENT_AMBULATORY_CARE_PROVIDER_SITE_OTHER): Payer: No Typology Code available for payment source | Admitting: Psychiatry

## 2016-10-12 ENCOUNTER — Encounter: Payer: Self-pay | Admitting: Psychiatry

## 2016-10-12 VITALS — BP 138/79 | HR 81 | Temp 98.0°F | Wt 178.4 lb

## 2016-10-12 DIAGNOSIS — F3160 Bipolar disorder, current episode mixed, unspecified: Secondary | ICD-10-CM

## 2016-10-12 DIAGNOSIS — F603 Borderline personality disorder: Secondary | ICD-10-CM

## 2016-10-12 MED ORDER — LITHIUM CARBONATE 150 MG PO CAPS: ORAL_CAPSULE | ORAL | 1 refills | Status: DC

## 2016-10-12 MED ORDER — PROPRANOLOL HCL 20 MG PO TABS: 20.0000 mg | ORAL_TABLET | Freq: Two times a day (BID) | ORAL | 1 refills | Status: DC

## 2016-10-12 MED ORDER — HYDROXYZINE PAMOATE 25 MG PO CAPS: 25.0000 mg | ORAL_CAPSULE | Freq: Two times a day (BID) | ORAL | 1 refills | Status: DC | PRN

## 2016-10-12 NOTE — Progress Notes (Signed)
Psychiatric MD Progress Note   Patient Identification: Sheryl Perez MRN:  [id number] Date of Evaluation:  10/12/2016 Referral Source: Transfer from [employer] Complaint:   Chief Complaint    Follow-up; Medication Refill     Visit Diagnosis:    ICD-10-CM   1. Bipolar I disorder, most recent episode mixed ([place]) F31.60   2. Borderline personality disorder F60.3     History of Present Illness:    Patient is a 56 year old female with history of bipolar disorder who presented for follow-up. She reported that she Has been having pain in her hip joint and was walking with a limp. Patient reported that she has been compliant with her medications. She stated that she has noticed improvement in her anxiety since she was started on the propranolol. She has been compliant with her medications. We discussed about her medications in detail. She is taking lithium propranolol and hydroxyzine on a regular basis. She appeared calm and alert during the interview. She stated that she has been sleeping well and does not have any acute issues. Patient currently denied having any suicidal ideations or plans. She has good relationship with her husband at this time. Her mood and anger symptoms are improving.    She appeared calm and alert during the interview.       Associated Signs/Symptoms: Depression Symptoms:  anxiety, (Hypo) Manic Symptoms:  Labiality of Mood, Anxiety Symptoms:  none Psychotic Symptoms:  none PTSD Symptoms: Negative NA  Past Psychiatric History:  Patient reported that she has started following with a psychiatrist in [place] who diagnosed her with bipolar disorder in 2007. She was seen in that practice for approximately 2 years but says she owe them money she left the practice. She reported that she started following her primary care physician and her  medical doctor was prescribing her medications. He left them in 2009 and was admitted to [facility] as her lithium level was high.  She reported that she was admitted to [facility] twice. One time it was due to manic episode and the other time due to lithium toxicity She reported that she started following at [facility] psychiatric practice. Her husband was incarcerated in 2014 and she stopped following her psychiatrist as she wanted to be manic to control her personal issues. She ran out of her medications and was not taking any medication for almost 1 year. She reported that he came out of the prison in 2015. Patient reported that she does not want to discuss about the reason he was incarcerated. However she was hospitalized 2 [facility] inpatient behavioral health unit in December 2016 due to manic behavior She was started on Seroquel and lorazepam and she responded well to the medications. She  was discharged to follow up at [facility] but she does not like the practice  Patient denied any history of suicide attempts  Previous Psychotropic Medications:  Risperdal -akathsia Abilify- h/o catatonia Lamictal Lithium - took for 6 years, but her level went higher, no side effects. [facility]  Prozac Trazodone-     Substance Abuse History in the last 12 months:  Yes.    Alcohol - occasionally MJ- Daily- Smokes 2-3 puffs  Smokes 1/2 pack per day  Consequences of Substance Abuse: Negative NA  Past Medical History:  Past Medical History:  Diagnosis Date  . Back injury   . 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 Bipolar disorder ([place])   . Chest pain, unspecified   . Chronic  low back pain 10/23/2013  . Chronic UTI   . Depression   . Dysfunction of eustachian tube   . Enlargement of lymph nodes   . History of Bell's palsy Right  . Injury, other and unspecified, knee, leg, ankle, and foot   . MRSA (methicillin resistant staph aureus) culture positive 07-11-14   abscess  . Other specified disease of hair and hair follicles   . Rectal prolapse   . Unspecified symptom associated with female genital  organs     Past Surgical History:  Procedure Laterality Date  . ABDOMINAL HYSTERECTOMY    . bladder tack  2010  . CHOLECYSTECTOMY  2002  . FOOT SURGERY Bilateral 2004   bunion  . GANGLION CYST EXCISION  2002  . INCISION AND DRAINAGE ABSCESS  04-16-15  . PARTIAL HYSTERECTOMY  2010   uterus removed    Family Psychiatric History:   Daughter- PTSD, she takes medications for the same.   Family History:  Family History  Problem Relation Age of Onset  . Parkinson's disease Maternal Grandfather   . Diabetes Father   . Cancer Father        brain tumor  . Depression Father   . Diabetes Paternal Grandfather   . Colon polyps Mother   . Heart disease Maternal Grandmother   . Heart attack Maternal Grandmother   . Stroke Maternal Grandmother   . Depression Sister   . Alcohol abuse Brother   . Depression Brother   . Breast cancer Paternal Aunt   . Breast cancer Cousin     Social History:   Social History   Social History  . Marital status: Married    Spouse name: N/A  . Number of children: 3  . Years of education: hs   Occupational History  . [employer]   Social History Main Topics  . Smoking status: Current Every Day Smoker    Packs/day: 1.00    Years: 40.00    Types: Cigarettes  . Smokeless tobacco: Never Used  . Alcohol use No     Comment: occasional  . Drug use: Yes    Types: Marijuana     Comment: last used 05-20-15  . Sexual activity: Yes    Birth control/ protection: None   Other Topics Concern  . None   Social History Narrative   Patient lives at home with her husband [name]).   Patient works full time.   Education CNA    Right handed.   Caffeine Tea , Soda.    Additional Social History:   Married x 24 years. Lives with husband. Has 3 children and 4 grandchildren. 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   Allergies:   Allergies  Allergen Reactions  . Other Other (See Comments)    PT  states that anything with the sides effects "may cause rash" she cannot take due to her psoriasis, it will make the patches on her hand so thick she can't use her hands.   . Seroquel [Quetiapine]     Tongue swelling  . Sulfa Antibiotics Swelling    Diarrhea,dizziness, tongue swelling  . Abilify [Aripiprazole] Other (See Comments)  . Cephalosporins Rash  . Erythromycin Other (See Comments)    Abd. pain  . Lamictal [Lamotrigine] Rash    psorasis worsen  . Risperdal [Risperidone]     tremors    Metabolic Disorder Labs: Lab Results  Component Value Date   HGBA1C 5.8 (H)  05/30/2015   No results found for: PROLACTIN Lab Results  Component Value Date   CHOL 169 05/30/2015   TRIG 135 05/30/2015   HDL 44 05/30/2015   CHOLHDL 3.8 05/30/2015   VLDL 36 01/15/2015   LDLCALC 98 05/30/2015   LDLCALC 70 01/15/2015     Current Medications: Current Outpatient Prescriptions  Medication Sig Dispense Refill  . hydrOXYzine (VISTARIL) 25 MG capsule Take 1 capsule (25 mg total) by mouth 2 (two) times daily as needed. 60 capsule 1  . lithium carbonate 150 MG capsule 1 pill in am and 2 pills at bed time 90 capsule 1  . propranolol (INDERAL) 20 MG tablet Take 1 tablet (20 mg total) by mouth 2 (two) times daily. 60 tablet 1   No current facility-administered medications for this visit.     Neurologic: Headache: No Seizure: No Paresthesias:No  Musculoskeletal: Strength & Muscle Tone: within normal limits Gait & Station: normal Patient leans: N/A  Psychiatric Specialty Exam: ROS  Blood pressure 138/79, pulse 81, temperature 98 F (36.7 C), temperature source Oral, weight 178 lb 6.4 oz (80.9 kg), last menstrual period 02/09/2008.Body mass index is 34.84 kg/m.  General Appearance: Casual and Fairly Groomed  Eye Contact:  Fair  Speech:  Clear and Coherent  Volume:  Normal  Mood:  Anxious  Affect:  Congruent  Thought Process:  Goal Directed  Orientation:  Full (Time, Place, and Person)   Thought Content:  WDL  Suicidal Thoughts:  No  Homicidal Thoughts:  No  Memory:  Immediate;   Fair  Judgement:  Fair  Insight:  Fair  Psychomotor Activity:  Normal  Concentration:  Fair  Recall:  [employer] of Knowledge:Fair  Language: Fair  Akathisia:  No  Handed:  Right  AIMS (if indicated):    Assets:  Communication Skills Desire for Improvement Physical Health Social Support  ADL's:  Intact  Cognition: WNL  Sleep:      Treatment Plan Summary: Medication management   Discussed with patient about the medications treatment risks benefits and alternatives Continue lithium 150 mg- 1 pill in the morning and 2 pills at bedtime. Continue propranolol 20 mg by mouth twice a day to help with her anxiety . Continue hydroxyzine 25 mg by mouth twice a day Advised patient to take medications every 4 hours and she agreed with the plan.    She will follow-up in 2 month or earlier depending on her symptoms.     More than 50% of the time spent in psychoeducation, counseling and coordination of care.    This note was generated in part or whole with voice recognition software. Voice regonition is usually quite accurate but there are transcription errors that can and very often do occur. I apologize for any typographical errors that were not detected and corrected.    Rainey Pines, MD 9/4/201810:18 AM

## 2016-10-18 ENCOUNTER — Ambulatory Visit: Payer: No Typology Code available for payment source | Admitting: Psychiatry

## 2016-11-01 ENCOUNTER — Telehealth: Payer: Self-pay | Admitting: Psychiatry

## 2016-11-08 ENCOUNTER — Ambulatory Visit: Payer: No Typology Code available for payment source | Admitting: Psychiatry

## 2016-11-11 NOTE — Telephone Encounter (Signed)
PLEASE SIGN OFF ON THIS MESSAGE SO I KNOW YOU HAVE SEEN IT. THANK YOU.

## 2016-12-06 ENCOUNTER — Encounter: Payer: Self-pay | Admitting: Psychiatry

## 2016-12-06 ENCOUNTER — Ambulatory Visit (INDEPENDENT_AMBULATORY_CARE_PROVIDER_SITE_OTHER): Payer: No Typology Code available for payment source | Admitting: Psychiatry

## 2016-12-06 VITALS — BP 126/79 | HR 103 | Temp 97.8°F | Wt 171.8 lb

## 2016-12-06 DIAGNOSIS — F3160 Bipolar disorder, current episode mixed, unspecified: Secondary | ICD-10-CM

## 2016-12-06 DIAGNOSIS — F603 Borderline personality disorder: Secondary | ICD-10-CM | POA: Diagnosis not present

## 2016-12-06 MED ORDER — LITHIUM CARBONATE 150 MG PO CAPS: ORAL_CAPSULE | ORAL | 1 refills | Status: DC

## 2016-12-06 MED ORDER — PROPRANOLOL HCL 20 MG PO TABS: 20.0000 mg | ORAL_TABLET | Freq: Two times a day (BID) | ORAL | 4 refills | Status: DC

## 2016-12-06 MED ORDER — PROPRANOLOL HCL 20 MG PO TABS: 20.0000 mg | ORAL_TABLET | Freq: Two times a day (BID) | ORAL | 1 refills | Status: DC

## 2016-12-06 MED ORDER — HYDROXYZINE PAMOATE 25 MG PO CAPS: 25.0000 mg | ORAL_CAPSULE | Freq: Two times a day (BID) | ORAL | 4 refills | Status: DC | PRN

## 2016-12-06 MED ORDER — HYDROXYZINE PAMOATE 25 MG PO CAPS: 25.0000 mg | ORAL_CAPSULE | Freq: Two times a day (BID) | ORAL | 1 refills | Status: DC | PRN

## 2016-12-06 MED ORDER — LITHIUM CARBONATE 150 MG PO CAPS: ORAL_CAPSULE | ORAL | 4 refills | Status: DC

## 2016-12-06 NOTE — Progress Notes (Signed)
Psychiatric MD Progress Note   Patient Identification: Sheryl Perez MRN:  [id number] Date of Evaluation:  12/06/2016 Referral Source: Transfer from [employer] Complaint:   Chief Complaint    Follow-up; Medication Refill     Visit Diagnosis:    ICD-10-CM   1. Bipolar I disorder, most recent episode mixed ([place]) F31.60   2. Borderline personality disorder ([place]) F60.3     History of Present Illness:    Patient is a 56 year old female with history of bipolar disorder who presented for follow-up. She reported that she Lost her job in September as she was taking too much time off and now she is planning to apply for disability. Patient reported that she cannot work any longer. She was asking for the process. She reported that she has been compliant with her medication and the current combinations helping her. She reported that she does not want to change her medications at this time as they are affordable. She is currently living with her son. She denied having any acute symptoms. She appears calm and comfortable during the interview. We discussed about the medication compliance and she agreed with the plan.   Her mood and anger symptoms are improving.    She appeared calm and alert during the interview.       Associated Signs/Symptoms: Depression Symptoms:  anxiety, (Hypo) Manic Symptoms:  Labiality of Mood, Anxiety Symptoms:  none Psychotic Symptoms:  none PTSD Symptoms: Negative NA  Past Psychiatric History:  Patient reported that she has started following with a psychiatrist in [place] who diagnosed her with bipolar disorder in 2007. She was seen in that practice for approximately 2 years but says she owe them money she left the practice. She reported that she started following her primary care physician and her  medical doctor was prescribing her medications. He left them in 2009 and was admitted to [facility] as her lithium level was high. She reported that she was admitted  to [facility] twice. One time it was due to manic episode and the other time due to lithium toxicity She reported that she started following at [facility] psychiatric practice. Her husband was incarcerated in 2014 and she stopped following her psychiatrist as she wanted to be manic to control her personal issues. She ran out of her medications and was not taking any medication for almost 1 year. She reported that he came out of the prison in 2015. Patient reported that she does not want to discuss about the reason he was incarcerated. However she was hospitalized 2 [facility] inpatient behavioral health unit in December 2016 due to manic behavior She was started on Seroquel and lorazepam and she responded well to the medications. She  was discharged to follow up at [facility] health but she does not like the practice  Patient denied any history of suicide attempts  Previous Psychotropic Medications:  Risperdal -akathsia Abilify- h/o catatonia Lamictal Lithium - took for 6 years, but her level went higher, no side effects. [facility]  Prozac Trazodone-     Substance Abuse History in the last 12 months:  Yes.    Alcohol - occasionally MJ- Daily- Smokes 2-3 puffs  Smokes 1/2 pack per day  Consequences of Substance Abuse: Negative NA  Past Medical History:  Past Medical History:  Diagnosis Date  . Back injury   . 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 Bipolar disorder ([place])   . Chest pain, unspecified   . Chronic low back  pain 10/23/2013  . Chronic UTI   . Depression   . Dysfunction of eustachian tube   . Enlargement of lymph nodes   . History of Bell's palsy Right  . Injury, other and unspecified, knee, leg, ankle, and foot   . MRSA (methicillin resistant staph aureus) culture positive 07-11-14   abscess  . Other specified disease of hair and hair follicles   . Rectal prolapse   . Unspecified symptom associated with female genital organs     Past Surgical History:   Procedure Laterality Date  . ABDOMINAL HYSTERECTOMY    . bladder tack  2010  . CHOLECYSTECTOMY  2002  . FOOT SURGERY Bilateral 2004   bunion  . GANGLION CYST EXCISION  2002  . INCISION AND DRAINAGE ABSCESS  04-16-15  . PARTIAL HYSTERECTOMY  2010   uterus removed    Family Psychiatric History:   Daughter- PTSD, she takes medications for the same.   Family History:  Family History  Problem Relation Age of Onset  . Parkinson's disease Maternal Grandfather   . Diabetes Father   . Cancer Father        brain tumor  . Depression Father   . Diabetes Paternal Grandfather   . Colon polyps Mother   . Heart disease Maternal Grandmother   . Heart attack Maternal Grandmother   . Stroke Maternal Grandmother   . Depression Sister   . Alcohol abuse Brother   . Depression Brother   . Breast cancer Paternal Aunt   . Breast cancer Cousin     Social History:   Social History   Social History  . Marital status: Married    Spouse name: N/A  . Number of children: 3  . Years of education: hs   Occupational History  . [employer]   Social History Main Topics  . Smoking status: Current Every Day Smoker    Packs/day: 1.00    Years: 40.00    Types: Cigarettes  . Smokeless tobacco: Never Used  . Alcohol use No     Comment: occasional  . Drug use: Yes    Types: Marijuana     Comment: last used 05-20-15  . Sexual activity: Yes    Birth control/ protection: None   Other Topics Concern  . None   Social History Narrative   Patient lives at home with her husband [name]).   Patient works full time.   Education CNA    Right handed.   Caffeine Tea , Soda.    Additional Social History:   Married x 24 years. Lives with husband. Has 3 children and 4 grandchildren. 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   Allergies:   Allergies  Allergen Reactions  . Other Other (See Comments)    PT states that anything with the sides  effects "may cause rash" she cannot take due to her psoriasis, it will make the patches on her hand so thick she can't use her hands.   . Seroquel [Quetiapine]     Tongue swelling  . Sulfa Antibiotics Swelling    Diarrhea,dizziness, tongue swelling  . Abilify [Aripiprazole] Other (See Comments)  . Cephalosporins Rash  . Erythromycin Other (See Comments)    Abd. pain  . Lamictal [Lamotrigine] Rash    psorasis worsen  . Risperdal [Risperidone]     tremors    Metabolic Disorder Labs: Lab Results  Component Value Date   HGBA1C 5.8 (H) 05/30/2015  No results found for: PROLACTIN Lab Results  Component Value Date   CHOL 169 05/30/2015   TRIG 135 05/30/2015   HDL 44 05/30/2015   CHOLHDL 3.8 05/30/2015   VLDL 36 01/15/2015   LDLCALC 98 05/30/2015   LDLCALC 70 01/15/2015     Current Medications: Current Outpatient Prescriptions  Medication Sig Dispense Refill  . hydrOXYzine (VISTARIL) 25 MG capsule Take 1 capsule (25 mg total) by mouth 2 (two) times daily as needed. 60 capsule 1  . lithium carbonate 150 MG capsule 1 pill in am and 2 pills at bed time 90 capsule 1  . propranolol (INDERAL) 20 MG tablet Take 1 tablet (20 mg total) by mouth 2 (two) times daily. 60 tablet 1   No current facility-administered medications for this visit.     Neurologic: Headache: No Seizure: No Paresthesias:No  Musculoskeletal: Strength & Muscle Tone: within normal limits Gait & Station: normal Patient leans: N/A  Psychiatric Specialty Exam: ROS  Blood pressure 126/79, pulse (!) 103, temperature 97.8 F (36.6 C), temperature source Oral, weight 171 lb 12.8 oz (77.9 kg), last menstrual period 02/09/2008.Body mass index is 33.55 kg/m.  General Appearance: Casual and Fairly Groomed  Eye Contact:  Fair  Speech:  Clear and Coherent  Volume:  Normal  Mood:  Anxious  Affect:  Congruent  Thought Process:  Goal Directed  Orientation:  Full (Time, Place, and Person)  Thought Content:  WDL   Suicidal Thoughts:  No  Homicidal Thoughts:  No  Memory:  Immediate;   Fair  Judgement:  Fair  Insight:  Fair  Psychomotor Activity:  Normal  Concentration:  Fair  Recall:  [employer] of Knowledge:Fair  Language: Fair  Akathisia:  No  Handed:  Right  AIMS (if indicated):    Assets:  Communication Skills Desire for Improvement Physical Health Social Support  ADL's:  Intact  Cognition: WNL  Sleep:      Treatment Plan Summary: Medication management   Discussed with patient about the medications treatment risks benefits and alternatives Continue lithium 150 mg- 1 pill in the morning and 2 pills at bedtime. Continue propranolol 20 mg by mouth twice a day to help with her anxiety . Continue hydroxyzine 25 mg by mouth twice a day Advised patient to take medications every 4 hours and she agreed with the plan.    She will follow-up in 4 month or earlier depending on her symptoms.Medication refilled for the next 4 months.     More than 50% of the time spent in psychoeducation, counseling and coordination of care.    This note was generated in part or whole with voice recognition software. Voice regonition is usually quite accurate but there are transcription errors that can and very often do occur. I apologize for any typographical errors that were not detected and corrected.    Rainey Pines, MD 10/29/20189:56 AM

## 2017-01-04 ENCOUNTER — Telehealth: Payer: Self-pay | Admitting: Psychiatry

## 2017-01-05 NOTE — Telephone Encounter (Signed)
pt called states that she made changes to her medications and that you told her to let you know when she did that. pt states that you said you would call her and discuss.  pt was told that you would not be in the off until monday.   This is a Dr. Gretel Acre patient.   Pt would not give me anymore details. States that dr.faheem just told her to call her and let her know that she is making changes and that she would call her back to discuss.

## 2017-01-07 ENCOUNTER — Telehealth: Payer: Self-pay | Admitting: Psychiatry

## 2017-01-07 NOTE — Telephone Encounter (Signed)
Returned call to patient to discuss medication concern since Probation officer received a message from [place]. No answer . Went to Designer, television/film set.

## 2017-01-07 NOTE — Telephone Encounter (Signed)
Called pt . She is not available.

## 2017-01-10 ENCOUNTER — Encounter: Payer: Self-pay | Admitting: Psychiatry

## 2017-01-10 ENCOUNTER — Ambulatory Visit (INDEPENDENT_AMBULATORY_CARE_PROVIDER_SITE_OTHER): Payer: No Typology Code available for payment source | Admitting: Psychiatry

## 2017-01-10 ENCOUNTER — Other Ambulatory Visit: Payer: Self-pay

## 2017-01-10 VITALS — BP 127/74 | HR 105 | Temp 98.6°F | Ht 60.63 in | Wt 169.2 lb

## 2017-01-10 DIAGNOSIS — F603 Borderline personality disorder: Secondary | ICD-10-CM | POA: Diagnosis not present

## 2017-01-10 DIAGNOSIS — F3160 Bipolar disorder, current episode mixed, unspecified: Secondary | ICD-10-CM | POA: Diagnosis not present

## 2017-01-10 MED ORDER — QUETIAPINE FUMARATE 50 MG PO TABS: 50.0000 mg | ORAL_TABLET | Freq: Every day | ORAL | 0 refills | Status: DC

## 2017-01-10 MED ORDER — LITHIUM CARBONATE 150 MG PO CAPS: ORAL_CAPSULE | ORAL | 4 refills | Status: DC

## 2017-01-10 MED ORDER — PROPRANOLOL HCL 10 MG PO TABS: 10.0000 mg | ORAL_TABLET | Freq: Two times a day (BID) | ORAL | 0 refills | Status: DC

## 2017-01-10 NOTE — Progress Notes (Signed)
Psychiatric MD Progress Note   Patient Identification: Sheryl Perez MRN:  [id number] Date of Evaluation:  01/10/2017 Referral Source: Transfer from [employer] Complaint:   Chief Complaint    Follow-up; Medication Refill; Medication Problem     Visit Diagnosis:    ICD-10-CM   1. Bipolar I disorder, most recent episode mixed ([place]) F31.60   2. Borderline personality disorder ([place]) F60.3     History of Present Illness:    Patient is a 56 year old female with history of bipolar disorder who presented for follow-up. She reported that she she called last week as she was having side effects from the lithium. She reported that nobody was applying to her phone calls. She was upset as she was having side effects and she wanted to talk to somebody. Patient reported that she stopped taking the lithium couple of days ago as she was feeling weird in her mouth. She started taking the medications again. She appeared hypomanic during the interview. We discussed about her other medications. She brought the bottles of propranolol and hydroxyzine. She appeared anxious and apprehensive during the interview. She denied having any suicidal ideations or plans.  Reported that she also slept well with the help of the Seroquel which was from her old prescription and she was not having any side effects although it was listed in her allergies as she was having swelling. However she reported that she did not experience any side effects at this time.  Patient reported that she wants to start the Seroquel again.  She is able to discuss about her symptoms in detail. She denied having any perceptual disturbances. She reported that she is going to complain about the telephone calls and nobody returning her calls at this time.          Associated Signs/Symptoms: Depression Symptoms:  anxiety, (Hypo) Manic Symptoms:  Labiality of Mood, Anxiety Symptoms:  none Psychotic Symptoms:  none PTSD  Symptoms: Negative NA  Past Psychiatric History:  Patient reported that she has started following with a psychiatrist in [place] who diagnosed her with bipolar disorder in 2007. She was seen in that practice for approximately 2 years but says she owe them money she left the practice. She reported that she started following her primary care physician and her  medical doctor was prescribing her medications. He left them in 2009 and was admitted to [facility] as her lithium level was high. She reported that she was admitted to [facility] twice. One time it was due to manic episode and the other time due to lithium toxicity She reported that she started following at [facility] psychiatric practice. Her husband was incarcerated in 2014 and she stopped following her psychiatrist as she wanted to be manic to control her personal issues. She ran out of her medications and was not taking any medication for almost 1 year. She reported that he came out of the prison in 2015. Patient reported that she does not want to discuss about the reason he was incarcerated. However she was hospitalized 2 [facility] inpatient behavioral health unit in December 2016 due to manic behavior She was started on Seroquel and lorazepam and she responded well to the medications. She  was discharged to follow up at [facility] health but she does not like the practice  Patient denied any history of suicide attempts  Previous Psychotropic Medications:  Risperdal -akathsia Abilify- h/o catatonia Lamictal Lithium - took for 6 years, but her level went higher, no side effects. [facility]  Prozac Trazodone-     Substance Abuse History in the last 12 months:  Yes.    Alcohol - occasionally MJ- Daily- Smokes 2-3 puffs  Smokes 1/2 pack per day  Consequences of Substance Abuse: Negative NA  Past Medical History:  Past Medical History:  Diagnosis Date  . Back injury   . 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  Bipolar disorder ([place])   . Chest pain, unspecified   . Chronic low back pain 10/23/2013  . Chronic UTI   . Depression   . Dysfunction of eustachian tube   . Enlargement of lymph nodes   . History of Bell's palsy Right  . Injury, other and unspecified, knee, leg, ankle, and foot   . MRSA (methicillin resistant staph aureus) culture positive 07-11-14   abscess  . Other specified disease of hair and hair follicles   . Rectal prolapse   . Unspecified symptom associated with female genital organs     Past Surgical History:  Procedure Laterality Date  . ABDOMINAL HYSTERECTOMY    . bladder tack  2010  . CHOLECYSTECTOMY  2002  . FOOT SURGERY Bilateral 2004   bunion  . GANGLION CYST EXCISION  2002  . INCISION AND DRAINAGE ABSCESS  04-16-15  . PARTIAL HYSTERECTOMY  2010   uterus removed    Family Psychiatric History:   Daughter- PTSD, she takes medications for the same.   Family History:  Family History  Problem Relation Age of Onset  . Parkinson's disease Maternal Grandfather   . Diabetes Father   . Cancer Father        brain tumor  . Depression Father   . Diabetes Paternal Grandfather   . Colon polyps Mother   . Heart disease Maternal Grandmother   . Heart attack Maternal Grandmother   . Stroke Maternal Grandmother   . Depression Sister   . Alcohol abuse Brother   . Depression Brother   . Breast cancer Paternal Aunt   . Breast cancer Cousin     Social History:   Social History   Socioeconomic History  . Marital status: Married    Spouse name: None  . Number of children: 3  . Years of education: hs  . Highest education level: Some college, no degree  Social Needs  . Financial resource strain: Hard  . Food insecurity - worry: Never true  . Food insecurity - inability: Never true  . Transportation needs - medical: No  . Transportation needs - non-medical: No  Occupational History  . Occupation: Designer, multimedia: [employer]    Comment: [employer]  Tobacco Use  . Smoking status: Current Every Day Smoker    Packs/day: 1.00    Years: 40.00    Pack years: 40.00    Types: Cigarettes  . Smokeless tobacco: Never Used  Substance and Sexual Activity  . Alcohol use: No    Alcohol/week: 0.6 oz    Types: 1 Glasses of wine per week    Comment: occasional  . Drug use: Yes    Types: Marijuana    Comment: last used 05-20-15  . Sexual activity: Yes    Birth control/protection: None  Other Topics Concern  . None  Social History Narrative   Patient lives at home with her husband [name]).   Patient works full time.   Education CNA    Right handed.   Caffeine Tea , Soda.    Additional Social History:  Married x 24 years. Lives with husband. Has 3 children and 4 grandchildren. 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   Allergies:   Allergies  Allergen Reactions  . Other Other (See Comments)    PT states that anything with the sides effects "may cause rash" she cannot take due to her psoriasis, it will make the patches on her hand so thick she can't use her hands.   . Sulfa Antibiotics Swelling    Diarrhea,dizziness, tongue swelling  . Abilify [Aripiprazole] Other (See Comments)  . Cephalosporins Rash  . Erythromycin Other (See Comments)    Abd. pain  . Lamictal [Lamotrigine] Rash    psorasis worsen  . Risperdal [Risperidone]     tremors    Metabolic Disorder Labs: Lab Results  Component Value Date   HGBA1C 5.8 (H) 05/30/2015   No results found for: PROLACTIN Lab Results  Component Value Date   CHOL 169 05/30/2015   TRIG 135 05/30/2015   HDL 44 05/30/2015   CHOLHDL 3.8 05/30/2015   VLDL 36 01/15/2015   LDLCALC 98 05/30/2015   LDLCALC 70 01/15/2015     Current Medications: Current Outpatient Medications  Medication Sig Dispense Refill  . hydrOXYzine (VISTARIL) 25 MG capsule Take 1 capsule (25 mg total) by mouth 2 (two) times daily as needed. 60 capsule 4  . propranolol (INDERAL) 10 MG  tablet Take 1 tablet (10 mg total) by mouth 2 (two) times daily. Pt has supply 60 tablet 0  . lithium carbonate 150 MG capsule 1 pill in am and 2 pills at bed time 90 capsule 4  . QUEtiapine (SEROQUEL) 50 MG tablet Take 1 tablet (50 mg total) by mouth at bedtime. 30 tablet 0   No current facility-administered medications for this visit.     Neurologic: Headache: No Seizure: No Paresthesias:No  Musculoskeletal: Strength & Muscle Tone: within normal limits Gait & Station: normal Patient leans: N/A  Psychiatric Specialty Exam: ROS  Blood pressure 127/74, pulse (!) 105, temperature 98.6 F (37 C), temperature source Oral, height 5' 0.63" (1.54 m), weight 169 lb 3.2 oz (76.7 kg), last menstrual period 02/09/2008.Body mass index is 32.36 kg/m.  General Appearance: Casual and Fairly Groomed  Eye Contact:  Fair  Speech:  Clear and Coherent  Volume:  Normal  Mood:  Anxious  Affect:  Congruent  Thought Process:  Goal Directed  Orientation:  Full (Time, Place, and Person)  Thought Content:  WDL  Suicidal Thoughts:  No  Homicidal Thoughts:  No  Memory:  Immediate;   Fair  Judgement:  Fair  Insight:  Fair  Psychomotor Activity:  Normal  Concentration:  Fair  Recall:  [employer] of Knowledge:Fair  Language: Fair  Akathisia:  No  Handed:  Right  AIMS (if indicated):    Assets:  Communication Skills Desire for Improvement Physical Health Social Support  ADL's:  Intact  Cognition: WNL  Sleep:      Treatment Plan Summary: Medication management   Discussed with patient about the medications treatment risks benefits and alternatives Continue lithium 150 mg- 1 pill in the morning and 2 pills at bedtime. Continue propranolol 10 mg by mouth twice a day Discontinue hydroxyzine Will start her on Seroquel 50 mg at bedtime. Advised patient to follow up in 2 weeks or earlier depending on her symptoms.         More than 50% of the time spent in psychoeducation, counseling and  coordination of care.    This note was  generated in part or whole with voice recognition software. Voice regonition is usually quite accurate but there are transcription errors that can and very often do occur. I apologize for any typographical errors that were not detected and corrected.    Rainey Pines, MD 12/3/20184:08 PM

## 2017-01-10 NOTE — Telephone Encounter (Signed)
Pt was seen today she walked in . Pt seen by dr. Gretel Acre.

## 2017-01-25 ENCOUNTER — Telehealth: Payer: Self-pay | Admitting: Psychiatry

## 2017-01-25 ENCOUNTER — Telehealth: Payer: Self-pay

## 2017-01-25 NOTE — Telephone Encounter (Signed)
Yes I can do that, but of she is having worsening sx or is in a crisis , she should go to the nearest ED . Please let her know.

## 2017-01-25 NOTE — Telephone Encounter (Signed)
Pt was called and told that orders were ready for pick up and that if she needed to be seen she needed to go ahead and see someone or if she feels really bad go to the er. Pt states that she will think about what to do.

## 2017-01-25 NOTE — Telephone Encounter (Signed)
pt called states that she feels like her lithium levels are off. she wanted to know if you would order labwork and then she comes in when you have the results?

## 2017-01-26 NOTE — Telephone Encounter (Signed)
Dr. Shea Evans order labwork. Pt was called and told that orders for labwork would be at front desk for her to pick up and that when she comes in she needed to go ahead and make an appt to be seen.

## 2017-01-31 ENCOUNTER — Ambulatory Visit: Payer: No Typology Code available for payment source | Admitting: Psychiatry

## 2017-02-10 ENCOUNTER — Encounter: Payer: Self-pay | Admitting: Psychiatry

## 2017-02-10 ENCOUNTER — Ambulatory Visit (INDEPENDENT_AMBULATORY_CARE_PROVIDER_SITE_OTHER): Payer: No Typology Code available for payment source | Admitting: Psychiatry

## 2017-02-10 VITALS — BP 127/73 | HR 111 | Ht 60.0 in | Wt 164.8 lb

## 2017-02-10 DIAGNOSIS — F1220 Cannabis dependence, uncomplicated: Secondary | ICD-10-CM | POA: Diagnosis not present

## 2017-02-10 DIAGNOSIS — F603 Borderline personality disorder: Secondary | ICD-10-CM | POA: Diagnosis not present

## 2017-02-10 DIAGNOSIS — F3160 Bipolar disorder, current episode mixed, unspecified: Secondary | ICD-10-CM | POA: Diagnosis not present

## 2017-02-10 DIAGNOSIS — F17200 Nicotine dependence, unspecified, uncomplicated: Secondary | ICD-10-CM

## 2017-02-10 NOTE — Progress Notes (Signed)
Sheryl Perez  OP Progress Note  02/11/2017 8:59 AM Sheryl Perez  MRN:  [id number]  Chief Complaint:  Chief Complaint    Follow-up    ' I want to get Sheryl Perez level."  HPI: Sheryl Perez is a 57 year old Caucasian female who is separated from her husband, unemployed, lives in [place] with her son, has a history of bipolar disorder borderline personality disorder cannabis use disorder, tobacco use disorder as well as multiple medical problems including chronic pain who presented to the clinic today for a follow-up visit.  Sheryl Perez presented very late for her appointment.  She reported that she was stuck in traffic.  This is patient's first visit with Probation officer.  She used to follow up with Dr. Gretel Perez here in clinic.  Reports that she has been struggling with some pain problems from her arthritis.  She reports that she is currently taking naproxen.  She reports that  she is taking her naproxen and hence decided to take a lower dose of lithium.  She was supposed to take lithium 150 mg in the morning and 300 at bedtime per Dr. Gretel Perez.  She however has been only taking 150 mg at bedtime.  Reports a history of lithium toxicity in the past when she was admitted to [facility].  She reports that makes her anxious.  She also reports that she decided to restart her Seroquel because she had some pills left in her bottle.  She reports she is taking Seroquel 50 mg at bedtime now.  She reports that Dr. Gretel Perez had taken her off the Seroquel in the past because she had reported to Dr. Gretel Perez that it made her psoriasis flare up.  Patient presented as hypomanic during this visit.  She appeared to be slightly restless with pressured speech as well as had mood lability.  She was noted to be tearful when she talked about her father who passed away.  She reports that the holidays are always hard for her.  She currently denies any side effects to her medications.  She reports she presented today so that she can get a lab slip so that she can get the  lithium level done.  Discussed with her that lab slip was made available for her in December when she had called the clinic with same problem.  Patient reports that she may have forgotten to come and collect the lab slip.  She continues to smoke cigarettes.  She reports she is currently working on cutting it down.  She reports she does not want to be on any medications to help her with smoking cessation at this time.  Because they can be expensive and she does not have the finances at this time.  She continues to live with her son who is supportive.  Her husband also lives in the same house.  She however reports that she is currently not in a relationship with him and he just stays in the same house because he has nowhere else to go.     Visit Diagnosis:    ICD-10-CM   1. Bipolar I disorder, most recent episode mixed ([place]) F31.60   2. Borderline personality disorder ([place]) F60.3   3. Tobacco use disorder F17.200   4. Cannabis use disorder, moderate, dependence ([facility]) F12.20     Past Psychiatric History: Bipolar disorder, borderline personality disorder.  Patient used to follow up with Dr. Gretel Perez in the past.  Patient has had inpatient admissions at [facility] in the past.  Patient was diagnosed with bipolar  disorder in 2007.  She used to follow up with a psychiatrist in [place] in the past.  She reports a history of having lithium toxicity in the past and she was admitted at [facility].  Previous trials of Risperdal-akathisia, Abilify-catatonia, Lamictal, lithium, Prozac, trazodone.  Past Medical History:  Past Medical History:  Diagnosis Date  . Back injury   . 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 Bipolar disorder ([place])   . Chest pain, unspecified   . Chronic low back pain 10/23/2013  . Chronic UTI   . Depression   . Dysfunction of eustachian tube   . Enlargement of lymph nodes   . History of Bell's palsy Right  . Injury, other and unspecified, knee, leg, ankle, and foot   .  MRSA (methicillin resistant staph aureus) culture positive 07-11-14   abscess  . Other specified disease of hair and hair follicles   . Rectal prolapse   . Unspecified symptom associated with female genital organs     Past Surgical History:  Procedure Laterality Date  . ABDOMINAL HYSTERECTOMY    . bladder tack  2010  . CHOLECYSTECTOMY  2002  . FOOT SURGERY Bilateral 2004   bunion  . GANGLION CYST EXCISION  2002  . INCISION AND DRAINAGE ABSCESS  04-16-15  . PARTIAL HYSTERECTOMY  2010   uterus removed    Family Psychiatric History: Father - depression, sister - depression, brother - alcohol abuse, depression  Family History:  Family History  Problem Relation Age of Onset  . Parkinson's disease Maternal Grandfather   . Diabetes Father   . Cancer Father        brain tumor  . Depression Father   . Diabetes Paternal Grandfather   . Colon polyps Mother   . Heart disease Maternal Grandmother   . Heart attack Maternal Grandmother   . Stroke Maternal Grandmother   . Depression Sister   . Alcohol abuse Brother   . Depression Brother   . Breast cancer Paternal Aunt   . Breast cancer Cousin    Substance abuse history: Patient reports she smokes cannabis every single day because it helps her.   Social History: She is separated from her husband.  Her husband used to be in prison in the past.  He currently lives with her son.  She also  lives in the same house with her son in [place].  She however reports that they are emotionally separated and are not in a relationship.  She reports he lives in the same house because he does not have another place to go to.  Her son supports her financially. Social History   Socioeconomic History  . Marital status: Married    Spouse name: None  . Number of children: 3  . Years of education: hs  . Highest education level: Some college, no degree  Social Needs  . Financial resource strain: Hard  . Food insecurity - worry: Never true  . Food  insecurity - inability: Never true  . Transportation needs - medical: No  . Transportation needs - non-medical: No  Occupational History  . Occupation: Designer, multimedia: [employer]    Comment: [employer]  Tobacco Use  . Smoking status: Current Every Day Smoker    Packs/day: 1.00    Years: 40.00    Pack years: 40.00    Types: Cigarettes  . Smokeless tobacco: Never Used  Substance and Sexual Activity  . Alcohol use: No  Alcohol/week: 0.6 oz    Types: 1 Glasses of wine per week    Comment: occasional  . Drug use: Yes    Types: Marijuana    Comment: last used 05-20-15  . Sexual activity: Yes    Birth control/protection: None  Other Topics Concern  . None  Social History Narrative   Patient lives at home with her husband [name]).   Patient works full time.   Education CNA    Right handed.   Caffeine Tea , Soda.    Allergies:  Allergies  Allergen Reactions  . Other Other (See Comments)    PT states that anything with the sides effects "may cause rash" she cannot take due to her psoriasis, it will make the patches on her hand so thick she can't use her hands.   . Sulfa Antibiotics Swelling    Diarrhea,dizziness, tongue swelling  . Abilify [Aripiprazole] Other (See Comments)  . Cephalosporins Rash  . Erythromycin Other (See Comments)    Abd. pain  . Lamictal [Lamotrigine] Rash    psorasis worsen  . Risperdal [Risperidone]     tremors    Metabolic Disorder Labs: Lab Results  Component Value Date   HGBA1C 5.8 (H) 05/30/2015   No results found for: PROLACTIN Lab Results  Component Value Date   CHOL 169 05/30/2015   TRIG 135 05/30/2015   HDL 44 05/30/2015   CHOLHDL 3.8 05/30/2015   VLDL 36 01/15/2015   LDLCALC 98 05/30/2015   LDLCALC 70 01/15/2015   Lab Results  Component Value Date   TSH 2.940 06/15/2016   TSH 1.682 12/02/2015    Therapeutic Level Labs: Lab Results  Component Value Date   LITHIUM 0.3 (L) 06/15/2016    LITHIUM <0.06 (L) 12/02/2015   No results found for: VALPROATE No components found for:  CBMZ  Current Medications: Current Outpatient Medications  Medication Sig Dispense Refill  . hydrOXYzine (VISTARIL) 25 MG capsule Take 1 capsule (25 mg total) by mouth 2 (two) times daily as needed. 60 capsule 4  . lithium carbonate 150 MG capsule 1 pill in am and 2 pills at bed time 90 capsule 4  . propranolol (INDERAL) 10 MG tablet Take 1 tablet (10 mg total) by mouth 2 (two) times daily. Pt has supply 60 tablet 0  . QUEtiapine (SEROQUEL) 50 MG tablet Take 1 tablet (50 mg total) by mouth at bedtime. 30 tablet 0   No current facility-administered medications for this visit.      Musculoskeletal: Strength & Muscle Tone: within normal limits Gait & Station: normal Patient leans: N/A  Psychiatric Specialty Exam: Review of Systems  Psychiatric/Behavioral: The patient is nervous/anxious.   All other systems reviewed and are negative.   Blood pressure 127/73, pulse (!) 111, height 5' (1.524 m), weight 164 lb 12.8 oz (74.8 kg), last menstrual period 02/09/2008.Body mass index is 32.19 kg/m.  General Appearance: Casual  Eye Contact:  Fair  Speech:  Pressured  Volume:  Normal  Mood:  Anxious  Affect:  Labile and Tearful  Thought Process:  Goal Directed and Descriptions of Associations: Intact  Orientation:  Full (Time, Place, and Person)  Thought Content: Logical   Suicidal Thoughts:  No  Homicidal Thoughts:  No  Memory:  Immediate;   Fair Recent;   Fair Remote;   Fair  Judgement:  Fair  Insight:  Fair  Psychomotor Activity:  Normal  Concentration:  Concentration: Fair and Attention Span: Fair  Recall:  [employer] of Knowledge: Fair  Language: Fair  Akathisia:  No  Handed:  Right  AIMS (if indicated): 0  Assets:  Communication Skills Desire for Improvement Social Support  ADL's:  Intact  Cognition: WNL  Sleep:  Fair   Screenings: AIMS     Office Visit from 05/03/2016 in  [place] Admission (Discharged) from 01/15/2015 in [place] Total Score  0  0    AUDIT     Admission (Discharged) from 01/15/2015 in [place]  Alcohol Use Disorder Identification Test Final Score (AUDIT)  1    GAD-7     Office Visit from 04/18/2015 in [facility]  Total GAD-7 Score  14    PHQ2-9     Office Visit from 04/18/2015 in [facility] Office Visit from 07/11/2014 in [place]  PHQ-2 Total Score  4  0  PHQ-9 Total Score  14  No data       Assessment and Plan: Taunja is a 57 year old Caucasian female who has a history of bipolar disorder, cannabis use disorder, medical problems including psoriasis, arthritis who presented to the clinic today for a follow-up visit.  Patient presented extremely late for her appointment.  She reported that she was stuck in traffic and she tried to call the clinic several times.  She reports she is here to collect a lab slip so that she can get her lithium level checked.  She reports she has been taking naproxen for pain and she is concerned about getting another lithium toxicity.  She was admitted at [facility] in the past for lithium toxicity.  She otherwise denies any other concerns.  Denies any suicidality.  Discussed plan as noted below.  Plan For bipolar disorder Continue lithium as scheduled.  She reports she is taking only 150 mg at bedtime. Provided lab slip to collect lithium level, tsh, bmp. Discussed with her to get it in the morning . Discussed with her that writer will give her a call once the report is received. Continue Seroquel 50 mg at bedtime.  For anxiety symptoms Continue propranolol 10 mg p.o. twice daily.  For insomnia Continue Seroquel 50 mg p.o. nightly  For tobacco use disorder Provided smoking cessation counseling  For cannabis use disorder Provided substance abuse counseling-we will continue  to monitor this during subsequent visits.  Borderline personality disorder Continue to monitor her closely.  She denies any self-injurious behaviors.  Will refer for CBT if patient is open to it.  She will return to see writer in 4-5 weeks or sooner if needed.  Discussed crisis plan.  Have reviewed medical records in EHR Per Dr. Gretel Perez.  More than 50 % of the time was spent for psychoeducation and supportive psychotherapy and care coordination.  This note was generated in part or whole with voice recognition software. Voice recognition is usually quite accurate but there are transcription errors that can and very often do occur. I apologize for any typographical errors that were not detected and corrected.       Ursula Alert, Perez 02/11/2017, 8:59 AM

## 2017-02-11 ENCOUNTER — Telehealth: Payer: Self-pay

## 2017-02-11 ENCOUNTER — Encounter: Payer: Self-pay | Admitting: Psychiatry

## 2017-02-11 NOTE — Telephone Encounter (Signed)
pt called states she seen you yesterday . states that she though you were going to give her something to take for her eatting because she smokes weed.

## 2017-02-11 NOTE — Telephone Encounter (Signed)
Will discuss this when I see her back in clinic . She was not ready for that yesterday. Please let her know. Thanks.

## 2017-02-24 ENCOUNTER — Ambulatory Visit: Payer: No Typology Code available for payment source | Admitting: Psychiatry

## 2017-03-07 ENCOUNTER — Ambulatory Visit: Payer: No Typology Code available for payment source | Admitting: Psychiatry

## 2017-03-14 ENCOUNTER — Ambulatory Visit (INDEPENDENT_AMBULATORY_CARE_PROVIDER_SITE_OTHER): Payer: No Typology Code available for payment source | Admitting: Psychiatry

## 2017-03-14 ENCOUNTER — Other Ambulatory Visit: Payer: Self-pay

## 2017-03-14 ENCOUNTER — Encounter: Payer: Self-pay | Admitting: Psychiatry

## 2017-03-14 VITALS — BP 146/80 | HR 106 | Temp 98.0°F | Wt 163.4 lb

## 2017-03-14 DIAGNOSIS — F3160 Bipolar disorder, current episode mixed, unspecified: Secondary | ICD-10-CM | POA: Diagnosis not present

## 2017-03-14 DIAGNOSIS — F603 Borderline personality disorder: Secondary | ICD-10-CM | POA: Diagnosis not present

## 2017-03-14 DIAGNOSIS — F17200 Nicotine dependence, unspecified, uncomplicated: Secondary | ICD-10-CM

## 2017-03-14 NOTE — Progress Notes (Signed)
Psychiatric MD Progress Note   Patient Identification: Sheryl Perez MRN:  [id number] Date of Evaluation:  03/14/2017 Referral Source: Transfer from [employer] Complaint:   Chief Complaint    Follow-up; Medication Refill     Visit Diagnosis:    ICD-10-CM   1. Bipolar I disorder, most recent episode mixed ([place]) F31.60   2. Borderline personality disorder ([place]) F60.3   3. Tobacco use disorder F17.200     History of Present Illness:    Patient is a 57 year old female with history of bipolar disorder who presented for follow-up. She was last seen by Dr. Shea Evans. She reported that she wants to have her paperwork filled out as she has been applying for disability.patient reported that they are reviewing her case. She was hyperverbal as usual and reported that she has been taking her medications as prescribed. She is taking lithium and Seroquel. She does not want to go higher on the dose of the Seroquel as she reported that it will make her too sleepy and tired. Patient reported that she takes propranolol and lithium doses as prescribed. She cannot follow up with her primary care physician as she has no insurance at this time and she is trying to get some money to go to their office to have her paperwork filled out.   She denied having any suicidal homicidal ideations or plans she denied having any perceptual disturbances. She reported that she will continue to follow-up with Dr. Shea Evans on a regular basis.              Associated Signs/Symptoms: Depression Symptoms:  anxiety, (Hypo) Manic Symptoms:  Labiality of Mood, Anxiety Symptoms:  none Psychotic Symptoms:  none PTSD Symptoms: Negative NA  Past Psychiatric History:  Patient reported that she has started following with a psychiatrist in [place] who diagnosed her with bipolar disorder in 2007. She was seen in that practice for approximately 2 years but says she owe them money she left the practice. She reported that  she started following her primary care physician and her  medical doctor was prescribing her medications. He left them in 2009 and was admitted to [facility] as her lithium level was high. She reported that she was admitted to [facility] twice. One time it was due to manic episode and the other time due to lithium toxicity She reported that she started following at [facility] psychiatric practice. Her husband was incarcerated in 2014 and she stopped following her psychiatrist as she wanted to be manic to control her personal issues. She ran out of her medications and was not taking any medication for almost 1 year. She reported that he came out of the prison in 2015. Patient reported that she does not want to discuss about the reason he was incarcerated. However she was hospitalized 2 [facility] inpatient behavioral health unit in December 2016 due to manic behavior She was started on Seroquel and lorazepam and she responded well to the medications. She  was discharged to follow up at [facility] but she does not like the practice  Patient denied any history of suicide attempts  Previous Psychotropic Medications:  Risperdal -akathsia Abilify- h/o catatonia Lamictal Lithium - took for 6 years, but her level went higher, no side effects. [facility].  Prozac Trazodone-     Substance Abuse History in the last 12 months:  Yes.    Alcohol - occasionally MJ- Daily- Smokes 2-3 puffs  Smokes 1/2 pack per day  Consequences of Substance Abuse: Negative  NA  Past Medical History:  Past Medical History:  Diagnosis Date  . Back injury   . 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 Bipolar disorder ([place])   . Chest pain, unspecified   . Chronic low back pain 10/23/2013  . Chronic UTI   . Depression   . Dysfunction of eustachian tube   . Enlargement of lymph nodes   . History of Bell's palsy Right  . Injury, other and unspecified, knee, leg, ankle, and foot   . MRSA (methicillin resistant  staph aureus) culture positive 07-11-14   abscess  . Other specified disease of hair and hair follicles   . Rectal prolapse   . Unspecified symptom associated with female genital organs     Past Surgical History:  Procedure Laterality Date  . ABDOMINAL HYSTERECTOMY    . bladder tack  2010  . CHOLECYSTECTOMY  2002  . FOOT SURGERY Bilateral 2004   bunion  . GANGLION CYST EXCISION  2002  . INCISION AND DRAINAGE ABSCESS  04-16-15  . PARTIAL HYSTERECTOMY  2010   uterus removed    Family Psychiatric History:   Daughter- PTSD, she takes medications for the same.   Family History:  Family History  Problem Relation Age of Onset  . Parkinson's disease Maternal Grandfather   . Diabetes Father   . Cancer Father        brain tumor  . Depression Father   . Diabetes Paternal Grandfather   . Colon polyps Mother   . Heart disease Maternal Grandmother   . Heart attack Maternal Grandmother   . Stroke Maternal Grandmother   . Depression Sister   . Alcohol abuse Brother   . Depression Brother   . Breast cancer Paternal Aunt   . Breast cancer Cousin     Social History:   Social History   Socioeconomic History  . Marital status: Married    Spouse name: None  . Number of children: 3  . Years of education: hs  . Highest education level: Some college, no degree  Social Needs  . Financial resource strain: Hard  . Food insecurity - worry: Never true  . Food insecurity - inability: Never true  . Transportation needs - medical: No  . Transportation needs - non-medical: No  Occupational History  . Occupation: Designer, multimedia: [employer]    Comment: [employer]  Tobacco Use  . Smoking status: Current Every Day Smoker    Packs/day: 1.00    Years: 40.00    Pack years: 40.00    Types: Cigarettes  . Smokeless tobacco: Never Used  Substance and Sexual Activity  . Alcohol use: No    Alcohol/week: 0.6 oz    Types: 1 Glasses of wine per week    Comment:  occasional  . Drug use: Yes    Types: Marijuana    Comment: last used 05-20-15  . Sexual activity: Yes    Birth control/protection: None  Other Topics Concern  . None  Social History Narrative   Patient lives at home with her husband [name]).   Patient works full time.   Education CNA    Right handed.   Caffeine Tea , Soda.    Additional Social History:   Married x 24 years. Lives with husband. Has 3 children and 4 grandchildren. 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   Allergies:   Allergies  Allergen Reactions  . Other Other (See Comments)  PT states that anything with the sides effects "may cause rash" she cannot take due to her psoriasis, it will make the patches on her hand so thick she can't use her hands.   . Sulfa Antibiotics Swelling    Diarrhea,dizziness, tongue swelling  . Abilify [Aripiprazole] Other (See Comments)  . Cephalosporins Rash  . Erythromycin Other (See Comments)    Abd. pain  . Lamictal [Lamotrigine] Rash    psorasis worsen  . Risperdal [Risperidone]     tremors    Metabolic Disorder Labs: Lab Results  Component Value Date   HGBA1C 5.8 (H) 05/30/2015   No results found for: PROLACTIN Lab Results  Component Value Date   CHOL 169 05/30/2015   TRIG 135 05/30/2015   HDL 44 05/30/2015   CHOLHDL 3.8 05/30/2015   VLDL 36 01/15/2015   LDLCALC 98 05/30/2015   LDLCALC 70 01/15/2015     Current Medications: Current Outpatient Medications  Medication Sig Dispense Refill  . hydrOXYzine (VISTARIL) 25 MG capsule Take 1 capsule (25 mg total) by mouth 2 (two) times daily as needed. 60 capsule 4  . lithium carbonate 150 MG capsule 1 pill in am and 2 pills at bed time 90 capsule 4  . propranolol (INDERAL) 10 MG tablet Take 1 tablet (10 mg total) by mouth 2 (two) times daily. Pt has supply 60 tablet 0  . QUEtiapine (SEROQUEL) 50 MG tablet Take 1 tablet (50 mg total) by mouth at bedtime. 30 tablet 0   No current facility-administered  medications for this visit.     Neurologic: Headache: No Seizure: No Paresthesias:No  Musculoskeletal: Strength & Muscle Tone: within normal limits Gait & Station: normal Patient leans: N/A  Psychiatric Specialty Exam: ROS  Blood pressure (!) 146/80, pulse (!) 106, temperature 98 F (36.7 C), temperature source Oral, weight 163 lb 6.4 oz (74.1 kg), last menstrual period 02/09/2008.Body mass index is 31.91 kg/m.  General Appearance: Casual and Fairly Groomed  Eye Contact:  Fair  Speech:  Clear and Coherent  Volume:  Normal  Mood:  Anxious  Affect:  Congruent  Thought Process:  Goal Directed  Orientation:  Full (Time, Place, and Person)  Thought Content:  WDL  Suicidal Thoughts:  No  Homicidal Thoughts:  No  Memory:  Immediate;   Fair  Judgement:  Fair  Insight:  Fair  Psychomotor Activity:  Normal  Concentration:  Fair  Recall:  [employer] of Knowledge:Fair  Language: Fair  Akathisia:  No  Handed:  Right  AIMS (if indicated):    Assets:  Communication Skills Desire for Improvement Physical Health Social Support  ADL's:  Intact  Cognition: WNL  Sleep:      Treatment Plan Summary: Medication management   Discussed with patient about the medications treatment risks benefits and alternatives Continue lithium 150 mg- 1 pill in the morning and 2 pills at bedtime. Continue propranolol 10 mg by mouth twice a day  Seroquel 50 mg at bedtime.  Patient reported that she has enough supply of the medications at home and will follow-up next month.      More than 50% of the time spent in psychoeducation, counseling and coordination of care.    This note was generated in part or whole with voice recognition software. Voice regonition is usually quite accurate but there are transcription errors that can and very often do occur. I apologize for any typographical errors that were not detected and corrected.    Rainey Pines, MD 2/4/20193:39 PM

## 2017-04-04 ENCOUNTER — Other Ambulatory Visit: Payer: Self-pay | Admitting: Psychiatry

## 2017-04-12 ENCOUNTER — Other Ambulatory Visit: Payer: Self-pay

## 2017-04-12 ENCOUNTER — Ambulatory Visit: Payer: Self-pay | Admitting: Psychiatry

## 2017-04-12 ENCOUNTER — Encounter: Payer: Self-pay | Admitting: Psychiatry

## 2017-04-12 VITALS — BP 134/85 | HR 112 | Temp 98.1°F | Wt 162.2 lb

## 2017-04-12 DIAGNOSIS — F17200 Nicotine dependence, unspecified, uncomplicated: Secondary | ICD-10-CM

## 2017-04-12 DIAGNOSIS — F1220 Cannabis dependence, uncomplicated: Secondary | ICD-10-CM

## 2017-04-12 DIAGNOSIS — F3162 Bipolar disorder, current episode mixed, moderate: Secondary | ICD-10-CM

## 2017-04-12 DIAGNOSIS — F603 Borderline personality disorder: Secondary | ICD-10-CM

## 2017-04-12 MED ORDER — QUETIAPINE FUMARATE 50 MG PO TABS: 50.0000 mg | ORAL_TABLET | Freq: Every day | ORAL | 1 refills | Status: DC

## 2017-04-12 MED ORDER — MEGESTROL ACETATE 400 MG/10ML PO SUSP: 400.0000 mg | Freq: Every day | ORAL | 0 refills | Status: DC

## 2017-04-12 MED ORDER — PROPRANOLOL HCL 10 MG PO TABS: 10.0000 mg | ORAL_TABLET | Freq: Two times a day (BID) | ORAL | 1 refills | Status: DC

## 2017-04-12 NOTE — Progress Notes (Signed)
[place] MD OP Progress Note  04/12/2017 10:09 AM Sheryl Perez  MRN:  [id number]  Chief Complaint: ' I am anxious." Chief Complaint    Follow-up     HPI: Sheryl Perez is a 57 year old Caucasian female with history of bipolar disorder, borderline personality disorder, cannabis use disorder, tobacco use disorder, arthritis, presented to the clinic today for a follow-up visit.  Patient used to follow up with Dr. Gretel Acre in the past.  Patient was seen by writer in January for one visit.  Patient thereafter had another appointment with Dr. Gretel Acre in February.  Patient today presented with her disability paperwork for a loan application.  Patient also reports that she needs medications refilled.  Patient reports some anxiety symptoms regarding her current situation and the disability application.  Patient otherwise denies any new concerns.  Patient reports she continues to struggle with appetite.  She reports she self medicates herself with cannabis to increase her appetite.  Provided substance abuse counseling discussed with her that we can try another medication to help with her appetite so that she can quit smoking.  Patient agrees with plan.  Discussed several medications with patient including olanzapine to replace the Seroquel or adding mirtazapine to her current medication regimen.  The patient however reports she does not want to make those changes and agrees to an appetite stimulant like Megace.  Discussed with her to take the medication for a week or so to see if that will help.  Patient also does not have health insurance and has trouble filling her prescriptions.  Patient reports her sleep is good.  Patient denies any suicidality.  Patient denies any perceptual disturbances at this time.  Patient continues to spend time with her grandchildren and helps take care of them.  Her husband, son and daughter-in-law works, they take different schedule of work so that someone can be there with her all the time to  help take care of the grandchildren.  She was unable to get her labs which were ordered last visit.  She reports she will need another lab slip today.  We will give her the same. Visit Diagnosis:    ICD-10-CM   1. Bipolar 1 disorder, mixed, moderate (HCC) F31.62 megestrol (MEGACE) 400 MG/10ML suspension    propranolol (INDERAL) 10 MG tablet    QUEtiapine (SEROQUEL) 50 MG tablet  2. Cannabis use disorder, moderate, dependence (HCC) F12.20   3. Tobacco use disorder F17.200   4. Borderline personality disorder ([place]) F60.3     Past Psychiatric History: Bipolar disorder, borderline personality disorder.  Patient used to follow up with Dr. Gretel Acre in the past.  Patient has had inpatient admissions at [facility] in the past.  Patient was diagnosed with bipolar disorder in 2007.  She used to follow-up with a psychiatrist in [place] in the past.  She reports a history of having lithium toxicity in the past and was admitted at [facility].  Previous trials of Risperdal-akathisia, Abilify-catatonia, Lamictal, lithium, Prozac, trazodone.  Past Medical History:  Past Medical History:  Diagnosis Date  . Back injury   . 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 Bipolar disorder ([place])   . Chest pain, unspecified   . Chronic low back pain 10/23/2013  . Chronic UTI   . Depression   . Dysfunction of eustachian tube   . Enlargement of lymph nodes   . History of Bell's palsy Right  . Injury, other and unspecified, knee, leg, ankle, and foot   . MRSA (  methicillin resistant staph aureus) culture positive 07-11-14   abscess  . Other specified disease of hair and hair follicles   . Rectal prolapse   . Unspecified symptom associated with female genital organs     Past Surgical History:  Procedure Laterality Date  . ABDOMINAL HYSTERECTOMY    . bladder tack  2010  . CHOLECYSTECTOMY  2002  . FOOT SURGERY Bilateral 2004   bunion  . GANGLION CYST EXCISION  2002  . INCISION AND DRAINAGE ABSCESS  04-16-15  .  PARTIAL HYSTERECTOMY  2010   uterus removed    Family Psychiatric History: Father-depression, sister-depression, brother-alcohol abuse, depression  Family History:  Family History  Problem Relation Age of Onset  . Parkinson's disease Maternal Grandfather   . Diabetes Father   . Cancer Father        brain tumor  . Depression Father   . Diabetes Paternal Grandfather   . Colon polyps Mother   . Heart disease Maternal Grandmother   . Heart attack Maternal Grandmother   . Stroke Maternal Grandmother   . Depression Sister   . Alcohol abuse Brother   . Depression Brother   . Breast cancer Paternal Aunt   . Breast cancer Cousin    Substance abuse history: She reports she smokes cannabis regularly because it helps her.  Social History:She is separated.  Her husband used to be in prison in the past.  He currently lives with her son.  She also lives in the same house with her son in [place].  She however reports that they are emotionally separated and not in a relationship.  She reports he lives in the same house because he does not have another place to go to.  Her son supports her financially. Social History   Socioeconomic History  . Marital status: Married    Spouse name: None  . Number of children: 3  . Years of education: hs  . Highest education level: Some college, no degree  Social Needs  . Financial resource strain: Hard  . Food insecurity - worry: Never true  . Food insecurity - inability: Never true  . Transportation needs - medical: No  . Transportation needs - non-medical: No  Occupational History  . Occupation: Designer, multimedia: [employer]    Comment: [employer]  Tobacco Use  . Smoking status: Current Every Day Smoker    Packs/day: 1.00    Years: 40.00    Pack years: 40.00    Types: Cigarettes  . Smokeless tobacco: Never Used  Substance and Sexual Activity  . Alcohol use: No    Alcohol/week: 0.6 oz    Types: 1 Glasses of  wine per week    Comment: occasional  . Drug use: Yes    Types: Marijuana    Comment: last used 05-20-15  . Sexual activity: Yes    Birth control/protection: None  Other Topics Concern  . None  Social History Narrative   Patient lives at home with her husband [name]).   Patient works full time.   Education CNA    Right handed.   Caffeine Tea , Soda.    Allergies:  Allergies  Allergen Reactions  . Other Other (See Comments)    PT states that anything with the sides effects "may cause rash" she cannot take due to her psoriasis, it will make the patches on her hand so thick she can't use her hands.   . Sulfa Antibiotics Swelling  Diarrhea,dizziness, tongue swelling  . Abilify [Aripiprazole] Other (See Comments)  . Cephalosporins Rash  . Erythromycin Other (See Comments)    Abd. pain  . Lamictal [Lamotrigine] Rash    psorasis worsen  . Risperdal [Risperidone]     tremors    Metabolic Disorder Labs: Lab Results  Component Value Date   HGBA1C 5.8 (H) 05/30/2015   No results found for: PROLACTIN Lab Results  Component Value Date   CHOL 169 05/30/2015   TRIG 135 05/30/2015   HDL 44 05/30/2015   CHOLHDL 3.8 05/30/2015   VLDL 36 01/15/2015   LDLCALC 98 05/30/2015   LDLCALC 70 01/15/2015   Lab Results  Component Value Date   TSH 2.940 06/15/2016   TSH 1.682 12/02/2015    Therapeutic Level Labs: Lab Results  Component Value Date   LITHIUM 0.3 (L) 06/15/2016   LITHIUM <0.06 (L) 12/02/2015   No results found for: VALPROATE No components found for:  CBMZ  Current Medications: Current Outpatient Medications  Medication Sig Dispense Refill  . hydrOXYzine (VISTARIL) 25 MG capsule Take 1 capsule (25 mg total) by mouth 2 (two) times daily as needed. 60 capsule 4  . lithium carbonate 150 MG capsule 1 pill in am and 2 pills at bed time 90 capsule 4  . megestrol (MEGACE) 400 MG/10ML suspension Take 10 mLs (400 mg total) by mouth daily. 240 mL 0  . propranolol  (INDERAL) 10 MG tablet Take 1 tablet (10 mg total) by mouth 2 (two) times daily. Pt has supply 60 tablet 1  . QUEtiapine (SEROQUEL) 50 MG tablet Take 1 tablet (50 mg total) by mouth at bedtime. 30 tablet 1   No current facility-administered medications for this visit.      Musculoskeletal: Strength & Muscle Tone: within normal limits Gait & Station: normal Patient leans: N/A  Psychiatric Specialty Exam: Review of Systems  Psychiatric/Behavioral: The patient is nervous/anxious.   All other systems reviewed and are negative.   Last menstrual period 02/09/2008.There is no height or weight on file to calculate BMI.  General Appearance: Casual  Eye Contact:  Fair  Speech:  Pressured, less pressured than her last visit  Volume:  Normal  Mood:  Anxious  Affect:  Appropriate  Thought Process:  Goal Directed and Descriptions of Associations: Intact  Orientation:  Full (Time, Place, and Person)  Thought Content: Logical   Suicidal Thoughts:  No  Homicidal Thoughts:  No  Memory:  Immediate;   Fair Recent;   Fair Remote;   Fair  Judgement:  Fair  Insight:  Fair  Psychomotor Activity:  Normal  Concentration:  Concentration: Fair and Attention Span: Fair  Recall:  [employer] of Knowledge: Fair  Language: Fair  Akathisia:  No  Handed:  Right  AIMS (if indicated):0  Assets:  Communication Skills Desire for Improvement Social Support  ADL's:  Intact  Cognition: WNL  Sleep:  Fair   Screenings: AIMS     Office Visit from 05/03/2016 in [place] Admission (Discharged) from 01/15/2015 in [place] Total Score  0  0    AUDIT     Admission (Discharged) from 01/15/2015 in [place]  Alcohol Use Disorder Identification Test Final Score (AUDIT)  1    GAD-7     Office Visit from 04/18/2015 in [facility]  Total GAD-7 Score  14    PHQ2-9     Office Visit from 04/18/2015 in [place]  [facility] Office Visit from 07/11/2014 in [place]  PHQ-2 Total Score  4  0  PHQ-9 Total Score  14  No data       Assessment and Plan: Consuello is a 57 year old Caucasian female who has a history of bipolar disorder, cannabis use disorder, medical problems including psoriasis, arthritis, presented to the clinic today for a follow-up visit.  Patient today appeared to be less pressured and less hyperactive than her last visit with Probation officer.  The patient reports she is currently compliant on her medications however has not gotten her labs as ordered last visit.  Patient does have a history of lithium toxicity in the past and is aware of consequences of not following up on her lithium levels.  She continues to smoke cannabis and reports it is to help with her appetite.  Discussed making readjustments with her medications.  Plan as noted below.  Plan For bipolar disorder Continue Lithium as scheduled.  She reports she currently takes 150 mg in the morning and 300 mg at bedtime. Gave her another lab slip to get lithium level, TSH, BMP, hemoglobin A1c, lipid panel prolactin.   Continue Seroquel 50 mg p.o. nightly  For anxiety symptoms Continue propranolol 10 mg p.o. twice daily  For insomnia Continue Seroquel 50 mg p.o. nightly  For tobacco use disorder Provided smoking cessation counseling, she is not willing to quit.  For cannabis use disorder Provided substance abuse counseling-she reports she uses it 1-2 times per week now we will continue to monitor.  For appetite stimulation She declines adding olanzapine in the place of Seroquel or adding Remeron at this time.  She agrees to trying Megace 400 mg p.o. daily as needed.  Discussed medication with patient.  Borderline personality disorder Continue to monitor her closely.  She denies any self-injurious behavior.  Follow up in clinic in 1-2 months or sooner if needed.  I have reviewed medical records in EHR Per Dr.  Gretel Acre on 03/14/2017.  More than 50 % of the time was spent for psychoeducation and supportive psychotherapy and care coordination.  This note was generated in part or whole with voice recognition software. Voice recognition is usually quite accurate but there are transcription errors that can and very often do occur. I apologize for any typographical errors that were not detected and corrected.         Ursula Alert, MD 04/12/2017, 10:09 AM

## 2017-04-14 ENCOUNTER — Other Ambulatory Visit
Admission: RE | Admit: 2017-04-14 | Discharge: 2017-04-14 | Disposition: A | Discharge status: Home / Self Care | Payer: Self-pay | Source: Ambulatory Visit | Attending: Psychiatry | Admitting: Psychiatry

## 2017-04-14 ENCOUNTER — Telehealth: Payer: Self-pay | Admitting: Psychiatry

## 2017-04-14 DIAGNOSIS — F3162 Bipolar disorder, current episode mixed, moderate: Secondary | ICD-10-CM | POA: Insufficient documentation

## 2017-04-14 LAB — BASIC METABOLIC PANEL
Anion gap: 11 (ref 5–15)
BUN: 7 mg/dL (ref 6–20)
CO2: 22 mmol/L (ref 22–32)
Calcium: 9.5 mg/dL (ref 8.9–10.3)
Chloride: 103 mmol/L (ref 101–111)
Creatinine, Ser: 1.28 mg/dL — ABNORMAL HIGH (ref 0.44–1.00)
GFR, EST AFRICAN AMERICAN: 53 mL/min — AB (ref >60)
GFR, EST NON AFRICAN AMERICAN: 46 mL/min — AB (ref >60)
Glucose, Bld: 122 mg/dL — ABNORMAL HIGH (ref 65–99)
POTASSIUM: 3.1 mmol/L — AB (ref 3.5–5.1)
SODIUM: 136 mmol/L (ref 135–145)

## 2017-04-14 LAB — LIPID PANEL
CHOL/HDL RATIO: 4.8 ratio
CHOLESTEROL: 219 mg/dL — AB (ref 0–200)
HDL: 46 mg/dL (ref >40)
LDL Cholesterol: 122 mg/dL — ABNORMAL HIGH (ref 0–99)
TRIGLYCERIDES: 257 mg/dL — AB (ref <150)
VLDL: 51 mg/dL — ABNORMAL HIGH (ref 0–40)

## 2017-04-14 LAB — HEMOGLOBIN A1C
Hgb A1c MFr Bld: 5.3 % (ref 4.8–5.6)
MEAN PLASMA GLUCOSE: 105.41 mg/dL

## 2017-04-14 LAB — TSH: TSH: 2.376 u[IU]/mL (ref 0.350–4.500)

## 2017-04-14 LAB — LITHIUM LEVEL: Lithium Lvl: 0.34 mmol/L — ABNORMAL LOW (ref 0.60–1.20)

## 2017-04-14 NOTE — Telephone Encounter (Signed)
Called patient to discuss her labs.  Discussed with her to reach out to her primary medical doctor about her hypokalemia and as well as her abnormal renal function test.  She does have a history of creatinine being slightly elevated but it looks like it is chronic.  She is on lithium at this time discussed with her that if her kidney function continues to get worse she may have to be taken off of the lithium.  However she will reach out to her PMD and then we will discuss further treatment options after that.

## 2017-04-15 LAB — PROLACTIN: Prolactin: 12.5 ng/mL (ref 4.8–23.3)

## 2017-04-18 NOTE — Telephone Encounter (Signed)
Labwork was printed and mailed to patient.

## 2017-05-25 ENCOUNTER — Ambulatory Visit: Payer: Self-pay | Admitting: Psychiatry

## 2017-06-10 ENCOUNTER — Ambulatory Visit: Payer: Self-pay | Admitting: Psychiatry

## 2017-06-16 ENCOUNTER — Encounter: Payer: Self-pay | Admitting: Psychiatry

## 2017-06-16 ENCOUNTER — Ambulatory Visit: Payer: Self-pay | Admitting: Psychiatry

## 2017-06-16 VITALS — BP 127/76 | HR 91 | Temp 97.6°F | Wt 158.6 lb

## 2017-06-16 DIAGNOSIS — F1220 Cannabis dependence, uncomplicated: Secondary | ICD-10-CM

## 2017-06-16 DIAGNOSIS — F3162 Bipolar disorder, current episode mixed, moderate: Secondary | ICD-10-CM

## 2017-06-16 DIAGNOSIS — F603 Borderline personality disorder: Secondary | ICD-10-CM

## 2017-06-16 DIAGNOSIS — F17200 Nicotine dependence, unspecified, uncomplicated: Secondary | ICD-10-CM

## 2017-06-16 MED ORDER — DIVALPROEX SODIUM 250 MG PO DR TAB: 250.0000 mg | DELAYED_RELEASE_TABLET | Freq: Two times a day (BID) | ORAL | 0 refills | Status: DC

## 2017-06-16 NOTE — Patient Instructions (Signed)
Valproic Acid, Divalproex Sodium delayed or extended-release tablets What is this medicine? DIVALPROEX SODIUM (dye VAL pro ex SO dee um) is used to prevent seizures caused by some forms of epilepsy. It is also used to treat bipolar mania and to prevent migraine headaches. This medicine may be used for other purposes; ask your health care provider or pharmacist if you have questions. COMMON BRAND NAME(S): Depakote, Depakote ER What should I tell my health care provider before I take this medicine? They need to know if you have any of these conditions: -if you often drink alcohol -kidney disease -liver disease -low platelet counts -mitochondrial disease -suicidal thoughts, plans, or attempt; a previous suicide attempt by you or a family member -urea cycle disorder (UCD) -an unusual or allergic reaction to divalproex sodium, sodium valproate, valproic acid, other medicines, foods, dyes, or preservatives -pregnant or trying to get pregnant -breast-feeding How should I use this medicine? Take this medicine by mouth with a drink of water. Follow the directions on the prescription label. Do not cut, crush or chew this medicine. You can take it with or without food. If it upsets your stomach, take it with food. Take your medicine at regular intervals. Do not take it more often than directed. Do not stop taking except on your doctor's advice. A special MedGuide will be given to you by the pharmacist with each prescription and refill. Be sure to read this information carefully each time. Talk to your pediatrician regarding the use of this medicine in children. While this drug may be prescribed for children as young as 10 years for selected conditions, precautions do apply. Overdosage: If you think you have taken too much of this medicine contact a poison control center or emergency room at once. NOTE: This medicine is only for you. Do not share this medicine with others. What if I miss a dose? If you  miss a dose, take it as soon as you can. If it is almost time for your next dose, take only that dose. Do not take double or extra doses. What may interact with this medicine? Do not take this medicine with any of the following medications: -sodium phenylbutyrate This medicine may also interact with the following medications: -aspirin -certain antibiotics like ertapenem, imipenem, meropenem -certain medicines for depression, anxiety, or psychotic disturbances -certain medicines for seizures like carbamazepine, clonazepam, diazepam, ethosuximide, felbamate, lamotrigine, phenobarbital, phenytoin, primidone, rufinamide, topiramate -certain medicines that treat or prevent blood clots like warfarin -cholestyramine -female hormones, like estrogens and birth control pills, patches, or rings -propofol -rifampin -ritonavir -tolbutamide -zidovudine This list may not describe all possible interactions. Give your health care provider a list of all the medicines, herbs, non-prescription drugs, or dietary supplements you use. Also tell them if you smoke, drink alcohol, or use illegal drugs. Some items may interact with your medicine. What should I watch for while using this medicine? Tell your doctor or healthcare professional if your symptoms do not get better or they start to get worse. Wear a medical ID bracelet or chain, and carry a card that describes your disease and details of your medicine and dosage times. You may get drowsy, dizzy, or have blurred vision. Do not drive, use machinery, or do anything that needs mental alertness until you know how this medicine affects you. To reduce dizzy or fainting spells, do not sit or stand up quickly, especially if you are an older patient. Alcohol can increase drowsiness and dizziness. Avoid alcoholic drinks. This medicine can make you   more sensitive to the sun. Keep out of the sun. If you cannot avoid being in the sun, wear protective clothing and use  sunscreen. Do not use sun lamps or tanning beds/booths. Patients and their families should watch out for new or worsening depression or thoughts of suicide. Also watch out for sudden changes in feelings such as feeling anxious, agitated, panicky, irritable, hostile, aggressive, impulsive, severely restless, overly excited and hyperactive, or not being able to sleep. If this happens, especially at the beginning of treatment or after a change in dose, call your health care professional. Women should inform their doctor if they wish to become pregnant or think they might be pregnant. There is a potential for serious side effects to an unborn child. Talk to your health care professional or pharmacist for more information. Women who become pregnant while using this medicine may enroll in the [place] Antiepileptic Drug Pregnancy Registry by calling 1-888-233-2334. This registry collects information about the safety of antiepileptic drug use during pregnancy. What side effects may I notice from receiving this medicine? Side effects that you should report to your doctor or health care professional as soon as possible: -allergic reactions like skin rash, itching or hives, swelling of the face, lips, or tongue -changes in vision -redness, blistering, peeling or loosening of the skin, including inside the mouth -signs and symptoms of liver injury like dark yellow or brown urine; general ill feeling or flu-like symptoms; light-colored stools; loss of appetite; nausea; right upper belly pain; unusually weak or tired; yellowing of the eyes or skin -suicidal thoughts or other mood changes -unusual bleeding or bruising Side effects that usually do not require medical attention (report to your doctor or health care professional if they continue or are bothersome): -constipation -diarrhea -dizziness -hair loss -headache -loss of appetite -weight gain This list may not describe all possible side effects. Call  your doctor for medical advice about side effects. You may report side effects to FDA at 1-800-FDA-1088. Where should I keep my medicine? Keep out of reach of children. Store at room temperature between 15 and 30 degrees C (59 and 86 degrees F). Keep container tightly closed. Throw away any unused medicine after the expiration date. NOTE: This sheet is a summary. It may not cover all possible information. If you have questions about this medicine, talk to your doctor, pharmacist, or health care provider.  2018 Elsevier/Gold Standard (2015-05-01 07:11:40)  

## 2017-06-16 NOTE — Progress Notes (Signed)
[facility] MD OP Progress Note  06/16/2017 10:43 AM ODELLA APPELHANS  MRN:  [id number]  Chief Complaint: ' I am here for follow up." Chief Complaint    Follow-up; Medication Refill     HPI: Sheryl Perez is a 57 year old Caucasian female who has a history of bipolar disorder, borderline personality disorder, cannabis use disorder, tobacco use disorder, arthritis, presented to the clinic today for a follow-up visit.  Patient today reports that she is currently on potassium replacement for her low potassium level.  Patient continues to have elevated creatinine level as per recent BMP which was done.  Patient was advised about the risk of being on lithium  long-term.  Patient today reports she would like to get off the lithium since she is worried about the side effects and its effect on her kidneys.  Patient today reports she continues to take Seroquel at bedtime.  She reports that Seroquel has been helpful with sleep.  She reports she has been trying to cut down on her cannabis use as well as tobacco use.  Provided smoking cessation counseling.  Discussed adding Depakote instead of lithium.  She denies having any hepatic problems .  She denies ever being tried on the medication before.  Discussed with patient that she may have to follow-up in clinic every 2 weeks or so while we make that change.  She agrees with plan. Visit Diagnosis:    ICD-10-CM   1. Bipolar 1 disorder, mixed, moderate (HCC) F31.62   2. Cannabis use disorder, moderate, dependence (HCC) F12.20   3. Tobacco use disorder F17.200   4. Borderline personality disorder ([place]) F60.3     Past Psychiatric History: Reviewed past psychiatric history from my progress note on 04/12/2017.  Past trials of Risperdal-akathisia, Abilify-catatonia, Lamictal, lithium, Prozac, trazodone.  Past Medical History:  Past Medical History:  Diagnosis Date  . Back injury   . 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 Bipolar disorder ([place])   . Chest  pain, unspecified   . Chronic low back pain 10/23/2013  . Chronic UTI   . Depression   . Dysfunction of eustachian tube   . Enlargement of lymph nodes   . History of Bell's palsy Right  . Injury, other and unspecified, knee, leg, ankle, and foot   . MRSA (methicillin resistant staph aureus) culture positive 07-11-14   abscess  . Other specified disease of hair and hair follicles   . Rectal prolapse   . Unspecified symptom associated with female genital organs     Past Surgical History:  Procedure Laterality Date  . ABDOMINAL HYSTERECTOMY    . bladder tack  2010  . CHOLECYSTECTOMY  2002  . FOOT SURGERY Bilateral 2004   bunion  . GANGLION CYST EXCISION  2002  . INCISION AND DRAINAGE ABSCESS  04-16-15  . PARTIAL HYSTERECTOMY  2010   uterus removed    Family Psychiatric History: Reviewed family psychiatric history from my progress note on 04/12/2017  Family History:  Family History  Problem Relation Age of Onset  . Parkinson's disease Maternal Grandfather   . Diabetes Father   . Cancer Father        brain tumor  . Depression Father   . Diabetes Paternal Grandfather   . Colon polyps Mother   . Heart disease Maternal Grandmother   . Heart attack Maternal Grandmother   . Stroke Maternal Grandmother   . Depression Sister   . Alcohol abuse Brother   . Depression Brother   .  Breast cancer Paternal Aunt   . Breast cancer Cousin     Social History: Reviewed social history from my progress note on 04/12/2017 Social History   Socioeconomic History  . Marital status: Married    Spouse name: Not on file  . Number of children: 3  . Years of education: hs  . Highest education level: Some college, no degree  Occupational History  . Occupation: Designer, multimedia: [employer]  Social Needs  . Financial resource strain: Hard  . Food insecurity:    Worry: Never true    Inability: Never true  . Transportation needs:    Medical: No     Non-medical: No  Tobacco Use  . Smoking status: Current Every Day Smoker    Packs/day: 1.00    Years: 40.00    Pack years: 40.00    Types: Cigarettes  . Smokeless tobacco: Never Used  Substance and Sexual Activity  . Alcohol use: No    Alcohol/week: 0.6 oz    Types: 1 Glasses of wine per week    Comment: occasional  . Drug use: Yes    Types: Marijuana    Comment: last used 05-20-15  . Sexual activity: Yes    Birth control/protection: None  Lifestyle  . Physical activity:    Days per week: 0 days    Minutes per session: 0 min  . Stress: Very much  Relationships  . Social connections:    Talks on phone: More than three times a week    Gets together: More than three times a week    Attends religious service: More than 4 times per year    Active member of club or organization: No    Attends meetings of clubs or organizations: Never    Relationship status: Separated  Other Topics Concern  . Not on file  Social History Narrative   Patient lives at home with her husband [name]).   Patient works full time.   Education CNA    Right handed.   Caffeine Tea , Soda.    Allergies:  Allergies  Allergen Reactions  . Other Other (See Comments)    PT states that anything with the sides effects "may cause rash" she cannot take due to her psoriasis, it will make the patches on her hand so thick she can't use her hands.   . Sulfa Antibiotics Swelling    Diarrhea,dizziness, tongue swelling  . Abilify [Aripiprazole] Other (See Comments)  . Cephalosporins Rash  . Erythromycin Other (See Comments)    Abd. pain  . Lamictal [Lamotrigine] Rash    psorasis worsen  . Risperdal [Risperidone]     tremors    Metabolic Disorder Labs: Lab Results  Component Value Date   HGBA1C 5.3 04/14/2017   MPG 105.41 04/14/2017   Lab Results  Component Value Date   PROLACTIN 12.5 04/14/2017   Lab Results  Component Value Date   CHOL 219 (H) 04/14/2017   TRIG 257 (H) 04/14/2017   HDL 46  04/14/2017   CHOLHDL 4.8 04/14/2017   VLDL 51 (H) 04/14/2017   LDLCALC 122 (H) 04/14/2017   LDLCALC 98 05/30/2015   Lab Results  Component Value Date   TSH 2.376 04/14/2017   TSH 2.940 06/15/2016    Therapeutic Level Labs: Lab Results  Component Value Date   LITHIUM 0.34 (L) 04/14/2017   LITHIUM 0.3 (L) 06/15/2016   No results found for: VALPROATE No components found  for:  CBMZ  Current Medications: Current Outpatient Medications  Medication Sig Dispense Refill  . ALPRAZolam (XANAX) 0.25 MG tablet Take 0.25 mg by mouth at bedtime.  0  . cyclobenzaprine (FLEXERIL) 5 MG tablet Take 5 mg by mouth 2 (two) times daily at 10 AM and 5 PM.    . hydrOXYzine (VISTARIL) 25 MG capsule Take 1 capsule (25 mg total) by mouth 2 (two) times daily as needed. 60 capsule 4  . KLOR-CON M20 20 MEQ tablet Take 20 mEq by mouth 2 (two) times daily.  4  . megestrol (MEGACE) 400 MG/10ML suspension Take 10 mLs (400 mg total) by mouth daily. 240 mL 0  . propranolol (INDERAL) 20 MG tablet Take 20 mg by mouth 2 (two) times daily.  1  . QUEtiapine (SEROQUEL) 50 MG tablet Take 1 tablet (50 mg total) by mouth at bedtime. 30 tablet 1  . divalproex (DEPAKOTE) 250 MG DR tablet Take 1 tablet (250 mg total) by mouth 2 (two) times daily. 60 tablet 0   No current facility-administered medications for this visit.      Musculoskeletal: Strength & Muscle Tone: within normal limits Gait & Station: normal Patient leans: N/A  Psychiatric Specialty Exam: Review of Systems  Psychiatric/Behavioral: The patient is nervous/anxious.   All other systems reviewed and are negative.   Blood pressure 127/76, pulse 91, temperature 97.6 F (36.4 C), temperature source Oral, weight 158 lb 9.6 oz (71.9 kg), last menstrual period 02/09/2008.Body mass index is 30.97 kg/m.  General Appearance: Casual  Eye Contact:  Fair  Speech:  Clear and Coherent  Volume:  Normal  Mood:  Anxious  Affect:  Congruent  Thought Process:  Goal  Directed and Descriptions of Associations: Intact  Orientation:  Full (Time, Place, and Person)  Thought Content: Logical   Suicidal Thoughts:  No  Homicidal Thoughts:  No  Memory:  Immediate;   Fair Recent;   Fair Remote;   Fair  Judgement:  Fair  Insight:  Fair  Psychomotor Activity:  Normal  Concentration:  Concentration: Fair and Attention Span: Fair  Recall:  [employer] of Knowledge: Fair  Language: Fair  Akathisia:  No  Handed:  Right  AIMS (if indicated): denies rigidity, tremors  Assets:  Communication Skills Desire for Improvement Housing Social Support  ADL's:  Intact  Cognition: WNL  Sleep:  Fair   Screenings: AIMS     Office Visit from 05/03/2016 in [facility] Admission (Discharged) from 01/15/2015 in [facility] Total Score  0  0    AUDIT     Admission (Discharged) from 01/15/2015 in [facility]  Alcohol Use Disorder Identification Test Final Score (AUDIT)  1    GAD-7     Office Visit from 04/18/2015 in [facility]  Total GAD-7 Score  14    PHQ2-9     Office Visit from 04/18/2015 in [facility] Office Visit from 07/11/2014 in [facility]  PHQ-2 Total Score  4  0  PHQ-9 Total Score  14  -       Assessment and Plan: Oneal is a 57 year old Caucasian female who has a history of bipolar disorder, cannabis use disorder, medical problems including psoriasis, arthritis, presented to the clinic today for a follow-up visit.  Patient has renal function abnormalities, currently being managed by her PMD.  Discussed the effect of lithium on her kidney function.  Discussed adding Depakote instead of  lithium.  She agrees with plan.  Plan For bipolar disorder Discontinue lithium due to abnormal renal function. Start Depakote DR 250 mg p.o. twice daily.  Discussed with her to get Depakote levels done in 5-7 days from now.  Provided her with lab  slip. Continue Seroquel 50 mg p.o. nightly  For anxiety symptoms Continue propranolol 10 mg p.o. twice daily  For insomnia Continue Seroquel 50 mg p.o. nightly  For tobacco use disorder Provided smoking cessation counseling, she is not willing to quit.  For cannabis use disorder Provided substance abuse counseling-she reports she uses it very rarely.  For Borderline personality disorder  Monitor her closely.  Follow-up in clinic in 2 weeks or sooner if needed.  More than 50 % of the time was spent for psychoeducation and supportive psychotherapy and care coordination.  This note was generated in part or whole with voice recognition software. Voice recognition is usually quite accurate but there are transcription errors that can and very often do occur. I apologize for any typographical errors that were not detected and corrected.        Ursula Alert, MD 06/17/2017, 8:42 AM

## 2017-06-17 ENCOUNTER — Encounter: Payer: Self-pay | Admitting: Psychiatry

## 2017-06-27 ENCOUNTER — Other Ambulatory Visit
Admission: RE | Admit: 2017-06-27 | Discharge: 2017-06-27 | Disposition: A | Discharge status: Home / Self Care | Payer: Self-pay | Source: Ambulatory Visit | Attending: Psychiatry | Admitting: Psychiatry

## 2017-06-27 DIAGNOSIS — F319 Bipolar disorder, unspecified: Secondary | ICD-10-CM | POA: Insufficient documentation

## 2017-06-27 LAB — VALPROIC ACID LEVEL: VALPROIC ACID LVL: 36 ug/mL — AB (ref 50.0–100.0)

## 2017-06-30 ENCOUNTER — Encounter: Payer: Self-pay | Admitting: Psychiatry

## 2017-06-30 ENCOUNTER — Ambulatory Visit: Payer: Self-pay | Admitting: Psychiatry

## 2017-06-30 ENCOUNTER — Other Ambulatory Visit: Payer: Self-pay

## 2017-06-30 VITALS — BP 124/76 | HR 106 | Temp 98.3°F | Wt 159.2 lb

## 2017-06-30 DIAGNOSIS — F3162 Bipolar disorder, current episode mixed, moderate: Secondary | ICD-10-CM

## 2017-06-30 DIAGNOSIS — F17200 Nicotine dependence, unspecified, uncomplicated: Secondary | ICD-10-CM

## 2017-06-30 DIAGNOSIS — F603 Borderline personality disorder: Secondary | ICD-10-CM

## 2017-06-30 DIAGNOSIS — F1220 Cannabis dependence, uncomplicated: Secondary | ICD-10-CM

## 2017-06-30 MED ORDER — PROPRANOLOL HCL 20 MG PO TABS: 20.0000 mg | ORAL_TABLET | Freq: Two times a day (BID) | ORAL | 1 refills | Status: DC

## 2017-06-30 MED ORDER — DIVALPROEX SODIUM 250 MG PO DR TAB: 250.0000 mg | DELAYED_RELEASE_TABLET | Freq: Two times a day (BID) | ORAL | 0 refills | Status: DC

## 2017-06-30 MED ORDER — QUETIAPINE FUMARATE 50 MG PO TABS: 50.0000 mg | ORAL_TABLET | Freq: Every day | ORAL | 1 refills | Status: DC

## 2017-06-30 NOTE — Progress Notes (Signed)
[facility] MD OP Progress Note  06/30/2017 12:15 PM KIANDRIA CLUM  MRN:  [id number]  Chief Complaint: ' I am here for follow up." Chief Complaint    Follow-up; Medication Refill     HPI: Sheryl Perez is a 57 year old Caucasian female who has a history of bipolar disorder, borderline personality disorder, cannabis use disorder, tobacco use disorder, arthritis, presented to the clinic today for a follow-up visit.  Patient today reports her disability application got denied.  She reports she hence is currently looking for a job.  She used to work as a Quarry manager in the past.  She however reports she does not want to go into that again since she gets emotionally attached to the patient's and that affects her emotional well-being.  She is looking for Psychologist, educational job.  She reports however she is improving on the current medications.  She reports she is tolerating the Depakote well.  She reports she is not as anxious or labile as she used to be before.  She also reports her manic phase is not as high as it used to be before on the Depakote.  She had Depakote level done and it came back subtherapeutic.  This was discussed with patient.  She however reports she would like to stay on the same dose for now.  She will reach out to writer if she has mood lability or anxiety symptoms.  She continues to take Seroquel 25-50 mg at bedtime.  She reports sleep is good.  She has been trying to cut down on her cannabis use as well as tobacco use.  She denies any suicidality.  She denies any perceptual disturbances. Visit Diagnosis:    ICD-10-CM   1. Bipolar 1 disorder, mixed, moderate (HCC) F31.62 QUEtiapine (SEROQUEL) 50 MG tablet  2. Cannabis use disorder, moderate, dependence (HCC) F12.20   3. Tobacco use disorder F17.200   4. Borderline personality disorder ([place]) F60.3     Past Psychiatric History: Reviewed past psychiatric history from my progress note on 04/12/2017.  Past trials of Risperdal-akathisia, Abilify-catatonia,  Lamictal, lithium, Prozac, trazodone.  Past Medical History:  Past Medical History:  Diagnosis Date  . Back injury   . 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 Bipolar disorder ([place])   . Chest pain, unspecified   . Chronic low back pain 10/23/2013  . Chronic UTI   . Depression   . Dysfunction of eustachian tube   . Enlargement of lymph nodes   . History of Bell's palsy Right  . Injury, other and unspecified, knee, leg, ankle, and foot   . MRSA (methicillin resistant staph aureus) culture positive 07-11-14   abscess  . Other specified disease of hair and hair follicles   . Rectal prolapse   . Unspecified symptom associated with female genital organs     Past Surgical History:  Procedure Laterality Date  . ABDOMINAL HYSTERECTOMY    . bladder tack  2010  . CHOLECYSTECTOMY  2002  . FOOT SURGERY Bilateral 2004   bunion  . GANGLION CYST EXCISION  2002  . INCISION AND DRAINAGE ABSCESS  04-16-15  . PARTIAL HYSTERECTOMY  2010   uterus removed    Family Psychiatric History: Reviewed family psychiatric history from my progress note on 04/12/2017.  Family History:  Family History  Problem Relation Age of Onset  . Parkinson's disease Maternal Grandfather   . Diabetes Father   . Cancer Father        brain tumor  . Depression  Father   . Diabetes Paternal Grandfather   . Colon polyps Mother   . Heart disease Maternal Grandmother   . Heart attack Maternal Grandmother   . Stroke Maternal Grandmother   . Depression Sister   . Alcohol abuse Brother   . Depression Brother   . Breast cancer Paternal Aunt   . Breast cancer Cousin     Social History: Reviewed social history from my progress note on 04/12/2017 Social History   Socioeconomic History  . Marital status: Married    Spouse name: Not on file  . Number of children: 3  . Years of education: hs  . Highest education level: Some college, no degree  Occupational History  . Occupation: Designer, multimedia: [employer]: [employer]  Social Needs  . Financial resource strain: Hard  . Food insecurity:    Worry: Never true    Inability: Never true  . Transportation needs:    Medical: No    Non-medical: No  Tobacco Use  . Smoking status: Current Every Day Smoker    Packs/day: 1.00    Years: 40.00    Pack years: 40.00    Types: Cigarettes  . Smokeless tobacco: Never Used  Substance and Sexual Activity  . Alcohol use: No    Alcohol/week: 0.6 oz    Types: 1 Glasses of wine per week    Comment: occasional  . Drug use: Yes    Types: Marijuana    Comment: last used 05-20-15  . Sexual activity: Yes    Birth control/protection: None  Lifestyle  . Physical activity:    Days per week: 0 days    Minutes per session: 0 min  . Stress: Very much  Relationships  . Social connections:    Talks on phone: More than three times a week    Gets together: More than three times a week    Attends religious service: More than 4 times per year    Active member of club or organization: No    Attends meetings of clubs or organizations: Never    Relationship status: Separated  Other Topics Concern  . Not on file  Social History Narrative   Patient lives at home with her husband [name]).   Patient works full time.   Education CNA    Right handed.   Caffeine Tea , Soda.    Allergies:  Allergies  Allergen Reactions  . Other Other (See Comments)    PT states that anything with the sides effects "may cause rash" she cannot take due to her psoriasis, it will make the patches on her hand so thick she can't use her hands.   . Sulfa Antibiotics Swelling    Diarrhea,dizziness, tongue swelling  . Abilify [Aripiprazole] Other (See Comments)  . Cephalosporins Rash  . Erythromycin Other (See Comments)    Abd. pain  . Lamictal [Lamotrigine] Rash    psorasis worsen  . Risperdal [Risperidone]     tremors    Metabolic Disorder Labs: Lab Results  Component Value Date    HGBA1C 5.3 04/14/2017   MPG 105.41 04/14/2017   Lab Results  Component Value Date   PROLACTIN 12.5 04/14/2017   Lab Results  Component Value Date   CHOL 219 (H) 04/14/2017   TRIG 257 (H) 04/14/2017   HDL 46 04/14/2017   CHOLHDL 4.8 04/14/2017   VLDL 51 (H) 04/14/2017   LDLCALC 122 (H) 04/14/2017   LDLCALC 98 05/30/2015  Lab Results  Component Value Date   TSH 2.376 04/14/2017   TSH 2.940 06/15/2016    Therapeutic Level Labs: Lab Results  Component Value Date   LITHIUM 0.34 (L) 04/14/2017   LITHIUM 0.3 (L) 06/15/2016   Lab Results  Component Value Date   VALPROATE 36 (L) 06/27/2017   No components found for:  CBMZ  Current Medications: Current Outpatient Medications  Medication Sig Dispense Refill  . ALPRAZolam (XANAX) 0.25 MG tablet Take 0.25 mg by mouth at bedtime.  0  . cyclobenzaprine (FLEXERIL) 5 MG tablet Take 5 mg by mouth 2 (two) times daily at 10 AM and 5 PM.    . divalproex (DEPAKOTE) 250 MG DR tablet Take 1 tablet (250 mg total) by mouth 2 (two) times daily. 180 tablet 0  . hydrOXYzine (VISTARIL) 25 MG capsule Take 1 capsule (25 mg total) by mouth 2 (two) times daily as needed. 60 capsule 4  . KLOR-CON M20 20 MEQ tablet Take 20 mEq by mouth 2 (two) times daily.  4  . megestrol (MEGACE) 400 MG/10ML suspension Take 10 mLs (400 mg total) by mouth daily. 240 mL 0  . propranolol (INDERAL) 20 MG tablet Take 1 tablet (20 mg total) by mouth 2 (two) times daily. 180 tablet 1  . QUEtiapine (SEROQUEL) 50 MG tablet Take 1 tablet (50 mg total) by mouth at bedtime. 90 tablet 1   No current facility-administered medications for this visit.      Musculoskeletal: Strength & Muscle Tone: within normal limits Gait & Station: normal Patient leans: N/A  Psychiatric Specialty Exam: Review of Systems  Psychiatric/Behavioral: The patient is nervous/anxious.   All other systems reviewed and are negative.   Blood pressure 124/76, pulse (!) 106, temperature 98.3 F (36.8  C), temperature source Oral, weight 159 lb 3.2 oz (72.2 kg), last menstrual period 02/09/2008.Body mass index is 31.09 kg/m.  General Appearance: Casual  Eye Contact:  Fair  Speech:  Normal Rate  Volume:  Normal  Mood:  Euthymic  Affect:  Congruent  Thought Process:  Goal Directed and Descriptions of Associations: Circumstantial  Orientation:  Full (Time, Place, and Person)  Thought Content: Logical   Suicidal Thoughts:  No  Homicidal Thoughts:  No  Memory:  Immediate;   Fair Recent;   Fair Remote;   Fair  Judgement:  Fair  Insight:  Fair  Psychomotor Activity:  Normal  Concentration:  Concentration: Fair and Attention Span: Fair  Recall:  [employer] of Knowledge: Fair  Language: Fair  Akathisia:  No  Handed:  Right  AIMS (if indicated): 0  Assets:  Communication Skills Desire for Improvement Social Support  ADL's:  Intact  Cognition: WNL  Sleep:  Fair   Screenings: AIMS     Office Visit from 05/03/2016 in [facility] Admission (Discharged) from 01/15/2015 in [facility] Total Score  0  0    AUDIT     Admission (Discharged) from 01/15/2015 in [facility]  Alcohol Use Disorder Identification Test Final Score (AUDIT)  1    GAD-7     Office Visit from 04/18/2015 in [facility]  Total GAD-7 Score  14    PHQ2-9     Office Visit from 04/18/2015 in [facility] Office Visit from 07/11/2014 in [facility]  PHQ-2 Total Score  4  0  PHQ-9 Total Score  14  -       Assessment  and Plan: Jyasia is a 57 year old Caucasian female who has a history of bipolar disorder, cannabis use disorder, medical problems including psoriasis, arthritis, presented to the clinic today for a follow-up visit.  Patient is currently doing well on the Depakote.  She had Depakote level done.  Patient continues to want to stay on the current dosage.  Plan as noted  below.  Plan Bipolar disorder Continue Depakote ER 250 mg p.o. twice daily.  I have reviewed Depakote level done recently on 06/27/17-36-subtherapeutic.  This was discussed with patient.  Patient however reports she wants to stay on this dose for now. We will discuss her low potassium level.  She will reach out to her PMD for the same. She will continue Seroquel 50 mg p.o. nightly  For anxiety symptoms Continue propranolol 10 mg p.o. twice daily  For insomnia Continue Seroquel 50 mg p.o. nightly  For tobacco use disorder Provided smoking cessation counseling, she is trying to quit.  For cannabis use disorder Provided substance abuse counseling-patient reports she is trying to quit.  Borderline personality disorder We will continue to monitor her closely.  Follow-up in clinic in 1 month or sooner if needed.  More than 50 % of the time was spent for psychoeducation and supportive psychotherapy and care coordination.  This note was generated in part or whole with voice recognition software. Voice recognition is usually quite accurate but there are transcription errors that can and very often do occur. I apologize for any typographical errors that were not detected and corrected.         Ursula Alert, MD 07/01/2017, 11:57 AM

## 2017-07-01 ENCOUNTER — Encounter: Payer: Self-pay | Admitting: Psychiatry

## 2017-07-05 ENCOUNTER — Telehealth: Payer: Self-pay

## 2017-07-05 MED ORDER — DIVALPROEX SODIUM 250 MG PO DR TAB: 250.0000 mg | DELAYED_RELEASE_TABLET | Freq: Three times a day (TID) | ORAL | 0 refills | Status: DC

## 2017-07-05 NOTE — Telephone Encounter (Signed)
Will increase her depakote to 250 mg tid . Please ask patient to come and pick up lab order for depakote level from front desk ASAP. I have sent her new script to pharmacy

## 2017-07-05 NOTE — Telephone Encounter (Signed)
pt called states you had put her on depakote 250mg  and told her that is she still depressed to let you know.  well she states she still depressed. wanted to know if you was going to changed medication or increase.

## 2017-08-30 ENCOUNTER — Ambulatory Visit: Payer: Self-pay | Admitting: Psychiatry

## 2017-09-13 ENCOUNTER — Ambulatory Visit: Payer: Self-pay | Admitting: Psychiatry

## 2017-09-13 ENCOUNTER — Encounter: Payer: Self-pay | Admitting: Psychiatry

## 2017-09-13 ENCOUNTER — Other Ambulatory Visit: Payer: Self-pay

## 2017-09-13 VITALS — BP 110/69 | HR 86 | Temp 98.4°F | Wt 156.8 lb

## 2017-09-13 DIAGNOSIS — F1220 Cannabis dependence, uncomplicated: Secondary | ICD-10-CM

## 2017-09-13 DIAGNOSIS — F3162 Bipolar disorder, current episode mixed, moderate: Secondary | ICD-10-CM

## 2017-09-13 DIAGNOSIS — F17200 Nicotine dependence, unspecified, uncomplicated: Secondary | ICD-10-CM

## 2017-09-13 DIAGNOSIS — F603 Borderline personality disorder: Secondary | ICD-10-CM

## 2017-09-13 MED ORDER — PROPRANOLOL HCL 20 MG PO TABS: 20.0000 mg | ORAL_TABLET | Freq: Three times a day (TID) | ORAL | 1 refills | Status: DC

## 2017-09-13 MED ORDER — DIVALPROEX SODIUM 250 MG PO DR TAB: 250.0000 mg | DELAYED_RELEASE_TABLET | Freq: Three times a day (TID) | ORAL | 1 refills | Status: DC

## 2017-09-13 NOTE — Progress Notes (Signed)
[facility] MD OP Progress Note  09/13/2017 5:22 PM Sheryl Perez  MRN:  [id number]  Chief Complaint: ' I am here for follow up." Chief Complaint    Follow-up; Medication Refill     HPI: Sheryl Perez is a 57 year old Caucasian female who has a history of bipolar disorder, borderline personality disorder, cannabis use disorder in early remission, tobacco use disorder, arthritis, presented to the clinic today for a follow-up visit.  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  She reports she works 5 days a week and that has been extremely helpful.  She enjoys her work and is able to focus.  She does report some anxiety symptoms and wonders whether her propranolol can be increased.  She reports the propranolol is helpful when she takes it.  Patient otherwise tolerating Depakote well.  She is on Depakote 250 mg 3 times a day which was increased sometime in May.  Patient was advised at that time to get Depakote level read on.  She however has been unable to do so.  This was discussed with patient.  She reports she will get it done with her PCP soon.  Patient reports sleep is good on the Seroquel which she takes as needed.  Patient denies any suicidality.  Patient denies any perceptual disturbances.  Patient reports she has been cutting down on smoking cigarettes.  She also has been staying away from cannabis.  Visit Diagnosis:    ICD-10-CM   1. Bipolar 1 disorder, mixed, moderate (HCC) F31.62 divalproex (DEPAKOTE) 250 MG DR tablet    propranolol (INDERAL) 20 MG tablet  2. Tobacco use disorder F17.200   3. Borderline personality disorder ([place]) F60.3   4. Cannabis use disorder, moderate, dependence (HCC) F12.20    in early remission    Past Psychiatric History: Past psychiatric history from my progress note on 04/12/2017.  Past trials of Risperdal -akathisia, Abilify-catatonia, Lamictal, lithium, Prozac, trazodone.  Past Medical History:  Past Medical History:  Diagnosis Date  .  Back injury   . 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 Bipolar disorder ([place])   . Chest pain, unspecified   . Chronic low back pain 10/23/2013  . Chronic UTI   . Depression   . Dysfunction of eustachian tube   . Enlargement of lymph nodes   . History of Bell's palsy Right  . Injury, other and unspecified, knee, leg, ankle, and foot   . MRSA (methicillin resistant staph aureus) culture positive 07-11-14   abscess  . Other specified disease of hair and hair follicles   . Rectal prolapse   . Unspecified symptom associated with female genital organs     Past Surgical History:  Procedure Laterality Date  . ABDOMINAL HYSTERECTOMY    . bladder tack  2010  . CHOLECYSTECTOMY  2002  . FOOT SURGERY Bilateral 2004   bunion  . GANGLION CYST EXCISION  2002  . INCISION AND DRAINAGE ABSCESS  04-16-15  . PARTIAL HYSTERECTOMY  2010   uterus removed    Family Psychiatric History: Reviewed family psychiatric history from my progress note on 04/12/2017.  Family History:  Family History  Problem Relation Age of Onset  . Parkinson's disease Maternal Grandfather   . Diabetes Father   . Cancer Father        brain tumor  . Depression Father   . Diabetes Paternal Grandfather   . Colon polyps Mother   . Heart disease Maternal Grandmother   .  Heart attack Maternal Grandmother   . Stroke Maternal Grandmother   . Depression Sister   . Alcohol abuse Brother   . Depression Brother   . Breast cancer Paternal Aunt   . Breast cancer Cousin   Substance abuse history: Denies   Social History: Reviewed social history from my progress note on 04/12/2017. Social History   Socioeconomic History  . Marital status: Married    Spouse name: Not on file  . Number of children: 3  . Years of education: hs  . Highest education level: Some college, no degree  Occupational History  . Occupation: Designer, multimedia: [employer]  Social Needs  .  Financial resource strain: Hard  . Food insecurity:    Worry: Never true    Inability: Never true  . Transportation needs:    Medical: No    Non-medical: No  Tobacco Use  . Smoking status: Current Every Day Smoker    Packs/day: 1.00    Years: 40.00    Pack years: 40.00    Types: Cigarettes  . Smokeless tobacco: Never Used  Substance and Sexual Activity  . Alcohol use: No    Alcohol/week: 0.6 oz    Types: 1 Glasses of wine per week    Comment: occasional  . Drug use: Yes    Types: Marijuana    Comment: last used 05-20-15  . Sexual activity: Yes    Birth control/protection: None  Lifestyle  . Physical activity:    Days per week: 0 days    Minutes per session: 0 min  . Stress: Very much  Relationships  . Social connections:    Talks on phone: More than three times a week    Gets together: More than three times a week    Attends religious service: More than 4 times per year    Active member of club or organization: No    Attends meetings of clubs or organizations: Never    Relationship status: Separated  Other Topics Concern  . Not on file  Social History Narrative   Patient lives at home with her husband Sheryl Perez).   Patient works full time.   Education CNA    Right handed.   Caffeine Tea , Soda.    Allergies:  Allergies  Allergen Reactions  . Other Other (See Comments)    PT states that anything with the sides effects "may cause rash" she cannot take due to her psoriasis, it will make the patches on her hand so thick she can't use her hands.   . Sulfa Antibiotics Swelling    Diarrhea,dizziness, tongue swelling  . Abilify [Aripiprazole] Other (See Comments)  . Cephalosporins Rash  . Erythromycin Other (See Comments)    Abd. pain  . Lamictal [Lamotrigine] Rash    psorasis worsen  . Risperdal [Risperidone]     tremors    Metabolic Disorder Labs: Lab Results  Component Value Date   HGBA1C 5.3 04/14/2017   MPG 105.41 04/14/2017   Lab Results  Component  Value Date   PROLACTIN 12.5 04/14/2017   Lab Results  Component Value Date   CHOL 219 (H) 04/14/2017   TRIG 257 (H) 04/14/2017   HDL 46 04/14/2017   CHOLHDL 4.8 04/14/2017   VLDL 51 (H) 04/14/2017   LDLCALC 122 (H) 04/14/2017   LDLCALC 98 05/30/2015   Lab Results  Component Value Date   TSH 2.376 04/14/2017   TSH 2.940 06/15/2016  Therapeutic Level Labs: Lab Results  Component Value Date   LITHIUM 0.34 (L) 04/14/2017   LITHIUM 0.3 (L) 06/15/2016   Lab Results  Component Value Date   VALPROATE 36 (L) 06/27/2017   No components found for:  CBMZ  Current Medications: Current Outpatient Medications  Medication Sig Dispense Refill  . ALPRAZolam (XANAX) 0.25 MG tablet Take 0.25 mg by mouth at bedtime.  0  . cyclobenzaprine (FLEXERIL) 5 MG tablet Take 5 mg by mouth 2 (two) times daily at 10 AM and 5 PM.    . divalproex (DEPAKOTE) 250 MG DR tablet Take 1 tablet (250 mg total) by mouth 3 (three) times daily. 270 tablet 1  . KLOR-CON M20 20 MEQ tablet Take 20 mEq by mouth 2 (two) times daily.  4  . megestrol (MEGACE) 400 MG/10ML suspension Take 10 mLs (400 mg total) by mouth daily. 240 mL 0  . propranolol (INDERAL) 20 MG tablet Take 1 tablet (20 mg total) by mouth 3 (three) times daily. 270 tablet 1  . QUEtiapine (SEROQUEL) 50 MG tablet Take 1 tablet (50 mg total) by mouth at bedtime. 90 tablet 1   No current facility-administered medications for this visit.      Musculoskeletal: Strength & Muscle Tone: within normal limits Gait & Station: normal Patient leans: N/A  Psychiatric Specialty Exam: Review of Systems  Psychiatric/Behavioral: The patient is nervous/anxious.   All other systems reviewed and are negative.   Blood pressure 110/69, pulse 86, temperature 98.4 F (36.9 C), temperature source Oral, weight 156 lb 12.8 oz (71.1 kg), last menstrual period 02/09/2008.Body mass index is 30.62 kg/m.  General Appearance: Casual  Eye Contact:  Fair  Speech:  Clear and  Coherent  Volume:  Normal  Mood:  Anxious  Affect:  Congruent  Thought Process:  Goal Directed and Descriptions of Associations: Intact  Orientation:  Full (Time, Place, and Person)  Thought Content: Logical   Suicidal Thoughts:  No  Homicidal Thoughts:  No  Memory:  Immediate;   Fair Recent;   Fair Remote;   Fair  Judgement:  Fair  Insight:  Fair  Psychomotor Activity:  Normal  Concentration:  Concentration: Fair and Attention Span: Fair  Recall:  [employer] of Knowledge: Fair  Language: Fair  Akathisia:  No  Handed:  Right  AIMS (if indicated): denies tremors, rigidity,stiffness  Assets:  Communication Skills Desire for Improvement Social Support  ADL's:  Intact  Cognition: WNL  Sleep:  Fair   Screenings: AIMS     Office Visit from 05/03/2016 in [facility] Admission (Discharged) from 01/15/2015 in [facility] Total Score  0  0    AUDIT     Admission (Discharged) from 01/15/2015 in [facility]  Alcohol Use Disorder Identification Test Final Score (AUDIT)  1    GAD-7     Office Visit from 04/18/2015 in [facility]  Total GAD-7 Score  14    PHQ2-9     Office Visit from 04/18/2015 in [facility] Office Visit from 07/11/2014 in [facility]  PHQ-2 Total Score  4  0  PHQ-9 Total Score  14  -       Assessment and Plan: Izza is a 57 year old Caucasian female who has a history of bipolar disorder, cannabis use disorder, medical problems including psoriasis, arthritis, presented to the clinic today for a follow-up visit.  Patient does have some continued anxiety symptoms however  otherwise is doing better.  Will make the following medication changes.  Plan For bipolar disorder Continue Depakote ER 250 mg p.o. 3 times daily.  Discussed with patient to get Depakote level repeated.  She reports she will get it done from her PCP. Continue Seroquel 50 mg  p.o. nightly.  She takes it as needed.  For anxiety symptoms Increase propranolol to 10 mg p.o. 3 times daily.  For insomnia Seroquel 50 mg p.o. nightly as needed  For cannabis use disorder in early remission Provided counseling.  Tobacco use disorder She reports she is trying to cut down.  Follow-up in clinic in 2 months.  More than 50 % of the time was spent for psychoeducation and supportive psychotherapy and care coordination.  This note was generated in part or whole with voice recognition software. Voice recognition is usually quite accurate but there are transcription errors that can and very often do occur. I apologize for any typographical errors that were not detected and corrected.       Ursula Alert, MD 09/13/2017, 5:22 PM

## 2017-11-14 ENCOUNTER — Ambulatory Visit: Payer: Self-pay | Admitting: Psychiatry

## 2017-11-28 ENCOUNTER — Ambulatory Visit: Payer: Self-pay | Admitting: Psychiatry

## 2018-03-26 ENCOUNTER — Other Ambulatory Visit: Payer: Self-pay | Admitting: Psychiatry

## 2018-03-26 DIAGNOSIS — F3162 Bipolar disorder, current episode mixed, moderate: Secondary | ICD-10-CM

## 2018-03-27 MED ORDER — PROPRANOLOL HCL 20 MG PO TABS: 20.0000 mg | ORAL_TABLET | Freq: Three times a day (TID) | ORAL | 0 refills | Status: DC

## 2018-03-27 NOTE — Telephone Encounter (Signed)
Sent refills to pharmacy for propranolol for 30 days. Pt needs to be seen in clinic prior to further refills

## 2018-04-26 ENCOUNTER — Other Ambulatory Visit: Payer: Self-pay | Admitting: Psychiatry

## 2018-04-26 DIAGNOSIS — F3162 Bipolar disorder, current episode mixed, moderate: Secondary | ICD-10-CM

## 2018-05-02 ENCOUNTER — Telehealth: Payer: Self-pay

## 2018-05-02 NOTE — Telephone Encounter (Signed)
pt states she needs a labwork slip to be left at front desk for her to get her labs done.

## 2018-05-03 NOTE — Telephone Encounter (Signed)
Hi jess , please call patient and let her know to come and collect labs slip from Lea at front desk when she can. Thanks

## 2018-05-10 ENCOUNTER — Other Ambulatory Visit: Admission: RE | Admit: 2018-05-10 | Payer: Self-pay | Source: Ambulatory Visit | Admitting: Psychiatry

## 2018-05-11 ENCOUNTER — Other Ambulatory Visit: Payer: Self-pay

## 2018-05-11 ENCOUNTER — Other Ambulatory Visit
Admission: RE | Admit: 2018-05-11 | Discharge: 2018-05-11 | Disposition: A | Discharge status: Home / Self Care | Payer: Self-pay | Source: Ambulatory Visit | Attending: Psychiatry | Admitting: Psychiatry

## 2018-05-11 ENCOUNTER — Ambulatory Visit (INDEPENDENT_AMBULATORY_CARE_PROVIDER_SITE_OTHER): Payer: Self-pay | Admitting: Psychiatry

## 2018-05-11 ENCOUNTER — Encounter: Payer: Self-pay | Admitting: Psychiatry

## 2018-05-11 ENCOUNTER — Telehealth: Payer: Self-pay

## 2018-05-11 DIAGNOSIS — F603 Borderline personality disorder: Secondary | ICD-10-CM

## 2018-05-11 DIAGNOSIS — F3162 Bipolar disorder, current episode mixed, moderate: Secondary | ICD-10-CM

## 2018-05-11 DIAGNOSIS — F17200 Nicotine dependence, unspecified, uncomplicated: Secondary | ICD-10-CM

## 2018-05-11 DIAGNOSIS — F1220 Cannabis dependence, uncomplicated: Secondary | ICD-10-CM

## 2018-05-11 LAB — COMPREHENSIVE METABOLIC PANEL
ALT: 14 U/L (ref 0–44)
AST: 18 U/L (ref 15–41)
Albumin: 3.9 g/dL (ref 3.5–5.0)
Alkaline Phosphatase: 73 U/L (ref 38–126)
Anion gap: 8 (ref 5–15)
BUN: 13 mg/dL (ref 6–20)
CO2: 27 mmol/L (ref 22–32)
Calcium: 9.2 mg/dL (ref 8.9–10.3)
Chloride: 104 mmol/L (ref 98–111)
Creatinine, Ser: 0.87 mg/dL (ref 0.44–1.00)
GFR calc Af Amer: >60 mL/min (ref >60)
GFR calc non Af Amer: >60 mL/min (ref >60)
Glucose, Bld: 111 mg/dL — ABNORMAL HIGH (ref 70–99)
Potassium: 4.4 mmol/L (ref 3.5–5.1)
Sodium: 139 mmol/L (ref 135–145)
Total Bilirubin: 0.6 mg/dL (ref 0.3–1.2)
Total Protein: 7.7 g/dL (ref 6.5–8.1)

## 2018-05-11 LAB — VALPROIC ACID LEVEL: Valproic Acid Lvl: 15 ug/mL — ABNORMAL LOW (ref 50.0–100.0)

## 2018-05-11 MED ORDER — QUETIAPINE FUMARATE 50 MG PO TABS: 50.0000 mg | ORAL_TABLET | Freq: Every day | ORAL | 0 refills | Status: DC

## 2018-05-11 MED ORDER — PROPRANOLOL HCL 20 MG PO TABS: 20.0000 mg | ORAL_TABLET | Freq: Three times a day (TID) | ORAL | 0 refills | Status: DC

## 2018-05-11 NOTE — Progress Notes (Signed)
Virtual Visit via Telephone Note  Sheryl connected with Sheryl Perez on 05/11/18 at  3:45 PM EDT by telephone and verified that Sheryl am speaking with the correct person using two identifiers.   Sheryl discussed the limitations, risks, security and privacy concerns of performing an evaluation and management service by telephone and the availability of in person appointments. Sheryl also discussed with the patient that there may be a patient responsible charge related to this service. The patient expressed understanding and agreed to proceed.   Sheryl discussed the assessment and treatment plan with the patient. The patient was provided an opportunity to ask questions and all were answered. The patient agreed with the plan and demonstrated an understanding of the instructions.   The patient was advised to call back or seek an in-person evaluation if the symptoms worsen or if the condition fails to improve as anticipated.  Sheryl provided 15 minutes of non-face-to-face time during this encounter.   Ursula Alert, MD  [facility] MD OP Progress Note  05/11/2018 4:21 PM ESTI Perez  MRN:  [id number]  Chief Complaint:  Chief Complaint    Follow-up     HPI: Sheryl Perez is a 58 year old Caucasian female who has a history of bipolar disorder, borderline personality disorder, cannabis use disorder in early remission, tobacco use disorder, arthritis was evaluated by telemedicine.  Patient today reports she could not come for her follow-up visits as recommended previously since she had lost her job.  She currently has a job again and she reports she really likes it.  She hence wants to continue to follow-up in clinic at this time.  She reports she has been taking her medications as prescribed.  She is compliant on her Depakote.  She reports she had Depakote labs done this morning.  Discussed Depakote level with her.  Depakote levels this morning-05/11/2018-15-subtherapeutic.  This was discussed with patient.  She however reports she  may have skipped a dose yesterday which could have caused it.  She reports she wants to stay on the current dosage which is helpful and does not want any dose readjustment at this time.   Patient reports sleep is good.  She continues to take Seroquel.  She denies any perceptual disturbances.  She continues to smoke cigarettes.  She is trying to cut down.  She does not want the Chantix yet.  She will let writer know when she is ready.  Patient denies any suicidality, homicidality or perceptual disturbances. Visit Diagnosis:    ICD-10-CM   1. Bipolar 1 disorder, mixed, moderate (HCC) F31.62 QUEtiapine (SEROQUEL) 50 MG tablet    propranolol (INDERAL) 20 MG tablet  2. Tobacco use disorder F17.200   3. Borderline personality disorder ([place]) F60.3   4. Cannabis use disorder, moderate, dependence (HCC) F12.20     Past Psychiatric History: Reviewed past psychiatric history from my progress note on 04/12/2017.  Past trials of Risperdal-akathisia, Abilify-catatonia, Lamictal, lithium, Prozac, trazodone  Past Medical History:  Past Medical History:  Diagnosis Date  . Back injury   . 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 Bipolar disorder ([place])   . Chest pain, unspecified   . Chronic low back pain 10/23/2013  . Chronic UTI   . Depression   . Dysfunction of eustachian tube   . Enlargement of lymph nodes   . History of Bell's palsy Right  . Injury, other and unspecified, knee, leg, ankle, and foot   . MRSA (methicillin resistant staph aureus) culture positive  07-11-14   abscess  . Other specified disease of hair and hair follicles   . Rectal prolapse   . Unspecified symptom associated with female genital organs     Past Surgical History:  Procedure Laterality Date  . ABDOMINAL HYSTERECTOMY    . bladder tack  2010  . CHOLECYSTECTOMY  2002  . FOOT SURGERY Bilateral 2004   bunion  . GANGLION CYST EXCISION  2002  . INCISION AND DRAINAGE ABSCESS  04-16-15  . PARTIAL HYSTERECTOMY   2010   uterus removed    Family Psychiatric History: Reviewed family psychiatric history from my progress note on 04/12/2017.  Family History:  Family History  Problem Relation Age of Onset  . Parkinson's disease Maternal Grandfather   . Diabetes Father   . Cancer Father        brain tumor  . Depression Father   . Diabetes Paternal Grandfather   . Colon polyps Mother   . Heart disease Maternal Grandmother   . Heart attack Maternal Grandmother   . Stroke Maternal Grandmother   . Depression Sister   . Alcohol abuse Brother   . Depression Brother   . Breast cancer Paternal Aunt   . Breast cancer Cousin     Social History: Reviewed social history from my progress note on 04/12/2017. Social History   Socioeconomic History  . Marital status: Married    Spouse name: Not on file  . Number of children: 3  . Years of education: hs  . Highest education level: Some college, no degree  Occupational History  . Occupation: Designer, multimedia: [place]  Social Needs  . Financial resource strain: Hard  . Food insecurity:    Worry: Never true    Inability: Never true  . Transportation needs:    Medical: No    Non-medical: No  Tobacco Use  . Smoking status: Current Every Day Smoker    Packs/day: 1.00    Years: 40.00    Pack years: 40.00    Types: Cigarettes  . Smokeless tobacco: Never Used  Substance and Sexual Activity  . Alcohol use: No    Alcohol/week: 1.0 standard drinks    Types: 1 Glasses of wine per week    Comment: occasional  . Drug use: Yes    Types: Marijuana    Comment: last used 05-20-15  . Sexual activity: Yes    Birth control/protection: None  Lifestyle  . Physical activity:    Days per week: 0 days    Minutes per session: 0 min  . Stress: Very much  Relationships  . Social connections:    Talks on phone: More than three times a week    Gets together: More than three times a week    Attends religious  service: More than 4 times per year    Active member of club or organization: No    Attends meetings of clubs or organizations: Never    Relationship status: Separated  Other Topics Concern  . Not on file  Social History Narrative   Patient lives at home with her husband [name]).   Patient works full time.   Education CNA    Right handed.   Caffeine Tea , Soda.    Allergies:  Allergies  Allergen Reactions  . Other Other (See Comments)    PT states that anything with the sides effects "may cause rash" she cannot take due to her psoriasis, it will  make the patches on her hand so thick she can't use her hands.   . Sulfa Antibiotics Swelling    Diarrhea,dizziness, tongue swelling  . Abilify [Aripiprazole] Other (See Comments)  . Cephalosporins Rash  . Erythromycin Other (See Comments)    Abd. pain  . Lamictal [Lamotrigine] Rash    psorasis worsen  . Risperdal [Risperidone]     tremors    Metabolic Disorder Labs: Lab Results  Component Value Date   HGBA1C 5.3 04/14/2017   MPG 105.41 04/14/2017   Lab Results  Component Value Date   PROLACTIN 12.5 04/14/2017   Lab Results  Component Value Date   CHOL 219 (H) 04/14/2017   TRIG 257 (H) 04/14/2017   HDL 46 04/14/2017   CHOLHDL 4.8 04/14/2017   VLDL 51 (H) 04/14/2017   LDLCALC 122 (H) 04/14/2017   LDLCALC 98 05/30/2015   Lab Results  Component Value Date   TSH 2.376 04/14/2017   TSH 2.940 06/15/2016    Therapeutic Level Labs: Lab Results  Component Value Date   LITHIUM 0.34 (L) 04/14/2017   LITHIUM 0.3 (L) 06/15/2016   Lab Results  Component Value Date   VALPROATE 15 (L) 05/11/2018   VALPROATE 36 (L) 06/27/2017   No components found for:  CBMZ  Current Medications: Current Outpatient Medications  Medication Sig Dispense Refill  . ALPRAZolam (XANAX) 0.25 MG tablet Take 0.25 mg by mouth at bedtime.  0  . cyclobenzaprine (FLEXERIL) 5 MG tablet Take 5 mg by mouth 2 (two) times daily at 10 AM and 5 PM.    .  divalproex (DEPAKOTE) 250 MG DR tablet TAKE 1 TABLET (250 MG TOTAL) BY MOUTH 3 (THREE) TIMES DAILY. 90 tablet 5  . KLOR-CON M20 20 MEQ tablet Take 20 mEq by mouth 2 (two) times daily.  4  . megestrol (MEGACE) 400 MG/10ML suspension Take 10 mLs (400 mg total) by mouth daily. 240 mL 0  . propranolol (INDERAL) 20 MG tablet Take 1 tablet (20 mg total) by mouth 3 (three) times daily. 270 tablet 0  . QUEtiapine (SEROQUEL) 50 MG tablet Take 1 tablet (50 mg total) by mouth at bedtime. 90 tablet 0   No current facility-administered medications for this visit.      Musculoskeletal: Strength & Muscle Tone: UTA Gait & Station: UTA Patient leans: N/A  Psychiatric Specialty Exam: Review of Systems  Psychiatric/Behavioral: The patient is nervous/anxious.   All other systems reviewed and are negative.   Last menstrual period 02/09/2008.There is no height or weight on file to calculate BMI.  General Appearance: UTA  Eye Contact:  UTA  Speech:  Normal Rate  Volume:  Normal  Mood:  Anxious  Affect:  UTA  Thought Process:  Goal Directed and Descriptions of Associations: Intact  Orientation:  Full (Time, Place, and Person)  Thought Content: Logical   Suicidal Thoughts:  No  Homicidal Thoughts:  No  Memory:  Immediate;   Fair Recent;   Fair Remote;   Fair  Judgement:  Fair  Insight:  Fair  Psychomotor Activity:  UTA  Concentration:  Concentration: Fair and Attention Span: Fair  Recall:  [employer] of Knowledge: Fair  Language: Fair  Akathisia:  No  Handed:  Right  AIMS (if indicated): Denies tremors, rigidity, stiffness  Assets:  Communication Skills Desire for Improvement Social Support  ADL's:  Intact  Cognition: WNL  Sleep:  Fair   Screenings: AIMS     Office Visit from 05/03/2016 in [facility]  Admission (Discharged) from 01/15/2015 in [place] Total Score  0  0    AUDIT     Admission (Discharged) from 01/15/2015 in  [place]  Alcohol Use Disorder Identification Test Final Score (AUDIT)  1    GAD-7     Office Visit from 04/18/2015 in [facility]  Total GAD-7 Score  14    PHQ2-9     Office Visit from 04/18/2015 in [facility] Office Visit from 07/11/2014 in [place]  PHQ-2 Total Score  4  0  PHQ-9 Total Score  14  -       Assessment and Plan: Lorell is a 58 year old Caucasian female who has a history of bipolar disorder, cannabis use disorder, psoriasis, arthritis was evaluated by telemedicine today.  Patient reports she is doing well on the current medication regimen.  She was noncompliant with follow-up visits for a while due to having problems with her job.  Patient however reports she wants to follow-up appointments and want to continue medications as noted below.  Plan as discussed below.  Plan For bipolar disorder-improving Depakote 250 mg p.o. 3 times daily Depakote level on 05/11/2018- 15-subtherapeutic.  Patient however reports she was noncompliant with medications on and off. Continue Seroquel 50 mg p.o. nightly Reviewed and discussed Depakote level-15-4 03/30/2018-subtherapeutic, CMP-within normal limits.  For anxiety disorder-improving Propranolol 10 mg p.o. 3 times daily.  For insomnia-stable Seroquel 50 mg p.o. nightly  For cannabis use disorder in early remission Monitor closely.  For tobacco use disorder-unstable Provided smoking cessation counseling.  Follow-up in clinic in 1 to 2 months or sooner if needed.  Sheryl have spent atleast 15 minutes non face to face with patient today. More than 50 % of the time was spent for psychoeducation and supportive psychotherapy and care coordination.  This note was generated in part or whole with voice recognition software. Voice recognition is usually quite accurate but there are transcription errors that can and very often do occur. Sheryl apologize for any typographical  errors that were not detected and corrected.       Ursula Alert, MD 05/11/2018, 4:21 PM

## 2018-05-11 NOTE — Progress Notes (Signed)
Tc on  05-29-18 @ 4:02 pt called states that nothing has changed medication and pharmacy reviewed and no changes, allergies reviewed with no changes, vital not able to do because this was a phone consult.

## 2018-05-11 NOTE — Telephone Encounter (Signed)
pt states that she is returning your call . states she takes lunch at 12 if you can call her back then

## 2018-08-03 ENCOUNTER — Other Ambulatory Visit: Payer: Self-pay | Admitting: Psychiatry

## 2018-08-03 DIAGNOSIS — F3162 Bipolar disorder, current episode mixed, moderate: Secondary | ICD-10-CM

## 2018-08-10 ENCOUNTER — Encounter: Payer: Self-pay | Admitting: Psychiatry

## 2018-08-10 ENCOUNTER — Other Ambulatory Visit: Payer: Self-pay

## 2018-08-10 ENCOUNTER — Ambulatory Visit (INDEPENDENT_AMBULATORY_CARE_PROVIDER_SITE_OTHER): Payer: Self-pay | Admitting: Psychiatry

## 2018-08-10 DIAGNOSIS — F1220 Cannabis dependence, uncomplicated: Secondary | ICD-10-CM

## 2018-08-10 DIAGNOSIS — F17200 Nicotine dependence, unspecified, uncomplicated: Secondary | ICD-10-CM

## 2018-08-10 DIAGNOSIS — F3162 Bipolar disorder, current episode mixed, moderate: Secondary | ICD-10-CM

## 2018-08-10 DIAGNOSIS — F603 Borderline personality disorder: Secondary | ICD-10-CM

## 2018-08-10 NOTE — Progress Notes (Addendum)
Virtual Visit via Video Note  I connected with Sheryl Perez on 08/10/18 at  4:00 PM EDT by a video enabled telemedicine application and verified that I am speaking with the correct person using two identifiers.   I discussed the limitations of evaluation and management by telemedicine and the availability of in person appointments. The patient expressed understanding and agreed to proceed.  I discussed the assessment and treatment plan with the patient. The patient was provided an opportunity to ask questions and all were answered. The patient agreed with the plan and demonstrated an understanding of the instructions.   The patient was advised to call back or seek an in-person evaluation if the symptoms worsen or if the condition fails to improve as anticipated.   [facility] MD OP Progress Note  08/10/2018 6:35 PM Sheryl Perez  MRN:  [id number]  Chief Complaint:  Chief Complaint    Follow-up     HPI: Sheryl Perez is a 58 year old Caucasian female who has a history of bipolar disorder, borderline personality disorder, cannabis use disorder in early remission, tobacco use disorder, was evaluated by telemedicine today.  Patient today reports she is currently doing well with regards to her mood symptoms.  She denies any significant sadness or hypomanic or manic episodes.  She reports sleep is good.  She continues to be compliant on her medications.  She reports she does not have any side effects to her medications.  Discussed getting her Depakote labs done and she agrees with plan.  Also discussed getting her metabolic panel done.  Will mail lab slip to her today.  Patient reports she has been cutting back on her smoking.  Patient denies any other concerns today.   Visit Diagnosis:    ICD-10-CM   1. Bipolar 1 disorder, mixed, moderate (HCC)  F31.62   2. Tobacco use disorder  F17.200   3. Borderline personality disorder ([place])  F60.3   4. Cannabis use disorder, moderate, dependence (HCC)   F12.20     Past Psychiatric History: I have reviewed past psychiatric history from my progress note on 04/12/2017.  Past trials of Risperdal, Abilify, Lamictal, lithium, Prozac, trazodone.  Past Medical History:  Past Medical History:  Diagnosis Date  . Back injury   . 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 Bipolar disorder ([place])   . Chest pain, unspecified   . Chronic low back pain 10/23/2013  . Chronic UTI   . Depression   . Dysfunction of eustachian tube   . Enlargement of lymph nodes   . History of Bell's palsy Right  . Injury, other and unspecified, knee, leg, ankle, and foot   . MRSA (methicillin resistant staph aureus) culture positive 07-11-14   abscess  . Other specified disease of hair and hair follicles   . Rectal prolapse   . Unspecified symptom associated with female genital organs     Past Surgical History:  Procedure Laterality Date  . ABDOMINAL HYSTERECTOMY    . bladder tack  2010  . CHOLECYSTECTOMY  2002  . FOOT SURGERY Bilateral 2004   bunion  . GANGLION CYST EXCISION  2002  . INCISION AND DRAINAGE ABSCESS  04-16-15  . PARTIAL HYSTERECTOMY  2010   uterus removed    Family Psychiatric History: I have reviewed family psychiatric history from my progress note on 04/12/2017.  Family History:  Family History  Problem Relation Age of Onset  . Parkinson's disease Maternal Grandfather   . Diabetes Father   .  Cancer Father        brain tumor  . Depression Father   . Diabetes Paternal Grandfather   . Colon polyps Mother   . Heart disease Maternal Grandmother   . Heart attack Maternal Grandmother   . Stroke Maternal Grandmother   . Depression Sister   . Alcohol abuse Brother   . Depression Brother   . Breast cancer Paternal Aunt   . Breast cancer Cousin     Social History: I have reviewed social history from my progress note on 04/12/2017. Social History   Socioeconomic History  . Marital status: Married    Spouse name: Not on file  .  Number of children: 3  . Years of education: hs  . Highest education level: Some college, no degree  Occupational History  . 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 Financial resource strain: Hard  . Food insecurity    Worry: Never true    Inability: Never true  . Transportation needs    Medical: No    Non-medical: No  Tobacco Use  . Smoking status: Current Every Day Smoker    Packs/day: 1.00    Years: 40.00    Pack years: 40.00    Types: Cigarettes  . Smokeless tobacco: Never Used  Substance and Sexual Activity  . Alcohol use: No    Alcohol/week: 1.0 standard drinks    Types: 1 Glasses of wine per week    Comment: occasional  . Drug use: Yes    Types: Marijuana    Comment: last used 05-20-15  . Sexual activity: Yes    Birth control/protection: None  Lifestyle  . Physical activity    Days per week: 0 days    Minutes per session: 0 min  . Stress: Very much  Relationships  . Social connections    Talks on phone: More than three times a week    Gets together: More than three times a week    Attends religious service: More than 4 times per year    Active member of club or organization: No    Attends meetings of clubs or organizations: Never    Relationship status: Separated  Other Topics Concern  . Not on file  Social History Narrative   Patient lives at home with her husband Sheryl Perez).   Patient works full time.   Education CNA    Right handed.   Caffeine Tea , Soda.    Allergies:  Allergies  Allergen Reactions  . Other Other (See Comments)    PT states that anything with the sides effects "may cause rash" she cannot take due to her psoriasis, it will make the patches on her hand so thick she can't use her hands.   . Sulfa Antibiotics Swelling    Diarrhea,dizziness, tongue swelling  . Abilify [Aripiprazole] Other (See Comments)  . Cephalosporins Rash  . Erythromycin Other (See Comments)     Abd. pain  . Lamictal [Lamotrigine] Rash    psorasis worsen  . Risperdal [Risperidone]     tremors    Metabolic Disorder Labs: Lab Results  Component Value Date   HGBA1C 5.3 04/14/2017   MPG 105.41 04/14/2017   Lab Results  Component Value Date   PROLACTIN 12.5 04/14/2017   Lab Results  Component Value Date   CHOL 219 (H) 04/14/2017   TRIG 257 (H) 04/14/2017   HDL 46 04/14/2017   CHOLHDL 4.8 04/14/2017  VLDL 51 (H) 04/14/2017   LDLCALC 122 (H) 04/14/2017   LDLCALC 98 05/30/2015   Lab Results  Component Value Date   TSH 2.376 04/14/2017   TSH 2.940 06/15/2016    Therapeutic Level Labs: Lab Results  Component Value Date   LITHIUM 0.34 (L) 04/14/2017   LITHIUM 0.3 (L) 06/15/2016   Lab Results  Component Value Date   VALPROATE 15 (L) 05/11/2018   VALPROATE 36 (L) 06/27/2017   No components found for:  CBMZ  Current Medications: Current Outpatient Medications  Medication Sig Dispense Refill  . ALPRAZolam (XANAX) 0.25 MG tablet Take 0.25 mg by mouth at bedtime.  0  . cyclobenzaprine (FLEXERIL) 5 MG tablet Take 5 mg by mouth 2 (two) times daily at 10 AM and 5 PM.    . divalproex (DEPAKOTE) 250 MG DR tablet TAKE 1 TABLET (250 MG TOTAL) BY MOUTH 3 (THREE) TIMES DAILY. 90 tablet 5  . KLOR-CON M20 20 MEQ tablet Take 20 mEq by mouth 2 (two) times daily.  4  . megestrol (MEGACE) 400 MG/10ML suspension Take 10 mLs (400 mg total) by mouth daily. 240 mL 0  . ondansetron (ZOFRAN-ODT) 4 MG disintegrating tablet     . pantoprazole (PROTONIX) 40 MG tablet     . Potassium Chloride ER 20 MEQ TBCR     . propranolol (INDERAL) 20 MG tablet TAKE 1 TABLET BY MOUTH THREE TIMES A DAY 270 tablet 0  . QUEtiapine (SEROQUEL) 50 MG tablet TAKE 1 TABLET BY MOUTH EVERYDAY AT BEDTIME 90 tablet 0   No current facility-administered medications for this visit.      Musculoskeletal: Strength & Muscle Tone: within normal limits Gait & Station: normal Patient leans: N/A  Psychiatric  Specialty Exam: Review of Systems  Psychiatric/Behavioral: The patient is not nervous/anxious.   All other systems reviewed and are negative.   Last menstrual period 02/09/2008.There is no height or weight on file to calculate BMI.  General Appearance: Casual  Eye Contact:  Fair  Speech:  Clear and Coherent  Volume:  Normal  Mood:  Euthymic  Affect:  Congruent  Thought Process:  Goal Directed and Descriptions of Associations: Intact  Orientation:  Full (Time, Place, and Person)  Thought Content: Logical   Suicidal Thoughts:  No  Homicidal Thoughts:  No  Memory:  Immediate;   Fair Recent;   Fair Remote;   Fair  Judgement:  Fair  Insight:  Fair  Psychomotor Activity:  Normal  Concentration:  Concentration: Fair and Attention Span: Fair  Recall:  [employer] of Knowledge: Fair  Language: Fair  Akathisia:  No  Handed:  Right  AIMS (if indicated): denies tremors, rigidity  Assets:  Communication Skills Desire for Improvement Social Support  ADL's:  Intact  Cognition: WNL  Sleep:  Fair   Screenings: AIMS     Office Visit from 05/03/2016 in [facility] Admission (Discharged) from 01/15/2015 in [facility] Total Score  0  0    AUDIT     Admission (Discharged) from 01/15/2015 in [facility]  Alcohol Use Disorder Identification Test Final Score (AUDIT)  1    GAD-7     Office Visit from 04/18/2015 in [facility]  Total GAD-7 Score  14    PHQ2-9     Office Visit from 04/18/2015 in [facility] Office Visit from 07/11/2014 in [facility]  PHQ-2 Total Score  4  0  PHQ-9 Total Score  14  -       Assessment and Plan: [place] is a 58 year old Caucasian female who has a history of bipolar disorder, cannabis use disorder, psoriasis, arthritis was evaluated by telemedicine today.  Patient is currently doing well on the current medication regimen.  Plan For  bipolar disorder-stable Depakote 250 mg p.o. 3 times daily Depakote level on 05/11/2018 20-15-subtherapeutic.  Patient at that time had reported that she was noncompliant with medications. Will order Depakote labs again.  Will mail it to her. Seroquel 50 mg p.o. nightly.  For anxiety disorder-improving Propranolol 10 mg p.o. 3 times daily.  For insomnia-stable Seroquel 50 mg p.o. nightly  For cannabis use disorder in early remission Monitor closely.  For tobacco use disorder-unstable Provided smoking cessation counseling.  We will order the following labs-Depakote level, prolactin, TSH, hemoglobin A1c, lipid panel.  Follow-up in clinic in 2 months or sooner if needed.  Follow-up in clinic in 2 to 3 months or sooner if needed.  October 1 at 4 PM  I have spent atleast 15 minutes non face to face with patient today. More than 50 % of the time was spent for psychoeducation and supportive psychotherapy and care coordination.  This note was generated in part or whole with voice recognition software. Voice recognition is usually quite accurate but there are transcription errors that can and very often do occur. I apologize for any typographical errors that were not detected and corrected.        Ursula Alert, MD 08/10/2018, 6:35 PM

## 2018-08-24 ENCOUNTER — Other Ambulatory Visit
Admission: RE | Admit: 2018-08-24 | Discharge: 2018-08-24 | Disposition: A | Discharge status: Home / Self Care | Payer: 59 | Source: Ambulatory Visit | Attending: Psychiatry | Admitting: Psychiatry

## 2018-08-24 DIAGNOSIS — F3162 Bipolar disorder, current episode mixed, moderate: Secondary | ICD-10-CM | POA: Insufficient documentation

## 2018-08-24 LAB — COMPREHENSIVE METABOLIC PANEL
ALT: 13 U/L (ref 0–44)
AST: 18 U/L (ref 15–41)
Albumin: 3.9 g/dL (ref 3.5–5.0)
Alkaline Phosphatase: 71 U/L (ref 38–126)
Anion gap: 10 (ref 5–15)
BUN: 12 mg/dL (ref 6–20)
CO2: 24 mmol/L (ref 22–32)
Calcium: 9.2 mg/dL (ref 8.9–10.3)
Chloride: 104 mmol/L (ref 98–111)
Creatinine, Ser: 0.89 mg/dL (ref 0.44–1.00)
GFR calc Af Amer: >60 mL/min (ref >60)
GFR calc non Af Amer: >60 mL/min (ref >60)
Glucose, Bld: 108 mg/dL — ABNORMAL HIGH (ref 70–99)
Potassium: 3.7 mmol/L (ref 3.5–5.1)
Sodium: 138 mmol/L (ref 135–145)
Total Bilirubin: 0.7 mg/dL (ref 0.3–1.2)
Total Protein: 7.4 g/dL (ref 6.5–8.1)

## 2018-08-24 LAB — VALPROIC ACID LEVEL: Valproic Acid Lvl: 32 ug/mL — ABNORMAL LOW (ref 50.0–100.0)

## 2018-08-24 LAB — LIPID PANEL
Cholesterol: 190 mg/dL (ref 0–200)
HDL: 46 mg/dL (ref >40)
LDL Cholesterol: 107 mg/dL — ABNORMAL HIGH (ref 0–99)
Total CHOL/HDL Ratio: 4.1 RATIO
Triglycerides: 185 mg/dL — ABNORMAL HIGH (ref <150)
VLDL: 37 mg/dL (ref 0–40)

## 2018-08-24 LAB — HEMOGLOBIN A1C
Hgb A1c MFr Bld: 5.3 % (ref 4.8–5.6)
Mean Plasma Glucose: 105.41 mg/dL

## 2018-08-25 LAB — PROLACTIN: Prolactin: 9.3 ng/mL (ref 4.8–23.3)

## 2018-11-05 ENCOUNTER — Other Ambulatory Visit: Payer: Self-pay | Admitting: Psychiatry

## 2018-11-05 DIAGNOSIS — F3162 Bipolar disorder, current episode mixed, moderate: Secondary | ICD-10-CM

## 2018-11-09 ENCOUNTER — Encounter: Payer: Self-pay | Admitting: Psychiatry

## 2018-11-09 ENCOUNTER — Ambulatory Visit (INDEPENDENT_AMBULATORY_CARE_PROVIDER_SITE_OTHER): Payer: 59 | Admitting: Psychiatry

## 2018-11-09 ENCOUNTER — Other Ambulatory Visit: Payer: Self-pay

## 2018-11-09 DIAGNOSIS — F3170 Bipolar disorder, currently in remission, most recent episode unspecified: Secondary | ICD-10-CM

## 2018-11-09 DIAGNOSIS — F603 Borderline personality disorder: Secondary | ICD-10-CM

## 2018-11-09 DIAGNOSIS — F17200 Nicotine dependence, unspecified, uncomplicated: Secondary | ICD-10-CM | POA: Diagnosis not present

## 2018-11-09 DIAGNOSIS — F1220 Cannabis dependence, uncomplicated: Secondary | ICD-10-CM

## 2018-11-09 MED ORDER — DIVALPROEX SODIUM 250 MG PO DR TAB: 250.0000 mg | DELAYED_RELEASE_TABLET | Freq: Three times a day (TID) | ORAL | 1 refills | Status: DC

## 2018-11-09 NOTE — Progress Notes (Signed)
Virtual Visit via Video Note  I connected with Sheryl Perez on 11/09/18 at  4:00 PM EDT by a video enabled telemedicine application and verified that I am speaking with the correct person using two identifiers.   I discussed the limitations of evaluation and management by telemedicine and the availability of in person appointments. The patient expressed understanding and agreed to proceed.   I discussed the assessment and treatment plan with the patient. The patient was provided an opportunity to ask questions and all were answered. The patient agreed with the plan and demonstrated an understanding of the instructions.   The patient was advised to call back or seek an in-person evaluation if the symptoms worsen or if the condition fails to improve as anticipated.   [facility] MD OP Progress Note  11/09/2018 5:03 PM Sheryl Perez  MRN:  [id number]  Chief Complaint:  Chief Complaint    Follow-up     HPI: Sheryl Perez is a 58 year old Caucasian female who has a history of bipolar disorder, borderline personality disorder, cannabis use disorder in early remission, tobacco use disorder was evaluated by telemedicine today.  Patient today reports she is currently doing well.  She denies any depressive symptoms, mood lability or anxiety symptoms.  She reports sleep is good.  She reports she tries to be compliant on her medication however there has been days when she skipped her Depakote dosages.  Reviewed and discussed her most recent Depakote level with her which came back as subtherapeutic at 36.  Patient reports she continues to smoke cigarettes and is not willing to cut back.  She reports she continues to stay away from cannabis.  She reports work is going well.  She denies any suicidality, homicidality or perceptual disturbances.     Visit Diagnosis:    ICD-10-CM   1. Bipolar I disorder, most recent episode mixed, in remission ([place])  F31.70 divalproex (DEPAKOTE) 250 MG DR tablet  2.  Tobacco use disorder  F17.200   3. Borderline personality disorder ([place])  F60.3   4. Cannabis use disorder, moderate, dependence (HCC)  F12.20     Past Psychiatric History: I have reviewed past psychiatric history from my progress note on 04/12/2017.  Past trials of risperidone, Abilify, Lamictal, lithium, Prozac, trazodone  Past Medical History:  Past Medical History:  Diagnosis Date  . Back injury   . 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 Bipolar disorder ([place])   . Chest pain, unspecified   . Chronic low back pain 10/23/2013  . Chronic UTI   . Depression   . Dysfunction of eustachian tube   . Enlargement of lymph nodes   . History of Bell's palsy Right  . Injury, other and unspecified, knee, leg, ankle, and foot   . MRSA (methicillin resistant staph aureus) culture positive 07-11-14   abscess  . Other specified disease of hair and hair follicles   . Rectal prolapse   . Unspecified symptom associated with female genital organs     Past Surgical History:  Procedure Laterality Date  . ABDOMINAL HYSTERECTOMY    . bladder tack  2010  . CHOLECYSTECTOMY  2002  . FOOT SURGERY Bilateral 2004   bunion  . GANGLION CYST EXCISION  2002  . INCISION AND DRAINAGE ABSCESS  04-16-15  . PARTIAL HYSTERECTOMY  2010   uterus removed    Family Psychiatric History: Reviewed family psychiatric history from my progress note on 04/12/2017  Family History:  Family History  Problem  Relation Age of Onset  . Parkinson's disease Maternal Grandfather   . Diabetes Father   . Cancer Father        brain tumor  . Depression Father   . Diabetes Paternal Grandfather   . Colon polyps Mother   . Heart disease Maternal Grandmother   . Heart attack Maternal Grandmother   . Stroke Maternal Grandmother   . Depression Sister   . Alcohol abuse Brother   . Depression Brother   . Breast cancer Paternal Aunt   . Breast cancer Cousin     Social History: Reviewed social history from my progress  note on 04/12/2017 Social History   Socioeconomic History  . Marital status: Married    Spouse name: Not on file  . Number of children: 3  . Years of education: hs  . Highest education level: Some college, no degree  Occupational History  . 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 Financial resource strain: Hard  . Food insecurity    Worry: Never true    Inability: Never true  . Transportation needs    Medical: No    Non-medical: No  Tobacco Use  . Smoking status: Current Every Day Smoker    Packs/day: 1.00    Years: 40.00    Pack years: 40.00    Types: Cigarettes  . Smokeless tobacco: Never Used  Substance and Sexual Activity  . Alcohol use: No    Alcohol/week: 1.0 standard drinks    Types: 1 Glasses of wine per week    Comment: occasional  . Drug use: Yes    Types: Marijuana    Comment: last used 05-20-15  . Sexual activity: Yes    Birth control/protection: None  Lifestyle  . Physical activity    Days per week: 0 days    Minutes per session: 0 min  . Stress: Very much  Relationships  . Social connections    Talks on phone: More than three times a week    Gets together: More than three times a week    Attends religious service: More than 4 times per year    Active member of club or organization: No    Attends meetings of clubs or organizations: Never    Relationship status: Separated  Other Topics Concern  . Not on file  Social History Narrative   Patient lives at home with her husband [name]).   Patient works full time.   Education CNA    Right handed.   Caffeine Tea , Soda.    Allergies:  Allergies  Allergen Reactions  . Other Other (See Comments)    PT states that anything with the sides effects "may cause rash" she cannot take due to her psoriasis, it will make the patches on her hand so thick she can't use her hands.   . Sulfa Antibiotics Swelling    Diarrhea,dizziness, tongue  swelling  . Abilify [Aripiprazole] Other (See Comments)  . Cephalosporins Rash  . Erythromycin Other (See Comments)    Abd. pain  . Lamictal [Lamotrigine] Rash    psorasis worsen  . Risperdal [Risperidone]     tremors    Metabolic Disorder Labs: Lab Results  Component Value Date   HGBA1C 5.3 08/24/2018   MPG 105.41 08/24/2018   MPG 105.41 04/14/2017   Lab Results  Component Value Date   PROLACTIN 9.3 08/24/2018   PROLACTIN 12.5 04/14/2017   Lab Results  Component Value Date   CHOL 190 08/24/2018   TRIG 185 (H) 08/24/2018   HDL 46 08/24/2018   CHOLHDL 4.1 08/24/2018   VLDL 37 08/24/2018   LDLCALC 107 (H) 08/24/2018   LDLCALC 122 (H) 04/14/2017   Lab Results  Component Value Date   TSH 2.376 04/14/2017   TSH 2.940 06/15/2016    Therapeutic Level Labs: Lab Results  Component Value Date   LITHIUM 0.34 (L) 04/14/2017   LITHIUM 0.3 (L) 06/15/2016   Lab Results  Component Value Date   VALPROATE 32 (L) 08/24/2018   VALPROATE 15 (L) 05/11/2018   No components found for:  CBMZ  Current Medications: Current Outpatient Medications  Medication Sig Dispense Refill  . ALPRAZolam (XANAX) 0.25 MG tablet Take 0.25 mg by mouth at bedtime.  0  . cyclobenzaprine (FLEXERIL) 5 MG tablet Take 5 mg by mouth 2 (two) times daily at 10 AM and 5 PM.    . divalproex (DEPAKOTE) 250 MG DR tablet Take 1 tablet (250 mg total) by mouth 3 (three) times daily. 270 tablet 1  . KLOR-CON M20 20 MEQ tablet Take 20 mEq by mouth 2 (two) times daily.  4  . megestrol (MEGACE) 400 MG/10ML suspension Take 10 mLs (400 mg total) by mouth daily. 240 mL 0  . ondansetron (ZOFRAN-ODT) 4 MG disintegrating tablet     . pantoprazole (PROTONIX) 40 MG tablet     . Potassium Chloride ER 20 MEQ TBCR     . propranolol (INDERAL) 20 MG tablet TAKE 1 TABLET BY MOUTH THREE TIMES A DAY 270 tablet 0  . QUEtiapine (SEROQUEL) 50 MG tablet TAKE 1 TABLET BY MOUTH EVERYDAY AT BEDTIME 90 tablet 0   No current  facility-administered medications for this visit.      Musculoskeletal: Strength & Muscle Tone: UTA Gait & Station: normal Patient leans: N/A  Psychiatric Specialty Exam: Review of Systems  Psychiatric/Behavioral: Negative for substance abuse and suicidal ideas. The patient is not nervous/anxious and does not have insomnia.   All other systems reviewed and are negative.   Last menstrual period 02/09/2008.There is no height or weight on file to calculate BMI.  General Appearance: Casual  Eye Contact:  Fair  Speech:  Clear and Coherent  Volume:  Normal  Mood:  Euthymic  Affect:  Congruent  Thought Process:  Goal Directed and Descriptions of Associations: Intact  Orientation:  Full (Time, Place, and Person)  Thought Content: Logical   Suicidal Thoughts:  No  Homicidal Thoughts:  No  Memory:  Immediate;   Fair Recent;   Fair Remote;   Fair  Judgement:  Fair  Insight:  Fair  Psychomotor Activity:  Normal  Concentration:  Concentration: Fair and Attention Span: Fair  Recall:  [employer] of Knowledge: Fair  Language: Fair  Akathisia:  No  Handed:  Right  AIMS (if indicated): Denies tremors, rigidity  Assets:  Communication Skills Desire for Improvement Social Support  ADL's:  Intact  Cognition: WNL  Sleep:  Fair   Screenings: AIMS     Office Visit from 05/03/2016 in [facility] Admission (Discharged) from 01/15/2015 in [facility] Total Score  0  0    AUDIT     Admission (Discharged) from 01/15/2015 in [facility] [place]  Alcohol Use Disorder Identification Test Final Score (AUDIT)  1    GAD-7     Office Visit from 04/18/2015 in [facility]  Total GAD-7 Score  14    PHQ2-9     Office Visit from 04/18/2015 in [facility] Office Visit from 07/11/2014 in [facility]  PHQ-2 Total Score  4  0  PHQ-9 Total Score  14  -       Assessment and Plan:  Sheryl Perez is a 58 year old Caucasian female who has a history of bipolar disorder, cannabis use disorder, psoriasis, arthritis was evaluated by telemedicine today.  Patient is currently stable on current medications.  Plan as noted below.  Plan Bipolar disorder- in remission Depakote 250 mg 3 times daily Depakote level on 08/24/2018 -32-subtherapeutic.  However she does report that she has skipped Depakote dosages on and off.  She however is currently stable on medication dosage and hence will not readjust her dosage today.   Seroquel 50 mg p.o. nightly  Anxiety disorder- stable Propranolol 10 mg p.o. 3 times daily  Insomnia-stable Seroquel 50 mg p.o. nightly  Cannabis use disorder in early remission Monitor closely  Tobacco use disorder-unstable Provided smoking cessation counseling.  She is not ready to quit  I have reviewed the following labs-dated 08/24/2018- prolactin 9.3, hemoglobin A1c- 5.3, lipid panel- triglycerides elevated at 185, LDL at 107 elevated.  However it is better than 1 year ago.  She will continue to work with primary care provider CMP-reviewed glucose slightly high, AST ALT within normal limits  Follow-up in clinic in 3 months or sooner if needed.  January 6 at 4 PM  I have spent atleast 25 minutes non face to face with patient today. More than 50 % of the time was spent for psychoeducation and supportive psychotherapy and care coordination. This note was generated in part or whole with voice recognition software. Voice recognition is usually quite accurate but there are transcription errors that can and very often do occur. I apologize for any typographical errors that were not detected and corrected.       Ursula Alert, MD 11/09/2018, 5:03 PM

## 2019-02-14 ENCOUNTER — Other Ambulatory Visit: Payer: Self-pay

## 2019-02-14 ENCOUNTER — Ambulatory Visit (INDEPENDENT_AMBULATORY_CARE_PROVIDER_SITE_OTHER): Payer: 59 | Admitting: Psychiatry

## 2019-02-14 ENCOUNTER — Encounter: Payer: Self-pay | Admitting: Psychiatry

## 2019-02-14 DIAGNOSIS — F1221 Cannabis dependence, in remission: Secondary | ICD-10-CM | POA: Diagnosis not present

## 2019-02-14 DIAGNOSIS — F3178 Bipolar disorder, in full remission, most recent episode mixed: Secondary | ICD-10-CM | POA: Diagnosis not present

## 2019-02-14 DIAGNOSIS — F603 Borderline personality disorder: Secondary | ICD-10-CM

## 2019-02-14 DIAGNOSIS — F17200 Nicotine dependence, unspecified, uncomplicated: Secondary | ICD-10-CM | POA: Diagnosis not present

## 2019-02-14 MED ORDER — QUETIAPINE FUMARATE 50 MG PO TABS: 50.0000 mg | ORAL_TABLET | Freq: Every evening | ORAL | 0 refills | Status: DC | PRN

## 2019-02-14 MED ORDER — PROPRANOLOL HCL 20 MG PO TABS: 20.0000 mg | ORAL_TABLET | Freq: Three times a day (TID) | ORAL | 0 refills | Status: DC

## 2019-02-14 NOTE — Progress Notes (Signed)
Virtual Visit via Video Note  I connected with Sheryl Perez on 02/14/19 at  4:00 PM EST by a video enabled telemedicine application and verified that I am speaking with the correct person using two identifiers.   I discussed the limitations of evaluation and management by telemedicine and the availability of in person appointments. The patient expressed understanding and agreed to proceed.     I discussed the assessment and treatment plan with the patient. The patient was provided an opportunity to ask questions and all were answered. The patient agreed with the plan and demonstrated an understanding of the instructions.   The patient was advised to call back or seek an in-person evaluation if the symptoms worsen or if the condition fails to improve as anticipated.   [facility] MD OP Progress Note  02/14/2019 4:56 PM Sheryl Perez  MRN:  [id number]  Chief Complaint:  Chief Complaint    Follow-up     HPI: Sheryl Perez is a 59 year old Caucasian female who has a history of bipolar disorder, borderline personality disorder, cannabis use disorder in early remission, tobacco use disorder was evaluated by telemedicine today.  Patient today reports mood wise she is doing okay.  She reports she did go through an episode of feeling depressed couple of days in December.  She reports however she was still able to function and cope with it.  She reports she has been taking the Depakote 3 times a day most of the time however when she feels down or depressed she skips 1 dosage since she feels it makes her more sluggish and more sad when she does take it.  Overall she has been doing okay on the current combination of medications.  She reports sleep is good.  She reports she does not take the Seroquel all the time and uses it only when she does not have to go to work the next day since it makes her groggy.    Patient reports propranolol as needed does help her for her anxiety symptoms.  She reports work is  going well.  Patient denies any suicidality, homicidality or perceptual disturbances.  Patient denies any other concerns today. Visit Diagnosis:    ICD-10-CM   1. Bipolar disorder, in full remission, most recent episode mixed ([place])  F31.78 QUEtiapine (SEROQUEL) 50 MG tablet    propranolol (INDERAL) 20 MG tablet  2. Tobacco use disorder  F17.200   3. Borderline personality disorder ([place])  F60.3   4. Cannabis use disorder, moderate, in early remission ([place])  F12.21     Past Psychiatric History: I have reviewed past psychiatric history from my progress note on 04/12/2017.  Past trials of risperidone, Abilify, Lamictal, lithium, Prozac, trazodone  Past Medical History:  Past Medical History:  Diagnosis Date  . Back injury   . 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 Bipolar disorder ([place])   . Chest pain, unspecified   . Chronic low back pain 10/23/2013  . Chronic UTI   . Depression   . Dysfunction of eustachian tube   . Enlargement of lymph nodes   . History of Bell's palsy Right  . Injury, other and unspecified, knee, leg, ankle, and foot   . MRSA (methicillin resistant staph aureus) culture positive 07-11-14   abscess  . Other specified disease of hair and hair follicles   . Rectal prolapse   . Unspecified symptom associated with female genital organs     Past Surgical History:  Procedure Laterality Date  .  ABDOMINAL HYSTERECTOMY    . bladder tack  2010  . CHOLECYSTECTOMY  2002  . FOOT SURGERY Bilateral 2004   bunion  . GANGLION CYST EXCISION  2002  . INCISION AND DRAINAGE ABSCESS  04-16-15  . PARTIAL HYSTERECTOMY  2010   uterus removed    Family Psychiatric History: Reviewed family psychiatric history from my progress note on 04/12/2017.  Family History:  Family History  Problem Relation Age of Onset  . Parkinson's disease Maternal Grandfather   . Diabetes Father   . Cancer Father        brain tumor  . Depression Father   . Diabetes Paternal Grandfather    . Colon polyps Mother   . Heart disease Maternal Grandmother   . Heart attack Maternal Grandmother   . Stroke Maternal Grandmother   . Depression Sister   . Alcohol abuse Brother   . Depression Brother   . Breast cancer Paternal Aunt   . Breast cancer Cousin     Social History: Reviewed social history from my progress note on 04/12/2017. Social History   Socioeconomic History  . Marital status: Married    Spouse name: Not on file  . Number of children: 3  . Years of education: hs  . Highest education level: Some college, no degree  Occupational History  . Occupation: Designer, multimedia: [employer]    Comment: [employer]  Tobacco Use  . Smoking status: Current Every Day Smoker    Packs/day: 1.00    Years: 40.00    Pack years: 40.00    Types: Cigarettes  . Smokeless tobacco: Never Used  Substance and Sexual Activity  . Alcohol use: No    Alcohol/week: 1.0 standard drinks    Types: 1 Glasses of wine per week    Comment: occasional  . Drug use: Yes    Types: Marijuana    Comment: last used 05-20-15  . Sexual activity: Yes    Birth control/protection: None  Other Topics Concern  . Not on file  Social History Narrative   Patient lives at home with her husband [name]).   Patient works full time.   Education CNA    Right handed.   Caffeine Tea , Soda.   Social Determinants of Health   Financial Resource Strain:   . Difficulty of Paying Living Expenses: Not on file  Food Insecurity:   . Worried About Charity fundraiser in the Last Year: Not on file  . Ran Out of Food in the Last Year: Not on file  Transportation Needs:   . Lack of Transportation (Medical): Not on file  . Lack of Transportation (Non-Medical): Not on file  Physical Activity:   . Days of Exercise per Week: Not on file  . Minutes of Exercise per Session: Not on file  Stress:   . Feeling of Stress : Not on file  Social Connections:   . Frequency of Communication with  Friends and Family: Not on file  . Frequency of Social Gatherings with Friends and Family: Not on file  . Attends Religious Services: Not on file  . Active Member of Clubs or Organizations: Not on file  . Attends Archivist Meetings: Not on file  . Marital Status: Not on file    Allergies:  Allergies  Allergen Reactions  . Sulfacetamide Sodium Swelling    Diarrhea,dizziness, tongue swelling  . Other Other (See Comments) and Swelling    PT states that anything with  the sides effects "may cause rash" she cannot take due to her psoriasis, it will make the patches on her hand so thick she can't use her hands.  Diarrhea,dizziness, tongue swelling  . Sulfa Antibiotics Swelling    Diarrhea,dizziness, tongue swelling  . Abilify [Aripiprazole] Other (See Comments)  . Cephalosporins Rash  . Erythromycin Other (See Comments)    Abd. pain Abd. pain Other reaction(s): Other (See Comments) Abd. pain  . Lamotrigine Rash and Other (See Comments)    psorasis worsen psorasis worsen  . Risperidone Other (See Comments)    tremors tremors tremors    Metabolic Disorder Labs: Lab Results  Component Value Date   HGBA1C 5.3 08/24/2018   MPG 105.41 08/24/2018   MPG 105.41 04/14/2017   Lab Results  Component Value Date   PROLACTIN 9.3 08/24/2018   PROLACTIN 12.5 04/14/2017   Lab Results  Component Value Date   CHOL 190 08/24/2018   TRIG 185 (H) 08/24/2018   HDL 46 08/24/2018   CHOLHDL 4.1 08/24/2018   VLDL 37 08/24/2018   LDLCALC 107 (H) 08/24/2018   LDLCALC 122 (H) 04/14/2017   Lab Results  Component Value Date   TSH 2.376 04/14/2017   TSH 2.940 06/15/2016    Therapeutic Level Labs: Lab Results  Component Value Date   LITHIUM 0.34 (L) 04/14/2017   LITHIUM 0.3 (L) 06/15/2016   Lab Results  Component Value Date   VALPROATE 32 (L) 08/24/2018   VALPROATE 15 (L) 05/11/2018   No components found for:  CBMZ  Current Medications: Current Outpatient Medications   Medication Sig Dispense Refill  . ALPRAZolam (XANAX) 0.25 MG tablet Take 0.25 mg by mouth at bedtime.  0  . divalproex (DEPAKOTE) 250 MG DR tablet Take 1 tablet (250 mg total) by mouth 3 (three) times daily. 270 tablet 1  . megestrol (MEGACE) 400 MG/10ML suspension Take 10 mLs (400 mg total) by mouth daily. 240 mL 0  . ondansetron (ZOFRAN-ODT) 4 MG disintegrating tablet     . pantoprazole (PROTONIX) 40 MG tablet     . propranolol (INDERAL) 20 MG tablet Take 1 tablet (20 mg total) by mouth 3 (three) times daily. 270 tablet 0  . QUEtiapine (SEROQUEL) 50 MG tablet Take 1 tablet (50 mg total) by mouth at bedtime as needed. 90 tablet 0  . cyclobenzaprine (FLEXERIL) 5 MG tablet Take 5 mg by mouth 2 (two) times daily at 10 AM and 5 PM.    . KLOR-CON M20 20 MEQ tablet Take 20 mEq by mouth 2 (two) times daily.  4  . Potassium Chloride ER 20 MEQ TBCR      No current facility-administered medications for this visit.     Musculoskeletal: Strength & Muscle Tone: UTA Gait & Station: normal Patient leans: N/A  Psychiatric Specialty Exam: Review of Systems  Psychiatric/Behavioral: Negative for agitation, behavioral problems, confusion, decreased concentration, dysphoric mood, hallucinations, self-injury, sleep disturbance and suicidal ideas. The patient is not nervous/anxious and is not hyperactive.   All other systems reviewed and are negative.   Last menstrual period 02/09/2008.There is no height or weight on file to calculate BMI.  General Appearance: Casual  Eye Contact:  Fair  Speech:  Normal Rate  Volume:  Normal  Mood:  Euthymic  Affect:  Congruent  Thought Process:  Goal Directed and Descriptions of Associations: Intact  Orientation:  Full (Time, Place, and Person)  Thought Content: Logical   Suicidal Thoughts:  No  Homicidal Thoughts:  No  Memory:  Immediate;  Fair Recent;   Fair Remote;   Fair  Judgement:  Fair  Insight:  Fair  Psychomotor Activity:  Normal  Concentration:   Concentration: Fair and Attention Span: Fair  Recall:  [employer] of Knowledge: Fair  Language: Fair  Akathisia:  No  Handed:  Right  AIMS (if indicated): Denies tremors, rigidity  Assets:  Communication Skills Desire for Improvement Housing Social Support  ADL's:  Intact  Cognition: WNL  Sleep:  Fair   Screenings: AIMS     Office Visit from 05/03/2016 in [facility] Admission (Discharged) from 01/15/2015 in [facility] Total Score  0  0    AUDIT     Admission (Discharged) from 01/15/2015 in [facility]  Alcohol Use Disorder Identification Test Final Score (AUDIT)  1    GAD-7     Office Visit from 04/18/2015 in [facility]  Total GAD-7 Score  14    PHQ2-9     Office Visit from 04/18/2015 in [facility] Office Visit from 07/11/2014 in [facility]  PHQ-2 Total Score  4  0  PHQ-9 Total Score  14  --       Assessment and Plan: Varenya is a 59 year old Caucasian female who has a history of bipolar disorder, cannabis use disorder, psoriasis, arthritis was evaluated by telemedicine today.  Patient is currently stable on current medication regimen.  Patient will benefit from the following plan.  Plan Bipolar disorder in remission Depakote 250 mg p.o. 3 times daily. Depakote level on 08/24/2018-32-subtherapeutic. Patient takes Depakote 3 times a day most days however does skip dosages here and there. Seroquel 50 mg p.o. nightly as needed.  Anxiety disorder-stable Propranolol 10 mg p.o. 3 times daily  Insomnia-stable Seroquel 50 mg p.o. nightly as needed  Cannabis use disorder in early remission Will monitor closely  Tobacco use disorder-unstable Provided smoking cessation counseling, she is not ready to quit  Follow-up in clinic in 3 to 4 months or sooner if needed.  April 15 at 4 PM  I have spent atleast 20 minutes non  face to face with patient  today. More than 50 % of the time was spent for ordering medications and test ,psychoeducation and supportive psychotherapy and care coordination,as well as documenting clinical information in electronic health record. This note was generated in part or whole with voice recognition software. Voice recognition is usually quite accurate but there are transcription errors that can and very often do occur. I apologize for any typographical errors that were not detected and corrected.       Ursula Alert, MD 02/14/2019, 4:56 PM

## 2019-04-19 ENCOUNTER — Telehealth: Payer: Self-pay

## 2019-04-19 NOTE — Telephone Encounter (Signed)
Return call to patient-she wonders whether she is having side effects to Depakote since he had she has a lot of symptoms like hot and cold flashes, hair loss, ringing in her ears that blurry vision and joint stiffness.  Discussed with patient to reach out to her primary care provider to run some labs since she is currently struggling with a constellation of symptoms which are not likely all due to her Depakote.  However advised her to hold the Depakote for now for the next 2 to 3 weeks.  Advised patient to call writer to discuss other medications if she is struggling with significant mood symptoms.  She does have upcoming appointment with writer advised patient to call the front desk to schedule a sooner appointment if she needs to be started back on another mood stabilizer.

## 2019-04-19 NOTE — Telephone Encounter (Signed)
Patient called and stated that she's having side effects to her Depakote which are ringing in her ears, hair loss, blurry vision, joint stiffness, and hot & cold flashes. She stated that she would like to talk to you before her appointment on 05/24/19. Please review and advise. Thank you.

## 2019-04-25 ENCOUNTER — Telehealth: Payer: Self-pay | Admitting: Psychiatry

## 2019-04-25 NOTE — Telephone Encounter (Signed)
I have reviewed labs received for patient-dated 12/08/2018- Lipid panel-within normal limits except for LDL cholesterol elevated at 121 Non-HDL cholesterol-144-elevated.  CMP-AST ALT-within normal limits Within normal limits  TSH-within normal limits at 0.87  CBC with differential-MPV-elevated at 13.  Patient to follow-up with her primary care provider for abnormal labs.

## 2019-04-30 ENCOUNTER — Telehealth: Payer: Self-pay

## 2019-04-30 NOTE — Telephone Encounter (Signed)
Patient called regarding her medication. She stated that she was taken off it for the side affects but that she had bad panic attacks so she started back on the medication. If doctor needs to talk with her, she can be reached at [phone]. Thank you.

## 2019-05-01 ENCOUNTER — Telehealth: Payer: Self-pay | Admitting: Psychiatry

## 2019-05-01 DIAGNOSIS — F3178 Bipolar disorder, in full remission, most recent episode mixed: Secondary | ICD-10-CM

## 2019-05-01 DIAGNOSIS — Z79899 Other long term (current) drug therapy: Secondary | ICD-10-CM

## 2019-05-01 MED ORDER — DIVALPROEX SODIUM 250 MG PO DR TAB: 250.0000 mg | DELAYED_RELEASE_TABLET | Freq: Three times a day (TID) | ORAL | 0 refills | Status: DC

## 2019-05-01 MED ORDER — PROPRANOLOL HCL 20 MG PO TABS: 20.0000 mg | ORAL_TABLET | Freq: Three times a day (TID) | ORAL | 0 refills | Status: DC

## 2019-05-01 NOTE — Telephone Encounter (Signed)
Returned call to patient per request from [place]. Patient reports she had an anxiety attack recently after stopping the Depakote due to side effects.  She reports at that time she restarted the Depakote and her anxiety symptoms resolved.  She reports she does not have a lot of side effects of hair loss or joint stiffness anymore and she wonders whether the Depakote actually contributed to it or not.  She had her annual physical exam recently and she reports she was told everything is okay.  She wants to stay on the Depakote for now.  Discussed getting labs done-Depakote level, CMP and CBC with differential.  I have sent Depakote and propranolol to her pharmacy.

## 2019-05-01 NOTE — Telephone Encounter (Signed)
Attempted to contact patient, left voicemail.  

## 2019-05-24 ENCOUNTER — Other Ambulatory Visit: Payer: Self-pay

## 2019-05-24 ENCOUNTER — Encounter: Payer: Self-pay | Admitting: Psychiatry

## 2019-05-24 ENCOUNTER — Ambulatory Visit (INDEPENDENT_AMBULATORY_CARE_PROVIDER_SITE_OTHER): Payer: 59 | Admitting: Psychiatry

## 2019-05-24 DIAGNOSIS — F603 Borderline personality disorder: Secondary | ICD-10-CM | POA: Diagnosis not present

## 2019-05-24 DIAGNOSIS — F1221 Cannabis dependence, in remission: Secondary | ICD-10-CM

## 2019-05-24 DIAGNOSIS — Z79899 Other long term (current) drug therapy: Secondary | ICD-10-CM

## 2019-05-24 DIAGNOSIS — F17200 Nicotine dependence, unspecified, uncomplicated: Secondary | ICD-10-CM | POA: Diagnosis not present

## 2019-05-24 DIAGNOSIS — F3178 Bipolar disorder, in full remission, most recent episode mixed: Secondary | ICD-10-CM

## 2019-05-24 MED ORDER — QUETIAPINE FUMARATE 50 MG PO TABS: 50.0000 mg | ORAL_TABLET | Freq: Every evening | ORAL | 0 refills | Status: DC | PRN

## 2019-05-24 NOTE — Telephone Encounter (Signed)
Done

## 2019-05-24 NOTE — Progress Notes (Signed)
Provider Location : [facility] Patient Location : Home  Virtual Visit via Video Note  I connected with Sheryl Perez on 05/24/19 at  4:00 PM EDT by a video enabled telemedicine application and verified that I am speaking with the correct person using two identifiers.   I discussed the limitations of evaluation and management by telemedicine and the availability of in person appointments. The patient expressed understanding and agreed to proceed.    I discussed the assessment and treatment plan with the patient. The patient was provided an opportunity to ask questions and all were answered. The patient agreed with the plan and demonstrated an understanding of the instructions.   The patient was advised to call back or seek an in-person evaluation if the symptoms worsen or if the condition fails to improve as anticipated.   [facility] MD OP Progress Note  05/24/2019 6:30 PM Sheryl Perez  MRN:  [id number]  Chief Complaint:  Chief Complaint    Follow-up     HPI: Sheryl Perez is a 59 year old Caucasian female who has a history of bipolar disorder, borderline personality disorder, cannabis use disorder in early remission, tobacco use disorder was evaluated by telemedicine today.  Patient is currently doing well on the current medication regimen.  She denies any significant mood lability.  She reports sleep continues to be good.  She reports she has not been able to get the labs done as requested.  She however agrees to get her Depakote level, CMP done as soon as possible.  Patient denies any suicidality, homicidality or perceptual disturbances.  Patient denies any other concerns today. Visit Diagnosis:    ICD-10-CM   1. Bipolar disorder, in full remission, most recent episode mixed (HCC)  F31.78 QUEtiapine (SEROQUEL) 50 MG tablet  2. Tobacco use disorder  F17.200   3. Borderline personality disorder ([place])  F60.3   4. Cannabis use disorder, moderate, in early remission ([place])  F12.21   5. High  risk medication use  Z79.899     Past Psychiatric History: I have reviewed past psychiatric history from my progress note on 04/12/2017.  Past trials of risperidone, Abilify, Lamictal, lithium, Prozac, trazodone  Past Medical History:  Past Medical History:  Diagnosis Date  . Back injury   . 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 Bipolar disorder ([place])   . Chest pain, unspecified   . Chronic low back pain 10/23/2013  . Chronic UTI   . Depression   . Dysfunction of eustachian tube   . Enlargement of lymph nodes   . History of Bell's palsy Right  . Injury, other and unspecified, knee, leg, ankle, and foot   . MRSA (methicillin resistant staph aureus) culture positive 07-11-14   abscess  . Other specified disease of hair and hair follicles   . Rectal prolapse   . Unspecified symptom associated with female genital organs     Past Surgical History:  Procedure Laterality Date  . ABDOMINAL HYSTERECTOMY    . bladder tack  2010  . CHOLECYSTECTOMY  2002  . FOOT SURGERY Bilateral 2004   bunion  . GANGLION CYST EXCISION  2002  . INCISION AND DRAINAGE ABSCESS  04-16-15  . PARTIAL HYSTERECTOMY  2010   uterus removed    Family Psychiatric History: I have reviewed family psychiatric history from my progress note on 04/12/2017  Family History:  Family History  Problem Relation Age of Onset  . Parkinson's disease Maternal Grandfather   . Diabetes Father   .  Cancer Father        brain tumor  . Depression Father   . Diabetes Paternal Grandfather   . Colon polyps Mother   . Heart disease Maternal Grandmother   . Heart attack Maternal Grandmother   . Stroke Maternal Grandmother   . Depression Sister   . Alcohol abuse Brother   . Depression Brother   . Breast cancer Paternal Aunt   . Breast cancer Cousin     Social History: Reviewed social history from my progress note on 04/12/2017 Social History   Socioeconomic History  . Marital status: Married    Spouse name: Not on  file  . Number of children: 3  . Years of education: hs  . Highest education level: Some college, no degree  Occupational History  . Occupation: Designer, multimedia: [employer]    Comment: [employer]  Tobacco Use  . Smoking status: Current Every Day Smoker    Packs/day: 1.00    Years: 40.00    Pack years: 40.00    Types: Cigarettes  . Smokeless tobacco: Never Used  Substance and Sexual Activity  . Alcohol use: No    Alcohol/week: 1.0 standard drinks    Types: 1 Glasses of wine per week    Comment: occasional  . Drug use: Yes    Types: Marijuana    Comment: last used 05-20-15  . Sexual activity: Yes    Birth control/protection: None  Other Topics Concern  . Not on file  Social History Narrative   Patient lives at home with her husband [name]).   Patient works full time.   Education CNA    Right handed.   Caffeine Tea , Soda.   Social Determinants of Health   Financial Resource Strain:   . Difficulty of Paying Living Expenses:   Food Insecurity:   . Worried About Charity fundraiser in the Last Year:   . Arboriculturist in the Last Year:   Transportation Needs:   . Film/video editor (Medical):   Marland Kitchen Lack of Transportation (Non-Medical):   Physical Activity:   . Days of Exercise per Week:   . Minutes of Exercise per Session:   Stress:   . Feeling of Stress :   Social Connections:   . Frequency of Communication with Friends and Family:   . Frequency of Social Gatherings with Friends and Family:   . Attends Religious Services:   . Active Member of Clubs or Organizations:   . Attends Archivist Meetings:   Marland Kitchen Marital Status:     Allergies:  Allergies  Allergen Reactions  . Sulfacetamide Sodium Swelling    Diarrhea,dizziness, tongue swelling  . Other Other (See Comments) and Swelling    PT states that anything with the sides effects "may cause rash" she cannot take due to her psoriasis, it will make the patches on her  hand so thick she can't use her hands.  Diarrhea,dizziness, tongue swelling  . Sulfa Antibiotics Swelling    Diarrhea,dizziness, tongue swelling  . Abilify [Aripiprazole] Other (See Comments)  . Cephalosporins Rash  . Erythromycin Other (See Comments)    Abd. pain Abd. pain Other reaction(s): Other (See Comments) Abd. pain  . Lamotrigine Rash and Other (See Comments)    psorasis worsen psorasis worsen  . Risperidone Other (See Comments)    tremors tremors tremors    Metabolic Disorder Labs: Lab Results  Component Value Date   HGBA1C 5.3 08/24/2018  MPG 105.41 08/24/2018   MPG 105.41 04/14/2017   Lab Results  Component Value Date   PROLACTIN 9.3 08/24/2018   PROLACTIN 12.5 04/14/2017   Lab Results  Component Value Date   CHOL 190 08/24/2018   TRIG 185 (H) 08/24/2018   HDL 46 08/24/2018   CHOLHDL 4.1 08/24/2018   VLDL 37 08/24/2018   LDLCALC 107 (H) 08/24/2018   LDLCALC 122 (H) 04/14/2017   Lab Results  Component Value Date   TSH 2.376 04/14/2017   TSH 2.940 06/15/2016    Therapeutic Level Labs: Lab Results  Component Value Date   LITHIUM 0.34 (L) 04/14/2017   LITHIUM 0.3 (L) 06/15/2016   Lab Results  Component Value Date   VALPROATE 32 (L) 08/24/2018   VALPROATE 15 (L) 05/11/2018   No components found for:  CBMZ  Current Medications: Current Outpatient Medications  Medication Sig Dispense Refill  . ADVAIR DISKUS 250-50 MCG/DOSE AEPB SMARTSIG:2 Puff(s) By Mouth Twice Daily    . ALPRAZolam (XANAX) 0.25 MG tablet Take 0.25 mg by mouth at bedtime.  0  . cyclobenzaprine (FLEXERIL) 5 MG tablet Take 5 mg by mouth 2 (two) times daily at 10 AM and 5 PM.    . divalproex (DEPAKOTE) 250 MG DR tablet Take 1 tablet (250 mg total) by mouth 3 (three) times daily. 270 tablet 0  . KLOR-CON M20 20 MEQ tablet Take 20 mEq by mouth 2 (two) times daily.  4  . megestrol (MEGACE) 400 MG/10ML suspension Take 10 mLs (400 mg total) by mouth daily. 240 mL 0  . ondansetron  (ZOFRAN-ODT) 4 MG disintegrating tablet     . pantoprazole (PROTONIX) 40 MG tablet     . Potassium Chloride ER 20 MEQ TBCR     . propranolol (INDERAL) 20 MG tablet Take 1 tablet (20 mg total) by mouth 3 (three) times daily. 270 tablet 0  . QUEtiapine (SEROQUEL) 50 MG tablet Take 1 tablet (50 mg total) by mouth at bedtime as needed. 90 tablet 0   No current facility-administered medications for this visit.     Musculoskeletal: Strength & Muscle Tone: UTA Gait & Station: normal Patient leans: N/A  Psychiatric Specialty Exam: Review of Systems  Psychiatric/Behavioral: Negative for agitation, behavioral problems, confusion, decreased concentration, dysphoric mood, hallucinations, self-injury, sleep disturbance and suicidal ideas. The patient is not nervous/anxious and is not hyperactive.   All other systems reviewed and are negative.   Last menstrual period 02/09/2008.There is no height or weight on file to calculate BMI.  General Appearance: Casual  Eye Contact:  Fair  Speech:  Clear and Coherent  Volume:  Normal  Mood:  Euthymic  Affect:  Appropriate  Thought Process:  Goal Directed and Descriptions of Associations: Intact  Orientation:  Full (Time, Place, and Person)  Thought Content: Logical   Suicidal Thoughts:  No  Homicidal Thoughts:  No  Memory:  Immediate;   Fair Recent;   Fair Remote;   Fair  Judgement:  Fair  Insight:  Fair  Psychomotor Activity:  Normal  Concentration:  Concentration: Fair and Attention Span: Fair  Recall:  [employer] of Knowledge: Fair  Language: Fair  Akathisia:  No  Handed:  Right  AIMS (if indicated): UTA  Assets:  Communication Skills Desire for Improvement Housing Social Support  ADL's:  Intact  Cognition: WNL  Sleep:  Fair   Screenings: AIMS     Office Visit from 05/03/2016 in [facility] Admission (Discharged) from 01/15/2015 in [facility]  BEHAVIORAL MEDICINE  AIMS Total Score  0  0    AUDIT      Admission (Discharged) from 01/15/2015 in [facility]  Alcohol Use Disorder Identification Test Final Score (AUDIT)  1    GAD-7     Office Visit from 04/18/2015 in [facility]  Total GAD-7 Score  14    PHQ2-9     Office Visit from 04/18/2015 in [facility] Office Visit from 07/11/2014 in [facility]  PHQ-2 Total Score  4  0  PHQ-9 Total Score  14  --       Assessment and Plan: Lauramae is a 59 year old Caucasian female who has a history of bipolar disorder, cannabis use disorder, psoriasis, arthritis was evaluated by telemedicine today.  Patient is currently stable on current medication regimen.  Patient encouraged to get her Depakote level and other labs done.  Plan as noted below.  Plan Bipolar disorder in remission Depakote 250 mg p.o. 3 times daily Depakote level on 08/24/2018-32-subtherapeutic.  However will not make any changes since she is stable. Seroquel 50 mg p.o. nightly as needed  Anxiety disorder-stable Propranolol 10 mg p.o. 3 times daily.  Insomnia-stable Seroquel 50 mg p.o. nightly as needed  Cannabis use disorder in early remission Will monitor closely  Tobacco use disorder-improving Provided smoking cessation counseling.  High risk medication use-patient encouraged to get Depakote level, CMP done.  Follow-up in clinic in 2 months or sooner if needed.  I have spent atleast 20 minutes non face to face with patient today. More than 50 % of the time was spent for preparing to see the patient ( e.g., review of test, records ),  ordering medications and test ,psychoeducation and supportive psychotherapy and care coordination,as well as documenting clinical information in electronic health record. This note was generated in part or whole with voice recognition software. Voice recognition is usually quite accurate but there are transcription errors that can and very often do occur. I apologize for any  typographical errors that were not detected and corrected.       Ursula Alert, MD 05/24/2019, 6:30 PM

## 2019-07-26 ENCOUNTER — Telehealth (INDEPENDENT_AMBULATORY_CARE_PROVIDER_SITE_OTHER): Payer: 59 | Admitting: Psychiatry

## 2019-07-26 ENCOUNTER — Other Ambulatory Visit: Payer: Self-pay

## 2019-07-26 ENCOUNTER — Ambulatory Visit: Payer: Self-pay | Admitting: Internal Medicine

## 2019-07-26 DIAGNOSIS — Z5329 Procedure and treatment not carried out because of patient's decision for other reasons: Secondary | ICD-10-CM

## 2019-07-26 NOTE — Progress Notes (Signed)
Patient picked up call and said she cannot do this appointment at this time.

## 2019-10-06 LAB — CMP AND LIVER
ALT: 15 IU/L (ref 0–32)
AST: 23 IU/L (ref 0–40)
Albumin: 4.7 g/dL (ref 3.8–4.9)
Alkaline Phosphatase: 83 IU/L (ref 48–121)
BUN: 7 mg/dL (ref 6–24)
Bilirubin Total: 0.3 mg/dL (ref 0.0–1.2)
Bilirubin, Direct: 0.11 mg/dL (ref 0.00–0.40)
CO2: 25 mmol/L (ref 20–29)
Calcium: 9.6 mg/dL (ref 8.7–10.2)
Chloride: 103 mmol/L (ref 96–106)
Creatinine, Ser: 0.89 mg/dL (ref 0.57–1.00)
GFR calc Af Amer: 83 mL/min/{1.73_m2} (ref >59)
GFR calc non Af Amer: 72 mL/min/{1.73_m2} (ref >59)
Glucose: 88 mg/dL (ref 65–99)
Potassium: 3.7 mmol/L (ref 3.5–5.2)
Sodium: 140 mmol/L (ref 134–144)
Total Protein: 7.1 g/dL (ref 6.0–8.5)

## 2019-10-06 LAB — CBC WITH DIFFERENTIAL
Basophils Absolute: 0 10*3/uL (ref 0.0–0.2)
Basos: 0 %
EOS (ABSOLUTE): 0.1 10*3/uL (ref 0.0–0.4)
Eos: 2 %
Hematocrit: 37.7 % (ref 34.0–46.6)
Hemoglobin: 12.5 g/dL (ref 11.1–15.9)
Immature Grans (Abs): 0 10*3/uL (ref 0.0–0.1)
Immature Granulocytes: 0 %
Lymphocytes Absolute: 2.3 10*3/uL (ref 0.7–3.1)
Lymphs: 33 %
MCH: 32 pg (ref 26.6–33.0)
MCHC: 33.2 g/dL (ref 31.5–35.7)
MCV: 96 fL (ref 79–97)
Monocytes Absolute: 0.6 10*3/uL (ref 0.1–0.9)
Monocytes: 8 %
Neutrophils Absolute: 4.1 10*3/uL (ref 1.4–7.0)
Neutrophils: 57 %
RBC: 3.91 x10E6/uL (ref 3.77–5.28)
RDW: 12.9 % (ref 11.7–15.4)
WBC: 7.2 10*3/uL (ref 3.4–10.8)

## 2019-10-06 LAB — VALPROIC ACID LEVEL: Valproic Acid Lvl: 31 ug/mL — ABNORMAL LOW (ref 50–100)

## 2019-11-07 ENCOUNTER — Telehealth (INDEPENDENT_AMBULATORY_CARE_PROVIDER_SITE_OTHER): Payer: 59 | Admitting: Psychiatry

## 2019-11-07 ENCOUNTER — Encounter: Payer: Self-pay | Admitting: Psychiatry

## 2019-11-07 ENCOUNTER — Other Ambulatory Visit: Payer: Self-pay

## 2019-11-07 DIAGNOSIS — Z79899 Other long term (current) drug therapy: Secondary | ICD-10-CM

## 2019-11-07 DIAGNOSIS — F17200 Nicotine dependence, unspecified, uncomplicated: Secondary | ICD-10-CM | POA: Diagnosis not present

## 2019-11-07 DIAGNOSIS — F603 Borderline personality disorder: Secondary | ICD-10-CM

## 2019-11-07 DIAGNOSIS — F1221 Cannabis dependence, in remission: Secondary | ICD-10-CM | POA: Diagnosis not present

## 2019-11-07 DIAGNOSIS — F3178 Bipolar disorder, in full remission, most recent episode mixed: Secondary | ICD-10-CM | POA: Diagnosis not present

## 2019-11-07 MED ORDER — PROPRANOLOL HCL 20 MG PO TABS: 20.0000 mg | ORAL_TABLET | Freq: Three times a day (TID) | ORAL | 1 refills | Status: DC

## 2019-11-07 MED ORDER — DIVALPROEX SODIUM 250 MG PO DR TAB: 250.0000 mg | DELAYED_RELEASE_TABLET | Freq: Three times a day (TID) | ORAL | 1 refills | Status: DC

## 2019-11-07 MED ORDER — QUETIAPINE FUMARATE 50 MG PO TABS: 50.0000 mg | ORAL_TABLET | Freq: Every evening | ORAL | 0 refills | Status: DC | PRN

## 2019-11-07 NOTE — Progress Notes (Signed)
Provider Location : [facility] Patient Location : Home  Participants: Patient , Provider  Virtual Visit via Video Note  I connected with Sheryl Perez on 11/07/19 at  4:00 PM EDT by a video enabled telemedicine application and verified that I am speaking with the correct person using two identifiers.   I discussed the limitations of evaluation and management by telemedicine and the availability of in person appointments. The patient expressed understanding and agreed to proceed.     I discussed the assessment and treatment plan with the patient. The patient was provided an opportunity to ask questions and all were answered. The patient agreed with the plan and demonstrated an understanding of the instructions.   The patient was advised to call back or seek an in-person evaluation if the symptoms worsen or if the condition fails to improve as anticipated.   [place] MD OP Progress Note  11/07/2019 6:20 PM Sheryl Perez  MRN:  [id number]  Chief Complaint:  Chief Complaint    Follow-up     HPI: Sheryl Perez is a 59 year old Caucasian female who has a history of bipolar disorder, borderline personality disorder, cannabis use disorder in early remission, tobacco use disorder was evaluated by telemedicine today.  Patient today reports she is currently busy at work.  She works 6 days a week.  She reports that does keep her occupied.  She enjoys working.  She denies any mood lability.  Denies any anxiety or depression.  She reports sleep is good.  She rarely takes her Seroquel.  She is compliant on the Depakote and the propranolol as needed.  Discussed and reviewed most recent Depakote level and other labs with patient.  She currently denies any suicidality, homicidality or perceptual disturbances.  She is cutting back on smoking since she stays busy at work.  She is motivated to cut back more.  Patient denies any other concerns today.  Visit Diagnosis:    ICD-10-CM   1. Bipolar  disorder, in full remission, most recent episode mixed (HCC)  F31.78 divalproex (DEPAKOTE) 250 MG DR tablet    propranolol (INDERAL) 20 MG tablet    QUEtiapine (SEROQUEL) 50 MG tablet  2. Tobacco use disorder  F17.200   3. Borderline personality disorder ([place])  F60.3   4. Cannabis use disorder, moderate, in early remission ([place])  F12.21   5. High risk medication use  Z79.899     Past Psychiatric History: I have reviewed past psychiatric history from my progress note on 04/12/2017.  Past trials of risperidone, Abilify, Lamictal, lithium, Prozac, trazodone  Past Medical History:  Past Medical History:  Diagnosis Date  . Back injury   . 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 Bipolar disorder ([place])   . Chest pain, unspecified   . Chronic low back pain 10/23/2013  . Chronic UTI   . Depression   . Dysfunction of eustachian tube   . Enlargement of lymph nodes   . History of Bell's palsy Right  . Injury, other and unspecified, knee, leg, ankle, and foot   . MRSA (methicillin resistant staph aureus) culture positive 07-11-14   abscess  . Other specified disease of hair and hair follicles   . Rectal prolapse   . Unspecified symptom associated with female genital organs     Past Surgical History:  Procedure Laterality Date  . ABDOMINAL HYSTERECTOMY    . bladder tack  2010  . CHOLECYSTECTOMY  2002  . FOOT SURGERY Bilateral 2004  bunion  . GANGLION CYST EXCISION  2002  . INCISION AND DRAINAGE ABSCESS  04-16-15  . PARTIAL HYSTERECTOMY  2010   uterus removed    Family Psychiatric History: I have reviewed family psychiatric history from my progress note on 04/12/2017  Family History:  Family History  Problem Relation Age of Onset  . Parkinson's disease Maternal Grandfather   . Diabetes Father   . Cancer Father        brain tumor  . Depression Father   . Diabetes Paternal Grandfather   . Colon polyps Mother   . Heart disease Maternal Grandmother   . Heart attack  Maternal Grandmother   . Stroke Maternal Grandmother   . Depression Sister   . Alcohol abuse Brother   . Depression Brother   . Breast cancer Paternal Aunt   . Breast cancer Cousin     Social History: Reviewed social history from my progress note on 04/12/2017 Social History   Socioeconomic History  . Marital status: Married    Spouse name: Not on file  . Number of children: 3  . Years of education: hs  . Highest education level: Some college, no degree  Occupational History  . Not on file  Tobacco Use  . Smoking status: Current Every Day Smoker    Packs/day: 0.50    Years: 40.00    Pack years: 20.00    Types: Cigarettes  . Smokeless tobacco: Never Used  . Tobacco comment: reported on 11/07/2019  Vaping Use  . Vaping Use: Never used  Substance and Sexual Activity  . Alcohol use: No    Alcohol/week: 1.0 standard drink    Types: 1 Glasses of wine per week    Comment: occasional  . Drug use: Yes    Types: Marijuana    Comment: last used 05-20-15  . Sexual activity: Yes    Birth control/protection: None  Other Topics Concern  . Not on file  Social History Narrative   Patient lives at home with her husband [name]).   Patient works full time.   Education CNA    Right handed.   Caffeine Tea , Soda.   Social Determinants of Health   Financial Resource Strain:   . Difficulty of Paying Living Expenses: Not on file  Food Insecurity:   . Worried About Charity fundraiser in the Last Year: Not on file  . Ran Out of Food in the Last Year: Not on file  Transportation Needs:   . Lack of Transportation (Medical): Not on file  . Lack of Transportation (Non-Medical): Not on file  Physical Activity:   . Days of Exercise per Week: Not on file  . Minutes of Exercise per Session: Not on file  Stress:   . Feeling of Stress : Not on file  Social Connections:   . Frequency of Communication with Friends and Family: Not on file  . Frequency of Social Gatherings with Friends and  Family: Not on file  . Attends Religious Services: Not on file  . Active Member of Clubs or Organizations: Not on file  . Attends Archivist Meetings: Not on file  . Marital Status: Not on file    Allergies:  Allergies  Allergen Reactions  . Sulfacetamide Sodium Swelling    Diarrhea,dizziness, tongue swelling  . Other Other (See Comments) and Swelling    PT states that anything with the sides effects "may cause rash" she cannot take due to her psoriasis, it will make the patches  on her hand so thick she can't use her hands.  Diarrhea,dizziness, tongue swelling  . Sulfa Antibiotics Swelling    Diarrhea,dizziness, tongue swelling  . Abilify [Aripiprazole] Other (See Comments)  . Cephalosporins Rash  . Erythromycin Other (See Comments)    Abd. pain Abd. pain Other reaction(s): Other (See Comments) Abd. pain  . Lamotrigine Rash and Other (See Comments)    psorasis worsen psorasis worsen  . Risperidone Other (See Comments)    tremors tremors tremors    Metabolic Disorder Labs: Lab Results  Component Value Date   HGBA1C 5.3 08/24/2018   MPG 105.41 08/24/2018   MPG 105.41 04/14/2017   Lab Results  Component Value Date   PROLACTIN 9.3 08/24/2018   PROLACTIN 12.5 04/14/2017   Lab Results  Component Value Date   CHOL 190 08/24/2018   TRIG 185 (H) 08/24/2018   HDL 46 08/24/2018   CHOLHDL 4.1 08/24/2018   VLDL 37 08/24/2018   LDLCALC 107 (H) 08/24/2018   LDLCALC 122 (H) 04/14/2017   Lab Results  Component Value Date   TSH 2.376 04/14/2017   TSH 2.940 06/15/2016    Therapeutic Level Labs: Lab Results  Component Value Date   LITHIUM 0.34 (L) 04/14/2017   LITHIUM 0.3 (L) 06/15/2016   Lab Results  Component Value Date   VALPROATE 31 (L) 10/05/2019   VALPROATE 32 (L) 08/24/2018   No components found for:  CBMZ  Current Medications: Current Outpatient Medications  Medication Sig Dispense Refill  . ADVAIR DISKUS 250-50 MCG/DOSE AEPB SMARTSIG:2  Puff(s) By Mouth Twice Daily    . ALPRAZolam (XANAX) 0.25 MG tablet Take 0.25 mg by mouth at bedtime.  0  . Ascorbic Acid (VITAMIN C) 1000 MG tablet Take 1,000 mg by mouth daily.    . divalproex (DEPAKOTE) 250 MG DR tablet Take 1 tablet (250 mg total) by mouth 3 (three) times daily. 270 tablet 1  . Multiple Vitamin (MULTIVITAMIN) tablet Take 1 tablet by mouth daily.    . ondansetron (ZOFRAN-ODT) 4 MG disintegrating tablet     . propranolol (INDERAL) 20 MG tablet Take 1 tablet (20 mg total) by mouth 3 (three) times daily. 270 tablet 1  . QUEtiapine (SEROQUEL) 50 MG tablet Take 1 tablet (50 mg total) by mouth at bedtime as needed. 90 tablet 0  . amoxicillin (AMOXIL) 500 MG capsule Take 500 mg by mouth 3 (three) times daily. (Patient not taking: Reported on 11/07/2019)    . cyclobenzaprine (FLEXERIL) 5 MG tablet Take 5 mg by mouth 2 (two) times daily at 10 AM and 5 PM. (Patient not taking: Reported on 11/07/2019)    . KLOR-CON M20 20 MEQ tablet Take 20 mEq by mouth 2 (two) times daily. (Patient not taking: Reported on 11/07/2019)  4  . megestrol (MEGACE) 400 MG/10ML suspension Take 10 mLs (400 mg total) by mouth daily. (Patient not taking: Reported on 11/07/2019) 240 mL 0  . pantoprazole (PROTONIX) 40 MG tablet  (Patient not taking: Reported on 11/07/2019)    . Potassium Chloride ER 20 MEQ TBCR  (Patient not taking: Reported on 11/07/2019)     No current facility-administered medications for this visit.     Musculoskeletal: Strength & Muscle Tone: UTA Gait & Station: normal Patient leans: N/A  Psychiatric Specialty Exam: Review of Systems  Psychiatric/Behavioral: Negative for agitation, behavioral problems, confusion, decreased concentration, dysphoric mood, hallucinations, self-injury, sleep disturbance and suicidal ideas. The patient is not nervous/anxious and is not hyperactive.   All other systems reviewed and  are negative.   Last menstrual period 02/09/2008.There is no height or weight on  file to calculate BMI.  General Appearance: Casual  Eye Contact:  Fair  Speech:  Clear and Coherent  Volume:  Normal  Mood:  Euthymic  Affect:  Congruent  Thought Process:  Goal Directed and Descriptions of Associations: Intact  Orientation:  Full (Time, Place, and Person)  Thought Content: Logical   Suicidal Thoughts:  No  Homicidal Thoughts:  No  Memory:  Immediate;   Fair Recent;   Fair Remote;   Fair  Judgement:  Fair  Insight:  Fair  Psychomotor Activity:  Normal  Concentration:  Concentration: Fair and Attention Span: Fair  Recall:  [employer] of Knowledge: Fair  Language: Fair  Akathisia:  No  Handed:  Right  AIMS (if indicated): UTA  Assets:  Communication Skills Desire for Improvement Housing Social Support  ADL's:  Intact  Cognition: WNL  Sleep:  Fair   Screenings: AIMS     Office Visit from 05/03/2016 in [facility] Admission (Discharged) from 01/15/2015 in [facility] Total Score 0 0    AUDIT     Admission (Discharged) from 01/15/2015 in [facility]  Alcohol Use Disorder Identification Test Final Score (AUDIT) 1    GAD-7     Office Visit from 04/18/2015 in [facility]  Total GAD-7 Score 14    PHQ2-9     Office Visit from 04/18/2015 in [facility] Office Visit from 07/11/2014 in [facility]  PHQ-2 Total Score 4 0  PHQ-9 Total Score 14 --       Assessment and Plan: Sheryl Perez is a 59 year old Caucasian female who has a history of bipolar disorder, cannabis use disorder, tobacco use disorder, psoriasis, arthritis was evaluated by telemedicine today.  Patient is currently stable on current medication regimen.  Plan as noted below.  Plan Bipolar disorder in remission Depakote 250 mg p.o. 3 times daily I have reviewed and discussed Depakote level-dated 10/05/2019-31-subtherapeutic.  However patient symptoms are currently stable  and the levels are similar to what was reported a year ago.  Will not make any changes today. Seroquel 50 mg p.o. nightly as needed  Anxiety disorder-stable Propranolol 10 mg p.o. 3 times daily  Insomnia-stable Seroquel 50 mg p.o. nightly as needed  Cannabis use disorder in early remission We will monitor closely  Tobacco use disorder-improving Provided counseling, she is cutting back.   High risk medication use-reviewed Depakote level as noted above, CMP and liver-dated 10/05/2019-within normal limits, CBC with differential-within normal limits, however platelet count not reported.  Patient currently denies any symptoms or adverse side effects.  Will monitor closely.  Follow-up in clinic in 3 months or sooner if needed.  I have spent atleast 20 minutes face to face with patient today. More than 50 % of the time was spent for preparing to see the patient ( e.g., review of test, records ),  ordering medications and test ,psychoeducation and supportive psychotherapy and care coordination,as well as documenting clinical information in electronic health record,interpreting results of test and communication of results. This note was generated in part or whole with voice recognition software. Voice recognition is usually quite accurate but there are transcription errors that can and very often do occur. I apologize for any typographical errors that were not detected and corrected.     Ursula Alert, MD 11/07/2019, 6:20 PM

## 2019-12-12 ENCOUNTER — Other Ambulatory Visit: Payer: Self-pay

## 2019-12-12 ENCOUNTER — Other Ambulatory Visit (INDEPENDENT_AMBULATORY_CARE_PROVIDER_SITE_OTHER): Payer: Self-pay

## 2019-12-12 DIAGNOSIS — F3111 Bipolar disorder, current episode manic without psychotic features, mild: Secondary | ICD-10-CM

## 2019-12-12 DIAGNOSIS — I10 Essential (primary) hypertension: Secondary | ICD-10-CM

## 2019-12-12 DIAGNOSIS — E785 Hyperlipidemia, unspecified: Secondary | ICD-10-CM

## 2019-12-13 LAB — TSH: TSH: 1.27 mIU/L (ref 0.40–4.50)

## 2019-12-13 LAB — CBC WITH DIFFERENTIAL/PLATELET
Absolute Monocytes: 549 cells/uL (ref 200–950)
Basophils Absolute: 41 cells/uL (ref 0–200)
Basophils Relative: 0.5 %
Eosinophils Absolute: 180 cells/uL (ref 15–500)
Eosinophils Relative: 2.2 %
HCT: 38.8 % (ref 35.0–45.0)
Hemoglobin: 12.8 g/dL (ref 11.7–15.5)
Lymphs Abs: 1509 cells/uL (ref 850–3900)
MCH: 32.5 pg (ref 27.0–33.0)
MCHC: 33 g/dL (ref 32.0–36.0)
MCV: 98.5 fL (ref 80.0–100.0)
MPV: 12.5 fL (ref 7.5–12.5)
Monocytes Relative: 6.7 %
Neutro Abs: 5920 cells/uL (ref 1500–7800)
Neutrophils Relative %: 72.2 %
Platelets: 289 10*3/uL (ref 140–400)
RBC: 3.94 10*6/uL (ref 3.80–5.10)
RDW: 12.6 % (ref 11.0–15.0)
Total Lymphocyte: 18.4 %
WBC: 8.2 10*3/uL (ref 3.8–10.8)

## 2019-12-13 LAB — LIPID PANEL
Cholesterol: 197 mg/dL (ref <200)
HDL: 51 mg/dL (ref >=50)
LDL Cholesterol (Calc): 123 mg/dL (calc) — ABNORMAL HIGH
Non-HDL Cholesterol (Calc): 146 mg/dL (calc) — ABNORMAL HIGH (ref <130)
Total CHOL/HDL Ratio: 3.9 (calc) (ref <5.0)
Triglycerides: 121 mg/dL (ref <150)

## 2019-12-13 LAB — COMPLETE METABOLIC PANEL WITH GFR
AG Ratio: 1.6 (calc) (ref 1.0–2.5)
ALT: 18 U/L (ref 6–29)
AST: 16 U/L (ref 10–35)
Albumin: 4.3 g/dL (ref 3.6–5.1)
Alkaline phosphatase (APISO): 68 U/L (ref 37–153)
BUN: 11 mg/dL (ref 7–25)
CO2: 27 mmol/L (ref 20–32)
Calcium: 9.9 mg/dL (ref 8.6–10.4)
Chloride: 102 mmol/L (ref 98–110)
Creat: 0.88 mg/dL (ref 0.50–1.05)
GFR, Est African American: 83 mL/min/{1.73_m2} (ref >=60)
GFR, Est Non African American: 72 mL/min/{1.73_m2} (ref >=60)
Globulin: 2.7 g/dL (calc) (ref 1.9–3.7)
Glucose, Bld: 92 mg/dL (ref 65–99)
Potassium: 4.3 mmol/L (ref 3.5–5.3)
Sodium: 140 mmol/L (ref 135–146)
Total Bilirubin: 0.5 mg/dL (ref 0.2–1.2)
Total Protein: 7 g/dL (ref 6.1–8.1)

## 2019-12-14 ENCOUNTER — Other Ambulatory Visit: Payer: Self-pay

## 2019-12-14 ENCOUNTER — Encounter: Payer: Self-pay | Admitting: Family Medicine

## 2019-12-14 ENCOUNTER — Ambulatory Visit (INDEPENDENT_AMBULATORY_CARE_PROVIDER_SITE_OTHER): Payer: 59 | Admitting: Family Medicine

## 2019-12-14 VITALS — BP 131/76 | HR 64 | Ht 60.0 in | Wt 119.2 lb

## 2019-12-14 DIAGNOSIS — Z23 Encounter for immunization: Secondary | ICD-10-CM | POA: Diagnosis not present

## 2019-12-14 DIAGNOSIS — Z0000 Encounter for general adult medical examination without abnormal findings: Secondary | ICD-10-CM

## 2019-12-14 DIAGNOSIS — S40811A Abrasion of right upper arm, initial encounter: Secondary | ICD-10-CM

## 2019-12-14 DIAGNOSIS — F17200 Nicotine dependence, unspecified, uncomplicated: Secondary | ICD-10-CM

## 2019-12-14 DIAGNOSIS — Z1231 Encounter for screening mammogram for malignant neoplasm of breast: Secondary | ICD-10-CM

## 2019-12-14 NOTE — Assessment & Plan Note (Addendum)
Mammo- Scheduled this year Low Dose CT Chest - Will schedule today Eye Exam- Will schedule Herself TDAP- Will Give today Last Pap- Partial Hysterectomy 2010

## 2019-12-14 NOTE — Progress Notes (Signed)
Established Patient Office Visit  SUBJECTIVE:  Subjective  Patient ID: Sheryl Perez, female    DOB: 1960-06-21  Age: 59 y.o. MRN: [id number]  CC:  Chief Complaint  Patient presents with  . Annual Exam    HPI Sheryl Perez is a 59 y.o. female presenting today for annual exam.   Past Medical History:  Diagnosis Date  . Back injury   . 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 Bipolar disorder ([place])   . Chest pain, unspecified   . Chronic low back pain 10/23/2013  . Chronic UTI   . Depression   . Dysfunction of eustachian tube   . Enlargement of lymph nodes   . History of Bell's palsy Right  . Injury, other and unspecified, knee, leg, ankle, and foot   . MRSA (methicillin resistant staph aureus) culture positive 07-11-14   abscess  . Other specified disease of hair and hair follicles   . Rectal prolapse   . Unspecified symptom associated with female genital organs     Past Surgical History:  Procedure Laterality Date  . ABDOMINAL HYSTERECTOMY    . bladder tack  2010  . CHOLECYSTECTOMY  2002  . FOOT SURGERY Bilateral 2004   bunion  . GANGLION CYST EXCISION  2002  . INCISION AND DRAINAGE ABSCESS  04-16-15  . PARTIAL HYSTERECTOMY  2010   uterus removed    Family History  Problem Relation Age of Onset  . Parkinson's disease Maternal Grandfather   . Diabetes Father   . Cancer Father        brain tumor  . Depression Father   . Diabetes Paternal Grandfather   . Colon polyps Mother   . Heart disease Maternal Grandmother   . Heart attack Maternal Grandmother   . Stroke Maternal Grandmother   . Depression Sister   . Alcohol abuse Brother   . Depression Brother   . Breast cancer Paternal Aunt   . Breast cancer Cousin     Social History   Socioeconomic History  . Marital status: Married    Spouse name: Not on file  . Number of children: 3  . Years of education: hs  . Highest education level: Some college, no degree  Occupational History  .  Not on file  Tobacco Use  . Smoking status: Current Every Day Smoker    Packs/day: 0.50    Years: 40.00    Pack years: 20.00    Types: Cigarettes  . Smokeless tobacco: Never Used  . Tobacco comment: reported on 11/07/2019  Vaping Use  . Vaping Use: Never used  Substance and Sexual Activity  . Alcohol use: No    Alcohol/week: 1.0 standard drink    Types: 1 Glasses of wine per week    Comment: occasional  . Drug use: Yes    Types: Marijuana    Comment: last used 05-20-15  . Sexual activity: Yes    Birth control/protection: None  Other Topics Concern  . Not on file  Social History Narrative   Patient lives at home with her husband [name]).   Patient works full time.   Education CNA    Right handed.   Caffeine Tea , Soda.   Social Determinants of Health   Financial Resource Strain:   . Difficulty of Paying Living Expenses: Not on file  Food Insecurity:   . Worried About Charity fundraiser in the Last Year: Not on file  . Ran Out of  Food in the Last Year: Not on file  Transportation Needs:   . Lack of Transportation (Medical): Not on file  . Lack of Transportation (Non-Medical): Not on file  Physical Activity:   . Days of Exercise per Week: Not on file  . Minutes of Exercise per Session: Not on file  Stress:   . Feeling of Stress : Not on file  Social Connections:   . Frequency of Communication with Friends and Family: Not on file  . Frequency of Social Gatherings with Friends and Family: Not on file  . Attends Religious Services: Not on file  . Active Member of Clubs or Organizations: Not on file  . Attends Archivist Meetings: Not on file  . Marital Status: Not on file  Intimate Partner Violence:   . Fear of Current or Ex-Partner: Not on file  . Emotionally Abused: Not on file  . Physically Abused: Not on file  . Sexually Abused: Not on file     Current Outpatient Medications:  .  ADVAIR DISKUS 250-50 MCG/DOSE AEPB, SMARTSIG:2 Puff(s) By Mouth Twice  Daily, Disp: , Rfl:  .  ALPRAZolam (XANAX) 0.25 MG tablet, Take 0.25 mg by mouth at bedtime., Disp: , Rfl: 0 .  amoxicillin (AMOXIL) 500 MG capsule, Take 500 mg by mouth 3 (three) times daily. , Disp: , Rfl:  .  Ascorbic Acid (VITAMIN C) 1000 MG tablet, Take 1,000 mg by mouth daily., Disp: , Rfl:  .  cyclobenzaprine (FLEXERIL) 5 MG tablet, Take 5 mg by mouth 2 (two) times daily at 10 AM and 5 PM. , Disp: , Rfl:  .  divalproex (DEPAKOTE) 250 MG DR tablet, Take 1 tablet (250 mg total) by mouth 3 (three) times daily., Disp: 270 tablet, Rfl: 1 .  KLOR-CON M20 20 MEQ tablet, Take 20 mEq by mouth 2 (two) times daily. , Disp: , Rfl: 4 .  megestrol (MEGACE) 400 MG/10ML suspension, Take 10 mLs (400 mg total) by mouth daily., Disp: 240 mL, Rfl: 0 .  Multiple Vitamin (MULTIVITAMIN) tablet, Take 1 tablet by mouth daily., Disp: , Rfl:  .  ondansetron (ZOFRAN-ODT) 4 MG disintegrating tablet, , Disp: , Rfl:  .  pantoprazole (PROTONIX) 40 MG tablet, , Disp: , Rfl:  .  Potassium Chloride ER 20 MEQ TBCR, , Disp: , Rfl:  .  propranolol (INDERAL) 20 MG tablet, Take 1 tablet (20 mg total) by mouth 3 (three) times daily., Disp: 270 tablet, Rfl: 1 .  QUEtiapine (SEROQUEL) 50 MG tablet, Take 1 tablet (50 mg total) by mouth at bedtime as needed., Disp: 90 tablet, Rfl: 0   Allergies  Allergen Reactions  . Sulfacetamide Sodium Swelling    Diarrhea,dizziness, tongue swelling  . Other Other (See Comments) and Swelling    PT states that anything with the sides effects "may cause rash" she cannot take due to her psoriasis, it will make the patches on her hand so thick she can't use her hands.  Diarrhea,dizziness, tongue swelling  . Sulfa Antibiotics Swelling    Diarrhea,dizziness, tongue swelling  . Abilify [Aripiprazole] Other (See Comments)  . Cephalosporins Rash  . Erythromycin Other (See Comments)    Abd. pain Abd. pain Other reaction(s): Other (See Comments) Abd. pain  . Lamotrigine Rash and Other (See  Comments)    psorasis worsen psorasis worsen  . Risperidone Other (See Comments)    tremors tremors tremors    ROS Review of Systems  HENT: Negative.   Respiratory: Negative.   Cardiovascular: Negative.  Genitourinary: Negative.   Musculoskeletal: Negative.   Psychiatric/Behavioral: Negative.   All other systems reviewed and are negative.    OBJECTIVE:    Physical Exam HENT:     Head: Normocephalic.     Right Ear: Tympanic membrane normal.     Left Ear: Tympanic membrane normal.     Nose: Nose normal.     Mouth/Throat:     Mouth: Mucous membranes are moist.  Eyes:     Pupils: Pupils are equal, round, and reactive to light.  Cardiovascular:     Rate and Rhythm: Normal rate and regular rhythm.     Pulses: Normal pulses.  Pulmonary:     Effort: Pulmonary effort is normal.  Abdominal:     General: Abdomen is flat.  Musculoskeletal:        General: Normal range of motion.     Cervical back: Normal range of motion.  Neurological:     Mental Status: She is alert.  Psychiatric:        Mood and Affect: Mood normal.     BP 131/76   Pulse 64   Ht 5' (1.524 m)   Wt 119 lb 3.2 oz (54.1 kg)   LMP 02/09/2008   BMI 23.28 kg/m  Wt Readings from Last 3 Encounters:  12/14/19 119 lb 3.2 oz (54.1 kg)  03/18/16 171 lb (77.6 kg)  06/03/15 176 lb (79.8 kg)    Health Maintenance Due  Topic Date Due  . Hepatitis C Screening  Never done  . COVID-19 Vaccine (1) Never done  . HIV Screening  Never done  . PAP SMEAR-Modifier  11/01/2016  . MAMMOGRAM  07/22/2017    There are no preventive care reminders to display for this patient.  CBC Latest Ref Rng & Units 12/12/2019 10/05/2019 06/15/2016  WBC 3.8 - 10.8 Thousand/uL 8.2 7.2 7.6  Hemoglobin 11.7 - 15.5 g/dL 12.8 12.5 12.2  Hematocrit 35 - 45 % 38.8 37.7 36.1  Platelets 140 - 400 Thousand/uL 289 - 343   CMP Latest Ref Rng & Units 12/12/2019 10/05/2019 08/24/2018  Glucose 65 - 99 mg/dL 92 88 108(H)  BUN 7 - 25 mg/dL 11 7 12    Creatinine 0.50 - 1.05 mg/dL 0.88 0.89 0.89  Sodium 135 - 146 mmol/L 140 140 138  Potassium 3.5 - 5.3 mmol/L 4.3 3.7 3.7  Chloride 98 - 110 mmol/L 102 103 104  CO2 20 - 32 mmol/L 27 25 24   Calcium 8.6 - 10.4 mg/dL 9.9 9.6 9.2  Total Protein 6.1 - 8.1 g/dL 7.0 7.1 7.4  Total Bilirubin 0.2 - 1.2 mg/dL 0.5 0.3 0.7  Alkaline Phos 48 - 121 IU/L - 83 71  AST 10 - 35 U/L 16 23 18   ALT 6 - 29 U/L 18 15 13     Lab Results  Component Value Date   TSH 1.27 12/12/2019   Lab Results  Component Value Date   ALBUMIN 4.7 10/05/2019   ANIONGAP 10 08/24/2018   Lab Results  Component Value Date   CHOL 197 12/12/2019   CHOL 190 08/24/2018   CHOL 219 (H) 04/14/2017   HDL 51 12/12/2019   HDL 46 08/24/2018   HDL 46 04/14/2017   LDLCALC 123 (H) 12/12/2019   LDLCALC 107 (H) 08/24/2018   LDLCALC 122 (H) 04/14/2017   CHOLHDL 3.9 12/12/2019   CHOLHDL 4.1 08/24/2018   CHOLHDL 4.8 04/14/2017   Lab Results  Component Value Date   TRIG 121 12/12/2019   Lab Results  Component Value  Date   HGBA1C 5.3 08/24/2018   HGBA1C 5.3 04/14/2017   HGBA1C 5.8 (H) 05/30/2015      ASSESSMENT & PLAN:   Problem List Items Addressed This Visit      Musculoskeletal and Integument   Abrasion of right arm    Works [employer] and is consistently stuck with wires, cop[per, will give TDAP today.        Relevant Orders   Tdap vaccine greater than or equal to 7yo IM (Completed)     Other   Tobacco use disorder    - The patient currently smokes regularly. - change or avoid triggers like smoky places, drinking alcohol and other smokers - use Quit [employer] Now       Relevant Orders   CT CHEST LUNG CA SCREEN LOW DOSE W/O CM   Annual physical exam    Mammo- Scheduled this year Low Dose CT Chest - Will schedule today Eye Exam- Will schedule Herself TDAP- Will Give today Last Pap- Partial Hysterectomy 2010       Relevant Orders   CT CHEST LUNG CA SCREEN LOW DOSE W/O CM    Other Visit Diagnoses     Encounter for screening mammogram for malignant neoplasm of breast    -  Primary   Relevant Orders   MM 3D SCREEN BREAST BILATERAL      No orders of the defined types were placed in this encounter.     Follow-up: No follow-ups on file.    Beckie Salts, [facility] [address] [place], [place] [address]

## 2019-12-14 NOTE — Assessment & Plan Note (Signed)
Works [employer] and is consistently stuck with wires, cop[per, will give TDAP today.

## 2019-12-28 NOTE — Assessment & Plan Note (Signed)
-   The patient currently smokes regularly. - change or avoid triggers like smoky places, drinking alcohol and other smokers - use Quit [employer] Now

## 2020-01-10 ENCOUNTER — Telehealth: Payer: Self-pay

## 2020-01-10 NOTE — Telephone Encounter (Signed)
Contacted patient for lung CT screening clinic based on referral from [facility].  Message left for patient to call Burgess Estelle, lung navigator to schedule CT scan.

## 2020-01-23 ENCOUNTER — Other Ambulatory Visit: Payer: Self-pay

## 2020-01-23 ENCOUNTER — Encounter: Payer: Self-pay | Admitting: Psychiatry

## 2020-01-23 ENCOUNTER — Telehealth (INDEPENDENT_AMBULATORY_CARE_PROVIDER_SITE_OTHER): Payer: 59 | Admitting: Psychiatry

## 2020-01-23 DIAGNOSIS — F603 Borderline personality disorder: Secondary | ICD-10-CM | POA: Diagnosis not present

## 2020-01-23 DIAGNOSIS — F1221 Cannabis dependence, in remission: Secondary | ICD-10-CM | POA: Diagnosis not present

## 2020-01-23 DIAGNOSIS — F3178 Bipolar disorder, in full remission, most recent episode mixed: Secondary | ICD-10-CM | POA: Diagnosis not present

## 2020-01-23 DIAGNOSIS — F17200 Nicotine dependence, unspecified, uncomplicated: Secondary | ICD-10-CM

## 2020-01-23 DIAGNOSIS — F17210 Nicotine dependence, cigarettes, uncomplicated: Secondary | ICD-10-CM | POA: Diagnosis not present

## 2020-01-23 NOTE — Progress Notes (Signed)
Virtual Visit via Video Note  I connected with Sheryl Perez on 01/23/20 at  4:20 PM EST by a video enabled telemedicine application and verified that I am speaking with the correct person using two identifiers.  Location Provider Location : [facility] Patient Location : Home  Participants: Patient , Provider   I discussed the limitations of evaluation and management by telemedicine and the availability of in person appointments. The patient expressed understanding and agreed to proceed.    I discussed the assessment and treatment plan with the patient. The patient was provided an opportunity to ask questions and all were answered. The patient agreed with the plan and demonstrated an understanding of the instructions.   The patient was advised to call back or seek an in-person evaluation if the symptoms worsen or if the condition fails to improve as anticipated.   Mitchell MD OP Progress Note  01/23/2020 4:41 PM Sheryl Perez  MRN:  [id number]  Chief Complaint:  Chief Complaint    Follow-up     HPI: Sheryl Perez is a 59 year old Caucasian female who has a history of bipolar disorder, borderline personality disorder, cannabis use disorder in early remission, tobacco use disorder was evaluated by telemedicine today.  Patient today reports she is currently doing well.  She reports she continues to enjoy her work.  She denies any mood swings.  She denies any irritability.  She reports sleep continues to be good.  She uses the Seroquel as needed only.  She uses it couple of times a month.  She does not like to use it during the days that she has to work since she feels groggy on it.  She is compliant on the Depakote and the propranolol as needed.  Patient denies any suicidality, homicidality or perceptual disturbances.  Patient denies any other concerns today.  Visit Diagnosis:    ICD-10-CM   1. Bipolar disorder, in full remission, most recent episode mixed ([place])  F31.78   2.  Tobacco use disorder  F17.200   3. Borderline personality disorder ([place])  F60.3   4. Cannabis use disorder, moderate, in early remission ([place])  F12.21     Past Psychiatric History: I have reviewed past psychiatric history from my progress note on 04/12/2017.  Past trials of risperidone, Abilify, Lamictal, lithium, Prozac, trazodone  Past Medical History:  Past Medical History:  Diagnosis Date  . Back injury   . 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 Bipolar disorder ([place])   . Chest pain, unspecified   . Chronic low back pain 10/23/2013  . Chronic UTI   . Depression   . Dysfunction of eustachian tube   . Enlargement of lymph nodes   . History of Bell's palsy Right  . Injury, other and unspecified, knee, leg, ankle, and foot   . MRSA (methicillin resistant staph aureus) culture positive 07-11-14   abscess  . Other specified disease of hair and hair follicles   . Rectal prolapse   . Unspecified symptom associated with female genital organs     Past Surgical History:  Procedure Laterality Date  . ABDOMINAL HYSTERECTOMY    . bladder tack  2010  . CHOLECYSTECTOMY  2002  . FOOT SURGERY Bilateral 2004   bunion  . GANGLION CYST EXCISION  2002  . INCISION AND DRAINAGE ABSCESS  04-16-15  . PARTIAL HYSTERECTOMY  2010   uterus removed    Family Psychiatric History: Reviewed family psychiatric history from my progress note on  04/12/2017   Family History:  Family History  Problem Relation Age of Onset  . Parkinson's disease Maternal Grandfather   . Diabetes Father   . Cancer Father        brain tumor  . Depression Father   . Diabetes Paternal Grandfather   . Colon polyps Mother   . Heart disease Maternal Grandmother   . Heart attack Maternal Grandmother   . Stroke Maternal Grandmother   . Depression Sister   . Alcohol abuse Brother   . Depression Brother   . Breast cancer Paternal Aunt   . Breast cancer Cousin     Social History: Reviewed social history from my  progress note on 04/12/2017 Social History   Socioeconomic History  . Marital status: Married    Spouse name: Not on file  . Number of children: 3  . Years of education: hs  . Highest education level: Some college, no degree  Occupational History  . Not on file  Tobacco Use  . Smoking status: Current Every Day Smoker    Packs/day: 0.50    Years: 40.00    Pack years: 20.00    Types: Cigarettes  . Smokeless tobacco: Never Used  . Tobacco comment: reported on 11/07/2019  Vaping Use  . Vaping Use: Never used  Substance and Sexual Activity  . Alcohol use: No    Alcohol/week: 1.0 standard drink    Types: 1 Glasses of wine per week    Comment: occasional  . Drug use: Yes    Types: Marijuana    Comment: last used 05-20-15  . Sexual activity: Yes    Birth control/protection: None  Other Topics Concern  . Not on file  Social History Narrative   Patient lives at home with her husband Sheryl Perez).   Patient works full time.   Education CNA    Right handed.   Caffeine Tea , Soda.   Social Determinants of Health   Financial Resource Strain: Not on file  Food Insecurity: Not on file  Transportation Needs: Not on file  Physical Activity: Not on file  Stress: Not on file  Social Connections: Not on file    Allergies:  Allergies  Allergen Reactions  . Sulfacetamide Sodium Swelling    Diarrhea,dizziness, tongue swelling  . Other Other (See Comments) and Swelling    PT states that anything with the sides effects "may cause rash" she cannot take due to her psoriasis, it will make the patches on her hand so thick she can't use her hands.  Diarrhea,dizziness, tongue swelling  . Sulfa Antibiotics Swelling    Diarrhea,dizziness, tongue swelling  . Abilify [Aripiprazole] Other (See Comments)  . Cephalosporins Rash  . Erythromycin Other (See Comments)    Abd. pain Abd. pain Other reaction(s): Other (See Comments) Abd. pain  . Lamotrigine Rash and Other (See Comments)    psorasis  worsen psorasis worsen  . Risperidone Other (See Comments)    tremors tremors tremors    Metabolic Disorder Labs: Lab Results  Component Value Date   HGBA1C 5.3 08/24/2018   MPG 105.41 08/24/2018   MPG 105.41 04/14/2017   Lab Results  Component Value Date   PROLACTIN 9.3 08/24/2018   PROLACTIN 12.5 04/14/2017   Lab Results  Component Value Date   CHOL 197 12/12/2019   TRIG 121 12/12/2019   HDL 51 12/12/2019   CHOLHDL 3.9 12/12/2019   VLDL 37 08/24/2018   LDLCALC 123 (H) 12/12/2019   LDLCALC 107 (H) 08/24/2018   Lab  Results  Component Value Date   TSH 1.27 12/12/2019   TSH 2.376 04/14/2017    Therapeutic Level Labs: Lab Results  Component Value Date   LITHIUM 0.34 (L) 04/14/2017   LITHIUM 0.3 (L) 06/15/2016   Lab Results  Component Value Date   VALPROATE 31 (L) 10/05/2019   VALPROATE 32 (L) 08/24/2018   No components found for:  CBMZ  Current Medications: Current Outpatient Medications  Medication Sig Dispense Refill  . ADVAIR DISKUS 250-50 MCG/DOSE AEPB SMARTSIG:2 Puff(s) By Mouth Twice Daily    . ALPRAZolam (XANAX) 0.25 MG tablet Take 0.25 mg by mouth at bedtime.  0  . amoxicillin (AMOXIL) 500 MG capsule Take 500 mg by mouth 3 (three) times daily.     . Ascorbic Acid (VITAMIN C) 1000 MG tablet Take 1,000 mg by mouth daily.    . cyclobenzaprine (FLEXERIL) 5 MG tablet Take 5 mg by mouth 2 (two) times daily at 10 AM and 5 PM.     . divalproex (DEPAKOTE) 250 MG DR tablet Take 1 tablet (250 mg total) by mouth 3 (three) times daily. 270 tablet 1  . KLOR-CON M20 20 MEQ tablet Take 20 mEq by mouth 2 (two) times daily.   4  . megestrol (MEGACE) 400 MG/10ML suspension Take 10 mLs (400 mg total) by mouth daily. 240 mL 0  . Multiple Vitamin (MULTIVITAMIN) tablet Take 1 tablet by mouth daily.    . ondansetron (ZOFRAN-ODT) 4 MG disintegrating tablet     . pantoprazole (PROTONIX) 40 MG tablet     . Potassium Chloride ER 20 MEQ TBCR     . propranolol (INDERAL) 20 MG  tablet Take 1 tablet (20 mg total) by mouth 3 (three) times daily. 270 tablet 1  . QUEtiapine (SEROQUEL) 50 MG tablet Take 1 tablet (50 mg total) by mouth at bedtime as needed. 90 tablet 0   No current facility-administered medications for this visit.     Musculoskeletal: Strength & Muscle Tone: UTA Gait & Station: normal Patient leans: N/A  Psychiatric Specialty Exam: Review of Systems  Psychiatric/Behavioral: Negative for agitation, behavioral problems, decreased concentration, dysphoric mood, hallucinations, self-injury, sleep disturbance and suicidal ideas. The patient is not hyperactive.   All other systems reviewed and are negative.   Last menstrual period 02/09/2008.There is no height or weight on file to calculate BMI.  General Appearance: Casual  Eye Contact:  Fair  Speech:  Clear and Coherent  Volume:  Normal  Mood:  Euthymic  Affect:  Congruent  Thought Process:  Goal Directed and Descriptions of Associations: Intact  Orientation:  Full (Time, Place, and Person)  Thought Content: Logical   Suicidal Thoughts:  No  Homicidal Thoughts:  No  Memory:  Immediate;   Fair Recent;   Fair Remote;   Fair  Judgement:  Fair  Insight:  Fair  Psychomotor Activity:  Normal  Concentration:  Concentration: Fair and Attention Span: Fair  Recall:  [employer] of Knowledge: Fair  Language: Fair  Akathisia:  No  Handed:  Right  AIMS (if indicated): UTA  Assets:  Communication Skills Desire for Improvement Housing Social Support  ADL's:  Intact  Cognition: WNL  Sleep:  Fair   Screenings: [place] Office Visit from 05/03/2016 in [facility] Admission (Discharged) from 01/15/2015 in [facility] Total Score 0 0    AUDIT   [place] Admission (Discharged) from 01/15/2015 in [facility]  Alcohol Use  Disorder Identification Test Final Score (AUDIT) 1    GAD-7   Flowsheet Row Office  Visit from 04/18/2015 in [facility]  Total GAD-7 Score 14    PHQ2-9   [place] Office Visit from 12/14/2019 in [facility] Office Visit from 04/18/2015 in [facility] Office Visit from 07/11/2014 in [facility]  PHQ-2 Total Score 0 4 0  PHQ-9 Total Score -- 14 --       Assessment and Plan: Sheryl Perez is a 59 year old Caucasian female who has a history of bipolar disorder, cannabis use disorder, tobacco use disorder, psoriasis, arthritis was evaluated by telemedicine today.  Patient is currently stable on current medication regimen.  Plan as noted below.  Plan Bipolar disorder in remission Depakote 250 mg p.o. 3 times daily. I have reviewed and discussed Depakote level-dated 10/05/2019-31-subtherapeutic. Patient however reports symptoms are stable. Seroquel 50 mg p.o. nightly as needed-she rarely uses it.  Anxiety disorder-stable Propranolol 10 mg p.o. 3 times daily as needed  Insomnia-stable Seroquel 50 mg p.o. nightly as needed  Cannabis use disorder in early remission She has been sober since the past 6 months  Tobacco use disorder-improving She is cutting back, provided counseling   Follow-up in clinic in 3 months or sooner if needed.  I have spent atleast 20 minutes face to face by video with patient today. More than 50 % of the time was spent for preparing to see the patient ( e.g., review of test, records ), ordering medications and test ,psychoeducation and supportive psychotherapy and care coordination,as well as documenting clinical information in electronic health record. This note was generated in part or whole with voice recognition software. Voice recognition is usually quite accurate but there are transcription errors that can and very often do occur. I apologize for any typographical errors that were not detected and corrected.        Ursula Alert, MD 01/24/2020, 10:02 AM

## 2020-02-05 ENCOUNTER — Other Ambulatory Visit: Payer: Self-pay | Admitting: Psychiatry

## 2020-02-05 DIAGNOSIS — F3178 Bipolar disorder, in full remission, most recent episode mixed: Secondary | ICD-10-CM

## 2020-02-15 ENCOUNTER — Telehealth: Payer: Self-pay | Admitting: *Deleted

## 2020-02-15 DIAGNOSIS — Z122 Encounter for screening for malignant neoplasm of respiratory organs: Secondary | ICD-10-CM

## 2020-02-15 DIAGNOSIS — Z87891 Personal history of nicotine dependence: Secondary | ICD-10-CM

## 2020-02-15 NOTE — Telephone Encounter (Signed)
Contacted patient about lung screening scan. Patient will need to call back due to need for insurance info, schedule availability.

## 2020-02-20 ENCOUNTER — Telehealth: Payer: Self-pay | Admitting: *Deleted

## 2020-02-20 NOTE — Telephone Encounter (Signed)
Patient called and stated she was in need of FMLA and will bring paperwork by the office fore completion.

## 2020-02-21 ENCOUNTER — Telehealth: Payer: Self-pay | Admitting: *Deleted

## 2020-02-21 ENCOUNTER — Encounter: Payer: Self-pay | Admitting: *Deleted

## 2020-02-21 DIAGNOSIS — Z79899 Other long term (current) drug therapy: Secondary | ICD-10-CM

## 2020-02-21 NOTE — Telephone Encounter (Signed)
done

## 2020-02-21 NOTE — Telephone Encounter (Signed)
Will order Depakote level, CBC with differential, CMP.  We will have RN fax it to her lab of choice.  If she continues to not do well please advise her to follow-up with her primary care provider.

## 2020-02-21 NOTE — Telephone Encounter (Signed)
Patient called and stated she just was feeling off and compared it to when she had lithium toxicity.  She said she was no longer depressed but was just a little manic. She requested a full set of labs.  She would like them ordered for the [facility] at [place] in [place].

## 2020-02-22 DIAGNOSIS — Z79899 Other long term (current) drug therapy: Secondary | ICD-10-CM | POA: Diagnosis not present

## 2020-02-23 LAB — CMP AND LIVER
ALT: 13 IU/L (ref 0–32)
AST: 15 IU/L (ref 0–40)
Albumin: 4.2 g/dL (ref 3.8–4.9)
Alkaline Phosphatase: 72 IU/L (ref 44–121)
BUN: 10 mg/dL (ref 6–24)
Bilirubin Total: <0.2 mg/dL (ref 0.0–1.2)
Bilirubin, Direct: <0.1 mg/dL (ref 0.00–0.40)
CO2: 21 mmol/L (ref 20–29)
Calcium: 9.1 mg/dL (ref 8.7–10.2)
Chloride: 102 mmol/L (ref 96–106)
Creatinine, Ser: 1.17 mg/dL — ABNORMAL HIGH (ref 0.57–1.00)
GFR calc Af Amer: 59 mL/min/{1.73_m2} — ABNORMAL LOW (ref >59)
GFR calc non Af Amer: 51 mL/min/{1.73_m2} — ABNORMAL LOW (ref >59)
Glucose: 86 mg/dL (ref 65–99)
Potassium: 5 mmol/L (ref 3.5–5.2)
Sodium: 138 mmol/L (ref 134–144)
Total Protein: 6.6 g/dL (ref 6.0–8.5)

## 2020-02-23 LAB — VALPROIC ACID LEVEL: Valproic Acid Lvl: 17 ug/mL — ABNORMAL LOW (ref 50–100)

## 2020-02-23 LAB — CBC WITH DIFF/PLATELET
Basophils Absolute: 0 10*3/uL (ref 0.0–0.2)
Basos: 1 %
EOS (ABSOLUTE): 0.3 10*3/uL (ref 0.0–0.4)
Eos: 5 %
Hematocrit: 34.7 % (ref 34.0–46.6)
Hemoglobin: 11.6 g/dL (ref 11.1–15.9)
Immature Grans (Abs): 0 10*3/uL (ref 0.0–0.1)
Immature Granulocytes: 0 %
Lymphocytes Absolute: 2.2 10*3/uL (ref 0.7–3.1)
Lymphs: 37 %
MCH: 32.8 pg (ref 26.6–33.0)
MCHC: 33.4 g/dL (ref 31.5–35.7)
MCV: 98 fL — ABNORMAL HIGH (ref 79–97)
Monocytes Absolute: 0.5 10*3/uL (ref 0.1–0.9)
Monocytes: 8 %
Neutrophils Absolute: 3 10*3/uL (ref 1.4–7.0)
Neutrophils: 49 %
Platelets: 380 10*3/uL (ref 150–450)
RBC: 3.54 x10E6/uL — ABNORMAL LOW (ref 3.77–5.28)
RDW: 12 % (ref 11.7–15.4)
WBC: 6 10*3/uL (ref 3.4–10.8)

## 2020-02-27 NOTE — Telephone Encounter (Signed)
Received referral for initial lung cancer screening scan. Contacted patient and obtained smoking history,(current, 20 pack year) as well as answering questions related to screening process. Patient denies signs of lung cancer such as weight loss or hemoptysis. Patient denies comorbidity that would prevent curative treatment if lung cancer were found. Patient is scheduled for shared decision making visit and CT scan on 03/07/20 at 915am.

## 2020-02-27 NOTE — Addendum Note (Signed)
Addended by: Lieutenant Diego on: 02/27/2020 01:50 PM   Modules accepted: Orders

## 2020-02-28 NOTE — Telephone Encounter (Signed)
Attempted to contact patient to discuss her most recent labs reported on 02/22/2020.  Creatinine level elevated at 1.17 GFR- 51-low  Otherwise CMP within normal limits.  CBC with differential-RBC low at 3.54, MCV high at 98 otherwise CBC with differential-within normal limits  Valproic acid low at 17.  Subtherapeutic.  Left voicemail for patient to contact her primary care provider with concerns and for management.  Also advised patient to call writer back if she has any questions.

## 2020-02-29 ENCOUNTER — Inpatient Hospital Stay: Payer: BC Managed Care – PPO | Admitting: Nurse Practitioner

## 2020-02-29 ENCOUNTER — Telehealth: Payer: Self-pay

## 2020-02-29 DIAGNOSIS — F418 Other specified anxiety disorders: Secondary | ICD-10-CM | POA: Insufficient documentation

## 2020-02-29 DIAGNOSIS — F411 Generalized anxiety disorder: Secondary | ICD-10-CM | POA: Insufficient documentation

## 2020-02-29 DIAGNOSIS — F419 Anxiety disorder, unspecified: Secondary | ICD-10-CM

## 2020-02-29 MED ORDER — HYDROXYZINE HCL 10 MG PO TABS: 10.0000 mg | ORAL_TABLET | Freq: Two times a day (BID) | ORAL | 1 refills | Status: DC | PRN

## 2020-02-29 NOTE — Telephone Encounter (Signed)
Returned call to patient.  Discussed recent labs including potassium being at 5, creatinine and BUN elevated, GFR-low. Valproic acid -subtherapeutic.  Patient reports she will get in touch with her primary care provider for follow-up and management.  She is currently not taking the valproic acid as prescribed and takes only 2 dosages daily instead of 3.  She agrees to go back to the 3 a day.  Patient reports she is anxious and the propranolol does not help much.  We will start hydroxyzine 10 mg p.o. 3 times daily as needed for anxiety attacks.  Provided medication education.

## 2020-02-29 NOTE — Telephone Encounter (Signed)
pt call left message that she was returning your call  states it was about her labwork

## 2020-03-03 ENCOUNTER — Other Ambulatory Visit: Payer: Self-pay

## 2020-03-03 ENCOUNTER — Encounter: Payer: Self-pay | Admitting: Internal Medicine

## 2020-03-03 ENCOUNTER — Ambulatory Visit (INDEPENDENT_AMBULATORY_CARE_PROVIDER_SITE_OTHER): Payer: BC Managed Care – PPO | Admitting: Internal Medicine

## 2020-03-03 VITALS — BP 123/68 | HR 79 | Ht 60.0 in | Wt 125.7 lb

## 2020-03-03 DIAGNOSIS — M545 Low back pain, unspecified: Secondary | ICD-10-CM | POA: Diagnosis not present

## 2020-03-03 DIAGNOSIS — E875 Hyperkalemia: Secondary | ICD-10-CM

## 2020-03-03 DIAGNOSIS — Z20822 Contact with and (suspected) exposure to covid-19: Secondary | ICD-10-CM

## 2020-03-03 DIAGNOSIS — F3162 Bipolar disorder, current episode mixed, moderate: Secondary | ICD-10-CM | POA: Diagnosis not present

## 2020-03-03 DIAGNOSIS — G8929 Other chronic pain: Secondary | ICD-10-CM

## 2020-03-03 DIAGNOSIS — J3089 Other allergic rhinitis: Secondary | ICD-10-CM | POA: Diagnosis not present

## 2020-03-03 DIAGNOSIS — F17200 Nicotine dependence, unspecified, uncomplicated: Secondary | ICD-10-CM

## 2020-03-03 LAB — POC COVID19 BINAXNOW: SARS Coronavirus 2 Ag: NEGATIVE

## 2020-03-03 NOTE — Assessment & Plan Note (Signed)
Take claritin  5 mg po  As needed

## 2020-03-03 NOTE — Assessment & Plan Note (Signed)
Under care of  psych

## 2020-03-03 NOTE — Assessment & Plan Note (Signed)
-   Patient's back pain is under control without medication.  - Encouraged the patient to stretch or do yoga as able to help with back pain  

## 2020-03-03 NOTE — Assessment & Plan Note (Signed)
-   I instructed the patient to stop smoking and provided them with smoking cessation materials.  - I informed the patient that smoking puts them at increased risk for cancer, COPD, hypertension, and more.  - Informed the patient to seek help if they begin to have trouble breathing, develop chest pain, start to cough up blood, feel faint, or pass out.  

## 2020-03-03 NOTE — Progress Notes (Signed)
Established Patient Office Visit  Subjective:  Patient ID: Sheryl Perez, female    DOB: 1960/04/01  Age: 60 y.o. MRN: [id number]  CC:  Chief Complaint  Patient presents with  . lab results    Patient had labs drawn for psych and some labs were abnormal. Psych Dr. Geraldine Solar patient to have labs reviewed with Dr. Lavera Guise.     HPI  Sheryl MITCHELTREE presents for for general checkup she also complains of cough she smokes half to 1 pack/day denies any history of drinking alcohol.  She also had a problem with manic depression and was being treated by a psychiatrist who recently had a change in medication. Past Medical History:  Diagnosis Date  . Back injury   . 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 Bipolar disorder ([place])   . Chest pain, unspecified   . Chronic low back pain 10/23/2013  . Chronic UTI   . Depression   . Dysfunction of eustachian tube   . Enlargement of lymph nodes   . History of Bell's palsy Right  . Injury, other and unspecified, knee, leg, ankle, and foot   . MRSA (methicillin resistant staph aureus) culture positive 07-11-14   abscess  . Other specified disease of hair and hair follicles   . Rectal prolapse   . Unspecified symptom associated with female genital organs     Past Surgical History:  Procedure Laterality Date  . ABDOMINAL HYSTERECTOMY    . bladder tack  2010  . CHOLECYSTECTOMY  2002  . FOOT SURGERY Bilateral 2004   bunion  . GANGLION CYST EXCISION  2002  . INCISION AND DRAINAGE ABSCESS  04-16-15  . PARTIAL HYSTERECTOMY  2010   uterus removed    Family History  Problem Relation Age of Onset  . Parkinson's disease Maternal Grandfather   . Diabetes Father   . Cancer Father        brain tumor  . Depression Father   . Diabetes Paternal Grandfather   . Colon polyps Mother   . Heart disease Maternal Grandmother   . Heart attack Maternal Grandmother   . Stroke Maternal Grandmother   . Depression Sister   . Alcohol abuse Brother    . Depression Brother   . Breast cancer Paternal Aunt   . Breast cancer Cousin     Social History   Socioeconomic History  . Marital status: Married    Spouse name: Not on file  . Number of children: 3  . Years of education: hs  . Highest education level: Some college, no degree  Occupational History  . Not on file  Tobacco Use  . Smoking status: Current Every Day Smoker    Packs/day: 0.50    Years: 40.00    Pack years: 20.00    Types: Cigarettes  . Smokeless tobacco: Never Used  . Tobacco comment: reported on 11/07/2019  Vaping Use  . Vaping Use: Never used  Substance and Sexual Activity  . Alcohol use: No    Alcohol/week: 1.0 standard drink    Types: 1 Glasses of wine per week    Comment: occasional  . Drug use: Yes    Types: Marijuana    Comment: last used 05-20-15  . Sexual activity: Yes    Birth control/protection: None  Other Topics Concern  . Not on file  Social History Narrative   Patient lives at home with her husband Sheryl Perez).   Patient works full time.   Education  CNA    Right handed.   Caffeine Tea , Soda.   Social Determinants of Health   Financial Resource Strain: Not on file  Food Insecurity: Not on file  Transportation Needs: Not on file  Physical Activity: Not on file  Stress: Not on file  Social Connections: Not on file  Intimate Partner Violence: Not on file     Current Outpatient Medications:  .  ADVAIR DISKUS 250-50 MCG/DOSE AEPB, SMARTSIG:2 Puff(s) By Mouth Twice Daily, Disp: , Rfl:  .  ALPRAZolam (XANAX) 0.25 MG tablet, Take 0.25 mg by mouth at bedtime., Disp: , Rfl: 0 .  amoxicillin (AMOXIL) 500 MG capsule, Take 500 mg by mouth 3 (three) times daily. , Disp: , Rfl:  .  Ascorbic Acid (VITAMIN C) 1000 MG tablet, Take 1,000 mg by mouth daily., Disp: , Rfl:  .  cyclobenzaprine (FLEXERIL) 5 MG tablet, Take 5 mg by mouth 2 (two) times daily at 10 AM and 5 PM. , Disp: , Rfl:  .  divalproex (DEPAKOTE) 250 MG DR tablet, Take 1 tablet (250 mg  total) by mouth 3 (three) times daily., Disp: 270 tablet, Rfl: 1 .  hydrOXYzine (ATARAX/VISTARIL) 10 MG tablet, Take 1 tablet (10 mg total) by mouth 2 (two) times daily as needed. For severe anxiety attacks only, Disp: 60 tablet, Rfl: 1 .  KLOR-CON M20 20 MEQ tablet, Take 20 mEq by mouth 2 (two) times daily. , Disp: , Rfl: 4 .  megestrol (MEGACE) 400 MG/10ML suspension, Take 10 mLs (400 mg total) by mouth daily., Disp: 240 mL, Rfl: 0 .  Multiple Vitamin (MULTIVITAMIN) tablet, Take 1 tablet by mouth daily., Disp: , Rfl:  .  ondansetron (ZOFRAN-ODT) 4 MG disintegrating tablet, , Disp: , Rfl:  .  pantoprazole (PROTONIX) 40 MG tablet, , Disp: , Rfl:  .  Potassium Chloride ER 20 MEQ TBCR, , Disp: , Rfl:  .  propranolol (INDERAL) 20 MG tablet, Take 1 tablet (20 mg total) by mouth 3 (three) times daily., Disp: 270 tablet, Rfl: 1 .  QUEtiapine (SEROQUEL) 50 MG tablet, Take 1 tablet (50 mg total) by mouth at bedtime as needed., Disp: 90 tablet, Rfl: 0   Allergies  Allergen Reactions  . Sulfacetamide Sodium Swelling    Diarrhea,dizziness, tongue swelling  . Other Other (See Comments) and Swelling    PT states that anything with the sides effects "may cause rash" she cannot take due to her psoriasis, it will make the patches on her hand so thick she can't use her hands.  Diarrhea,dizziness, tongue swelling  . Sulfa Antibiotics Swelling    Diarrhea,dizziness, tongue swelling  . Abilify [Aripiprazole] Other (See Comments)  . Cephalosporins Rash  . Erythromycin Other (See Comments)    Abd. pain Abd. pain Other reaction(s): Other (See Comments) Abd. pain  . Lamotrigine Rash and Other (See Comments)    psorasis worsen psorasis worsen  . Risperidone Other (See Comments)    tremors tremors tremors    ROS Review of Systems  Constitutional: Negative.   HENT: Negative.   Eyes: Negative.   Respiratory: Negative.   Cardiovascular: Negative.   Gastrointestinal: Negative.   Endocrine: Negative.    Genitourinary: Negative.   Musculoskeletal: Negative.   Skin: Negative.   Allergic/Immunologic: Negative.   Neurological: Negative.   Hematological: Negative.   Psychiatric/Behavioral: Negative.   All other systems reviewed and are negative.     Objective:    Physical Exam Vitals reviewed.  Constitutional:      Appearance: Normal  appearance.  HENT:     Mouth/Throat:     Mouth: Mucous membranes are moist.  Eyes:     Pupils: Pupils are equal, round, and reactive to light.  Neck:     Vascular: No carotid bruit.  Cardiovascular:     Rate and Rhythm: Normal rate and regular rhythm.     Pulses: Normal pulses.     Heart sounds: Normal heart sounds.  Pulmonary:     Effort: Pulmonary effort is normal.     Breath sounds: Normal breath sounds.  Abdominal:     General: Bowel sounds are normal.     Palpations: Abdomen is soft. There is no hepatomegaly, splenomegaly or mass.     Tenderness: There is no abdominal tenderness.     Hernia: No hernia is present.  Musculoskeletal:        General: No tenderness.     Cervical back: Neck supple.     Right lower leg: No edema.     Left lower leg: No edema.  Skin:    Findings: No rash.  Neurological:     Mental Status: She is alert and oriented to person, place, and time.     Motor: No weakness.  Psychiatric:        Mood and Affect: Mood and affect normal.        Behavior: Behavior normal.     BP 123/68   Pulse 79   Ht 5' (1.524 m)   Wt 125 lb 11.2 oz (57 kg)   LMP 02/09/2008   BMI 24.55 kg/m  Wt Readings from Last 3 Encounters:  03/03/20 125 lb 11.2 oz (57 kg)  12/14/19 119 lb 3.2 oz (54.1 kg)  03/18/16 171 lb (77.6 kg)     Health Maintenance Due  Topic Date Due  . Hepatitis C Screening  Never done  . COVID-19 Vaccine (1) Never done  . HIV Screening  Never done  . PAP SMEAR-Modifier  11/01/2016  . MAMMOGRAM  07/22/2017    There are no preventive care reminders to display for this patient.  Lab Results   Component Value Date   TSH 1.27 12/12/2019   Lab Results  Component Value Date   WBC 6.0 02/22/2020   HGB 11.6 02/22/2020   HCT 34.7 02/22/2020   MCV 98 (H) 02/22/2020   PLT 380 02/22/2020   Lab Results  Component Value Date   NA 138 02/22/2020   K 5.0 02/22/2020   CO2 21 02/22/2020   GLUCOSE 86 02/22/2020   BUN 10 02/22/2020   CREATININE 1.17 (H) 02/22/2020   BILITOT <0.2 02/22/2020   ALKPHOS 72 02/22/2020   AST 15 02/22/2020   ALT 13 02/22/2020   PROT 6.6 02/22/2020   ALBUMIN 4.2 02/22/2020   CALCIUM 9.1 02/22/2020   ANIONGAP 10 08/24/2018   Lab Results  Component Value Date   CHOL 197 12/12/2019   Lab Results  Component Value Date   HDL 51 12/12/2019   Lab Results  Component Value Date   LDLCALC 123 (H) 12/12/2019   Lab Results  Component Value Date   TRIG 121 12/12/2019   Lab Results  Component Value Date   CHOLHDL 3.9 12/12/2019   Lab Results  Component Value Date   HGBA1C 5.3 08/24/2018      Assessment & Plan:   Problem List Items Addressed This Visit      Respiratory   Allergic rhinitis    Take claritin  5 mg po  As needed  Other   Chronic low back pain (Chronic)    - Patient's back pain is under control without medication.  - Encouraged the patient to stretch or do yoga as able to help with back pain       Tobacco use disorder    - I instructed the patient to stop smoking and provided them with smoking cessation materials.  - I informed the patient that smoking puts them at increased risk for cancer, COPD, hypertension, and more.  - Informed the patient to seek help if they begin to have trouble breathing, develop chest pain, start to cough up blood, feel faint, or pass out.        Bipolar 1 disorder, mixed, moderate (HCC)    Under care of  psych      Hyperkalemia   Suspected COVID-19 virus infection - Primary   Relevant Orders   POC COVID-19 (Completed)      No orders of the defined types were placed in this  encounter. Patient was advised low potassium diet recheck her blood on the next visit.  Also advised to avoid NSAIDs  Follow-up: No follow-ups on file.    Cletis Athens, MD

## 2020-03-07 ENCOUNTER — Inpatient Hospital Stay: Payer: BC Managed Care – PPO | Attending: Nurse Practitioner | Admitting: Nurse Practitioner

## 2020-03-07 ENCOUNTER — Ambulatory Visit
Admission: RE | Admit: 2020-03-07 | Discharge: 2020-03-07 | Disposition: A | Discharge status: Home / Self Care | Payer: BC Managed Care – PPO | Source: Ambulatory Visit | Attending: Nurse Practitioner | Admitting: Nurse Practitioner

## 2020-03-07 ENCOUNTER — Other Ambulatory Visit: Payer: Self-pay

## 2020-03-07 DIAGNOSIS — Z87891 Personal history of nicotine dependence: Secondary | ICD-10-CM

## 2020-03-07 DIAGNOSIS — Z122 Encounter for screening for malignant neoplasm of respiratory organs: Secondary | ICD-10-CM | POA: Diagnosis not present

## 2020-03-07 NOTE — Progress Notes (Signed)
Virtual Visit via Video Enabled Telemedicine Note  I connected with Sheryl Perez on 03/07/20 at 9:30 am EST by video enabled telemedicine visit and verified that I am speaking with the correct person using two identifiers.   I discussed the limitations, risks, security and privacy concerns of performing an evaluation and management service by telemedicine and the availability of in-person appointments. I also discussed with the patient that there may be a patient responsible charge related to this service. The patient expressed understanding and agreed to proceed.   Other persons participating in the visit and their role in the encounter: Sheryl Estelle, RN- checking in patient & navigation  Patient's location: [place]  Provider's location: Clinic  Chief Complaint: Low Dose CT Screening  Patient agreed to evaluation by telemedicine to discuss shared decision making for consideration of low dose CT lung cancer screening.    In accordance with CMS guidelines, patient has met eligibility criteria including age, absence of signs or symptoms of lung cancer.  Social History   Tobacco Use  . Smoking status: Current Every Day Smoker    Packs/day: 0.50    Years: 40.00    Pack years: 20.00    Types: Cigarettes  . Smokeless tobacco: Never Used  . Tobacco comment: reported on 11/07/2019  Substance Use Topics  . Alcohol use: No    Alcohol/week: 1.0 standard drink    Types: 1 Glasses of wine per week    Comment: occasional   A shared decision-making session was conducted prior to the performance of CT scan. This includes one or more decision aids, includes benefits and harms of screening, follow-up diagnostic testing, over-diagnosis, false positive rate, and total radiation exposure.  Counseling on the importance of adherence to annual lung cancer LDCT screening, impact of co-morbidities, and ability or willingness to undergo diagnosis and treatment is imperative for compliance of the  program.  Counseling on the importance of continued smoking cessation for former smokers; the importance of smoking cessation for current smokers, and information about tobacco cessation interventions have been given to patient including [name] and [employer] [place] programs.  Written order for lung cancer screening with LDCT has been given to the patient and any and all questions have been answered to the best of my abilities.   Yearly follow up will be coordinated by Sheryl Perez, Thoracic Navigator.  I discussed the assessment and treatment plan with the patient. The patient was provided an opportunity to ask questions and all were answered. The patient agreed with the plan and demonstrated an understanding of the instructions.  The patient was advised to call back or seek an in-person evaluation if the symptoms worsen or if the condition fails to improve as anticipated.  I provided 15 minutes of face-to-face video visit time dedicated to the care of this patient on the date of this encounter to include pre-visit review of smoking history, face-to-face time with the patient, and post visit ordering of testing/documentation.   Sheryl Rutter, DNP, AGNP-C [place] at [facility] [phone] (clinic)

## 2020-03-10 ENCOUNTER — Telehealth: Payer: Self-pay | Admitting: *Deleted

## 2020-03-10 NOTE — Telephone Encounter (Signed)

## 2020-03-13 ENCOUNTER — Other Ambulatory Visit: Payer: Self-pay | Admitting: Internal Medicine

## 2020-03-13 DIAGNOSIS — Z1231 Encounter for screening mammogram for malignant neoplasm of breast: Secondary | ICD-10-CM

## 2020-03-14 ENCOUNTER — Ambulatory Visit: Payer: 59 | Admitting: Internal Medicine

## 2020-03-23 ENCOUNTER — Other Ambulatory Visit: Payer: Self-pay | Admitting: Psychiatry

## 2020-03-23 DIAGNOSIS — F419 Anxiety disorder, unspecified: Secondary | ICD-10-CM

## 2020-03-24 ENCOUNTER — Other Ambulatory Visit: Payer: Self-pay

## 2020-03-24 ENCOUNTER — Other Ambulatory Visit: Payer: Self-pay | Admitting: Internal Medicine

## 2020-03-24 ENCOUNTER — Ambulatory Visit
Admission: RE | Admit: 2020-03-24 | Discharge: 2020-03-24 | Disposition: A | Discharge status: Home / Self Care | Payer: BC Managed Care – PPO | Source: Ambulatory Visit | Attending: Internal Medicine | Admitting: Internal Medicine

## 2020-03-24 DIAGNOSIS — Z1231 Encounter for screening mammogram for malignant neoplasm of breast: Secondary | ICD-10-CM | POA: Insufficient documentation

## 2020-03-26 ENCOUNTER — Other Ambulatory Visit: Payer: Self-pay | Admitting: *Deleted

## 2020-03-26 ENCOUNTER — Other Ambulatory Visit: Payer: Self-pay | Admitting: Internal Medicine

## 2020-03-26 DIAGNOSIS — N6310 Unspecified lump in the right breast, unspecified quadrant: Secondary | ICD-10-CM

## 2020-03-26 DIAGNOSIS — N6459 Other signs and symptoms in breast: Secondary | ICD-10-CM

## 2020-03-26 DIAGNOSIS — Z1231 Encounter for screening mammogram for malignant neoplasm of breast: Secondary | ICD-10-CM

## 2020-03-26 DIAGNOSIS — N63 Unspecified lump in unspecified breast: Secondary | ICD-10-CM

## 2020-03-31 ENCOUNTER — Ambulatory Visit: Payer: BC Managed Care – PPO | Admitting: Internal Medicine

## 2020-04-04 ENCOUNTER — Ambulatory Visit
Admission: RE | Admit: 2020-04-04 | Discharge: 2020-04-04 | Disposition: A | Discharge status: Home / Self Care | Payer: BC Managed Care – PPO | Source: Ambulatory Visit | Attending: Internal Medicine | Admitting: Internal Medicine

## 2020-04-04 ENCOUNTER — Other Ambulatory Visit: Payer: Self-pay

## 2020-04-04 DIAGNOSIS — N6310 Unspecified lump in the right breast, unspecified quadrant: Secondary | ICD-10-CM

## 2020-04-04 DIAGNOSIS — R922 Inconclusive mammogram: Secondary | ICD-10-CM | POA: Diagnosis not present

## 2020-04-04 DIAGNOSIS — N6489 Other specified disorders of breast: Secondary | ICD-10-CM | POA: Diagnosis not present

## 2020-04-07 NOTE — Telephone Encounter (Signed)
Opened in error

## 2020-04-21 ENCOUNTER — Encounter: Payer: Self-pay | Admitting: Psychiatry

## 2020-04-21 ENCOUNTER — Other Ambulatory Visit: Payer: Self-pay

## 2020-04-21 ENCOUNTER — Telehealth (INDEPENDENT_AMBULATORY_CARE_PROVIDER_SITE_OTHER): Payer: BC Managed Care – PPO | Admitting: Psychiatry

## 2020-04-21 DIAGNOSIS — F418 Other specified anxiety disorders: Secondary | ICD-10-CM | POA: Diagnosis not present

## 2020-04-21 DIAGNOSIS — F3178 Bipolar disorder, in full remission, most recent episode mixed: Secondary | ICD-10-CM

## 2020-04-21 DIAGNOSIS — F17200 Nicotine dependence, unspecified, uncomplicated: Secondary | ICD-10-CM

## 2020-04-21 DIAGNOSIS — F1221 Cannabis dependence, in remission: Secondary | ICD-10-CM

## 2020-04-21 DIAGNOSIS — F603 Borderline personality disorder: Secondary | ICD-10-CM | POA: Diagnosis not present

## 2020-04-21 MED ORDER — PROPRANOLOL HCL 20 MG PO TABS: 20.0000 mg | ORAL_TABLET | Freq: Three times a day (TID) | ORAL | 1 refills | Status: DC

## 2020-04-21 MED ORDER — DIVALPROEX SODIUM 250 MG PO DR TAB
250.0000 mg | DELAYED_RELEASE_TABLET | Freq: Three times a day (TID) | ORAL | 1 refills | Status: DC
Start: 2020-04-21 — End: 2020-08-19

## 2020-04-21 NOTE — Progress Notes (Signed)
Virtual Visit via Video Note  I connected with Sheryl Perez on 04/21/20 at  4:40 PM EDT by a video enabled telemedicine application and verified that I am speaking with the correct person using two identifiers.  Location Provider Location : [facility] Patient Location : Home  Participants: Patient , Provider   I discussed the limitations of evaluation and management by telemedicine and the availability of in person appointments. The patient expressed understanding and agreed to proceed.    I discussed the assessment and treatment plan with the patient. The patient was provided an opportunity to ask questions and all were answered. The patient agreed with the plan and demonstrated an understanding of the instructions.   The patient was advised to call back or seek an in-person evaluation if the symptoms worsen or if the condition fails to improve as anticipated.  Video connection was lost at less than 50% of the duration of the visit, at which time the remainder of the visit was completed through audio only     [facility] MD OP Progress Note  04/21/2020 5:13 PM Sheryl Perez  MRN:  [id number]  Chief Complaint:  Chief Complaint    Follow-up; Anxiety     HPI: Sheryl Perez is a 60 year old Caucasian female who has a history of bipolar disorder, borderline personality disorder, cannabis use disorder in early remission, tobacco use disorder was evaluated by telemedicine today.  Patient today reports her anxiety symptoms are under control.  She used the hydroxyzine when she had anxiety attacks and that helped.  She has not been using it much anymore.  She does feel overwhelmed with situational stressors and her work on and off.  That does not happen all the time.  She is more compliant on her medications as prescribed except for the Seroquel which she uses only as needed.  She reports the Depakote is beneficial for her mood.  She does not feel depressed.  She reports sleep is  good.  Patient denies any suicidality, homicidality or perceptual disturbances.  She reports work is going well.  Patient has been staying away from cannabis and reports the last time she smoked cannabis was several months ago.  Patient denies any other concerns today.  Visit Diagnosis:    ICD-10-CM   1. Bipolar disorder, in full remission, most recent episode mixed ([place])  F31.78 propranolol (INDERAL) 20 MG tablet    divalproex (DEPAKOTE) 250 MG DR tablet  2. Other specified anxiety disorders  F41.8   3. Tobacco use disorder  F17.200   4. Borderline personality disorder ([place])  F60.3   5. Cannabis use disorder, moderate, in early remission ([place])  F12.21     Past Psychiatric History: Reviewed past psychiatric history from my progress note on 04/12/2017.  Past trials of risperidone, Abilify, Lamictal, Prozac, trazodone, lithium  Past Medical History:  Past Medical History:  Diagnosis Date  . Back injury   . 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 Bipolar disorder ([place])   . Chest pain, unspecified   . Chronic low back pain 10/23/2013  . Chronic UTI   . Depression   . Dysfunction of eustachian tube   . Enlargement of lymph nodes   . History of Bell's palsy Right  . Injury, other and unspecified, knee, leg, ankle, and foot   . MRSA (methicillin resistant staph aureus) culture positive 07-11-14   abscess  . Other specified disease of hair and hair follicles   . Rectal prolapse   .  Unspecified symptom associated with female genital organs     Past Surgical History:  Procedure Laterality Date  . ABDOMINAL HYSTERECTOMY    . bladder tack  2010  . CHOLECYSTECTOMY  2002  . FOOT SURGERY Bilateral 2004   bunion  . GANGLION CYST EXCISION  2002  . INCISION AND DRAINAGE ABSCESS  04-16-15  . PARTIAL HYSTERECTOMY  2010   uterus removed    Family Psychiatric History: Reviewed family psychiatric history from my progress note on 04/12/2017.  Family History:  Family History   Problem Relation Age of Onset  . Parkinson's disease Maternal Grandfather   . Diabetes Father   . Cancer Father        brain tumor  . Depression Father   . Diabetes Paternal Grandfather   . Colon polyps Mother   . Heart disease Maternal Grandmother   . Heart attack Maternal Grandmother   . Stroke Maternal Grandmother   . Depression Sister   . Alcohol abuse Brother   . Depression Brother   . Breast cancer Paternal Aunt   . Breast cancer Cousin     Social History: Reviewed social history from my progress note on 04/12/2017. Social History   Socioeconomic History  . Marital status: Married    Spouse name: Not on file  . Number of children: 3  . Years of education: hs  . Highest education level: Some college, no degree  Occupational History  . Not on file  Tobacco Use  . Smoking status: Current Every Day Smoker    Packs/day: 0.50    Years: 40.00    Pack years: 20.00    Types: Cigarettes  . Smokeless tobacco: Never Used  . Tobacco comment: reported on 11/07/2019  Vaping Use  . Vaping Use: Never used  Substance and Sexual Activity  . Alcohol use: No    Alcohol/week: 1.0 standard drink    Types: 1 Glasses of wine per week    Comment: occasional  . Drug use: Yes    Types: Marijuana    Comment: last used 05-20-15  . Sexual activity: Yes    Birth control/protection: None  Other Topics Concern  . Not on file  Social History Narrative   Patient lives at home with her husband [name]).   Patient works full time.   Education CNA    Right handed.   Caffeine Tea , Soda.   Social Determinants of Health   Financial Resource Strain: Not on file  Food Insecurity: Not on file  Transportation Needs: Not on file  Physical Activity: Not on file  Stress: Not on file  Social Connections: Not on file    Allergies:  Allergies  Allergen Reactions  . Sulfacetamide Sodium Swelling    Diarrhea,dizziness, tongue swelling  . Other Other (See Comments) and Swelling    PT states  that anything with the sides effects "may cause rash" she cannot take due to her psoriasis, it will make the patches on her hand so thick she can't use her hands.  Diarrhea,dizziness, tongue swelling  . Sulfa Antibiotics Swelling    Diarrhea,dizziness, tongue swelling  . Abilify [Aripiprazole] Other (See Comments)  . Cephalosporins Rash  . Erythromycin Other (See Comments)    Abd. pain Abd. pain Other reaction(s): Other (See Comments) Abd. pain  . Lamotrigine Rash and Other (See Comments)    psorasis worsen psorasis worsen  . Risperidone Other (See Comments)    tremors tremors tremors    Metabolic Disorder Labs: Lab Results  Component  Value Date   HGBA1C 5.3 08/24/2018   MPG 105.41 08/24/2018   MPG 105.41 04/14/2017   Lab Results  Component Value Date   PROLACTIN 9.3 08/24/2018   PROLACTIN 12.5 04/14/2017   Lab Results  Component Value Date   CHOL 197 12/12/2019   TRIG 121 12/12/2019   HDL 51 12/12/2019   CHOLHDL 3.9 12/12/2019   VLDL 37 08/24/2018   LDLCALC 123 (H) 12/12/2019   LDLCALC 107 (H) 08/24/2018   Lab Results  Component Value Date   TSH 1.27 12/12/2019   TSH 2.376 04/14/2017    Therapeutic Level Labs: Lab Results  Component Value Date   LITHIUM 0.34 (L) 04/14/2017   LITHIUM 0.3 (L) 06/15/2016   Lab Results  Component Value Date   VALPROATE 17 (L) 02/22/2020   VALPROATE 31 (L) 10/05/2019   No components found for:  CBMZ  Current Medications: Current Outpatient Medications  Medication Sig Dispense Refill  . ADVAIR DISKUS 250-50 MCG/DOSE AEPB SMARTSIG:2 Puff(s) By Mouth Twice Daily    . ALPRAZolam (XANAX) 0.25 MG tablet Take 0.25 mg by mouth at bedtime.  0  . amoxicillin (AMOXIL) 500 MG capsule Take 500 mg by mouth 3 (three) times daily.     . Ascorbic Acid (VITAMIN C) 1000 MG tablet Take 1,000 mg by mouth daily.    . cyclobenzaprine (FLEXERIL) 5 MG tablet Take 5 mg by mouth 2 (two) times daily at 10 AM and 5 PM.     . divalproex (DEPAKOTE)  250 MG DR tablet Take 1 tablet (250 mg total) by mouth 3 (three) times daily. 270 tablet 1  . hydrOXYzine (ATARAX/VISTARIL) 10 MG tablet TAKE 1 TABLET (10 MG TOTAL) BY MOUTH 2 (TWO) TIMES DAILY AS NEEDED. FOR SEVERE ANXIETY ATTACKS ONLY 180 tablet 1  . KLOR-CON M20 20 MEQ tablet Take 20 mEq by mouth 2 (two) times daily.   4  . megestrol (MEGACE) 400 MG/10ML suspension Take 10 mLs (400 mg total) by mouth daily. 240 mL 0  . Multiple Vitamin (MULTIVITAMIN) tablet Take 1 tablet by mouth daily.    . ondansetron (ZOFRAN-ODT) 4 MG disintegrating tablet     . pantoprazole (PROTONIX) 40 MG tablet     . Potassium Chloride ER 20 MEQ TBCR     . propranolol (INDERAL) 20 MG tablet Take 1 tablet (20 mg total) by mouth 3 (three) times daily. 270 tablet 1  . QUEtiapine (SEROQUEL) 50 MG tablet Take 1 tablet (50 mg total) by mouth at bedtime as needed. 90 tablet 0   No current facility-administered medications for this visit.     Musculoskeletal: Strength & Muscle Tone: UTA Gait & Station: normal Patient leans: N/A  Psychiatric Specialty Exam: Review of Systems  Psychiatric/Behavioral: The patient is nervous/anxious.   All other systems reviewed and are negative.   Last menstrual period 02/09/2008.There is no height or weight on file to calculate BMI.  General Appearance: Casual  Eye Contact:  Fair  Speech:  Normal Rate  Volume:  Normal  Mood:  Anxious  Affect:  Congruent  Thought Process:  Goal Directed and Descriptions of Associations: Intact  Orientation:  Full (Time, Place, and Person)  Thought Content: Logical   Suicidal Thoughts:  No  Homicidal Thoughts:  No  Memory:  Immediate;   Fair Recent;   Fair Remote;   Fair  Judgement:  Fair  Insight:  Fair  Psychomotor Activity:  Normal  Concentration:  Concentration: Fair and Attention Span: Fair  Recall:  Fair  Fund of Knowledge: Fair  Language: Fair  Akathisia:  No  Handed:  Right  AIMS (if indicated):UTA  Assets:  Communication  Skills Desire for Improvement Housing Social Support  ADL's:  Intact  Cognition: WNL  Sleep:  Fair   Screenings: AIMS   Flowsheet Row Video Visit from 04/21/2020 in [place] Office Visit from 05/03/2016 in [facility] Admission (Discharged) from 01/15/2015 in [facility] Total Score 0 0 0    AUDIT   Flowsheet Row Admission (Discharged) from 01/15/2015 in [facility]  Alcohol Use Disorder Identification Test Final Score (AUDIT) 1    GAD-7   Flowsheet Row Office Visit from 04/18/2015 in [facility]  Total GAD-7 Score 14    PHQ2-9   Flowsheet Row Video Visit from 04/21/2020 in [place] Office Visit from 12/14/2019 in [facility] Office Visit from 04/18/2015 in [facility] Office Visit from 07/11/2014 in [facility]  PHQ-2 Total Score 0 0 4 0  PHQ-9 Total Score - - 14 -    Flowsheet Row Video Visit from 04/21/2020 in [place] No Risk       Assessment and Plan: Sheryl Perez is a 60 year old Caucasian female who has a history of bipolar disorder, cannabis use disorder, tobacco use disorder, psoriasis, arthritis was evaluated by telemedicine today.  Patient is currently making progress on the current medication regimen.  Plan as noted below.  Plan Bipolar disorder in remission Depakote 250 mg p.o. 3 times daily Depakote level-reviewed and discussed with patient-dated 02/22/2020-17-subtherapeutic Patient however reports even though the Depakote was prescribed to be taken 3 times a day she was using it orally twice a day.  She is more compliant with it now. Seroquel 50 mg p.o. nightly as needed.  Other specified anxiety disorder-with limited symptom attack-improving Propranolol 10 mg p.o. 3 times daily Hydroxyzine 10 mg p.o. twice  daily as needed for severe anxiety attacks only.  Patient advised to avoid using it with her antihistamine like Zyrtec.  Insomnia-stable Seroquel 50 mg p.o. nightly as needed  Cannabis use disorder in early remission Patient has been sober since the past several months.  Tobacco use disorder-improving Patient is cutting back.  Follow-up in clinic in 4 months or sooner if needed.   I have spent at least 20 minutes non face to face with patient today which includes the time spent for preparing to see the patient  (e.g., review of test, records), ordering medications and test, psychoeducation and supportive psychotherapy, care coordination as well as documenting clinical information in electronic health record.     This note was generated in part or whole with voice recognition software. Voice recognition is usually quite accurate but there are transcription errors that can and very often do occur. I apologize for any typographical errors that were not detected and corrected.        Ursula Alert, MD 04/21/2020, 5:13 PM

## 2020-04-22 ENCOUNTER — Encounter: Payer: Self-pay | Admitting: Internal Medicine

## 2020-04-22 ENCOUNTER — Ambulatory Visit: Payer: BC Managed Care – PPO | Admitting: Internal Medicine

## 2020-04-22 DIAGNOSIS — F3162 Bipolar disorder, current episode mixed, moderate: Secondary | ICD-10-CM

## 2020-04-22 DIAGNOSIS — J431 Panlobular emphysema: Secondary | ICD-10-CM | POA: Diagnosis not present

## 2020-04-22 DIAGNOSIS — F17200 Nicotine dependence, unspecified, uncomplicated: Secondary | ICD-10-CM

## 2020-04-22 DIAGNOSIS — I700 Atherosclerosis of aorta: Secondary | ICD-10-CM | POA: Diagnosis not present

## 2020-04-22 DIAGNOSIS — F418 Other specified anxiety disorders: Secondary | ICD-10-CM

## 2020-04-22 NOTE — Assessment & Plan Note (Signed)
Hypercholesterolemia  I advised the patient to follow Mediterranean diet This diet is rich in fruits vegetables and whole grain, and This diet is also rich in fish and lean meat Patient should also eat a handful of almonds or walnuts daily Recent heart study indicated that average follow-up on this kind of diet reduces the cardiovascular mortality by 50 to 70%== 

## 2020-04-22 NOTE — Progress Notes (Signed)
Established Patient Office Visit  Subjective:  Patient ID: Sheryl Perez, female    DOB: 1960-07-12  Age: 60 y.o. MRN: [id number]  CC:  Chief Complaint  Patient presents with  . mammogram results  . ct results    HPI  [name] presents for report of the CT scan. Past Medical History:  Diagnosis Date  . Back injury   . 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 Bipolar disorder ([place])   . Chest pain, unspecified   . Chronic low back pain 10/23/2013  . Chronic UTI   . Depression   . Dysfunction of eustachian tube   . Enlargement of lymph nodes   . History of Bell's palsy Right  . Injury, other and unspecified, knee, leg, ankle, and foot   . MRSA (methicillin resistant staph aureus) culture positive 07-11-14   abscess  . Other specified disease of hair and hair follicles   . Rectal prolapse   . Unspecified symptom associated with female genital organs     Past Surgical History:  Procedure Laterality Date  . ABDOMINAL HYSTERECTOMY    . bladder tack  2010  . CHOLECYSTECTOMY  2002  . FOOT SURGERY Bilateral 2004   bunion  . GANGLION CYST EXCISION  2002  . INCISION AND DRAINAGE ABSCESS  04-16-15  . PARTIAL HYSTERECTOMY  2010   uterus removed    Family History  Problem Relation Age of Onset  . Parkinson's disease Maternal Grandfather   . Diabetes Father   . Cancer Father        brain tumor  . Depression Father   . Diabetes Paternal Grandfather   . Colon polyps Mother   . Heart disease Maternal Grandmother   . Heart attack Maternal Grandmother   . Stroke Maternal Grandmother   . Depression Sister   . Alcohol abuse Brother   . Depression Brother   . Breast cancer Paternal Aunt   . Breast cancer Cousin     Social History   Socioeconomic History  . Marital status: Married    Spouse name: Not on file  . Number of children: 3  . Years of education: hs  . Highest education level: Some college, no degree  Occupational History  . Not on  file  Tobacco Use  . Smoking status: Current Every Day Smoker    Packs/day: 0.50    Years: 40.00    Pack years: 20.00    Types: Cigarettes  . Smokeless tobacco: Never Used  . Tobacco comment: reported on 11/07/2019  Vaping Use  . Vaping Use: Never used  Substance and Sexual Activity  . Alcohol use: No    Alcohol/week: 1.0 standard drink    Types: 1 Glasses of wine per week    Comment: occasional  . Drug use: Yes    Types: Marijuana    Comment: last used 05-20-15  . Sexual activity: Yes    Birth control/protection: None  Other Topics Concern  . Not on file  Social History Narrative   Patient lives at home with her husband [name]).   Patient works full time.   Education CNA    Right handed.   Caffeine Tea , Soda.   Social Determinants of Health   Financial Resource Strain: Not on file  Food Insecurity: Not on file  Transportation Needs: Not on file  Physical Activity: Not on file  Stress: Not on file  Social Connections: Not on file  Intimate Partner Violence: Not  on file     Current Outpatient Medications:  .  ADVAIR DISKUS 250-50 MCG/DOSE AEPB, SMARTSIG:2 Puff(s) By Mouth Twice Daily, Disp: , Rfl:  .  ALPRAZolam (XANAX) 0.25 MG tablet, Take 0.25 mg by mouth at bedtime., Disp: , Rfl: 0 .  Ascorbic Acid (VITAMIN C) 1000 MG tablet, Take 1,000 mg by mouth daily., Disp: , Rfl:  .  cyclobenzaprine (FLEXERIL) 5 MG tablet, Take 5 mg by mouth 2 (two) times daily at 10 AM and 5 PM. , Disp: , Rfl:  .  divalproex (DEPAKOTE) 250 MG DR tablet, Take 1 tablet (250 mg total) by mouth 3 (three) times daily., Disp: 270 tablet, Rfl: 1 .  hydrOXYzine (ATARAX/VISTARIL) 10 MG tablet, TAKE 1 TABLET (10 MG TOTAL) BY MOUTH 2 (TWO) TIMES DAILY AS NEEDED. FOR SEVERE ANXIETY ATTACKS ONLY, Disp: 180 tablet, Rfl: 1 .  KLOR-CON M20 20 MEQ tablet, Take 20 mEq by mouth 2 (two) times daily. , Disp: , Rfl: 4 .  megestrol (MEGACE) 400 MG/10ML suspension, Take 10 mLs (400 mg total) by mouth daily., Disp:  240 mL, Rfl: 0 .  Multiple Vitamin (MULTIVITAMIN) tablet, Take 1 tablet by mouth daily., Disp: , Rfl:  .  ondansetron (ZOFRAN-ODT) 4 MG disintegrating tablet, , Disp: , Rfl:  .  pantoprazole (PROTONIX) 40 MG tablet, , Disp: , Rfl:  .  Potassium Chloride ER 20 MEQ TBCR, , Disp: , Rfl:  .  propranolol (INDERAL) 20 MG tablet, Take 1 tablet (20 mg total) by mouth 3 (three) times daily., Disp: 270 tablet, Rfl: 1 .  QUEtiapine (SEROQUEL) 50 MG tablet, Take 1 tablet (50 mg total) by mouth at bedtime as needed., Disp: 90 tablet, Rfl: 0   Allergies  Allergen Reactions  . Sulfacetamide Sodium Swelling    Diarrhea,dizziness, tongue swelling  . Other Other (See Comments) and Swelling    PT states that anything with the sides effects "may cause rash" she cannot take due to her psoriasis, it will make the patches on her hand so thick she can't use her hands.  Diarrhea,dizziness, tongue swelling  . Sulfa Antibiotics Swelling    Diarrhea,dizziness, tongue swelling  . Abilify [Aripiprazole] Other (See Comments)  . Cephalosporins Rash  . Erythromycin Other (See Comments)    Abd. pain Abd. pain Other reaction(s): Other (See Comments) Abd. pain  . Lamotrigine Rash and Other (See Comments)    psorasis worsen psorasis worsen  . Risperidone Other (See Comments)    tremors tremors tremors    ROS Review of Systems  Constitutional: Negative.   HENT: Negative.   Eyes: Negative.   Respiratory: Negative.   Cardiovascular: Negative.   Gastrointestinal: Negative.   Endocrine: Negative.   Genitourinary: Negative.   Musculoskeletal: Negative.   Skin: Negative.   Allergic/Immunologic: Negative.   Neurological: Negative.   Hematological: Negative.   Psychiatric/Behavioral: Negative.   All other systems reviewed and are negative.     Objective:    Physical Exam Vitals reviewed.  Constitutional:      Appearance: Normal appearance.  HENT:     Mouth/Throat:     Mouth: Mucous membranes are  moist.  Eyes:     Pupils: Pupils are equal, round, and reactive to light.  Neck:     Vascular: No carotid bruit.  Cardiovascular:     Rate and Rhythm: Normal rate and regular rhythm.     Pulses: Normal pulses.     Heart sounds: Normal heart sounds.  Pulmonary:     Effort: Pulmonary effort  is normal.     Breath sounds: Normal breath sounds.  Chest:    Abdominal:     General: Bowel sounds are normal.     Palpations: Abdomen is soft. There is no hepatomegaly, splenomegaly or mass.     Tenderness: There is no abdominal tenderness.     Hernia: No hernia is present.  Musculoskeletal:        General: No tenderness.     Cervical back: Neck supple.     Right lower leg: No edema.     Left lower leg: No edema.  Skin:    Findings: No rash.  Neurological:     Mental Status: She is alert and oriented to person, place, and time.     Motor: No weakness.  Psychiatric:        Mood and Affect: Mood and affect normal.        Behavior: Behavior normal.     LMP 02/09/2008  Wt Readings from Last 3 Encounters:  03/07/20 125 lb (56.7 kg)  03/03/20 125 lb 11.2 oz (57 kg)  12/14/19 119 lb 3.2 oz (54.1 kg)     Health Maintenance Due  Topic Date Due  . Hepatitis C Screening  Never done  . COVID-19 Vaccine (1) Never done  . HIV Screening  Never done  . PAP SMEAR-Modifier  11/01/2016    There are no preventive care reminders to display for this patient.  Lab Results  Component Value Date   TSH 1.27 12/12/2019   Lab Results  Component Value Date   WBC 6.0 02/22/2020   HGB 11.6 02/22/2020   HCT 34.7 02/22/2020   MCV 98 (H) 02/22/2020   PLT 380 02/22/2020   Lab Results  Component Value Date   NA 138 02/22/2020   K 5.0 02/22/2020   CO2 21 02/22/2020   GLUCOSE 86 02/22/2020   BUN 10 02/22/2020   CREATININE 1.17 (H) 02/22/2020   BILITOT <0.2 02/22/2020   ALKPHOS 72 02/22/2020   AST 15 02/22/2020   ALT 13 02/22/2020   PROT 6.6 02/22/2020   ALBUMIN 4.2 02/22/2020   CALCIUM  9.1 02/22/2020   ANIONGAP 10 08/24/2018   Lab Results  Component Value Date   CHOL 197 12/12/2019   Lab Results  Component Value Date   HDL 51 12/12/2019   Lab Results  Component Value Date   LDLCALC 123 (H) 12/12/2019   Lab Results  Component Value Date   TRIG 121 12/12/2019   Lab Results  Component Value Date   CHOLHDL 3.9 12/12/2019   Lab Results  Component Value Date   HGBA1C 5.3 08/24/2018      Assessment & Plan:   Problem List Items Addressed This Visit      Cardiovascular and Mediastinum   Atherosclerosis of aorta (HCC)    Hypercholesterolemia  I advised the patient to follow Mediterranean diet This diet is rich in fruits vegetables and whole grain, and This diet is also rich in fish and lean meat Patient should also eat a handful of almonds or walnuts daily Recent heart study indicated that average follow-up on this kind of diet reduces the cardiovascular mortality by 50 to 70%==        Respiratory   Panlobular emphysema ([place])    Patient was advised to have a CT scan once a year because of abnormal nodular shadow detected on the recent CTshe also has panlobular emphysema so I advised her to quit smoking.  Other   Tobacco use disorder - Primary    - I instructed the patient to stop smoking and provided them with smoking cessation materials.  - I informed the patient that smoking puts them at increased risk for cancer, COPD, hypertension, and more.  - Informed the patient to seek help if they begin to have trouble breathing, develop chest pain, start to cough up blood, feel faint, or pass out.      Bipolar 1 disorder, mixed, moderate (HCC)    Stable at the present time      Other specified anxiety disorders    - Patient experiencing high levels of anxiety.  - Encouraged patient to engage in relaxing activities like yoga, meditation, journaling, going for a walk, or participating in a hobby.  - Encouraged patient to reach out to trusted  friends or family members about recent struggles         No orders of the defined types were placed in this encounter.   Follow-up: No follow-ups on file.    Cletis Athens, MD

## 2020-04-22 NOTE — Assessment & Plan Note (Signed)
Stable at the present time. 

## 2020-04-22 NOTE — Assessment & Plan Note (Signed)
-   I instructed the patient to stop smoking and provided them with smoking cessation materials.  - I informed the patient that smoking puts them at increased risk for cancer, COPD, hypertension, and more.  - Informed the patient to seek help if they begin to have trouble breathing, develop chest pain, start to cough up blood, feel faint, or pass out.  

## 2020-04-22 NOTE — Assessment & Plan Note (Signed)
-   Patient experiencing high levels of anxiety.  - Encouraged patient to engage in relaxing activities like yoga, meditation, journaling, going for a walk, or participating in a hobby.  - Encouraged patient to reach out to trusted friends or family members about recent struggles 

## 2020-04-22 NOTE — Assessment & Plan Note (Signed)
Patient was advised to have a CT scan once a year because of abnormal nodular shadow detected on the recent CTshe also has panlobular emphysema so I advised her to quit smoking.

## 2020-05-01 ENCOUNTER — Other Ambulatory Visit: Payer: Self-pay | Admitting: Internal Medicine

## 2020-05-01 ENCOUNTER — Other Ambulatory Visit: Payer: Self-pay | Admitting: *Deleted

## 2020-05-01 MED ORDER — ALBUTEROL SULFATE 108 (90 BASE) MCG/ACT IN AEPB: 1.0000 | INHALATION_SPRAY | Freq: Two times a day (BID) | RESPIRATORY_TRACT | 4 refills | Status: DC

## 2020-05-13 ENCOUNTER — Other Ambulatory Visit: Payer: Self-pay

## 2020-05-13 ENCOUNTER — Ambulatory Visit (INDEPENDENT_AMBULATORY_CARE_PROVIDER_SITE_OTHER): Payer: BC Managed Care – PPO | Admitting: Internal Medicine

## 2020-05-13 ENCOUNTER — Encounter: Payer: Self-pay | Admitting: Internal Medicine

## 2020-05-13 VITALS — BP 131/64 | HR 71 | Ht 60.0 in | Wt 122.7 lb

## 2020-05-13 DIAGNOSIS — J3089 Other allergic rhinitis: Secondary | ICD-10-CM

## 2020-05-13 DIAGNOSIS — I700 Atherosclerosis of aorta: Secondary | ICD-10-CM | POA: Diagnosis not present

## 2020-05-13 DIAGNOSIS — F17200 Nicotine dependence, unspecified, uncomplicated: Secondary | ICD-10-CM

## 2020-05-13 DIAGNOSIS — G8929 Other chronic pain: Secondary | ICD-10-CM

## 2020-05-13 DIAGNOSIS — J431 Panlobular emphysema: Secondary | ICD-10-CM

## 2020-05-13 DIAGNOSIS — M545 Low back pain, unspecified: Secondary | ICD-10-CM

## 2020-05-13 NOTE — Assessment & Plan Note (Signed)
-   Patient's back pain is under control with medication.  - Encouraged the patient to stretch or do yoga as able to help with back pain 

## 2020-05-13 NOTE — Progress Notes (Signed)
Established Patient Office Visit  Subjective:  Patient ID: Sheryl Perez, female    DOB: Oct 28, 1960  Age: 60 y.o. MRN: [id number]  CC:  Chief Complaint  Patient presents with  . Lab Results    HPI  Sheryl Perez Patient is known  to have allergic rhinitis emphysema chronic back pain morbid obesity disorder She continues to smoke in spite of repeated advised to stop it. Her bipolar disorder is stable  Past Medical History:  Diagnosis Date  . Back injury   . 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 Bipolar disorder ([place])   . Chest pain, unspecified   . Chronic low back pain 10/23/2013  . Chronic UTI   . Depression   . Dysfunction of eustachian tube   . Enlargement of lymph nodes   . History of Bell's palsy Right  . Injury, other and unspecified, knee, leg, ankle, and foot   . MRSA (methicillin resistant staph aureus) culture positive 07-11-14   abscess  . Other specified disease of hair and hair follicles   . Rectal prolapse   . Unspecified symptom associated with female genital organs     Past Surgical History:  Procedure Laterality Date  . ABDOMINAL HYSTERECTOMY    . bladder tack  2010  . CHOLECYSTECTOMY  2002  . FOOT SURGERY Bilateral 2004   bunion  . GANGLION CYST EXCISION  2002  . INCISION AND DRAINAGE ABSCESS  04-16-15  . PARTIAL HYSTERECTOMY  2010   uterus removed    Family History  Problem Relation Age of Onset  . Parkinson's disease Maternal Grandfather   . Diabetes Father   . Cancer Father        brain tumor  . Depression Father   . Diabetes Paternal Grandfather   . Colon polyps Mother   . Heart disease Maternal Grandmother   . Heart attack Maternal Grandmother   . Stroke Maternal Grandmother   . Depression Sister   . Alcohol abuse Brother   . Depression Brother   . Breast cancer Paternal Aunt   . Breast cancer Cousin     Social History   Socioeconomic History  . Marital status: Married    Spouse name: Not on file  .  Number of children: 3  . Years of education: hs  . Highest education level: Some college, no degree  Occupational History  . Not on file  Tobacco Use  . Smoking status: Current Every Day Smoker    Packs/day: 0.50    Years: 40.00    Pack years: 20.00    Types: Cigarettes  . Smokeless tobacco: Never Used  . Tobacco comment: reported on 11/07/2019  Vaping Use  . Vaping Use: Never used  Substance and Sexual Activity  . Alcohol use: No    Alcohol/week: 1.0 standard drink    Types: 1 Glasses of wine per week    Comment: occasional  . Drug use: Yes    Types: Marijuana    Comment: last used 05-20-15  . Sexual activity: Yes    Birth control/protection: None  Other Topics Concern  . Not on file  Social History Narrative   Patient lives at home with her husband [name]).   Patient works full time.   Education CNA    Right handed.   Caffeine Tea , Soda.   Social Determinants of Health   Financial Resource Strain: Not on file  Food Insecurity: Not on file  Transportation Needs: Not on file  Physical Activity: Not on file  Stress: Not on file  Social Connections: Not on file  Intimate Partner Violence: Not on file     Current Outpatient Medications:  .  ADVAIR DISKUS 250-50 MCG/DOSE AEPB, SMARTSIG:2 Puff(s) By Mouth Twice Daily, Disp: , Rfl:  .  albuterol (VENTOLIN HFA) 108 (90 Base) MCG/ACT inhaler, Inhale 2 puffs into the lungs every 4 (four) hours as needed for wheezing or shortness of breath., Disp: 8 g, Rfl: 4 .  ALPRAZolam (XANAX) 0.25 MG tablet, Take 0.25 mg by mouth at bedtime., Disp: , Rfl: 0 .  Ascorbic Acid (VITAMIN C) 1000 MG tablet, Take 1,000 mg by mouth daily., Disp: , Rfl:  .  cyclobenzaprine (FLEXERIL) 5 MG tablet, Take 5 mg by mouth 2 (two) times daily at 10 AM and 5 PM. , Disp: , Rfl:  .  divalproex (DEPAKOTE) 250 MG DR tablet, Take 1 tablet (250 mg total) by mouth 3 (three) times daily., Disp: 270 tablet, Rfl: 1 .  hydrOXYzine (ATARAX/VISTARIL) 10 MG tablet,  TAKE 1 TABLET (10 MG TOTAL) BY MOUTH 2 (TWO) TIMES DAILY AS NEEDED. FOR SEVERE ANXIETY ATTACKS ONLY, Disp: 180 tablet, Rfl: 1 .  KLOR-CON M20 20 MEQ tablet, Take 20 mEq by mouth 2 (two) times daily. , Disp: , Rfl: 4 .  megestrol (MEGACE) 400 MG/10ML suspension, Take 10 mLs (400 mg total) by mouth daily., Disp: 240 mL, Rfl: 0 .  Multiple Vitamin (MULTIVITAMIN) tablet, Take 1 tablet by mouth daily., Disp: , Rfl:  .  ondansetron (ZOFRAN-ODT) 4 MG disintegrating tablet, , Disp: , Rfl:  .  pantoprazole (PROTONIX) 40 MG tablet, , Disp: , Rfl:  .  Potassium Chloride ER 20 MEQ TBCR, , Disp: , Rfl:  .  propranolol (INDERAL) 20 MG tablet, Take 1 tablet (20 mg total) by mouth 3 (three) times daily., Disp: 270 tablet, Rfl: 1 .  QUEtiapine (SEROQUEL) 50 MG tablet, Take 1 tablet (50 mg total) by mouth at bedtime as needed., Disp: 90 tablet, Rfl: 0   Allergies  Allergen Reactions  . Sulfacetamide Sodium Swelling    Diarrhea,dizziness, tongue swelling  . Other Other (See Comments) and Swelling    PT states that anything with the sides effects "may cause rash" she cannot take due to her psoriasis, it will make the patches on her hand so thick she can't use her hands.  Diarrhea,dizziness, tongue swelling  . Sulfa Antibiotics Swelling    Diarrhea,dizziness, tongue swelling  . Abilify [Aripiprazole] Other (See Comments)  . Cephalosporins Rash  . Erythromycin Other (See Comments)    Abd. pain Abd. pain Other reaction(s): Other (See Comments) Abd. pain  . Lamotrigine Rash and Other (See Comments)    psorasis worsen psorasis worsen  . Risperidone Other (See Comments)    tremors tremors tremors    ROS Review of Systems  Constitutional: Negative.   HENT: Negative.   Eyes: Negative.   Respiratory: Negative.   Cardiovascular: Negative.   Gastrointestinal: Negative.   Endocrine: Negative.   Genitourinary: Negative.   Musculoskeletal: Negative.   Skin: Negative.   Allergic/Immunologic: Negative.    Neurological: Negative.   Hematological: Negative.   Psychiatric/Behavioral: Negative.   All other systems reviewed and are negative.     Objective:    Physical Exam Vitals reviewed.  Constitutional:      Appearance: Normal appearance.  HENT:     Mouth/Throat:     Mouth: Mucous membranes are moist.  Eyes:     Pupils: Pupils are equal,  round, and reactive to light.  Neck:     Vascular: No carotid bruit.  Cardiovascular:     Rate and Rhythm: Normal rate and regular rhythm.     Pulses: Normal pulses.     Heart sounds: Normal heart sounds.  Pulmonary:     Effort: Pulmonary effort is normal.     Breath sounds: Normal breath sounds.  Abdominal:     General: Bowel sounds are normal.     Palpations: Abdomen is soft. There is no hepatomegaly, splenomegaly or mass.     Tenderness: There is no abdominal tenderness.     Hernia: No hernia is present.  Musculoskeletal:        General: No tenderness.     Cervical back: Neck supple.     Right lower leg: No edema.     Left lower leg: No edema.  Skin:    Findings: No rash.  Neurological:     Mental Status: She is alert and oriented to person, place, and time.     Motor: No weakness.  Psychiatric:        Mood and Affect: Mood and affect normal.        Behavior: Behavior normal.     BP 131/64   Pulse 71   Ht 5' (1.524 m)   Wt 122 lb 11.2 oz (55.7 kg)   LMP 02/09/2008   BMI 23.96 kg/m  Wt Readings from Last 3 Encounters:  05/13/20 122 lb 11.2 oz (55.7 kg)  03/07/20 125 lb (56.7 kg)  03/03/20 125 lb 11.2 oz (57 kg)     Health Maintenance Due  Topic Date Due  . Hepatitis C Screening  Never done  . COVID-19 Vaccine (1) Never done  . HIV Screening  Never done  . PAP SMEAR-Modifier  11/01/2016    There are no preventive care reminders to display for this patient.  Lab Results  Component Value Date   TSH 1.27 12/12/2019   Lab Results  Component Value Date   WBC 6.0 02/22/2020   HGB 11.6 02/22/2020   HCT 34.7  02/22/2020   MCV 98 (H) 02/22/2020   PLT 380 02/22/2020   Lab Results  Component Value Date   NA 138 02/22/2020   K 5.0 02/22/2020   CO2 21 02/22/2020   GLUCOSE 86 02/22/2020   BUN 10 02/22/2020   CREATININE 1.17 (H) 02/22/2020   BILITOT <0.2 02/22/2020   ALKPHOS 72 02/22/2020   AST 15 02/22/2020   ALT 13 02/22/2020   PROT 6.6 02/22/2020   ALBUMIN 4.2 02/22/2020   CALCIUM 9.1 02/22/2020   ANIONGAP 10 08/24/2018   Lab Results  Component Value Date   CHOL 197 12/12/2019   Lab Results  Component Value Date   HDL 51 12/12/2019   Lab Results  Component Value Date   LDLCALC 123 (H) 12/12/2019   Lab Results  Component Value Date   TRIG 121 12/12/2019   Lab Results  Component Value Date   CHOLHDL 3.9 12/12/2019   Lab Results  Component Value Date   HGBA1C 5.3 08/24/2018      Assessment & Plan:   Problem List Items Addressed This Visit      Cardiovascular and Mediastinum   Atherosclerosis of aorta ([place]) - Primary    Patient was advised to continue taking her statin.        Respiratory   Allergic rhinitis    Take Claritin 10 mg p.o. daily.  For 30 days  Panlobular emphysema ([place])    Patient was advised to quit smoking.        Other   Chronic low back pain (Chronic)    - Patient's back pain is under control with medication.  - Encouraged the patient to stretch or do yoga as able to help with back pain      Tobacco use disorder    - I instructed the patient to stop smoking and provided them with smoking cessation materials.  - I informed the patient that smoking puts them at increased risk for cancer, COPD, hypertension, and more.  - Informed the patient to seek help if they begin to have trouble breathing, develop chest pain, start to cough up blood, feel faint, or pass out.         No orders of the defined types were placed in this encounter.   Follow-up: No follow-ups on file.    Cletis Athens, MD

## 2020-05-13 NOTE — Assessment & Plan Note (Signed)
-   I instructed the patient to stop smoking and provided them with smoking cessation materials.  - I informed the patient that smoking puts them at increased risk for cancer, COPD, hypertension, and more.  - Informed the patient to seek help if they begin to have trouble breathing, develop chest pain, start to cough up blood, feel faint, or pass out.  

## 2020-05-13 NOTE — Assessment & Plan Note (Signed)
Take Claritin 10 mg p.o. daily.  For 30 days

## 2020-05-13 NOTE — Assessment & Plan Note (Signed)
Patient was advised to continue taking her statin.

## 2020-05-13 NOTE — Assessment & Plan Note (Signed)
Patient was advised to quit smoking 

## 2020-05-16 ENCOUNTER — Other Ambulatory Visit: Payer: Self-pay

## 2020-05-19 ENCOUNTER — Emergency Department
Admission: EM | Admit: 2020-05-19 | Discharge: 2020-05-19 | Disposition: A | Discharge status: Home / Self Care | Payer: BC Managed Care – PPO | Attending: Emergency Medicine | Admitting: Emergency Medicine

## 2020-05-19 ENCOUNTER — Other Ambulatory Visit: Payer: Self-pay

## 2020-05-19 ENCOUNTER — Emergency Department: Payer: BC Managed Care – PPO

## 2020-05-19 ENCOUNTER — Encounter: Payer: Self-pay | Admitting: Emergency Medicine

## 2020-05-19 DIAGNOSIS — F17210 Nicotine dependence, cigarettes, uncomplicated: Secondary | ICD-10-CM | POA: Insufficient documentation

## 2020-05-19 DIAGNOSIS — R059 Cough, unspecified: Secondary | ICD-10-CM | POA: Diagnosis not present

## 2020-05-19 DIAGNOSIS — J309 Allergic rhinitis, unspecified: Secondary | ICD-10-CM | POA: Diagnosis not present

## 2020-05-19 DIAGNOSIS — Z8522 Personal history of malignant neoplasm of nasal cavities, middle ear, and accessory sinuses: Secondary | ICD-10-CM | POA: Insufficient documentation

## 2020-05-19 DIAGNOSIS — J40 Bronchitis, not specified as acute or chronic: Secondary | ICD-10-CM | POA: Diagnosis not present

## 2020-05-19 DIAGNOSIS — Z79899 Other long term (current) drug therapy: Secondary | ICD-10-CM | POA: Insufficient documentation

## 2020-05-19 DIAGNOSIS — J449 Chronic obstructive pulmonary disease, unspecified: Secondary | ICD-10-CM | POA: Insufficient documentation

## 2020-05-19 DIAGNOSIS — R0602 Shortness of breath: Secondary | ICD-10-CM | POA: Diagnosis not present

## 2020-05-19 DIAGNOSIS — R001 Bradycardia, unspecified: Secondary | ICD-10-CM | POA: Diagnosis not present

## 2020-05-19 DIAGNOSIS — J302 Other seasonal allergic rhinitis: Secondary | ICD-10-CM | POA: Diagnosis not present

## 2020-05-19 HISTORY — DX: Chronic obstructive pulmonary disease, unspecified: J44.9

## 2020-05-19 MED ORDER — GUAIFENESIN ER 600 MG PO TB12: 600.0000 mg | ORAL_TABLET | Freq: Two times a day (BID) | ORAL | 0 refills | Status: AC

## 2020-05-19 MED ORDER — IPRATROPIUM-ALBUTEROL 0.5-2.5 (3) MG/3ML IN SOLN
3.0000 mL | Freq: Once | RESPIRATORY_TRACT | Status: AC
  Administered 2020-05-19: 3 mL via RESPIRATORY_TRACT
  Filled 2020-05-19: qty 3

## 2020-05-19 MED ORDER — FLUTICASONE PROPIONATE 50 MCG/ACT NA SUSP: 1.0000 | Freq: Every day | NASAL | 2 refills | Status: DC

## 2020-05-19 NOTE — ED Triage Notes (Signed)
C/O sinus drainage since Friday. States has history of allergies, has taken zyrtec and Claritin with no relief.

## 2020-05-19 NOTE — ED Provider Notes (Signed)
[facility] Emergency Department Provider Note   ____________________________________________   Event Date/Time   First MD Initiated Contact with Patient 05/19/20 1059     (approximate)  I have reviewed the triage vital signs and the nursing notes.   HISTORY  Chief Complaint Nasal Congestion    HPI Sheryl Perez is a 60 y.o. female with possible history of bipolar disorder and COPD who presents to the ED complaining of congestion and cough.  Patient reports that she was at a funeral outdoors 2 days ago and exposed to a large amount of pollen.  Since then she has been dealing with a cough productive of clear sputum, nasal drainage, and pressure around her sinuses.  She describes some tightness in her chest but no overt chest pain and has not had any fevers.  She complains of some mild difficulty breathing at times but none currently.  She has been using her inhaler with partial relief, describes current symptoms as similar to prior flare of seasonal allergies and COPD.        Past Medical History:  Diagnosis Date  . Back injury   . 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 Bipolar disorder ([place])   . Chest pain, unspecified   . Chronic low back pain 10/23/2013  . Chronic UTI   . COPD (chronic obstructive pulmonary disease) ([place])   . Depression   . Dysfunction of eustachian tube   . Enlargement of lymph nodes   . History of Bell's palsy Right  . Injury, other and unspecified, knee, leg, ankle, and foot   . MRSA (methicillin resistant staph aureus) culture positive 07-11-14   abscess  . Other specified disease of hair and hair follicles   . Rectal prolapse   . Unspecified symptom associated with female genital organs     Patient Active Problem List   Diagnosis Date Noted  . Atherosclerosis of aorta ([place]) 04/22/2020  . Panlobular emphysema ([place]) 04/22/2020  . Hyperkalemia 03/03/2020  . Suspected COVID-19 virus infection 03/03/2020   . Other specified anxiety disorders 02/29/2020  . Annual physical exam 12/14/2019  . Abrasion of right arm 12/14/2019  . Cannabis use disorder, moderate, in early remission ([place]) 05/24/2019  . Bipolar disorder, in full remission, most recent episode mixed ([place]) 05/01/2019  . High risk medication use 05/01/2019  . Bipolar 1 disorder, mixed, moderate ([place]) 08/10/2018  . Allergic rhinitis 03/18/2016  . Abscess, axilla 04/16/2015  . Bipolar disorder, manic ([place]) 01/15/2015  . Cannabis use disorder, moderate, dependence ([place]) 01/14/2015  . Tobacco use disorder 07/11/2014  . Chronic low back pain 10/23/2013  . Borderline personality disorder ([place]) 07/28/2012  . Bipolar I disorder, most recent episode (or current) manic ([place]) 07/28/2012    Past Surgical History:  Procedure Laterality Date  . ABDOMINAL HYSTERECTOMY    . bladder tack  2010  . CHOLECYSTECTOMY  2002  . FOOT SURGERY Bilateral 2004   bunion  . GANGLION CYST EXCISION  2002  . INCISION AND DRAINAGE ABSCESS  04-16-15  . PARTIAL HYSTERECTOMY  2010   uterus removed    Prior to Admission medications   Medication Sig Start Date End Date Taking? Authorizing Provider  fluticasone (FLONASE) 50 MCG/ACT nasal spray Place 1 spray into both nostrils daily. 05/19/20 05/19/21 Yes Blake Divine, MD  guaiFENesin (MUCINEX) 600 MG 12 hr tablet Take 1 tablet (600 mg total) by mouth 2 (two) times daily for 7 days. 05/19/20 05/26/20 Yes Blake Divine, MD  ADVAIR DISKUS 250-50 MCG/DOSE AEPB SMARTSIG:2 Puff(s) By Mouth Twice Daily 04/17/19   [provider]  albuterol (VENTOLIN HFA) 108 (90 Base) MCG/ACT inhaler Inhale 2 puffs into the lungs every 4 (four) hours as needed for wheezing or shortness of breath. 05/01/20   Cletis Athens, MD  ALPRAZolam Duanne Moron) 0.25 MG tablet Take 0.25 mg by mouth at bedtime. 05/26/17   [provider]  Ascorbic Acid (VITAMIN C) 1000 MG tablet Take 1,000 mg by mouth daily.    [provider]   cyclobenzaprine (FLEXERIL) 5 MG tablet Take 5 mg by mouth 2 (two) times daily at 10 AM and 5 PM.     [provider]  divalproex (DEPAKOTE) 250 MG DR tablet Take 1 tablet (250 mg total) by mouth 3 (three) times daily. 04/21/20   Ursula Alert, MD  hydrOXYzine (ATARAX/VISTARIL) 10 MG tablet TAKE 1 TABLET (10 MG TOTAL) BY MOUTH 2 (TWO) TIMES DAILY AS NEEDED. FOR SEVERE ANXIETY ATTACKS ONLY 03/24/20   Ursula Alert, MD  KLOR-CON M20 20 MEQ tablet Take 20 mEq by mouth 2 (two) times daily.  05/26/17   [provider]  megestrol (MEGACE) 400 MG/10ML suspension Take 10 mLs (400 mg total) by mouth daily. 04/12/17   Ursula Alert, MD  Multiple Vitamin (MULTIVITAMIN) tablet Take 1 tablet by mouth daily.    [provider]  ondansetron (ZOFRAN-ODT) 4 MG disintegrating tablet  07/31/18   [provider]  pantoprazole (PROTONIX) 40 MG tablet  07/31/18   [provider]  Potassium Chloride ER 20 MEQ TBCR  08/08/18   [provider]  propranolol (INDERAL) 20 MG tablet Take 1 tablet (20 mg total) by mouth 3 (three) times daily. 04/21/20   Ursula Alert, MD  QUEtiapine (SEROQUEL) 50 MG tablet Take 1 tablet (50 mg total) by mouth at bedtime as needed. 11/07/19   Ursula Alert, MD    Allergies Sulfacetamide sodium, Other, Sulfa antibiotics, Abilify [aripiprazole], Cephalosporins, Erythromycin, Lamotrigine, and Risperidone  Family History  Problem Relation Age of Onset  . Parkinson's disease Maternal Grandfather   . Diabetes Father   . Cancer Father        brain tumor  . Depression Father   . Diabetes Paternal Grandfather   . Colon polyps Mother   . Heart disease Maternal Grandmother   . Heart attack Maternal Grandmother   . Stroke Maternal Grandmother   . Depression Sister   . Alcohol abuse Brother   . Depression Brother   . Breast cancer Paternal Aunt   . Breast cancer Cousin     Social History Social History   Tobacco Use  . Smoking  status: Current Every Day Smoker    Packs/day: 0.50    Years: 40.00    Pack years: 20.00    Types: Cigarettes  . Smokeless tobacco: Never Used  . Tobacco comment: reported on 11/07/2019  Vaping Use  . Vaping Use: Never used  Substance Use Topics  . Alcohol use: No    Alcohol/week: 1.0 standard drink    Types: 1 Glasses of wine per week    Comment: occasional  . Drug use: Yes    Types: Marijuana    Comment: last used 05-20-15    Review of Systems  Constitutional: No fever/chills Eyes: No visual changes. ENT: No sore throat.  Positive for rhinorrhea and sinus pressure. Cardiovascular: Positive for chest tightness. Respiratory: Positive for cough and shortness of breath. Gastrointestinal: No abdominal pain.  No nausea, no vomiting.  No diarrhea.  No constipation. Genitourinary: Negative for dysuria. Musculoskeletal: Negative for back pain. Skin: Negative for rash. Neurological: Negative for headaches, focal weakness or numbness.  ____________________________________________   PHYSICAL EXAM:  VITAL SIGNS: ED Triage Vitals  Enc Vitals Group     BP 05/19/20 1037 120/62     Pulse Rate 05/19/20 1037 63     Resp 05/19/20 1037 20     Temp 05/19/20 1037 97.7 F (36.5 C)     Temp Source 05/19/20 1037 Oral     SpO2 05/19/20 1037 98 %     Weight 05/19/20 1029 122 lb (55.3 kg)     Height 05/19/20 1029 5' (1.524 m)     Head Circumference --      Peak Flow --      Pain Score 05/19/20 1029 0     Pain Loc --      Pain Edu? --      Excl. in [place]? --     Constitutional: Alert and oriented. Eyes: Conjunctivae are normal. Head: Atraumatic. Nose: No congestion/rhinnorhea. Mouth/Throat: Mucous membranes are moist. Neck: Normal ROM Cardiovascular: Normal rate, regular rhythm. Grossly normal heart sounds. Respiratory: Normal respiratory effort.  No retractions. Lungs CTAB. Gastrointestinal: Soft and nontender. No distention. Genitourinary: deferred Musculoskeletal: No lower  extremity tenderness nor edema. Neurologic:  Normal speech and language. No gross focal neurologic deficits are appreciated. Skin:  Skin is warm, dry and intact. No rash noted. Psychiatric: Mood and affect are normal. Speech and behavior are normal.  ____________________________________________   LABS (all labs ordered are listed, but only abnormal results are displayed)  Labs Reviewed - No data to display ____________________________________________  EKG  ED ECG REPORT I, Blake Divine, the attending physician, personally viewed and interpreted this ECG.   Date: 05/19/2020  EKG Time: 11:39  Rate: 55  Rhythm: sinus bradycardia  Axis: Normal  Intervals:none  ST&T Change: None   PROCEDURES  Procedure(s) performed (including Critical Care):  Procedures   ____________________________________________   INITIAL IMPRESSION / ASSESSMENT AND PLAN / ED COURSE       60 year old female with past medical history of bipolar disorder and COPD who presents to the ED complaining of nasal drainage, sinus pressure, and chest tightness after being exposed to a large amount of pollen a couple of days ago.  She is not in any respiratory distress and is maintaining O2 sats on room air, no significant wheezing noted on exam.  We will screen EKG and chest x-ray, treat symptomatically with DuoNeb.  If work-up is unremarkable, patient would be appropriate for discharge home with symptomatic treatment for acute on chronic bronchitis as well as sinusitis.  EKG shows no evidence of arrhythmia or ischemia, chest x-ray reviewed by me and shows no infiltrate, edema, or effusion.  Patient reports feeling better following DuoNeb, continues to breathe comfortably on my exam.  She is appropriate for discharge home with symptomatic treatment, was counseled to follow-up with her PCP and to return to the ED for new worsening symptoms.  Patient agrees with plan.       ____________________________________________   FINAL CLINICAL IMPRESSION(S) / ED DIAGNOSES  Final diagnoses:  Allergic rhinitis, unspecified seasonality, unspecified trigger  Bronchitis     ED Discharge Orders         Ordered    guaiFENesin (MUCINEX) 600 MG 12 hr tablet  2 times daily        05/19/20 1207    fluticasone (FLONASE) 50 MCG/ACT nasal spray  Daily  05/19/20 1207           Note:  This document was prepared using Dragon voice recognition software and may include unintentional dictation errors.   Blake Divine, MD 05/19/20 1208

## 2020-05-19 NOTE — ED Notes (Signed)
See triage note  Presents with nasal congestion and slight cough    No fever

## 2020-05-19 NOTE — ED Triage Notes (Signed)
First Nurse Note:  AAOx3.  Skin warm and dry.  Ambulates without difficulty.  No SOB/ DOE.  Voice clear and strong.  NAD.  Speaking in full sentences.

## 2020-05-19 NOTE — ED Notes (Signed)
Pt presents to the ED for nasal congestion since Friday. Denies cough. Denies CP/SOB. Pt has a hx of seasonal allergies. Pt is A&Ox4 and NAD.

## 2020-05-29 ENCOUNTER — Encounter: Payer: Self-pay | Admitting: Internal Medicine

## 2020-05-29 ENCOUNTER — Ambulatory Visit: Payer: BC Managed Care – PPO | Admitting: Internal Medicine

## 2020-05-29 ENCOUNTER — Other Ambulatory Visit: Payer: Self-pay

## 2020-05-29 VITALS — BP 119/61 | HR 65 | Ht 60.0 in | Wt 124.7 lb

## 2020-05-29 DIAGNOSIS — R5383 Other fatigue: Secondary | ICD-10-CM

## 2020-05-29 DIAGNOSIS — J3089 Other allergic rhinitis: Secondary | ICD-10-CM

## 2020-05-29 DIAGNOSIS — F3162 Bipolar disorder, current episode mixed, moderate: Secondary | ICD-10-CM | POA: Diagnosis not present

## 2020-05-29 DIAGNOSIS — J0101 Acute recurrent maxillary sinusitis: Secondary | ICD-10-CM

## 2020-05-29 DIAGNOSIS — F17200 Nicotine dependence, unspecified, uncomplicated: Secondary | ICD-10-CM | POA: Diagnosis not present

## 2020-05-29 MED ORDER — LEVOFLOXACIN 500 MG PO TABS: 500.0000 mg | ORAL_TABLET | Freq: Every day | ORAL | 0 refills | Status: AC

## 2020-05-29 MED ORDER — CYANOCOBALAMIN 1000 MCG/ML IJ SOLN
1000.0000 ug | Freq: Once | INTRAMUSCULAR | Status: AC
  Administered 2020-05-29: 1000 ug via INTRAMUSCULAR

## 2020-05-29 MED ORDER — METHYLPREDNISOLONE 4 MG PO TBPK: ORAL_TABLET | ORAL | 0 refills | Status: DC

## 2020-05-29 MED ORDER — TRIAMCINOLONE ACETONIDE 40 MG/ML IJ SUSP
40.0000 mg | Freq: Once | INTRAMUSCULAR | Status: AC
Start: 2020-05-29 — End: 2020-05-29
  Administered 2020-05-29: 40 mg via INTRAMUSCULAR

## 2020-05-29 NOTE — Assessment & Plan Note (Signed)
Patient was advised to take Claritin 10 mg daily

## 2020-05-29 NOTE — Assessment & Plan Note (Signed)
Stable at the present time. 

## 2020-05-29 NOTE — Progress Notes (Signed)
Established Patient Office Visit  Subjective:  Patient ID: Sheryl Perez, female    DOB: April 21, 1960  Age: 60 y.o. MRN: [id number]  CC:  Chief Complaint  Patient presents with  . Sleeping Problem    Pt having problems sleeping     HPI  Sheryl Perez presents for check up, complaining of sore throat upper respiratory infection, patient is known to have bipolar disorder she is a smoker also has a chronic back pain.  She has been seen in the hospital recently she also has a problem with pain in the ears   Past Medical History:  Diagnosis Date  . Back injury   . 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 Bipolar disorder ([place])   . Chest pain, unspecified   . Chronic low back pain 10/23/2013  . Chronic UTI   . COPD (chronic obstructive pulmonary disease) ([place])   . Depression   . Dysfunction of eustachian tube   . Enlargement of lymph nodes   . History of Bell's palsy Right  . Injury, other and unspecified, knee, leg, ankle, and foot   . MRSA (methicillin resistant staph aureus) culture positive 07-11-14   abscess  . Other specified disease of hair and hair follicles   . Rectal prolapse   . Unspecified symptom associated with female genital organs     Past Surgical History:  Procedure Laterality Date  . ABDOMINAL HYSTERECTOMY    . bladder tack  2010  . CHOLECYSTECTOMY  2002  . FOOT SURGERY Bilateral 2004   bunion  . GANGLION CYST EXCISION  2002  . INCISION AND DRAINAGE ABSCESS  04-16-15  . PARTIAL HYSTERECTOMY  2010   uterus removed    Family History  Problem Relation Age of Onset  . Parkinson's disease Maternal Grandfather   . Diabetes Father   . Cancer Father        brain tumor  . Depression Father   . Diabetes Paternal Grandfather   . Colon polyps Mother   . Heart disease Maternal Grandmother   . Heart attack Maternal Grandmother   . Stroke Maternal Grandmother   . Depression Sister   . Alcohol abuse Brother   . Depression Brother   . Breast  cancer Paternal Aunt   . Breast cancer Cousin     Social History   Socioeconomic History  . Marital status: Married    Spouse name: Not on file  . Number of children: 3  . Years of education: hs  . Highest education level: Some college, no degree  Occupational History  . Not on file  Tobacco Use  . Smoking status: Current Every Day Smoker    Packs/day: 0.50    Years: 40.00    Pack years: 20.00    Types: Cigarettes  . Smokeless tobacco: Never Used  . Tobacco comment: reported on 11/07/2019  Vaping Use  . Vaping Use: Never used  Substance and Sexual Activity  . Alcohol use: No    Alcohol/week: 1.0 standard drink    Types: 1 Glasses of wine per week    Comment: occasional  . Drug use: Yes    Types: Marijuana    Comment: last used 05-20-15  . Sexual activity: Yes    Birth control/protection: None  Other Topics Concern  . Not on file  Social History Narrative   Patient lives at home with her husband [name]).   Patient works full time.   Education CNA    Right  handed.   Caffeine Tea , Soda.   Social Determinants of Health   Financial Resource Strain: Not on file  Food Insecurity: Not on file  Transportation Needs: Not on file  Physical Activity: Not on file  Stress: Not on file  Social Connections: Not on file  Intimate Partner Violence: Not on file     Current Outpatient Medications:  .  levofloxacin (LEVAQUIN) 500 MG tablet, Take 1 tablet (500 mg total) by mouth daily for 7 days., Disp: 7 tablet, Rfl: 0 .  methylPREDNISolone (MEDROL DOSEPAK) 4 MG TBPK tablet, Use as directed, Disp: 21 tablet, Rfl: 0 .  ADVAIR DISKUS 250-50 MCG/DOSE AEPB, SMARTSIG:2 Puff(s) By Mouth Twice Daily, Disp: , Rfl:  .  albuterol (VENTOLIN HFA) 108 (90 Base) MCG/ACT inhaler, Inhale 2 puffs into the lungs every 4 (four) hours as needed for wheezing or shortness of breath., Disp: 8 g, Rfl: 4 .  ALPRAZolam (XANAX) 0.25 MG tablet, Take 0.25 mg by mouth at bedtime., Disp: , Rfl: 0 .   Ascorbic Acid (VITAMIN C) 1000 MG tablet, Take 1,000 mg by mouth daily., Disp: , Rfl:  .  cyclobenzaprine (FLEXERIL) 5 MG tablet, Take 5 mg by mouth 2 (two) times daily at 10 AM and 5 PM. , Disp: , Rfl:  .  divalproex (DEPAKOTE) 250 MG DR tablet, Take 1 tablet (250 mg total) by mouth 3 (three) times daily., Disp: 270 tablet, Rfl: 1 .  fluticasone (FLONASE) 50 MCG/ACT nasal spray, Place 1 spray into both nostrils daily., Disp: 11.1 mL, Rfl: 2 .  hydrOXYzine (ATARAX/VISTARIL) 10 MG tablet, TAKE 1 TABLET (10 MG TOTAL) BY MOUTH 2 (TWO) TIMES DAILY AS NEEDED. FOR SEVERE ANXIETY ATTACKS ONLY, Disp: 180 tablet, Rfl: 1 .  KLOR-CON M20 20 MEQ tablet, Take 20 mEq by mouth 2 (two) times daily. , Disp: , Rfl: 4 .  megestrol (MEGACE) 400 MG/10ML suspension, Take 10 mLs (400 mg total) by mouth daily., Disp: 240 mL, Rfl: 0 .  Multiple Vitamin (MULTIVITAMIN) tablet, Take 1 tablet by mouth daily., Disp: , Rfl:  .  ondansetron (ZOFRAN-ODT) 4 MG disintegrating tablet, , Disp: , Rfl:  .  pantoprazole (PROTONIX) 40 MG tablet, , Disp: , Rfl:  .  Potassium Chloride ER 20 MEQ TBCR, , Disp: , Rfl:  .  propranolol (INDERAL) 20 MG tablet, Take 1 tablet (20 mg total) by mouth 3 (three) times daily., Disp: 270 tablet, Rfl: 1 .  QUEtiapine (SEROQUEL) 50 MG tablet, Take 1 tablet (50 mg total) by mouth at bedtime as needed., Disp: 90 tablet, Rfl: 0   Allergies  Allergen Reactions  . Sulfacetamide Sodium Swelling    Diarrhea,dizziness, tongue swelling  . Other Other (See Comments) and Swelling    PT states that anything with the sides effects "may cause rash" she cannot take due to her psoriasis, it will make the patches on her hand so thick she can't use her hands.  Diarrhea,dizziness, tongue swelling  . Sulfa Antibiotics Swelling    Diarrhea,dizziness, tongue swelling  . Abilify [Aripiprazole] Other (See Comments)  . Cephalosporins Rash  . Erythromycin Other (See Comments)    Abd. pain Abd. pain Other reaction(s):  Other (See Comments) Abd. pain  . Lamotrigine Rash and Other (See Comments)    psorasis worsen psorasis worsen  . Risperidone Other (See Comments)    tremors tremors tremors    ROS Review of Systems  Constitutional: Negative.   HENT: Positive for congestion, rhinorrhea, sinus pain and sneezing. Negative for ear  discharge, mouth sores and postnasal drip.   Eyes: Negative.   Respiratory: Positive for cough, shortness of breath and wheezing.   Cardiovascular: Negative.   Gastrointestinal: Negative.   Endocrine: Negative.   Genitourinary: Negative.   Musculoskeletal: Negative.   Skin: Negative.   Allergic/Immunologic: Negative.   Neurological: Negative.   Hematological: Negative.   Psychiatric/Behavioral: Negative.   All other systems reviewed and are negative.     Objective:    Physical Exam Vitals reviewed.  Constitutional:      Appearance: Normal appearance.  HENT:     Head:     Comments: Chest is congested bilaterally    Mouth/Throat:     Mouth: Mucous membranes are moist.  Eyes:     Pupils: Pupils are equal, round, and reactive to light.  Neck:     Vascular: No carotid bruit.  Cardiovascular:     Rate and Rhythm: Normal rate and regular rhythm.     Pulses: Normal pulses.     Heart sounds: Normal heart sounds.  Pulmonary:     Effort: Pulmonary effort is normal.     Breath sounds: Normal breath sounds.  Chest:     Comments: Bilateral wheezing was noted. Abdominal:     General: Bowel sounds are normal.     Palpations: Abdomen is soft. There is no hepatomegaly, splenomegaly or mass.     Tenderness: There is no abdominal tenderness.     Hernia: No hernia is present.  Musculoskeletal:        General: No tenderness.     Cervical back: Neck supple.     Right lower leg: No edema.     Left lower leg: No edema.  Skin:    Findings: No rash.  Neurological:     Mental Status: She is alert and oriented to person, place, and time.     Motor: No weakness.   Psychiatric:        Mood and Affect: Mood and affect normal.        Behavior: Behavior normal.     BP 119/61   Pulse 65   Ht 5' (1.524 m)   Wt 124 lb 11.2 oz (56.6 kg)   LMP 02/09/2008   BMI 24.35 kg/m  Wt Readings from Last 3 Encounters:  05/29/20 124 lb 11.2 oz (56.6 kg)  05/19/20 122 lb (55.3 kg)  05/13/20 122 lb 11.2 oz (55.7 kg)     Health Maintenance Due  Topic Date Due  . Hepatitis C Screening  Never done  . COVID-19 Vaccine (1) Never done  . HIV Screening  Never done  . PAP SMEAR-Modifier  11/01/2016    There are no preventive care reminders to display for this patient.  Lab Results  Component Value Date   TSH 1.27 12/12/2019   Lab Results  Component Value Date   WBC 6.0 02/22/2020   HGB 11.6 02/22/2020   HCT 34.7 02/22/2020   MCV 98 (H) 02/22/2020   PLT 380 02/22/2020   Lab Results  Component Value Date   NA 138 02/22/2020   K 5.0 02/22/2020   CO2 21 02/22/2020   GLUCOSE 86 02/22/2020   BUN 10 02/22/2020   CREATININE 1.17 (H) 02/22/2020   BILITOT <0.2 02/22/2020   ALKPHOS 72 02/22/2020   AST 15 02/22/2020   ALT 13 02/22/2020   PROT 6.6 02/22/2020   ALBUMIN 4.2 02/22/2020   CALCIUM 9.1 02/22/2020   ANIONGAP 10 08/24/2018   Lab Results  Component Value Date  CHOL 197 12/12/2019   Lab Results  Component Value Date   HDL 51 12/12/2019   Lab Results  Component Value Date   LDLCALC 123 (H) 12/12/2019   Lab Results  Component Value Date   TRIG 121 12/12/2019   Lab Results  Component Value Date   CHOLHDL 3.9 12/12/2019   Lab Results  Component Value Date   HGBA1C 5.3 08/24/2018      Assessment & Plan:   Problem List Items Addressed This Visit      Respiratory   Allergic rhinitis due to allergen    Patient was advised to take Claritin 10 mg daily      Acute recurrent maxillary sinusitis    Patient was given Kenalog 40 mg intramuscular and also started on Levaquin.      Relevant Medications   levofloxacin (LEVAQUIN)  500 MG tablet   methylPREDNISolone (MEDROL DOSEPAK) 4 MG TBPK tablet     Other   Tobacco use disorder - Primary    - I instructed the patient to stop smoking and provided them with smoking cessation materials.  - I informed the patient that smoking puts them at increased risk for cancer, COPD, hypertension, and more.  - Informed the patient to seek help if they begin to have trouble breathing, develop chest pain, start to cough up blood, feel faint, or pass out.      Bipolar 1 disorder, mixed, moderate (HCC)    Stable at the present time       Other Visit Diagnoses    Fatigue, unspecified type       Relevant Medications   cyanocobalamin ((VITAMIN B-12)) injection 1,000 mcg (Completed) (Start on 05/29/2020 11:00 AM)      Meds ordered this encounter  Medications  . levofloxacin (LEVAQUIN) 500 MG tablet    Sig: Take 1 tablet (500 mg total) by mouth daily for 7 days.    Dispense:  7 tablet    Refill:  0  . methylPREDNISolone (MEDROL DOSEPAK) 4 MG TBPK tablet    Sig: Use as directed    Dispense:  21 tablet    Refill:  0  . triamcinolone acetonide (KENALOG-40) injection 40 mg  . cyanocobalamin ((VITAMIN B-12)) injection 1,000 mcg    Follow-up: No follow-ups on file.    Cletis Athens, MD

## 2020-05-29 NOTE — Assessment & Plan Note (Signed)
-   I instructed the patient to stop smoking and provided them with smoking cessation materials.  - I informed the patient that smoking puts them at increased risk for cancer, COPD, hypertension, and more.  - Informed the patient to seek help if they begin to have trouble breathing, develop chest pain, start to cough up blood, feel faint, or pass out.  

## 2020-05-29 NOTE — Assessment & Plan Note (Signed)
Patient was given Kenalog 40 mg intramuscular and also started on Levaquin.

## 2020-06-02 ENCOUNTER — Ambulatory Visit: Payer: BC Managed Care – PPO | Admitting: Internal Medicine

## 2020-06-05 ENCOUNTER — Ambulatory Visit: Payer: BC Managed Care – PPO | Admitting: Internal Medicine

## 2020-06-05 ENCOUNTER — Ambulatory Visit: Payer: BC Managed Care – PPO | Admitting: Family Medicine

## 2020-06-05 ENCOUNTER — Other Ambulatory Visit: Payer: Self-pay

## 2020-06-05 ENCOUNTER — Encounter: Payer: Self-pay | Admitting: Internal Medicine

## 2020-06-05 VITALS — BP 109/57 | HR 62 | Ht 60.0 in | Wt 122.7 lb

## 2020-06-05 DIAGNOSIS — J431 Panlobular emphysema: Secondary | ICD-10-CM | POA: Diagnosis not present

## 2020-06-05 DIAGNOSIS — F3110 Bipolar disorder, current episode manic without psychotic features, unspecified: Secondary | ICD-10-CM | POA: Diagnosis not present

## 2020-06-05 DIAGNOSIS — I700 Atherosclerosis of aorta: Secondary | ICD-10-CM

## 2020-06-05 DIAGNOSIS — J3089 Other allergic rhinitis: Secondary | ICD-10-CM | POA: Diagnosis not present

## 2020-06-05 DIAGNOSIS — F17200 Nicotine dependence, unspecified, uncomplicated: Secondary | ICD-10-CM

## 2020-06-05 NOTE — Assessment & Plan Note (Signed)
-   I instructed the patient to stop smoking and provided them with smoking cessation materials.  - I informed the patient that smoking puts them at increased risk for cancer, COPD, hypertension, and more.  - Informed the patient to seek help if they begin to have trouble breathing, develop chest pain, start to cough up blood, feel faint, or pass out.  

## 2020-06-05 NOTE — Progress Notes (Signed)
Established Patient Office Visit  Subjective:  Patient ID: Sheryl Perez, female    DOB: 08-12-1960  Age: 60 y.o. MRN: [id number]  CC:  Chief Complaint  Patient presents with  . 7 day follow up    HPI  Sheryl Perez presents for a general checkup.  Patient is known to have bipolar disorder chronic back pain COPD anxiety depression  Past Medical History:  Diagnosis Date  . Back injury   . 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 Bipolar disorder ([place])   . Chest pain, unspecified   . Chronic low back pain 10/23/2013  . Chronic UTI   . COPD (chronic obstructive pulmonary disease) ([place])   . Depression   . Dysfunction of eustachian tube   . Enlargement of lymph nodes   . History of Bell's palsy Right  . Injury, other and unspecified, knee, leg, ankle, and foot   . MRSA (methicillin resistant staph aureus) culture positive 07-11-14   abscess  . Other specified disease of hair and hair follicles   . Rectal prolapse   . Unspecified symptom associated with female genital organs     Past Surgical History:  Procedure Laterality Date  . ABDOMINAL HYSTERECTOMY    . bladder tack  2010  . CHOLECYSTECTOMY  2002  . FOOT SURGERY Bilateral 2004   bunion  . GANGLION CYST EXCISION  2002  . INCISION AND DRAINAGE ABSCESS  04-16-15  . PARTIAL HYSTERECTOMY  2010   uterus removed    Family History  Problem Relation Age of Onset  . Parkinson's disease Maternal Grandfather   . Diabetes Father   . Cancer Father        brain tumor  . Depression Father   . Diabetes Paternal Grandfather   . Colon polyps Mother   . Heart disease Maternal Grandmother   . Heart attack Maternal Grandmother   . Stroke Maternal Grandmother   . Depression Sister   . Alcohol abuse Brother   . Depression Brother   . Breast cancer Paternal Aunt   . Breast cancer Cousin     Social History   Socioeconomic History  . Marital status: Married    Spouse name: Not on file  . Number of  children: 3  . Years of education: hs  . Highest education level: Some college, no degree  Occupational History  . Not on file  Tobacco Use  . Smoking status: Current Every Day Smoker    Packs/day: 0.50    Years: 40.00    Pack years: 20.00    Types: Cigarettes  . Smokeless tobacco: Never Used  . Tobacco comment: reported on 11/07/2019  Vaping Use  . Vaping Use: Never used  Substance and Sexual Activity  . Alcohol use: No    Alcohol/week: 1.0 standard drink    Types: 1 Glasses of wine per week    Comment: occasional  . Drug use: Yes    Types: Marijuana    Comment: last used 05-20-15  . Sexual activity: Yes    Birth control/protection: None  Other Topics Concern  . Not on file  Social History Narrative   Patient lives at home with her husband [name]).   Patient works full time.   Education CNA    Right handed.   Caffeine Tea , Soda.   Social Determinants of Health   Financial Resource Strain: Not on file  Food Insecurity: Not on file  Transportation Needs: Not on file  Physical  Activity: Not on file  Stress: Not on file  Social Connections: Not on file  Intimate Partner Violence: Not on file     Current Outpatient Medications:  .  ADVAIR DISKUS 250-50 MCG/DOSE AEPB, SMARTSIG:2 Puff(s) By Mouth Twice Daily, Disp: , Rfl:  .  albuterol (VENTOLIN HFA) 108 (90 Base) MCG/ACT inhaler, Inhale 2 puffs into the lungs every 4 (four) hours as needed for wheezing or shortness of breath., Disp: 8 g, Rfl: 4 .  ALPRAZolam (XANAX) 0.25 MG tablet, Take 0.25 mg by mouth at bedtime., Disp: , Rfl: 0 .  Ascorbic Acid (VITAMIN C) 1000 MG tablet, Take 1,000 mg by mouth daily., Disp: , Rfl:  .  cyclobenzaprine (FLEXERIL) 5 MG tablet, Take 5 mg by mouth 2 (two) times daily at 10 AM and 5 PM. , Disp: , Rfl:  .  divalproex (DEPAKOTE) 250 MG DR tablet, Take 1 tablet (250 mg total) by mouth 3 (three) times daily., Disp: 270 tablet, Rfl: 1 .  fluticasone (FLONASE) 50 MCG/ACT nasal spray, Place 1  spray into both nostrils daily., Disp: 11.1 mL, Rfl: 2 .  hydrOXYzine (ATARAX/VISTARIL) 10 MG tablet, TAKE 1 TABLET (10 MG TOTAL) BY MOUTH 2 (TWO) TIMES DAILY AS NEEDED. FOR SEVERE ANXIETY ATTACKS ONLY, Disp: 180 tablet, Rfl: 1 .  KLOR-CON M20 20 MEQ tablet, Take 20 mEq by mouth 2 (two) times daily. , Disp: , Rfl: 4 .  levofloxacin (LEVAQUIN) 500 MG tablet, Take 1 tablet (500 mg total) by mouth daily for 7 days., Disp: 7 tablet, Rfl: 0 .  megestrol (MEGACE) 400 MG/10ML suspension, Take 10 mLs (400 mg total) by mouth daily., Disp: 240 mL, Rfl: 0 .  methylPREDNISolone (MEDROL DOSEPAK) 4 MG TBPK tablet, Use as directed, Disp: 21 tablet, Rfl: 0 .  Multiple Vitamin (MULTIVITAMIN) tablet, Take 1 tablet by mouth daily., Disp: , Rfl:  .  ondansetron (ZOFRAN-ODT) 4 MG disintegrating tablet, , Disp: , Rfl:  .  pantoprazole (PROTONIX) 40 MG tablet, , Disp: , Rfl:  .  Potassium Chloride ER 20 MEQ TBCR, , Disp: , Rfl:  .  propranolol (INDERAL) 20 MG tablet, Take 1 tablet (20 mg total) by mouth 3 (three) times daily., Disp: 270 tablet, Rfl: 1 .  QUEtiapine (SEROQUEL) 50 MG tablet, Take 1 tablet (50 mg total) by mouth at bedtime as needed., Disp: 90 tablet, Rfl: 0   Allergies  Allergen Reactions  . Sulfacetamide Sodium Swelling    Diarrhea,dizziness, tongue swelling  . Other Other (See Comments) and Swelling    PT states that anything with the sides effects "may cause rash" she cannot take due to her psoriasis, it will make the patches on her hand so thick she can't use her hands.  Diarrhea,dizziness, tongue swelling  . Sulfa Antibiotics Swelling    Diarrhea,dizziness, tongue swelling  . Abilify [Aripiprazole] Other (See Comments)  . Cephalosporins Rash  . Erythromycin Other (See Comments)    Abd. pain Abd. pain Other reaction(s): Other (See Comments) Abd. pain  . Lamotrigine Rash and Other (See Comments)    psorasis worsen psorasis worsen  . Risperidone Other (See Comments)     tremors tremors tremors    ROS Review of Systems  Constitutional: Negative.   HENT: Negative.   Eyes: Negative.   Respiratory: Negative.   Cardiovascular: Negative.   Gastrointestinal: Negative.   Endocrine: Negative.   Genitourinary: Negative.   Musculoskeletal: Negative.   Skin: Negative.   Allergic/Immunologic: Negative.   Neurological: Negative.   Hematological: Negative.  Psychiatric/Behavioral: Negative.   All other systems reviewed and are negative.     Objective:    Physical Exam Vitals reviewed.  Constitutional:      Appearance: Normal appearance.  HENT:     Mouth/Throat:     Mouth: Mucous membranes are moist.  Eyes:     Pupils: Pupils are equal, round, and reactive to light.  Neck:     Vascular: No carotid bruit.  Cardiovascular:     Rate and Rhythm: Normal rate and regular rhythm.     Pulses: Normal pulses.     Heart sounds: Normal heart sounds.  Pulmonary:     Effort: Pulmonary effort is normal.     Breath sounds: Normal breath sounds.  Abdominal:     General: Bowel sounds are normal.     Palpations: Abdomen is soft. There is no hepatomegaly, splenomegaly or mass.     Tenderness: There is no abdominal tenderness.     Hernia: No hernia is present.  Musculoskeletal:        General: No tenderness.     Cervical back: Neck supple.     Right lower leg: No edema.     Left lower leg: No edema.  Skin:    Findings: No rash.  Neurological:     Mental Status: She is alert and oriented to person, place, and time.     Motor: No weakness.  Psychiatric:        Mood and Affect: Mood and affect normal.        Behavior: Behavior normal.     BP (!) 109/57   Pulse 62   Ht 5' (1.524 m)   Wt 122 lb 11.2 oz (55.7 kg)   LMP 02/09/2008   BMI 23.96 kg/m  Wt Readings from Last 3 Encounters:  06/05/20 122 lb 11.2 oz (55.7 kg)  05/29/20 124 lb 11.2 oz (56.6 kg)  05/19/20 122 lb (55.3 kg)     Health Maintenance Due  Topic Date Due  . Hepatitis C  Screening  Never done  . COVID-19 Vaccine (1) Never done  . HIV Screening  Never done  . PAP SMEAR-Modifier  11/01/2016    There are no preventive care reminders to display for this patient.  Lab Results  Component Value Date   TSH 1.27 12/12/2019   Lab Results  Component Value Date   WBC 6.0 02/22/2020   HGB 11.6 02/22/2020   HCT 34.7 02/22/2020   MCV 98 (H) 02/22/2020   PLT 380 02/22/2020   Lab Results  Component Value Date   NA 138 02/22/2020   K 5.0 02/22/2020   CO2 21 02/22/2020   GLUCOSE 86 02/22/2020   BUN 10 02/22/2020   CREATININE 1.17 (H) 02/22/2020   BILITOT <0.2 02/22/2020   ALKPHOS 72 02/22/2020   AST 15 02/22/2020   ALT 13 02/22/2020   PROT 6.6 02/22/2020   ALBUMIN 4.2 02/22/2020   CALCIUM 9.1 02/22/2020   ANIONGAP 10 08/24/2018   Lab Results  Component Value Date   CHOL 197 12/12/2019   Lab Results  Component Value Date   HDL 51 12/12/2019   Lab Results  Component Value Date   LDLCALC 123 (H) 12/12/2019   Lab Results  Component Value Date   TRIG 121 12/12/2019   Lab Results  Component Value Date   CHOLHDL 3.9 12/12/2019   Lab Results  Component Value Date   HGBA1C 5.3 08/24/2018      Assessment & Plan:   Problem List  Items Addressed This Visit      Cardiovascular and Mediastinum   Atherosclerosis of aorta (HCC)    Hypercholesterolemia  I advised the patient to follow Mediterranean diet This diet is rich in fruits vegetables and whole grain, and This diet is also rich in fish and lean meat Patient should also eat a handful of almonds or walnuts daily Recent heart study indicated that average follow-up on this kind of diet reduces the cardiovascular mortality by 50 to 70%==        Respiratory   Allergic rhinitis due to allergen    Take claritin 10 mg po daily      Panlobular emphysema ([place])    - I instructed the patient to stop smoking and provided them with smoking cessation materials.  - I informed the patient that  smoking puts them at increased risk for cancer, COPD, hypertension, and more.  - Informed the patient to seek help if they begin to have trouble breathing, develop chest pain, start to cough up blood, feel faint, or pass out.        Other   Tobacco use disorder    Stop smoking      Bipolar I disorder, most recent episode (or current) manic ([place]) - Primary    stable         No orders of the defined types were placed in this encounter.   Follow-up: No follow-ups on file.    Cletis Athens, MD

## 2020-06-05 NOTE — Assessment & Plan Note (Signed)
stable °

## 2020-06-05 NOTE — Assessment & Plan Note (Signed)
Hypercholesterolemia  I advised the patient to follow Mediterranean diet This diet is rich in fruits vegetables and whole grain, and This diet is also rich in fish and lean meat Patient should also eat a handful of almonds or walnuts daily Recent heart study indicated that average follow-up on this kind of diet reduces the cardiovascular mortality by 50 to 70%== 

## 2020-06-05 NOTE — Assessment & Plan Note (Signed)
Stop smoking

## 2020-06-05 NOTE — Assessment & Plan Note (Signed)
Take claritin 10 mg po daily

## 2020-06-10 DIAGNOSIS — J310 Chronic rhinitis: Secondary | ICD-10-CM | POA: Diagnosis not present

## 2020-06-10 DIAGNOSIS — F17200 Nicotine dependence, unspecified, uncomplicated: Secondary | ICD-10-CM | POA: Diagnosis not present

## 2020-06-10 DIAGNOSIS — J301 Allergic rhinitis due to pollen: Secondary | ICD-10-CM | POA: Diagnosis not present

## 2020-06-13 DIAGNOSIS — J301 Allergic rhinitis due to pollen: Secondary | ICD-10-CM | POA: Diagnosis not present

## 2020-06-30 ENCOUNTER — Other Ambulatory Visit: Payer: Self-pay

## 2020-06-30 ENCOUNTER — Encounter: Payer: Self-pay | Admitting: Internal Medicine

## 2020-06-30 ENCOUNTER — Ambulatory Visit (INDEPENDENT_AMBULATORY_CARE_PROVIDER_SITE_OTHER): Payer: BC Managed Care – PPO | Admitting: Internal Medicine

## 2020-06-30 VITALS — BP 125/64 | HR 78 | Ht 60.0 in | Wt 121.8 lb

## 2020-06-30 DIAGNOSIS — J3089 Other allergic rhinitis: Secondary | ICD-10-CM | POA: Diagnosis not present

## 2020-06-30 DIAGNOSIS — F3113 Bipolar disorder, current episode manic without psychotic features, severe: Secondary | ICD-10-CM

## 2020-06-30 DIAGNOSIS — F17200 Nicotine dependence, unspecified, uncomplicated: Secondary | ICD-10-CM

## 2020-06-30 DIAGNOSIS — T63301A Toxic effect of unspecified spider venom, accidental (unintentional), initial encounter: Secondary | ICD-10-CM

## 2020-06-30 DIAGNOSIS — J431 Panlobular emphysema: Secondary | ICD-10-CM | POA: Diagnosis not present

## 2020-06-30 DIAGNOSIS — M545 Low back pain, unspecified: Secondary | ICD-10-CM | POA: Diagnosis not present

## 2020-06-30 DIAGNOSIS — G8929 Other chronic pain: Secondary | ICD-10-CM

## 2020-06-30 MED ORDER — TRIAMCINOLONE ACETONIDE 0.1 % EX CREA: 1.0000 "application " | TOPICAL_CREAM | Freq: Two times a day (BID) | CUTANEOUS | 0 refills | Status: DC

## 2020-06-30 MED ORDER — LORATADINE 10 MG PO TABS: 10.0000 mg | ORAL_TABLET | Freq: Every day | ORAL | 1 refills | Status: DC

## 2020-06-30 NOTE — Assessment & Plan Note (Signed)
Stable at the present time. 

## 2020-06-30 NOTE — Assessment & Plan Note (Signed)
-   Patient's back pain is under control with medication.  - Encouraged the patient to stretch or do yoga as able to help with back pain 

## 2020-06-30 NOTE — Assessment & Plan Note (Signed)
Patient has a sprain vit B1 left ankle there is erythema around the bite.  Will use cortisone cream and Claritin.  Return back in 2 days for follow-up

## 2020-06-30 NOTE — Progress Notes (Signed)
Established Patient Office Visit  Subjective:  Patient ID: Sheryl Perez, female    DOB: 06/30/1960  Age: 60 y.o. MRN: [id number]  CC:  Chief Complaint  Patient presents with  . Insect Bite    Patient has complaints of possible spider bite. Left foot red with white in the center.    HPI  Sheryl Perez presents for left foot spider bite Past Medical History:  Diagnosis Date  . Back injury   . 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 Bipolar disorder ([place])   . Chest pain, unspecified   . Chronic low back pain 10/23/2013  . Chronic UTI   . COPD (chronic obstructive pulmonary disease) ([place])   . Depression   . Dysfunction of eustachian tube   . Enlargement of lymph nodes   . History of Bell's palsy Right  . Injury, other and unspecified, knee, leg, ankle, and foot   . MRSA (methicillin resistant staph aureus) culture positive 07-11-14   abscess  . Other specified disease of hair and hair follicles   . Rectal prolapse   . Unspecified symptom associated with female genital organs     Past Surgical History:  Procedure Laterality Date  . ABDOMINAL HYSTERECTOMY    . bladder tack  2010  . CHOLECYSTECTOMY  2002  . FOOT SURGERY Bilateral 2004   bunion  . GANGLION CYST EXCISION  2002  . INCISION AND DRAINAGE ABSCESS  04-16-15  . PARTIAL HYSTERECTOMY  2010   uterus removed    Family History  Problem Relation Age of Onset  . Parkinson's disease Maternal Grandfather   . Diabetes Father   . Cancer Father        brain tumor  . Depression Father   . Diabetes Paternal Grandfather   . Colon polyps Mother   . Heart disease Maternal Grandmother   . Heart attack Maternal Grandmother   . Stroke Maternal Grandmother   . Depression Sister   . Alcohol abuse Brother   . Depression Brother   . Breast cancer Paternal Aunt   . Breast cancer Cousin     Social History   Socioeconomic History  . Marital status: Married    Spouse name: Not on file  . Number of  children: 3  . Years of education: hs  . Highest education level: Some college, no degree  Occupational History  . Not on file  Tobacco Use  . Smoking status: Current Every Day Smoker    Packs/day: 0.50    Years: 40.00    Pack years: 20.00    Types: Cigarettes  . Smokeless tobacco: Never Used  . Tobacco comment: reported on 11/07/2019  Vaping Use  . Vaping Use: Never used  Substance and Sexual Activity  . Alcohol use: No    Alcohol/week: 1.0 standard drink    Types: 1 Glasses of wine per week    Comment: occasional  . Drug use: Yes    Types: Marijuana    Comment: last used 05-20-15  . Sexual activity: Yes    Birth control/protection: None  Other Topics Concern  . Not on file  Social History Narrative   Patient lives at home with her husband Sheryl Perez).   Patient works full time.   Education CNA    Right handed.   Caffeine Tea , Soda.   Social Determinants of Health   Financial Resource Strain: Not on file  Food Insecurity: Not on file  Transportation Needs: Not on file  Physical Activity: Not on file  Stress: Not on file  Social Connections: Not on file  Intimate Partner Violence: Not on file     Current Outpatient Medications:  .  loratadine (CLARITIN) 10 MG tablet, Take 1 tablet (10 mg total) by mouth daily., Disp: 30 tablet, Rfl: 1 .  triamcinolone cream (KENALOG) 0.1 %, Apply 1 application topically 2 (two) times daily., Disp: 30 g, Rfl: 0 .  ADVAIR DISKUS 250-50 MCG/DOSE AEPB, SMARTSIG:2 Puff(s) By Mouth Twice Daily, Disp: , Rfl:  .  albuterol (VENTOLIN HFA) 108 (90 Base) MCG/ACT inhaler, Inhale 2 puffs into the lungs every 4 (four) hours as needed for wheezing or shortness of breath., Disp: 8 g, Rfl: 4 .  ALPRAZolam (XANAX) 0.25 MG tablet, Take 0.25 mg by mouth at bedtime., Disp: , Rfl: 0 .  Ascorbic Acid (VITAMIN C) 1000 MG tablet, Take 1,000 mg by mouth daily., Disp: , Rfl:  .  cyclobenzaprine (FLEXERIL) 5 MG tablet, Take 5 mg by mouth 2 (two) times daily at  10 AM and 5 PM. , Disp: , Rfl:  .  divalproex (DEPAKOTE) 250 MG DR tablet, Take 1 tablet (250 mg total) by mouth 3 (three) times daily., Disp: 270 tablet, Rfl: 1 .  fluticasone (FLONASE) 50 MCG/ACT nasal spray, Place 1 spray into both nostrils daily., Disp: 11.1 mL, Rfl: 2 .  hydrOXYzine (ATARAX/VISTARIL) 10 MG tablet, TAKE 1 TABLET (10 MG TOTAL) BY MOUTH 2 (TWO) TIMES DAILY AS NEEDED. FOR SEVERE ANXIETY ATTACKS ONLY, Disp: 180 tablet, Rfl: 1 .  KLOR-CON M20 20 MEQ tablet, Take 20 mEq by mouth 2 (two) times daily. , Disp: , Rfl: 4 .  megestrol (MEGACE) 400 MG/10ML suspension, Take 10 mLs (400 mg total) by mouth daily., Disp: 240 mL, Rfl: 0 .  methylPREDNISolone (MEDROL DOSEPAK) 4 MG TBPK tablet, Use as directed, Disp: 21 tablet, Rfl: 0 .  Multiple Vitamin (MULTIVITAMIN) tablet, Take 1 tablet by mouth daily., Disp: , Rfl:  .  ondansetron (ZOFRAN-ODT) 4 MG disintegrating tablet, , Disp: , Rfl:  .  pantoprazole (PROTONIX) 40 MG tablet, , Disp: , Rfl:  .  Potassium Chloride ER 20 MEQ TBCR, , Disp: , Rfl:  .  propranolol (INDERAL) 20 MG tablet, Take 1 tablet (20 mg total) by mouth 3 (three) times daily., Disp: 270 tablet, Rfl: 1 .  QUEtiapine (SEROQUEL) 50 MG tablet, Take 1 tablet (50 mg total) by mouth at bedtime as needed., Disp: 90 tablet, Rfl: 0   Allergies  Allergen Reactions  . Sulfacetamide Sodium Swelling    Diarrhea,dizziness, tongue swelling  . Other Other (See Comments) and Swelling    PT states that anything with the sides effects "may cause rash" she cannot take due to her psoriasis, it will make the patches on her hand so thick she can't use her hands.  Diarrhea,dizziness, tongue swelling  . Sulfa Antibiotics Swelling    Diarrhea,dizziness, tongue swelling  . Abilify [Aripiprazole] Other (See Comments)  . Cephalosporins Rash  . Erythromycin Other (See Comments)    Abd. pain Abd. pain Other reaction(s): Other (See Comments) Abd. pain  . Lamotrigine Rash and Other (See Comments)     psorasis worsen psorasis worsen  . Risperidone Other (See Comments)    tremors tremors tremors    ROS Review of Systems  Constitutional: Negative.   HENT: Negative.   Eyes: Negative.   Respiratory: Negative.   Cardiovascular: Negative.   Gastrointestinal: Negative.   Endocrine: Negative.   Genitourinary: Negative.   Musculoskeletal:  Negative.   Skin: Negative.   Allergic/Immunologic: Negative.   Neurological: Negative.   Hematological: Negative.   Psychiatric/Behavioral: Negative.   All other systems reviewed and are negative.     Objective:    Physical Exam Vitals reviewed.  Constitutional:      Appearance: Normal appearance.  HENT:     Mouth/Throat:     Mouth: Mucous membranes are moist.  Eyes:     Pupils: Pupils are equal, round, and reactive to light.  Neck:     Vascular: No carotid bruit.  Cardiovascular:     Rate and Rhythm: Normal rate and regular rhythm.     Pulses: Normal pulses.     Heart sounds: Normal heart sounds.  Pulmonary:     Effort: Pulmonary effort is normal.     Breath sounds: Normal breath sounds.  Abdominal:     General: Bowel sounds are normal.     Palpations: Abdomen is soft. There is no hepatomegaly, splenomegaly or mass.     Tenderness: There is no abdominal tenderness.     Hernia: No hernia is present.  Musculoskeletal:        General: No tenderness.     Cervical back: Neck supple.     Right lower leg: No edema.     Left lower leg: No edema.  Feet:     Comments: Spider bite left foot    Skin:    Findings: No rash.  Neurological:     Mental Status: She is alert and oriented to person, place, and time.     Motor: No weakness.  Psychiatric:        Mood and Affect: Mood and affect normal.        Behavior: Behavior normal.     BP 125/64   Pulse 78   Ht 5' (1.524 m)   Wt 121 lb 12.8 oz (55.2 kg)   LMP 02/09/2008   BMI 23.79 kg/m  Wt Readings from Last 3 Encounters:  06/30/20 121 lb 12.8 oz (55.2 kg)  06/05/20  122 lb 11.2 oz (55.7 kg)  05/29/20 124 lb 11.2 oz (56.6 kg)     Health Maintenance Due  Topic Date Due  . COVID-19 Vaccine (1) Never done  . HIV Screening  Never done  . Hepatitis C Screening  Never done  . PAP SMEAR-Modifier  11/01/2016    There are no preventive care reminders to display for this patient.  Lab Results  Component Value Date   TSH 1.27 12/12/2019   Lab Results  Component Value Date   WBC 6.0 02/22/2020   HGB 11.6 02/22/2020   HCT 34.7 02/22/2020   MCV 98 (H) 02/22/2020   PLT 380 02/22/2020   Lab Results  Component Value Date   NA 138 02/22/2020   K 5.0 02/22/2020   CO2 21 02/22/2020   GLUCOSE 86 02/22/2020   BUN 10 02/22/2020   CREATININE 1.17 (H) 02/22/2020   BILITOT <0.2 02/22/2020   ALKPHOS 72 02/22/2020   AST 15 02/22/2020   ALT 13 02/22/2020   PROT 6.6 02/22/2020   ALBUMIN 4.2 02/22/2020   CALCIUM 9.1 02/22/2020   ANIONGAP 10 08/24/2018   Lab Results  Component Value Date   CHOL 197 12/12/2019   Lab Results  Component Value Date   HDL 51 12/12/2019   Lab Results  Component Value Date   LDLCALC 123 (H) 12/12/2019   Lab Results  Component Value Date   TRIG 121 12/12/2019   Lab Results  Component Value Date   CHOLHDL 3.9 12/12/2019   Lab Results  Component Value Date   HGBA1C 5.3 08/24/2018      Assessment & Plan:   Problem List Items Addressed This Visit      Respiratory   Allergic rhinitis due to allergen - Primary    Take Claritin 10 mg daily      Panlobular emphysema ([facility])    Chest is clear clear watery wheezing      Relevant Medications   loratadine (CLARITIN) 10 MG tablet     Other   Chronic low back pain (Chronic)    - Patient's back pain is under control with medication.  - Encouraged the patient to stretch or do yoga as able to help with back pain      Tobacco use disorder    - I instructed the patient to stop smoking and provided them with smoking cessation materials.  - I informed the patient  that smoking puts them at increased risk for cancer, COPD, hypertension, and more.  - Informed the patient to seek help if they begin to have trouble breathing, develop chest pain, start to cough up blood, feel faint, or pass out.      Bipolar disorder, manic ([place])    Stable at the present time.      Spider bite    Patient has a sprain vit B1 left ankle there is erythema around the bite.  Will use cortisone cream and Claritin.  Return back in 2 days for follow-up         Meds ordered this encounter  Medications  . triamcinolone cream (KENALOG) 0.1 %    Sig: Apply 1 application topically 2 (two) times daily.    Dispense:  30 g    Refill:  0  . loratadine (CLARITIN) 10 MG tablet    Sig: Take 1 tablet (10 mg total) by mouth daily.    Dispense:  30 tablet    Refill:  1    Follow-up: No follow-ups on file.    Cletis Athens, MD

## 2020-06-30 NOTE — Assessment & Plan Note (Signed)
Take Claritin 10 mg daily

## 2020-06-30 NOTE — Assessment & Plan Note (Signed)
-   I instructed the patient to stop smoking and provided them with smoking cessation materials.  - I informed the patient that smoking puts them at increased risk for cancer, COPD, hypertension, and more.  - Informed the patient to seek help if they begin to have trouble breathing, develop chest pain, start to cough up blood, feel faint, or pass out.  

## 2020-06-30 NOTE — Assessment & Plan Note (Signed)
Chest is clear clear watery wheezing

## 2020-07-02 ENCOUNTER — Other Ambulatory Visit: Payer: Self-pay | Admitting: *Deleted

## 2020-07-02 MED ORDER — LEVOFLOXACIN 500 MG PO TABS: 500.0000 mg | ORAL_TABLET | Freq: Every day | ORAL | 0 refills | Status: AC

## 2020-07-22 ENCOUNTER — Other Ambulatory Visit: Payer: Self-pay | Admitting: Internal Medicine

## 2020-08-04 ENCOUNTER — Other Ambulatory Visit: Payer: Self-pay

## 2020-08-04 ENCOUNTER — Emergency Department: Payer: BC Managed Care – PPO

## 2020-08-04 ENCOUNTER — Encounter: Payer: Self-pay | Admitting: Emergency Medicine

## 2020-08-04 ENCOUNTER — Emergency Department
Admission: EM | Admit: 2020-08-04 | Discharge: 2020-08-04 | Disposition: A | Discharge status: Home / Self Care | Payer: BC Managed Care – PPO | Attending: Emergency Medicine | Admitting: Emergency Medicine

## 2020-08-04 DIAGNOSIS — J449 Chronic obstructive pulmonary disease, unspecified: Secondary | ICD-10-CM | POA: Diagnosis not present

## 2020-08-04 DIAGNOSIS — Z7951 Long term (current) use of inhaled steroids: Secondary | ICD-10-CM | POA: Diagnosis not present

## 2020-08-04 DIAGNOSIS — M4319 Spondylolisthesis, multiple sites in spine: Secondary | ICD-10-CM | POA: Diagnosis not present

## 2020-08-04 DIAGNOSIS — K769 Liver disease, unspecified: Secondary | ICD-10-CM | POA: Diagnosis not present

## 2020-08-04 DIAGNOSIS — R1012 Left upper quadrant pain: Secondary | ICD-10-CM | POA: Insufficient documentation

## 2020-08-04 DIAGNOSIS — R748 Abnormal levels of other serum enzymes: Secondary | ICD-10-CM | POA: Insufficient documentation

## 2020-08-04 DIAGNOSIS — F17210 Nicotine dependence, cigarettes, uncomplicated: Secondary | ICD-10-CM | POA: Insufficient documentation

## 2020-08-04 DIAGNOSIS — I700 Atherosclerosis of aorta: Secondary | ICD-10-CM | POA: Diagnosis not present

## 2020-08-04 DIAGNOSIS — N281 Cyst of kidney, acquired: Secondary | ICD-10-CM | POA: Diagnosis not present

## 2020-08-04 DIAGNOSIS — R1010 Upper abdominal pain, unspecified: Secondary | ICD-10-CM

## 2020-08-04 LAB — COMPREHENSIVE METABOLIC PANEL
ALT: 16 U/L (ref 0–44)
AST: 20 U/L (ref 15–41)
Albumin: 4.1 g/dL (ref 3.5–5.0)
Alkaline Phosphatase: 60 U/L (ref 38–126)
Anion gap: 5 (ref 5–15)
BUN: 15 mg/dL (ref 6–20)
CO2: 29 mmol/L (ref 22–32)
Calcium: 9.2 mg/dL (ref 8.9–10.3)
Chloride: 106 mmol/L (ref 98–111)
Creatinine, Ser: 0.93 mg/dL (ref 0.44–1.00)
GFR, Estimated: >60 mL/min (ref >60)
Glucose, Bld: 98 mg/dL (ref 70–99)
Potassium: 4 mmol/L (ref 3.5–5.1)
Sodium: 140 mmol/L (ref 135–145)
Total Bilirubin: 0.5 mg/dL (ref 0.3–1.2)
Total Protein: 7.4 g/dL (ref 6.5–8.1)

## 2020-08-04 LAB — URINALYSIS, COMPLETE (UACMP) WITH MICROSCOPIC
Bacteria, UA: NONE SEEN
Bilirubin Urine: NEGATIVE
Glucose, UA: NEGATIVE mg/dL
Hgb urine dipstick: NEGATIVE
Ketones, ur: NEGATIVE mg/dL
Leukocytes,Ua: NEGATIVE
Nitrite: NEGATIVE
Protein, ur: NEGATIVE mg/dL
Specific Gravity, Urine: 1.003 — ABNORMAL LOW (ref 1.005–1.030)
pH: 6 (ref 5.0–8.0)

## 2020-08-04 LAB — CBC
HCT: 33.4 % — ABNORMAL LOW (ref 36.0–46.0)
Hemoglobin: 11.5 g/dL — ABNORMAL LOW (ref 12.0–15.0)
MCH: 33.8 pg (ref 26.0–34.0)
MCHC: 34.4 g/dL (ref 30.0–36.0)
MCV: 98.2 fL (ref 80.0–100.0)
Platelets: 324 10*3/uL (ref 150–400)
RBC: 3.4 MIL/uL — ABNORMAL LOW (ref 3.87–5.11)
RDW: 12.3 % (ref 11.5–15.5)
WBC: 7.6 10*3/uL (ref 4.0–10.5)
nRBC: 0 % (ref 0.0–0.2)

## 2020-08-04 LAB — TRIGLYCERIDES: Triglycerides: 68 mg/dL (ref <150)

## 2020-08-04 LAB — LIPASE, BLOOD: Lipase: 296 U/L — ABNORMAL HIGH (ref 11–51)

## 2020-08-04 MED ORDER — TRAMADOL HCL 50 MG PO TABS: 50.0000 mg | ORAL_TABLET | Freq: Four times a day (QID) | ORAL | 0 refills | Status: AC | PRN

## 2020-08-04 MED ORDER — IOHEXOL 300 MG/ML  SOLN
100.0000 mL | Freq: Once | INTRAMUSCULAR | Status: AC | PRN
  Administered 2020-08-04: 100 mL via INTRAVENOUS
  Filled 2020-08-04: qty 100

## 2020-08-04 NOTE — ED Triage Notes (Signed)
C/O LUQ abdominal pain x 3-4 weeks.  Sent to ED from Urgent care to evaluate pancreas.  Has appointment with GI on 8/30 -- patient normally takes medication for nausea, which helps her eat.  AAOx3  skin warm and dry.  NAD .

## 2020-08-04 NOTE — ED Notes (Signed)
See triage note  Presents with left sided abd pain for about 2-3 weeks  Dr Jari Pigg in with pt on arrival to ED

## 2020-08-04 NOTE — ED Provider Notes (Signed)
[facility] Emergency Department Provider Note  ____________________________________________   Event Date/Time   First MD Initiated Contact with Patient 08/04/20 1436     (approximate)  I have reviewed the triage vital signs and the nursing notes.   HISTORY  Chief Complaint Abdominal Pain    HPI Sheryl Perez is a 60 y.o. female with COPD, depression who comes in with concerns for left upper quadrant pain.  Patient reports having pain in this area for over a month.  She states that the pain is worse with certain movements.  She is not been taking anything to help with the pain.  Currently it is only a 2 out of 10 declines pain medication.  She does report having significant weight loss over the past 2 years.  Is already had her gallbladder removed.  Denies any chest pain, shortness of breath new from prior given her history of COPD.  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    Bipolar disorder (HCC)    Chest pain, unspecified    Chronic low back pain 10/23/2013   Chronic UTI    COPD (chronic obstructive pulmonary disease) (HCC)    Depression    Dysfunction of eustachian tube    Enlargement of lymph nodes    History of Bell's palsy Right   Injury, other and unspecified, knee, leg, ankle, and foot    MRSA (methicillin resistant staph aureus) culture positive 07-11-14   abscess   Other specified disease of hair and hair follicles    Rectal prolapse    Unspecified symptom associated with female genital organs     Patient Active Problem List   Diagnosis Date Noted   Spider bite 06/30/2020   Acute recurrent maxillary sinusitis 05/29/2020   Atherosclerosis of aorta ([place]) 04/22/2020   Panlobular emphysema ([place]) 04/22/2020   Hyperkalemia 03/03/2020   Suspected COVID-19 virus infection  03/03/2020   Other specified anxiety disorders 02/29/2020   Annual physical exam 12/14/2019   Abrasion of right arm 12/14/2019   Cannabis use disorder, moderate, in early remission ([place]) 05/24/2019   Bipolar disorder, in full remission, most recent episode mixed ([place]) 05/01/2019   High risk medication use 05/01/2019   Bipolar 1 disorder, mixed, moderate ([place]) 08/10/2018   Allergic rhinitis due to allergen 03/18/2016   Abscess, axilla 04/16/2015   Bipolar disorder, manic ([place]) 01/15/2015   Cannabis use disorder, moderate, dependence ([place]) 01/14/2015   Tobacco use disorder 07/11/2014   Chronic low back pain 10/23/2013   Borderline personality disorder ([place]) 07/28/2012   Bipolar I disorder, most recent episode (or current) manic ([place]) 07/28/2012    Past Surgical History:  Procedure Laterality Date   ABDOMINAL HYSTERECTOMY     bladder tack  2010   CHOLECYSTECTOMY  2002   FOOT SURGERY Bilateral 2004   bunion   GANGLION CYST EXCISION  2002   INCISION AND DRAINAGE ABSCESS  04-16-15   PARTIAL HYSTERECTOMY  2010   uterus removed    Prior to Admission medications   Medication Sig Start Date End Date Taking? Authorizing Provider  ADVAIR DISKUS 250-50 MCG/DOSE AEPB SMARTSIG:2 Puff(s) By Mouth Twice Daily 04/17/19   [provider]  albuterol (VENTOLIN HFA) 108 (90 Base) MCG/ACT inhaler Inhale 2 puffs into the lungs every 4 (  four) hours as needed for wheezing or shortness of breath. 05/01/20   Cletis Athens, MD  ALPRAZolam Duanne Moron) 0.25 MG tablet Take 0.25 mg by mouth at bedtime. 05/26/17   [provider]  Ascorbic Acid (VITAMIN C) 1000 MG tablet Take 1,000 mg by mouth daily.    [provider]  cyclobenzaprine (FLEXERIL) 5 MG tablet Take 5 mg by mouth 2 (two) times daily at 10 AM and 5 PM.     [provider]  divalproex (DEPAKOTE) 250 MG DR tablet Take 1 tablet (250 mg total) by mouth 3 (three) times daily. 04/21/20   Ursula Alert, MD  fluticasone (FLONASE)  50 MCG/ACT nasal spray Place 1 spray into both nostrils daily. 05/19/20 05/19/21  Blake Divine, MD  hydrOXYzine (ATARAX/VISTARIL) 10 MG tablet TAKE 1 TABLET (10 MG TOTAL) BY MOUTH 2 (TWO) TIMES DAILY AS NEEDED. FOR SEVERE ANXIETY ATTACKS ONLY 03/24/20   Ursula Alert, MD  KLOR-CON M20 20 MEQ tablet Take 20 mEq by mouth 2 (two) times daily.  05/26/17   [provider]  loratadine (CLARITIN) 10 MG tablet TAKE 1 TABLET BY MOUTH EVERY DAY 07/22/20   Cletis Athens, MD  megestrol (MEGACE) 400 MG/10ML suspension Take 10 mLs (400 mg total) by mouth daily. 04/12/17   Ursula Alert, MD  methylPREDNISolone (MEDROL DOSEPAK) 4 MG TBPK tablet Use as directed 05/29/20   Cletis Athens, MD  Multiple Vitamin (MULTIVITAMIN) tablet Take 1 tablet by mouth daily.    [provider]  ondansetron (ZOFRAN-ODT) 4 MG disintegrating tablet  07/31/18   [provider]  pantoprazole (PROTONIX) 40 MG tablet  07/31/18   [provider]  Potassium Chloride ER 20 MEQ TBCR  08/08/18   [provider]  propranolol (INDERAL) 20 MG tablet Take 1 tablet (20 mg total) by mouth 3 (three) times daily. 04/21/20   Ursula Alert, MD  QUEtiapine (SEROQUEL) 50 MG tablet Take 1 tablet (50 mg total) by mouth at bedtime as needed. 11/07/19   Ursula Alert, MD  triamcinolone cream (KENALOG) 0.1 % Apply 1 application topically 2 (two) times daily. 06/30/20   Cletis Athens, MD    Allergies Sulfacetamide sodium, Other, Sulfa antibiotics, Abilify [aripiprazole], Cephalosporins, Erythromycin, Lamotrigine, and Risperidone  Family History  Problem Relation Age of Onset   Parkinson's disease Maternal Grandfather    Diabetes Father    Cancer Father        brain tumor   Depression Father    Diabetes Paternal Grandfather    Colon polyps Mother    Heart disease Maternal Grandmother    Heart attack Maternal Grandmother    Stroke Maternal Grandmother    Depression Sister    Alcohol abuse Brother     Depression Brother    Breast cancer Paternal Aunt    Breast cancer Cousin     Social History Social History   Tobacco Use   Smoking status: Every Day    Packs/day: 0.50    Years: 40.00    Pack years: 20.00    Types: Cigarettes   Smokeless tobacco: Never   Tobacco comments:    reported on 11/07/2019  Vaping Use   Vaping Use: Never used  Substance Use Topics   Alcohol use: No    Alcohol/week: 1.0 standard drink    Types: 1 Glasses of wine per week    Comment: occasional   Drug use: Yes    Types: Marijuana    Comment: last used 05-20-15      Review of Systems  Constitutional: No fever/chills Eyes: No visual changes. ENT: No sore throat. Cardiovascular: Denies chest pain. Respiratory: Denies shortness of breath. Gastrointestinal: Positive abdominal pain no nausea, no vomiting.  Chronic diarrhea Genitourinary: Negative for dysuria. Musculoskeletal: Negative for back pain. Skin: Negative for rash. Neurological: Negative for headaches, focal weakness or numbness. All other ROS negative ____________________________________________   PHYSICAL EXAM:  VITAL SIGNS: ED Triage Vitals  Enc Vitals Group     BP 08/04/20 1234 (!) 143/51     Pulse Rate 08/04/20 1234 70     Resp 08/04/20 1234 16     Temp 08/04/20 1234 98.1 F (36.7 C)     Temp Source 08/04/20 1234 Oral     SpO2 08/04/20 1234 100 %     Weight 08/04/20 1232 121 lb 11.1 oz (55.2 kg)     Height 08/04/20 1232 5' (1.524 m)     Head Circumference --      Peak Flow --      Pain Score 08/04/20 1232 8     Pain Loc --      Pain Edu? --      Excl. in [place]? --     Constitutional: Alert and oriented. Well appearing and in no acute distress. Eyes: Conjunctivae are normal. EOMI. Head: Atraumatic. Nose: No congestion/rhinnorhea. Mouth/Throat: Mucous membranes are moist.   Neck: No stridor. Trachea Midline. FROM Cardiovascular: Normal rate, regular rhythm. Grossly normal heart sounds.  Good peripheral  circulation. Respiratory: Normal respiratory effort.  No retractions. Lungs CTAB. Gastrointestinal: Mild tenderness in the left upper quadrant.  No distention. No abdominal bruits.  Musculoskeletal: No lower extremity tenderness nor edema.  No joint effusions. Neurologic:  Normal speech and language. No gross focal neurologic deficits are appreciated.  Skin:  Skin is warm, dry and intact. No rash noted. Psychiatric: Mood and affect are normal. Speech and behavior are normal. GU: Deferred   ____________________________________________   LABS (all labs ordered are listed, but only abnormal results are displayed)  Labs Reviewed  LIPASE, BLOOD - Abnormal; Notable for the following components:      Result Value   Lipase 296 (*)    All other components within normal limits  CBC - Abnormal; Notable for the following components:   RBC 3.40 (*)    Hemoglobin 11.5 (*)    HCT 33.4 (*)    All other components within normal limits  URINALYSIS, COMPLETE (UACMP) WITH MICROSCOPIC - Abnormal; Notable for the following components:   Color, Urine COLORLESS (*)    APPearance CLEAR (*)    Specific Gravity, Urine 1.003 (*)    All other components within normal limits  COMPREHENSIVE METABOLIC PANEL   ____________________________________________  RADIOLOGY   Official radiology report(s): CT ABDOMEN PELVIS W CONTRAST  Result Date: 08/04/2020 CLINICAL DATA:  Left upper quadrant pain EXAM: CT ABDOMEN AND PELVIS WITH CONTRAST TECHNIQUE: Multidetector CT imaging of the abdomen and pelvis was performed using the standard protocol following bolus administration of intravenous contrast. CONTRAST:  114mL OMNIPAQUE IOHEXOL 300 MG/ML  SOLN COMPARISON:  Ultrasound 01/19/2016, FINDINGS: Lower chest: Lung bases demonstrate no acute consolidation or effusion. Normal cardiac size. Hepatobiliary: Status post cholecystectomy. No biliary dilatation. 19 mm hypoechoic lesion at the inferior right hepatic lobe, series 2,  image 36. Pancreas: No inflammatory changes.  Prominent pancreatic duct. Spleen: Normal in size without focal abnormality. Adrenals/Urinary Tract: Adrenal glands are normal. Cysts in the left kidney. No hydronephrosis. The bladder is normal. Stomach/Bowel: Stomach is within normal limits. Appendix appears normal. No  evidence of bowel wall thickening, distention, or inflammatory changes. Vascular/Lymphatic: Mild aortic atherosclerosis. No aneurysm. No suspicious nodes Reproductive: Status post hysterectomy. No adnexal masses. Other: Negative for free air or free fluid. Musculoskeletal: Grade 1 anterolisthesis L4 on L5 and L5 on S1. IMPRESSION: 1. No CT evidence for acute intra-abdominal or pelvic abnormality. 2. 19 mm hypoechoic lesion inferior right hepatic lobe indeterminate for focus of fat infiltration or hypodense mass. When the patient is clinically stable and able to follow directions and hold their breath (preferably as an outpatient) further evaluation with dedicated abdominal MRI should be considered. Electronically Signed   By: Donavan Foil M.D.   On: 08/04/2020 15:57    ____________________________________________   PROCEDURES  Procedure(s) performed (including Critical Care):  Procedures   ____________________________________________   INITIAL IMPRESSION / ASSESSMENT AND PLAN / ED COURSE  AMARRA SAWYER was evaluated in Emergency Department on 08/04/2020 for the symptoms described in the history of present illness. She was evaluated in the context of the global COVID-19 pandemic, which necessitated consideration that the patient might be at risk for infection with the SARS-CoV-2 virus that causes COVID-19. Institutional protocols and algorithms that pertain to the evaluation of patients at risk for COVID-19 are in a state of rapid change based on information released by regulatory bodies including the CDC and federal and state organizations. These policies and algorithms were followed  during the patient's care in the ED.    Patient comes in with upper abdominal pain that has been going on for 3-4 weeks worse with certain movements.  Labs ordered from triage to evaluate for pancreatitis.  Low suspicion for gallstone pathology given patient has had her gallbladder removed but could be a retained stone.  However her LFTs are normal therefore I do not think it is a choledocholithiasis.  We will add on triglyceride. Denies alcohol use. Given patient's weight loss we will get a CT scan to make sure no evidence of pancreatic cancer given her weight loss.  Given how long this has been going on and is worse with certain movements I do not think patient has ACS.    CT scan is negative for pancreatitis but does show a lesion on her liver.  I did discuss this with patient and how she needs to follow-up for an abdominal MRI and explained to her that there was no findings of pancreatitis on her CT so it is interesting that her lipase is elevated.  I explained to her that sometimes this can be elevated from cancer and that she needs to talk to her primary care doctor about further working her up for potential cancer especially given her weight loss.  She expressed understanding we will follow-up.  We will give her a few pain medications in the meantime.  She started taking Tylenol and naproxen at home.  Instructed not to drive while on this.  Instructed that can cause some constipation.  I discussed the provisional nature of ED diagnosis, the treatment so far, the ongoing plan of care, follow up appointments and return precautions with the patient and any family or support people present. They expressed understanding and agreed with the plan, discharged home.             ____________________________________________   FINAL CLINICAL IMPRESSION(S) / ED DIAGNOSES   Final diagnoses:  Elevated lipase  Pain of upper abdomen      MEDICATIONS GIVEN DURING THIS VISIT:  Medications   iohexol (OMNIPAQUE) 300 MG/ML solution 100 mL (100  mLs Intravenous Contrast Given 08/04/20 1532)     ED Discharge Orders          Ordered    traMADol (ULTRAM) 50 MG tablet  Every 6 hours PRN        08/04/20 1636             Note:  This document was prepared using Dragon voice recognition software and may include unintentional dictation errors.    Vanessa [place], MD 08/04/20 [phone]

## 2020-08-04 NOTE — Discharge Instructions (Addendum)
Due to your weight loss and your lipase being elevated he should talk to your primary care doctor about working you up for potential cancer.  Your CT scan is as below and will need to have an MRI of your abdomen.  Return to the ER for fevers or any other concerns.   IMPRESSION: 1. No CT evidence for acute intra-abdominal or pelvic abnormality. 2. 19 mm hypoechoic lesion inferior right hepatic lobe indeterminate for focus of fat infiltration or hypodense mass. When the patient is clinically stable and able to follow directions and hold their breath (preferably as an outpatient) further evaluation with dedicated abdominal MRI should be considered.  Take oxycodone as prescribed. Do not drink alcohol, drive or participate in any other potentially dangerous activities while taking this medication as it may make you sleepy. Do not take this medication with any other sedating medications, either prescription or over-the-counter. If you were prescribed Percocet or Vicodin, do not take these with acetaminophen (Tylenol) as it is already contained within these medications.  This medication is an opiate (or narcotic) pain medication and can be habit forming. Use it as little as possible to achieve adequate pain control. Do not use or use it with extreme caution if you have a history of opiate abuse or dependence. If you are on a pain contract with your primary care doctor or a pain specialist, be sure to let them know you were prescribed this medication today from the Emergency Department. This medication is intended for your use only - do not give any to anyone else and keep it in a secure place where nobody else, especially children, have access to it.

## 2020-08-05 ENCOUNTER — Other Ambulatory Visit: Payer: Self-pay | Admitting: Internal Medicine

## 2020-08-07 ENCOUNTER — Ambulatory Visit
Admission: RE | Admit: 2020-08-07 | Discharge: 2020-08-07 | Disposition: A | Discharge status: Home / Self Care | Payer: BC Managed Care – PPO | Source: Ambulatory Visit | Attending: Family Medicine | Admitting: Family Medicine

## 2020-08-07 ENCOUNTER — Ambulatory Visit (INDEPENDENT_AMBULATORY_CARE_PROVIDER_SITE_OTHER): Payer: BC Managed Care – PPO | Admitting: Family Medicine

## 2020-08-07 ENCOUNTER — Other Ambulatory Visit: Payer: Self-pay

## 2020-08-07 ENCOUNTER — Encounter: Payer: Self-pay | Admitting: Family Medicine

## 2020-08-07 VITALS — BP 117/68 | HR 89 | Ht 60.0 in | Wt 120.2 lb

## 2020-08-07 DIAGNOSIS — K769 Liver disease, unspecified: Secondary | ICD-10-CM | POA: Insufficient documentation

## 2020-08-07 DIAGNOSIS — R1900 Intra-abdominal and pelvic swelling, mass and lump, unspecified site: Secondary | ICD-10-CM | POA: Diagnosis not present

## 2020-08-07 DIAGNOSIS — N281 Cyst of kidney, acquired: Secondary | ICD-10-CM | POA: Diagnosis not present

## 2020-08-07 DIAGNOSIS — F3110 Bipolar disorder, current episode manic without psychotic features, unspecified: Secondary | ICD-10-CM

## 2020-08-07 DIAGNOSIS — K8689 Other specified diseases of pancreas: Secondary | ICD-10-CM | POA: Diagnosis not present

## 2020-08-07 MED ORDER — ALPRAZOLAM 1 MG PO TABS: 1.0000 mg | ORAL_TABLET | Freq: Two times a day (BID) | ORAL | 0 refills | Status: DC | PRN

## 2020-08-07 MED ORDER — GADOBUTROL 1 MMOL/ML IV SOLN
5.0000 mL | Freq: Once | INTRAVENOUS | Status: AC | PRN
  Administered 2020-08-07: 5 mL via INTRAVENOUS

## 2020-08-07 MED ORDER — ALPRAZOLAM 0.5 MG PO TABS: 0.5000 mg | ORAL_TABLET | Freq: Two times a day (BID) | ORAL | 1 refills | Status: DC | PRN

## 2020-08-07 NOTE — Progress Notes (Signed)
Established Patient Office Visit  SUBJECTIVE:  Subjective  Patient ID: Sheryl Perez, female    DOB: [birth year]  Age: 60 y.o. MRN: [id number]  CC:  Chief Complaint  Patient presents with   Abdominal Pain    HPI Sheryl Perez is a 60 y.o. 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    Bipolar disorder ([place])    Chest pain, unspecified    Chronic low back pain 10/23/2013   Chronic UTI    COPD (chronic obstructive pulmonary disease) (HCC)    Depression    Dysfunction of eustachian tube    Enlargement of lymph nodes    History of Bell's palsy Right   Injury, other and unspecified, knee, leg, ankle, and foot    MRSA (methicillin resistant staph aureus) culture positive 07-11-14   abscess   Other specified disease of hair and hair follicles    Rectal prolapse    Unspecified symptom associated with female genital organs     Past Surgical History:  Procedure Laterality Date   ABDOMINAL HYSTERECTOMY     bladder tack  2010   CHOLECYSTECTOMY  2002   FOOT SURGERY Bilateral 2004   bunion   GANGLION CYST EXCISION  2002   INCISION AND DRAINAGE ABSCESS  04-16-15   PARTIAL HYSTERECTOMY  2010   uterus removed    Family History  Problem Relation Age of Onset   Parkinson's disease Maternal Grandfather    Diabetes Father    Cancer Father        brain tumor   Depression Father    Diabetes Paternal Grandfather    Colon polyps Mother    Heart disease Maternal Grandmother    Heart attack Maternal Grandmother    Stroke Maternal Grandmother    Depression Sister    Alcohol abuse Brother    Depression Brother    Breast cancer Paternal Aunt    Breast cancer Cousin     Social History   Socioeconomic History   Marital status: Married    Spouse name: Not on file   Number of children: 3   Years of education: hs   Highest education level: Some college, no degree  Occupational  History   Not on file  Tobacco Use   Smoking status: Every Day    Packs/day: 0.50    Years: 40.00    Pack years: 20.00    Types: Cigarettes   Smokeless tobacco: Never   Tobacco comments:    reported on 11/07/2019  Vaping Use   Vaping Use: Never used  Substance and Sexual Activity   Alcohol use: No    Alcohol/week: 1.0 standard drink    Types: 1 Glasses of wine per week    Comment: occasional   Drug use: Yes    Types: Marijuana    Comment: last used 05-20-15   Sexual activity: Yes    Birth control/protection: None  Other Topics Concern   Not on file  Social History Narrative   Patient lives at home with her husband [name]).   Patient works full time.   Education CNA    Right handed.   Caffeine Tea , Soda.   Social Determinants of Health   Financial Resource Strain: Not on file  Food Insecurity: Not on file  Transportation Needs: Not on file  Physical Activity: Not on file  Stress: Not on file  Social Connections: Not on file  Intimate Partner Violence: Not on file     Current Outpatient Medications:    ADVAIR DISKUS 250-50 MCG/DOSE AEPB, SMARTSIG:2 Puff(s) By Mouth Twice Daily, Disp: , Rfl:    albuterol (VENTOLIN HFA) 108 (90 Base) MCG/ACT inhaler, Inhale 2 puffs into the lungs every 4 (four) hours as needed for wheezing or shortness of breath., Disp: 8 g, Rfl: 4   ALPRAZolam (XANAX) 0.25 MG tablet, Take 0.25 mg by mouth at bedtime., Disp: , Rfl: 0   ALPRAZolam (XANAX) 1 MG tablet, Take 1 tablet (1 mg total) by mouth 2 (two) times daily as needed for anxiety., Disp: 20 tablet, Rfl: 0   Ascorbic Acid (VITAMIN C) 1000 MG tablet, Take 1,000 mg by mouth daily., Disp: , Rfl:    cyclobenzaprine (FLEXERIL) 5 MG tablet, Take 5 mg by mouth 2 (two) times daily at 10 AM and 5 PM. , Disp: , Rfl:    divalproex (DEPAKOTE) 250 MG DR tablet, Take 1 tablet (250 mg total) by mouth 3 (three) times daily., Disp: 270 tablet, Rfl: 1   fluticasone (FLONASE) 50 MCG/ACT nasal spray, Place 1  spray into both nostrils daily., Disp: 11.1 mL, Rfl: 2   hydrOXYzine (ATARAX/VISTARIL) 10 MG tablet, TAKE 1 TABLET (10 MG TOTAL) BY MOUTH 2 (TWO) TIMES DAILY AS NEEDED. FOR SEVERE ANXIETY ATTACKS ONLY, Disp: 180 tablet, Rfl: 1   KLOR-CON M20 20 MEQ tablet, Take 20 mEq by mouth 2 (two) times daily. , Disp: , Rfl: 4   loratadine (CLARITIN) 10 MG tablet, TAKE 1 TABLET BY MOUTH EVERY DAY, Disp: 90 tablet, Rfl: 1   megestrol (MEGACE) 400 MG/10ML suspension, Take 10 mLs (400 mg total) by mouth daily., Disp: 240 mL, Rfl: 0   methylPREDNISolone (MEDROL DOSEPAK) 4 MG TBPK tablet, Use as directed, Disp: 21 tablet, Rfl: 0   Multiple Vitamin (MULTIVITAMIN) tablet, Take 1 tablet by mouth daily., Disp: , Rfl:    ondansetron (ZOFRAN-ODT) 4 MG disintegrating tablet, , Disp: , Rfl:    pantoprazole (PROTONIX) 40 MG tablet, , Disp: , Rfl:    Potassium Chloride ER 20 MEQ TBCR, , Disp: , Rfl:    propranolol (INDERAL) 20 MG tablet, Take 1 tablet (20 mg total) by mouth 3 (three) times daily., Disp: 270 tablet, Rfl: 1   QUEtiapine (SEROQUEL) 50 MG tablet, Take 1 tablet (50 mg total) by mouth at bedtime as needed., Disp: 90 tablet, Rfl: 0   traMADol (ULTRAM) 50 MG tablet, Take 1 tablet (50 mg total) by mouth every 6 (six) hours as needed for up to 5 days., Disp: 20 tablet, Rfl: 0   triamcinolone cream (KENALOG) 0.1 %, Apply 1 application topically 2 (two) times daily., Disp: 30 g, Rfl: 0   Allergies  Allergen Reactions   Sulfacetamide Sodium Swelling    Diarrhea,dizziness, tongue swelling   Other Other (See Comments) and Swelling    PT states that anything with the sides effects "may cause rash" she cannot take due to her psoriasis, it will make the patches on her hand so thick she can't use her hands.  Diarrhea,dizziness, tongue swelling   Sulfa Antibiotics Swelling    Diarrhea,dizziness, tongue swelling   Abilify [Aripiprazole] Other (See Comments)   Cephalosporins Rash   Erythromycin Other (See Comments)     Abd. pain Abd. pain Other reaction(s): Other (See Comments) Abd. pain   Lamotrigine Rash and Other (See Comments)    psorasis worsen psorasis worsen   Risperidone Other (See Comments)  tremors tremors tremors    ROS Review of Systems  Constitutional: Negative.   HENT: Negative.    Respiratory:  Positive for cough.   Gastrointestinal:  Positive for abdominal pain and nausea.  Psychiatric/Behavioral: Negative.      OBJECTIVE:    Physical Exam Cardiovascular:     Rate and Rhythm: Normal rate and regular rhythm.  Abdominal:     General: Abdomen is flat. Bowel sounds are normal.     Palpations: Abdomen is soft.     Tenderness: There is no abdominal tenderness. There is no guarding or rebound. Negative signs include Murphy's sign and McBurney's sign.  Neurological:     Mental Status: She is alert.    BP 117/68   Pulse 89   Ht 5' (1.524 m)   Wt 120 lb 3.2 oz (54.5 kg)   LMP 02/09/2008   BMI 23.47 kg/m  Wt Readings from Last 3 Encounters:  08/07/20 120 lb 3.2 oz (54.5 kg)  08/04/20 121 lb 11.1 oz (55.2 kg)  06/30/20 121 lb 12.8 oz (55.2 kg)    Health Maintenance Due  Topic Date Due   COVID-19 Vaccine (1) Never done   Pneumococcal Vaccine 30-60 Years old (1 - PCV) Never done   HIV Screening  Never done   Hepatitis C Screening  Never done   Zoster Vaccines- Shingrix (1 of 2) Never done   PAP SMEAR-Modifier  11/01/2016    There are no preventive care reminders to display for this patient.  CBC Latest Ref Rng & Units 08/04/2020 02/22/2020 12/12/2019  WBC 4.0 - 10.5 K/uL 7.6 6.0 8.2  Hemoglobin 12.0 - 15.0 g/dL 11.5(L) 11.6 12.8  Hematocrit 36.0 - 46.0 % 33.4(L) 34.7 38.8  Platelets 150 - 400 K/uL 324 380 289   CMP Latest Ref Rng & Units 08/04/2020 02/22/2020 12/12/2019  Glucose 70 - 99 mg/dL 98 86 92  BUN 6 - 20 mg/dL 15 10 11   Creatinine 0.44 - 1.00 mg/dL 0.93 1.17(H) 0.88  Sodium 135 - 145 mmol/L 140 138 140  Potassium 3.5 - 5.1 mmol/L 4.0 5.0 4.3  Chloride 98  - 111 mmol/L 106 102 102  CO2 22 - 32 mmol/L 29 21 27   Calcium 8.9 - 10.3 mg/dL 9.2 9.1 9.9  Total Protein 6.5 - 8.1 g/dL 7.4 6.6 7.0  Total Bilirubin 0.3 - 1.2 mg/dL 0.5 <0.2 0.5  Alkaline Phos 38 - 126 U/L 60 72 -  AST 15 - 41 U/L 20 15 16   ALT 0 - 44 U/L 16 13 18     Lab Results  Component Value Date   TSH 1.27 12/12/2019   Lab Results  Component Value Date   ALBUMIN 4.1 08/04/2020   ANIONGAP 5 08/04/2020   Lab Results  Component Value Date   CHOL 197 12/12/2019   CHOL 190 08/24/2018   CHOL 219 (H) 04/14/2017   HDL 51 12/12/2019   HDL 46 08/24/2018   HDL 46 04/14/2017   LDLCALC 123 (H) 12/12/2019   LDLCALC 107 (H) 08/24/2018   LDLCALC 122 (H) 04/14/2017   CHOLHDL 3.9 12/12/2019   CHOLHDL 4.1 08/24/2018   CHOLHDL 4.8 04/14/2017   Lab Results  Component Value Date   TRIG 68 08/04/2020   Lab Results  Component Value Date   HGBA1C 5.3 08/24/2018   HGBA1C 5.3 04/14/2017   HGBA1C 5.8 (H) 05/30/2015      ASSESSMENT & PLAN:   Problem List Items Addressed This Visit       Other  Intra-abdominal and pelvic swelling, mass and lump, unspecified site - Primary    Patient had CT scan Abd 2 days ago with lesion found on liver, outpatient MRI recommended. Pain Left upper quadrant has been happening for over a year now.   Plan- MRI ordered, 1 tablet Percocet ordered for pain relief so that she can remain still for the MRI.         Relevant Orders   MR Abdomen W Wo Contrast    Meds ordered this encounter  Medications   ALPRAZolam (XANAX) 1 MG tablet    Sig: Take 1 tablet (1 mg total) by mouth 2 (two) times daily as needed for anxiety.    Dispense:  20 tablet    Refill:  0        Follow-up: No follow-ups on file.    Beckie Salts, [place] [address] [place], [place] [address]

## 2020-08-07 NOTE — Assessment & Plan Note (Signed)
Patient going through new health dx and is very anxious, asked if I can refil her anxiety meds.  Plan- Will refil Xanax #20 with 1 refil.

## 2020-08-07 NOTE — Addendum Note (Signed)
Addended by: Alois Cliche on: 08/07/2020 02:50 PM   Modules accepted: Orders

## 2020-08-07 NOTE — Assessment & Plan Note (Signed)
Patient had CT scan Abd 2 days ago with lesion found on liver, outpatient MRI recommended. Pain Left upper quadrant has been happening for over a year now.   Plan- MRI ordered, 1 tablet Percocet ordered for pain relief so that she can remain still for the MRI.

## 2020-08-07 NOTE — Addendum Note (Signed)
Addended by: Beckie Salts on: 08/07/2020 03:29 PM   Modules accepted: Orders, Level of Service

## 2020-08-08 LAB — CANCER ANTIGEN 19-9: CA 19-9: 35 U/mL — ABNORMAL HIGH (ref <34)

## 2020-08-08 NOTE — Addendum Note (Signed)
Addended by: Alois Cliche on: 08/08/2020 10:16 AM   Modules accepted: Orders

## 2020-08-15 ENCOUNTER — Inpatient Hospital Stay: Payer: BC Managed Care – PPO | Attending: Oncology | Admitting: Oncology

## 2020-08-15 ENCOUNTER — Inpatient Hospital Stay: Payer: BC Managed Care – PPO

## 2020-08-15 ENCOUNTER — Encounter: Payer: Self-pay | Admitting: Oncology

## 2020-08-15 ENCOUNTER — Encounter (INDEPENDENT_AMBULATORY_CARE_PROVIDER_SITE_OTHER): Payer: Self-pay

## 2020-08-15 VITALS — BP 113/54 | HR 68 | Temp 96.9°F | Resp 16 | Wt 123.0 lb

## 2020-08-15 DIAGNOSIS — J431 Panlobular emphysema: Secondary | ICD-10-CM | POA: Diagnosis not present

## 2020-08-15 DIAGNOSIS — C253 Malignant neoplasm of pancreatic duct: Secondary | ICD-10-CM | POA: Insufficient documentation

## 2020-08-15 DIAGNOSIS — Z811 Family history of alcohol abuse and dependence: Secondary | ICD-10-CM

## 2020-08-15 DIAGNOSIS — K869 Disease of pancreas, unspecified: Secondary | ICD-10-CM

## 2020-08-15 DIAGNOSIS — N281 Cyst of kidney, acquired: Secondary | ICD-10-CM | POA: Diagnosis not present

## 2020-08-15 DIAGNOSIS — Z882 Allergy status to sulfonamides status: Secondary | ICD-10-CM | POA: Diagnosis not present

## 2020-08-15 DIAGNOSIS — Z9049 Acquired absence of other specified parts of digestive tract: Secondary | ICD-10-CM

## 2020-08-15 DIAGNOSIS — R1900 Intra-abdominal and pelvic swelling, mass and lump, unspecified site: Secondary | ICD-10-CM | POA: Diagnosis not present

## 2020-08-15 DIAGNOSIS — R1013 Epigastric pain: Secondary | ICD-10-CM | POA: Insufficient documentation

## 2020-08-15 DIAGNOSIS — Z823 Family history of stroke: Secondary | ICD-10-CM | POA: Insufficient documentation

## 2020-08-15 DIAGNOSIS — M545 Low back pain, unspecified: Secondary | ICD-10-CM | POA: Insufficient documentation

## 2020-08-15 DIAGNOSIS — Z87440 Personal history of urinary (tract) infections: Secondary | ICD-10-CM | POA: Insufficient documentation

## 2020-08-15 DIAGNOSIS — F17210 Nicotine dependence, cigarettes, uncomplicated: Secondary | ICD-10-CM | POA: Diagnosis not present

## 2020-08-15 DIAGNOSIS — F603 Borderline personality disorder: Secondary | ICD-10-CM | POA: Diagnosis not present

## 2020-08-15 DIAGNOSIS — I700 Atherosclerosis of aorta: Secondary | ICD-10-CM

## 2020-08-15 DIAGNOSIS — Z803 Family history of malignant neoplasm of breast: Secondary | ICD-10-CM | POA: Insufficient documentation

## 2020-08-15 DIAGNOSIS — Z79899 Other long term (current) drug therapy: Secondary | ICD-10-CM | POA: Insufficient documentation

## 2020-08-15 DIAGNOSIS — E875 Hyperkalemia: Secondary | ICD-10-CM | POA: Insufficient documentation

## 2020-08-15 DIAGNOSIS — E785 Hyperlipidemia, unspecified: Secondary | ICD-10-CM

## 2020-08-15 DIAGNOSIS — M4316 Spondylolisthesis, lumbar region: Secondary | ICD-10-CM | POA: Insufficient documentation

## 2020-08-15 DIAGNOSIS — Z7952 Long term (current) use of systemic steroids: Secondary | ICD-10-CM | POA: Insufficient documentation

## 2020-08-15 DIAGNOSIS — R109 Unspecified abdominal pain: Secondary | ICD-10-CM | POA: Diagnosis not present

## 2020-08-15 DIAGNOSIS — G8929 Other chronic pain: Secondary | ICD-10-CM | POA: Diagnosis not present

## 2020-08-15 DIAGNOSIS — Z881 Allergy status to other antibiotic agents status: Secondary | ICD-10-CM | POA: Insufficient documentation

## 2020-08-15 DIAGNOSIS — Z808 Family history of malignant neoplasm of other organs or systems: Secondary | ICD-10-CM

## 2020-08-15 DIAGNOSIS — K769 Liver disease, unspecified: Secondary | ICD-10-CM | POA: Diagnosis not present

## 2020-08-15 DIAGNOSIS — Z8249 Family history of ischemic heart disease and other diseases of the circulatory system: Secondary | ICD-10-CM | POA: Insufficient documentation

## 2020-08-15 DIAGNOSIS — Z885 Allergy status to narcotic agent status: Secondary | ICD-10-CM | POA: Diagnosis not present

## 2020-08-15 DIAGNOSIS — F1220 Cannabis dependence, uncomplicated: Secondary | ICD-10-CM | POA: Insufficient documentation

## 2020-08-15 DIAGNOSIS — M4317 Spondylolisthesis, lumbosacral region: Secondary | ICD-10-CM | POA: Diagnosis not present

## 2020-08-15 DIAGNOSIS — Z833 Family history of diabetes mellitus: Secondary | ICD-10-CM | POA: Insufficient documentation

## 2020-08-15 DIAGNOSIS — Z818 Family history of other mental and behavioral disorders: Secondary | ICD-10-CM | POA: Insufficient documentation

## 2020-08-15 DIAGNOSIS — F319 Bipolar disorder, unspecified: Secondary | ICD-10-CM | POA: Diagnosis not present

## 2020-08-15 DIAGNOSIS — Z888 Allergy status to other drugs, medicaments and biological substances status: Secondary | ICD-10-CM | POA: Insufficient documentation

## 2020-08-15 DIAGNOSIS — Z8371 Family history of colonic polyps: Secondary | ICD-10-CM | POA: Insufficient documentation

## 2020-08-15 NOTE — Progress Notes (Signed)
Patient here for initial oncology appointment, expresses concerns of  nausea, joint pain and diarrhea

## 2020-08-15 NOTE — Progress Notes (Signed)
Hematology/Oncology Consult note [facility] [phone] [phone]  Patient Care Team: Cletis Athens, MD as PCP - General (Internal Medicine) Grant Fontana, [place] as Referring Physician (Chiropractic Medicine) Christene Lye, MD (General Surgery)   Name of the patient: Sheryl Perez  [id number]  September 15, 1960    Reason for referral-liver lesion   Referring physician- dr. Mauro Kaufmann  Date of visit: 08/15/20   History of presenting illness-patient is a 60 year old female with a history of irritable bowel disease for which she follows up with [facility].  She has undergone EGD and colonoscopy with them in the past.  More recently patient was complaining of left upper quadrant and epigastric abdominal pain which causes sudden episodes of abdominal spasms and was prescribed as needed tramadol for it.  These episodes of pain are unrelated to food and are dull aching or throbbing.  She has undergone cholecystectomy in the past.  She underwent CT abdomen and pelvis with contrast in the ER on 08/04/2020 Which did not reveal any acute pathology which showed a possible hypoechoic 1.9 cm right hepatic lobe lesion.  This was followed by an MRI which showed no discrete lesion in the liver and the area of concern noted on CT scan was more likely to represent variable fat deposition.  However there was loss of normal T1 signal in the peripheral pancreas and a potential small pancreatic lesion suspicious for early pancreatic adenocarcinoma in the neck of the pancreas.  Patient has been referred to [place] for these findings.  ECOG PS- 1  Pain scale- 3   Review of systems- Review of Systems  Gastrointestinal:  Positive for abdominal pain.   Allergies  Allergen Reactions   Sulfacetamide Sodium Swelling    Diarrhea,dizziness, tongue swelling   Other Other (See Comments) and Swelling    PT states that anything with the sides effects "may cause rash" she cannot take due  to her psoriasis, it will make the patches on her hand so thick she can't use her hands.  Diarrhea,dizziness, tongue swelling   Oxycontin [Oxycodone Hcl] Nausea And Vomiting   Sulfa Antibiotics Swelling    Diarrhea,dizziness, tongue swelling   Abilify [Aripiprazole] Other (See Comments)   Cephalosporins Rash   Erythromycin Other (See Comments)    Abd. pain Abd. pain Other reaction(s): Other (See Comments) Abd. pain   Lamotrigine Rash and Other (See Comments)    psorasis worsen psorasis worsen   Risperidone Other (See Comments)    tremors tremors tremors    Patient Active Problem List   Diagnosis Date Noted   Intra-abdominal and pelvic swelling, mass and lump, unspecified site 08/07/2020   Liver lesion 08/07/2020   Spider bite 06/30/2020   Acute recurrent maxillary sinusitis 05/29/2020   Atherosclerosis of aorta ([place]) 04/22/2020   Panlobular emphysema ([place]) 04/22/2020   Hyperkalemia 03/03/2020   Suspected COVID-19 virus infection 03/03/2020   Other specified anxiety disorders 02/29/2020   Annual physical exam 12/14/2019   Abrasion of right arm 12/14/2019   Cannabis use disorder, moderate, in early remission ([place]) 05/24/2019   Bipolar disorder, in full remission, most recent episode mixed ([place]) 05/01/2019   High risk medication use 05/01/2019   Bipolar 1 disorder, mixed, moderate ([place]) 08/10/2018   Allergic rhinitis due to allergen 03/18/2016   Abscess, axilla 04/16/2015   Bipolar disorder, manic ([place]) 01/15/2015   Cannabis use disorder, moderate, dependence ([place]) 01/14/2015   Tobacco use disorder 07/11/2014   Chronic low back pain 10/23/2013   Borderline personality disorder (  [place]) 07/28/2012   Bipolar I disorder, most recent episode (or current) manic ([place]) 07/28/2012     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    Bipolar disorder (HCC)    Chest pain, unspecified    Chronic low back pain 10/23/2013    Chronic UTI    COPD (chronic obstructive pulmonary disease) (HCC)    Depression    Dysfunction of eustachian tube    Enlargement of lymph nodes    History of Bell's palsy Right   Injury, other and unspecified, knee, leg, ankle, and foot    MRSA (methicillin resistant staph aureus) culture positive 07-11-14   abscess   Other specified disease of hair and hair follicles    Rectal prolapse    Unspecified symptom associated with female genital organs      Past Surgical History:  Procedure Laterality Date   ABDOMINAL HYSTERECTOMY     bladder tack  2010   CHOLECYSTECTOMY  2002   FOOT SURGERY Bilateral 2004   bunion   GANGLION CYST EXCISION  2002   INCISION AND DRAINAGE ABSCESS  04-16-15   PARTIAL HYSTERECTOMY  2010   uterus removed    Social History   Socioeconomic History   Marital status: Married    Spouse name: Not on file   Number of children: 3   Years of education: hs   Highest education level: Some college, no degree  Occupational History   Not on file  Tobacco Use   Smoking status: Every Day    Packs/day: 0.50    Years: 40.00    Pack years: 20.00    Types: Cigarettes   Smokeless tobacco: Never   Tobacco comments:    reported on 11/07/2019  Vaping Use   Vaping Use: Never used  Substance and Sexual Activity   Alcohol use: No    Alcohol/week: 1.0 standard drink    Types: 1 Glasses of wine per week    Comment: occasional   Drug use: Yes    Types: Marijuana    Comment: last used 05-20-15   Sexual activity: Yes    Birth control/protection: None  Other Topics Concern   Not on file  Social History Narrative   Patient lives at home with her husband [name]).   Patient works full time.   Education CNA    Right handed.   Caffeine Tea , Soda.   Social Determinants of Health   Financial Resource Strain: Not on file  Food Insecurity: Not on file  Transportation Needs: Not on file  Physical Activity: Not on file  Stress: Not on file  Social Connections: Not  on file  Intimate Partner Violence: Not on file     Family History  Problem Relation Age of Onset   Parkinson's disease Maternal Grandfather    Diabetes Father    Cancer Father        brain tumor   Depression Father    Diabetes Paternal Grandfather    Colon polyps Mother    Heart disease Maternal Grandmother    Heart attack Maternal Grandmother    Stroke Maternal Grandmother    Depression Sister    Alcohol abuse Brother    Depression Brother    Breast cancer Paternal Aunt    Breast cancer Cousin      Current Outpatient Medications:    albuterol (VENTOLIN HFA) 108 (90 Base) MCG/ACT inhaler, Inhale 2 puffs into the lungs  every 4 (four) hours as needed for wheezing or shortness of breath., Disp: 8 g, Rfl: 4   ALPRAZolam (XANAX) 0.5 MG tablet, Take 1 tablet (0.5 mg total) by mouth 2 (two) times daily as needed for anxiety., Disp: 20 tablet, Rfl: 1   ALPRAZolam (XANAX) 1 MG tablet, Take 1 tablet (1 mg total) by mouth 2 (two) times daily as needed for anxiety., Disp: 20 tablet, Rfl: 0   Ascorbic Acid (VITAMIN C) 1000 MG tablet, Take 1,000 mg by mouth daily., Disp: , Rfl:    cyclobenzaprine (FLEXERIL) 5 MG tablet, Take 5 mg by mouth 2 (two) times daily at 10 AM and 5 PM. , Disp: , Rfl:    divalproex (DEPAKOTE) 250 MG DR tablet, Take 1 tablet (250 mg total) by mouth 3 (three) times daily., Disp: 270 tablet, Rfl: 1   fluticasone (FLONASE) 50 MCG/ACT nasal spray, Place 1 spray into both nostrils daily., Disp: 11.1 mL, Rfl: 2   hydrOXYzine (ATARAX/VISTARIL) 10 MG tablet, TAKE 1 TABLET (10 MG TOTAL) BY MOUTH 2 (TWO) TIMES DAILY AS NEEDED. FOR SEVERE ANXIETY ATTACKS ONLY, Disp: 180 tablet, Rfl: 1   Loratadine 10 MG CAPS, Take by mouth., Disp: , Rfl:    methylPREDNISolone (MEDROL DOSEPAK) 4 MG TBPK tablet, Use as directed, Disp: 21 tablet, Rfl: 0   Multiple Vitamin (MULTIVITAMIN) tablet, Take 1 tablet by mouth daily., Disp: , Rfl:    ondansetron (ZOFRAN-ODT) 4 MG disintegrating tablet, , Disp:  , Rfl:    pantoprazole (PROTONIX) 40 MG tablet, , Disp: , Rfl:    propranolol (INDERAL) 20 MG tablet, Take 1 tablet (20 mg total) by mouth 3 (three) times daily., Disp: 270 tablet, Rfl: 1   ADVAIR DISKUS 250-50 MCG/DOSE AEPB, SMARTSIG:2 Puff(s) By Mouth Twice Daily (Patient not taking: Reported on 08/15/2020), Disp: , Rfl:    famotidine (PEPCID) 20 MG tablet, Take by mouth. (Patient not taking: Reported on 08/15/2020), Disp: , Rfl:    KLOR-CON M20 20 MEQ tablet, Take 20 mEq by mouth 2 (two) times daily.  (Patient not taking: Reported on 08/15/2020), Disp: , Rfl: 4   loratadine (CLARITIN) 10 MG tablet, TAKE 1 TABLET BY MOUTH EVERY DAY, Disp: 90 tablet, Rfl: 1   megestrol (MEGACE) 400 MG/10ML suspension, Take 10 mLs (400 mg total) by mouth daily. (Patient not taking: Reported on 08/15/2020), Disp: 240 mL, Rfl: 0   Potassium Chloride ER 20 MEQ TBCR, , Disp: , Rfl:    QUEtiapine (SEROQUEL) 50 MG tablet, Take 1 tablet (50 mg total) by mouth at bedtime as needed., Disp: 90 tablet, Rfl: 0   triamcinolone cream (KENALOG) 0.1 %, Apply 1 application topically 2 (two) times daily. (Patient not taking: Reported on 08/15/2020), Disp: 30 g, Rfl: 0   Physical exam:  Vitals:   08/15/20 1054  BP: (!) 113/54  Pulse: 68  Resp: 16  Temp: (!) 96.9 F (36.1 C)  TempSrc: Tympanic  SpO2: 99%  Weight: 123 lb (55.8 kg)   Physical Exam Constitutional:      General: She is not in acute distress. Cardiovascular:     Rate and Rhythm: Normal rate and regular rhythm.     Heart sounds: Normal heart sounds.  Pulmonary:     Effort: Pulmonary effort is normal.     Breath sounds: Normal breath sounds.  Abdominal:     General: Bowel sounds are normal.     Palpations: Abdomen is soft.     Comments: Mild tenderness to palpation in the left upper  quadrant beneath the eighth rib in the midclavicular line  Skin:    General: Skin is warm and dry.  Neurological:     Mental Status: She is alert and oriented to person, place, and  time.       CMP Latest Ref Rng & Units 08/04/2020  Glucose 70 - 99 mg/dL 98  BUN 6 - 20 mg/dL 15  Creatinine 0.44 - 1.00 mg/dL 0.93  Sodium 135 - 145 mmol/L 140  Potassium 3.5 - 5.1 mmol/L 4.0  Chloride 98 - 111 mmol/L 106  CO2 22 - 32 mmol/L 29  Calcium 8.9 - 10.3 mg/dL 9.2  Total Protein 6.5 - 8.1 g/dL 7.4  Total Bilirubin 0.3 - 1.2 mg/dL 0.5  Alkaline Phos 38 - 126 U/L 60  AST 15 - 41 U/L 20  ALT 0 - 44 U/L 16   CBC Latest Ref Rng & Units 08/04/2020  WBC 4.0 - 10.5 K/uL 7.6  Hemoglobin 12.0 - 15.0 g/dL 11.5(L)  Hematocrit 36.0 - 46.0 % 33.4(L)  Platelets 150 - 400 K/uL 324    No images are attached to the encounter.  MR Abdomen W Wo Contrast  Result Date: 08/07/2020 CLINICAL DATA:  Abdominal mass, intra-abdominal mass, 19 mm lesion discovered on recent CT evaluation. EXAM: MRI ABDOMEN WITHOUT AND WITH CONTRAST TECHNIQUE: Multiplanar multisequence MR imaging of the abdomen was performed both before and after the administration of intravenous contrast. CONTRAST:  29mL GADAVIST GADOBUTROL 1 MMOL/ML IV SOLN COMPARISON:  CT evaluation from August 04, 2020. FINDINGS: Lower chest: Incidental imaging of the lung bases without effusion or sign of consolidative change. Hepatobiliary: Minimal signal loss in the area of concern along the gallbladder fossa, RIGHT hepatic margin. No discrete, visible lesion on noncontrast or contrasted imaging aside from very subtle signal loss in this area. No biliary duct dilation. Pancreas: Loss of normal T1 signal in the distal, peripheral pancreas beginning at the neck of the pancreas. Pancreatic duct is mildly dilated at the site of transition and into the peripheral pancreas. No signs of pancreatic inflammation. Potential lesion in the pancreatic neck based on this appearance, not visualized as a discrete lesion on the current study.w ductal transition best demonstrated on single shot T2 images on images 12 and 13 in the pancreatic neck of series 10. This also  corresponds to transition of normal an abnormal pancreatic signal. Postcontrast image 29 of series 12 shows more rounded appearance of signal at the transition hypointense to both peripheral dorsal pancreas and ventral pancreas. This becomes more isointense on later phases to the remainder of the pancreas. Spleen:  Normal. Adrenals/Urinary Tract:  Adrenal glands are normal. Symmetric renal enhancement without hydronephrosis. Renal cysts largest on the LEFT in the upper pole measuring 3.0 x 2.2 cm. Stomach/Bowel: No acute gastrointestinal process to the extent evaluated. Vascular/Lymphatic: Patent abdominal vasculature. There is no gastrohepatic or hepatoduodenal ligament lymphadenopathy. No retroperitoneal or mesenteric lymphadenopathy. Other:  No ascites. Musculoskeletal: No suspicious bone lesions identified. IMPRESSION: 1. Ductal transition, loss of normal T1 signal in the peripheral pancreas and potential small pancreatic lesion suspicious for early pancreatic adenocarcinoma in the neck of the pancreas. Endoscopic assessment is suggested for further evaluation. 2. No signs of nodal disease. 3. Area of concern in the liver represents variable fat deposition along the gallbladder fossa. 4. No evidence of biliary duct dilation. These results will be called to the ordering clinician or representative by the Radiologist Assistant, and communication documented in the PACS or [employer]. Electronically Signed  By: Zetta Bills M.D.   On: 08/07/2020 12:28   CT ABDOMEN PELVIS W CONTRAST  Result Date: 08/04/2020 CLINICAL DATA:  Left upper quadrant pain EXAM: CT ABDOMEN AND PELVIS WITH CONTRAST TECHNIQUE: Multidetector CT imaging of the abdomen and pelvis was performed using the standard protocol following bolus administration of intravenous contrast. CONTRAST:  153mL OMNIPAQUE IOHEXOL 300 MG/ML  SOLN COMPARISON:  Ultrasound 01/19/2016, FINDINGS: Lower chest: Lung bases demonstrate no acute consolidation or  effusion. Normal cardiac size. Hepatobiliary: Status post cholecystectomy. No biliary dilatation. 19 mm hypoechoic lesion at the inferior right hepatic lobe, series 2, image 36. Pancreas: No inflammatory changes.  Prominent pancreatic duct. Spleen: Normal in size without focal abnormality. Adrenals/Urinary Tract: Adrenal glands are normal. Cysts in the left kidney. No hydronephrosis. The bladder is normal. Stomach/Bowel: Stomach is within normal limits. Appendix appears normal. No evidence of bowel wall thickening, distention, or inflammatory changes. Vascular/Lymphatic: Mild aortic atherosclerosis. No aneurysm. No suspicious nodes Reproductive: Status post hysterectomy. No adnexal masses. Other: Negative for free air or free fluid. Musculoskeletal: Grade 1 anterolisthesis L4 on L5 and L5 on S1. IMPRESSION: 1. No CT evidence for acute intra-abdominal or pelvic abnormality. 2. 19 mm hypoechoic lesion inferior right hepatic lobe indeterminate for focus of fat infiltration or hypodense mass. When the patient is clinically stable and able to follow directions and hold their breath (preferably as an outpatient) further evaluation with dedicated abdominal MRI should be considered. Electronically Signed   By: Donavan Foil M.D.   On: 08/04/2020 15:57    Assessment and plan- Patient is a 60 y.o. female referred for abnormal CT/MRI abdomen findings  MRI abdomen images were reviewed independently.  I have discussed the findings with the patient.  Possible liver lesion noted on CT scan was an area of variable fat deposition on MRI and does not require any further follow-up.  However MRI showed loss of normal T1 signal and a potential small pancreatic lesion in the neck of the pancreas for which endoscopic Assessment was recommended.  CA 19-9 is mildly elevated at 35.  I will reach out to [facility] to see if she would qualify for an EUS at this time or we need to do an MRI with MRCP first.   We will reach out to  the patient after we hear from the GI.  With regards to her abdominal pain it is not explained by the MRI findings and I would recommend that she should continue to follow-up with her PCP/[facility] for this   Thank you for this kind referral and the opportunity to participate in the care of this patient   Visit Diagnosis 1. Pancreatic lesion     Dr. Randa Evens, MD, MPH [facility] [phone] 08/15/2020

## 2020-08-19 ENCOUNTER — Encounter: Payer: Self-pay | Admitting: Psychiatry

## 2020-08-19 ENCOUNTER — Ambulatory Visit (INDEPENDENT_AMBULATORY_CARE_PROVIDER_SITE_OTHER): Payer: BC Managed Care – PPO | Admitting: Psychiatry

## 2020-08-19 ENCOUNTER — Other Ambulatory Visit: Payer: Self-pay

## 2020-08-19 ENCOUNTER — Telehealth: Payer: Self-pay

## 2020-08-19 VITALS — BP 133/68 | HR 79 | Temp 98.1°F | Wt 124.0 lb

## 2020-08-19 DIAGNOSIS — F411 Generalized anxiety disorder: Secondary | ICD-10-CM

## 2020-08-19 DIAGNOSIS — F17200 Nicotine dependence, unspecified, uncomplicated: Secondary | ICD-10-CM | POA: Diagnosis not present

## 2020-08-19 DIAGNOSIS — F603 Borderline personality disorder: Secondary | ICD-10-CM | POA: Diagnosis not present

## 2020-08-19 DIAGNOSIS — F3178 Bipolar disorder, in full remission, most recent episode mixed: Secondary | ICD-10-CM | POA: Diagnosis not present

## 2020-08-19 DIAGNOSIS — F1221 Cannabis dependence, in remission: Secondary | ICD-10-CM

## 2020-08-19 MED ORDER — GABAPENTIN 100 MG PO CAPS: 100.0000 mg | ORAL_CAPSULE | Freq: Every day | ORAL | 1 refills | Status: DC

## 2020-08-19 NOTE — Progress Notes (Signed)
Patient ID: Sheryl Perez, female   DOB: 03/03/1960, 60 y.o.   MRN: [id number]

## 2020-08-19 NOTE — Telephone Encounter (Signed)
Dr. Mont Dutton has reviewed case and feels it is reasonable to go directly to EUS. Her team at [facility] will arrange and contact Sheryl Perez with details. I have spoken to Ms. Persad and given her an update with plan. She is agreeable. Dr. Janese Banks will follow up with her results of biopsy.

## 2020-08-19 NOTE — Progress Notes (Signed)
[facility] MD OP Progress Note  08/19/2020 3:58 PM Sheryl Perez  MRN:  [id number]  Chief Complaint:  Chief Complaint   Follow-up; Anxiety; Depression    HPI: Sheryl Perez is a 60 year old Caucasian female who has a history of bipolar disorder, borderline personality disorder, cannabis use disorder in early remission, tobacco use disorder in early remission was evaluated in office today.  Patient today reports she is currently under the care of GI provider for evaluation for abdominal pain.  She underwent EGD and colonoscopy in the past.  Most recently with left upper quadrant and epigastric abdominal pain with sudden episodes of abdominal spasms and was prescribed as needed tramadol for it.  Episodes are not related to food.  She has a history of cholecystectomy in the past.  Patient on CT scan with loss of normal T1 signal in the peripheral pancreas and potential pancreatic lesion suspicious for an early pancreatic adenocarcinoma in the neck of the pancreas.  Patient today reports she is currently anxious about her current medical problems.  She does worry often however does have good support system.  She continues to work and reports work is going well.  Denies any significant depressive symptoms.  She does report loss of appetite ongoing since the past several months as well as weight loss likely related to her pancreatic lesion.  Patient denies any suicidality, homicidality or perceptual disturbances.  Patient denies any other concerns today.  Visit Diagnosis:    ICD-10-CM   1. Bipolar disorder, in full remission, most recent episode mixed (HCC)  F31.78 gabapentin (NEURONTIN) 100 MG capsule    2. GAD (generalized anxiety disorder)  F41.1 gabapentin (NEURONTIN) 100 MG capsule    3. Tobacco use disorder  F17.200     4. Borderline personality disorder ([place])  F60.3     5. Cannabis use disorder, moderate, in early remission ([place])  F12.21       Past Psychiatric History: I have  reviewed past psychiatric history from progress note on 04/12/2017.  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    Bipolar disorder (HCC)    Chest pain, unspecified    Chronic low back pain 10/23/2013   Chronic UTI    COPD (chronic obstructive pulmonary disease) (HCC)    Depression    Dysfunction of eustachian tube    Enlargement of lymph nodes    History of Bell's palsy Right   Injury, other and unspecified, knee, leg, ankle, and foot    MRSA (methicillin resistant staph aureus) culture positive 07-11-14   abscess   Other specified disease of hair and hair follicles    Rectal prolapse    Unspecified symptom associated with female genital organs     Past Surgical History:  Procedure Laterality Date   ABDOMINAL HYSTERECTOMY     bladder tack  2010   CHOLECYSTECTOMY  2002   FOOT SURGERY Bilateral 2004   bunion   GANGLION CYST EXCISION  2002   INCISION AND DRAINAGE ABSCESS  04-16-15   PARTIAL HYSTERECTOMY  2010   uterus removed    Family Psychiatric History: Reviewed family psychiatric history from progress note on 04/12/2017.  Family History:  Family History  Problem Relation Age of Onset   Parkinson's disease Maternal Grandfather    Diabetes Father    Cancer Father  brain tumor   Depression Father    Diabetes Paternal Grandfather    Colon polyps Mother    Heart disease Maternal Grandmother    Heart attack Maternal Grandmother    Stroke Maternal Grandmother    Depression Sister    Alcohol abuse Brother    Depression Brother    Breast cancer Paternal Aunt    Breast cancer Cousin     Social History: Reviewed social history from progress note on 04/12/2017. Social History   Socioeconomic History   Marital status: Married    Spouse name: Not on file   Number of children: 3   Years of education: hs    Highest education level: Some college, no degree  Occupational History   Not on file  Tobacco Use   Smoking status: Former    Packs/day: 0.50    Years: 40.00    Pack years: 20.00    Types: Cigarettes    Quit date: 07/14/2020    Years since quitting: 0.1   Smokeless tobacco: Never  Vaping Use   Vaping Use: Never used  Substance and Sexual Activity   Alcohol use: No    Alcohol/week: 1.0 standard drink    Types: 1 Glasses of wine per week    Comment: occasional   Drug use: Yes    Types: Marijuana    Comment: last used 05-20-15   Sexual activity: Yes    Birth control/protection: None  Other Topics Concern   Not on file  Social History Narrative   Patient lives at home with her husband [name]).   Patient works full time.   Education CNA    Right handed.   Caffeine Tea , Soda.   Social Determinants of Health   Financial Resource Strain: Not on file  Food Insecurity: Not on file  Transportation Needs: Not on file  Physical Activity: Not on file  Stress: Not on file  Social Connections: Not on file    Allergies:  Allergies  Allergen Reactions   Sulfacetamide Sodium Swelling    Diarrhea,dizziness, tongue swelling   Other Other (See Comments) and Swelling    PT states that anything with the sides effects "may cause rash" she cannot take due to her psoriasis, it will make the patches on her hand so thick she can't use her hands.  Diarrhea,dizziness, tongue swelling   Oxycontin [Oxycodone Hcl] Nausea And Vomiting   Sulfa Antibiotics Swelling    Diarrhea,dizziness, tongue swelling   Abilify [Aripiprazole] Other (See Comments)   Cephalosporins Rash   Erythromycin Other (See Comments)    Abd. pain Abd. pain Other reaction(s): Other (See Comments) Abd. pain   Lamotrigine Rash and Other (See Comments)    psorasis worsen psorasis worsen   Risperidone Other (See Comments)    tremors tremors tremors    Metabolic Disorder Labs: Lab Results  Component Value Date    HGBA1C 5.3 08/24/2018   MPG 105.41 08/24/2018   MPG 105.41 04/14/2017   Lab Results  Component Value Date   PROLACTIN 9.3 08/24/2018   PROLACTIN 12.5 04/14/2017   Lab Results  Component Value Date   CHOL 197 12/12/2019   TRIG 68 08/04/2020   HDL 51 12/12/2019   CHOLHDL 3.9 12/12/2019   VLDL 37 08/24/2018   LDLCALC 123 (H) 12/12/2019   LDLCALC 107 (H) 08/24/2018   Lab Results  Component Value Date   TSH 1.27 12/12/2019   TSH 2.376 04/14/2017    Therapeutic Level Labs: Lab Results  Component Value Date  LITHIUM 0.34 (L) 04/14/2017   LITHIUM 0.3 (L) 06/15/2016   Lab Results  Component Value Date   VALPROATE 17 (L) 02/22/2020   VALPROATE 31 (L) 10/05/2019   No components found for:  CBMZ  Current Medications: Current Outpatient Medications  Medication Sig Dispense Refill   ADVAIR DISKUS 250-50 MCG/DOSE AEPB      albuterol (VENTOLIN HFA) 108 (90 Base) MCG/ACT inhaler Inhale 2 puffs into the lungs every 4 (four) hours as needed for wheezing or shortness of breath. 8 g 4   ALPRAZolam (XANAX) 1 MG tablet Take 1 tablet (1 mg total) by mouth 2 (two) times daily as needed for anxiety. 20 tablet 0   Ascorbic Acid (VITAMIN C) 1000 MG tablet Take 1,000 mg by mouth daily.     fluticasone (FLONASE) 50 MCG/ACT nasal spray Place 1 spray into both nostrils daily. 11.1 mL 2   gabapentin (NEURONTIN) 100 MG capsule Take 1 capsule (100 mg total) by mouth daily with supper. 30 capsule 1   hydrOXYzine (ATARAX/VISTARIL) 10 MG tablet TAKE 1 TABLET (10 MG TOTAL) BY MOUTH 2 (TWO) TIMES DAILY AS NEEDED. FOR SEVERE ANXIETY ATTACKS ONLY 180 tablet 1   loratadine (CLARITIN) 10 MG tablet TAKE 1 TABLET BY MOUTH EVERY DAY 90 tablet 1   Multiple Vitamin (MULTIVITAMIN) tablet Take 1 tablet by mouth daily.     propranolol (INDERAL) 20 MG tablet Take 1 tablet (20 mg total) by mouth 3 (three) times daily. 270 tablet 1   QUEtiapine (SEROQUEL) 50 MG tablet Take 1 tablet (50 mg total) by mouth at bedtime as  needed. 90 tablet 0   traMADol (ULTRAM) 50 MG tablet Take by mouth every 6 (six) hours as needed.     ALPRAZolam (XANAX) 0.5 MG tablet Take 1 tablet (0.5 mg total) by mouth 2 (two) times daily as needed for anxiety. (Patient not taking: Reported on 08/19/2020) 20 tablet 1   cyclobenzaprine (FLEXERIL) 5 MG tablet Take 5 mg by mouth 2 (two) times daily at 10 AM and 5 PM.  (Patient not taking: Reported on 08/19/2020)     famotidine (PEPCID) 20 MG tablet Take by mouth. (Patient not taking: Reported on 08/15/2020)     KLOR-CON M20 20 MEQ tablet Take 20 mEq by mouth 2 (two) times daily.  (Patient not taking: Reported on 08/15/2020)  4   Loratadine 10 MG CAPS Take by mouth. (Patient not taking: Reported on 08/19/2020)     megestrol (MEGACE) 400 MG/10ML suspension Take 10 mLs (400 mg total) by mouth daily. (Patient not taking: Reported on 08/15/2020) 240 mL 0   methylPREDNISolone (MEDROL DOSEPAK) 4 MG TBPK tablet Use as directed (Patient not taking: Reported on 08/19/2020) 21 tablet 0   ondansetron (ZOFRAN-ODT) 4 MG disintegrating tablet  (Patient not taking: Reported on 08/19/2020)     pantoprazole (PROTONIX) 40 MG tablet  (Patient not taking: Reported on 08/19/2020)     Potassium Chloride ER 20 MEQ TBCR  (Patient not taking: Reported on 08/15/2020)     triamcinolone cream (KENALOG) 0.1 % Apply 1 application topically 2 (two) times daily. (Patient not taking: Reported on 08/15/2020) 30 g 0   No current facility-administered medications for this visit.     Musculoskeletal: Strength & Muscle Tone:  UTA Gait & Station: normal Patient leans: N/A  Psychiatric Specialty Exam: Review of Systems  Gastrointestinal:  Positive for abdominal pain.  Psychiatric/Behavioral:  The patient is nervous/anxious.   All other systems reviewed and are negative.  Blood pressure 133/68, pulse  79, temperature 98.1 F (36.7 C), weight 124 lb (56.2 kg), last menstrual period 02/09/2008.Body mass index is 24.22 kg/m.  General  Appearance: Casual  Eye Contact:  Good  Speech:  Clear and Coherent  Volume:  Normal  Mood:  Anxious  Affect:  Congruent  Thought Process:  Goal Directed and Descriptions of Associations: Intact  Orientation:  Full (Time, Place, and Person)  Thought Content: Logical   Suicidal Thoughts:  No  Homicidal Thoughts:  No  Memory:  Immediate;   Fair Recent;   Fair Remote;   Fair  Judgement:  Fair  Insight:  Fair  Psychomotor Activity:  Normal  Concentration:  Concentration: Fair and Attention Span: Fair  Recall:  [employer] of Knowledge: Fair  Language: Fair  Akathisia:  No  Handed:  Right  AIMS (if indicated): done  Assets:  Communication Skills Desire for [place] Talents/Skills Transportation  ADL's:  Intact  Cognition: WNL  Sleep:  Fair   Screenings: AIMS    Flowsheet Row Video Visit from 04/21/2020 in [facility] Office Visit from 05/03/2016 in [facility] Admission (Discharged) from 01/15/2015 in [facility] Total Score 0 0 0      AUDIT    Flowsheet Row Admission (Discharged) from 01/15/2015 in [facility]  Alcohol Use Disorder Identification Test Final Score (AUDIT) 1      GAD-7    Flowsheet Row Office Visit from 08/19/2020 in [facility] Office Visit from 04/18/2015 in [facility]  Total GAD-7 Score 2 14      PHQ2-9    [place] Office Visit from 08/19/2020 in [facility] Video Visit from 04/21/2020 in [facility] Office Visit from 12/14/2019 in [facility] Office Visit from 04/18/2015 in [facility] Office Visit from 07/11/2014 in [facility]  PHQ-2 Total Score 0 0 0 4 0  PHQ-9 Total Score 0 -- -- 14 --      [place] Office Visit from 08/19/2020 in [facility] ED from 08/04/2020 in [facility] ED from 05/19/2020 in [place] No Risk No Risk No Risk        Assessment and Plan: Sheryl Perez is a 60 year old Caucasian female who has a history of bipolar disorder, cannabis use disorder, tobacco use disorder, psoriasis, arthritis was evaluated in office today.  Patient is currently struggling with anxiety related to her current medical problems.  She will benefit from following plan.  Plan Bipolar disorder in remission Given her recent pancreatic problems we will discontinue Depakote. Start gabapentin 100 mg p.o. nightly.  We will increase the dosage gradually. Provided medication education. Seroquel 50 mg p.o. nightly as needed  Generalized anxiety disorder-unstable Start gabapentin 100 mg p.o. nightly Propranolol 10 mg p.o. 3 times daily Hydroxyzine 10 mg p.o. twice daily as needed for severe anxiety attacks only, patient advised to avoid using it with her antihistamine like Zyrtec.  Tobacco use disorder in early remission Patient quit smoking in early June.  Cannabis use disorder in early remission Patient has been sober  Reviewed notes per her provider Dr. Randa Evens -dated 08/15/2020-patient with recent abdominal pain-pancreatic lesion.'  I have reviewed labs and discussed-dated 08/04/2020-lipase elevated at 296, CMP-creatinine elevated at 1.17, CBC-RBC-low at 3.4, hemoglobin-low at 11.5, hematocrit low at 33.4-platelets within  normal limits.   Follow-up in clinic in 3 to 4 weeks or sooner if needed.  I have spent atleast 30 minutes face to face with patient today which includes the time spent for preparing to see the patient ( e.g., review of test, records ), obtaining and to review and separately obtained history , ordering medications and test ,psychoeducation and supportive psychotherapy and care  coordination,as well as documenting clinical information in electronic health record,interpreting and communication of test results   This note was generated in part or whole with voice recognition software. Voice recognition is usually quite accurate but there are transcription errors that can and very often do occur. I apologize for any typographical errors that were not detected and corrected.     Ursula Alert, MD 08/20/2020, 1:24 PM

## 2020-08-19 NOTE — Telephone Encounter (Signed)
Sheryl Perez EUS is scheduled for 08/27/20. Can you please call her with an appointment 3-4 days after with Dr. Janese Banks for results. Thank you

## 2020-08-19 NOTE — Telephone Encounter (Signed)
Please schedule follow up with me 3-4 days after eus. Thank you!

## 2020-08-19 NOTE — Patient Instructions (Signed)
Gabapentin Capsules or Tablets What is this medication? GABAPENTIN (GA ba pen tin) treats nerve pain. It may also be used to prevent and control seizures in people with epilepsy. It works by calming overactivenerves in your body. This medicine may be used for other purposes; ask your health care provider orpharmacist if you have questions. COMMON BRAND NAME(S): Active-PAC with Gabapentin, Gabarone, Gralise, Neurontin What should I tell my care team before I take this medication? They need to know if you have any of these conditions: Alcohol or substance use disorder Kidney disease Lung or breathing disease Suicidal thoughts, plans, or attempt; a previous suicide attempt by you or a family member An unusual or allergic reaction to gabapentin, other medications, foods, dyes, or preservatives Pregnant or trying to get pregnant Breast-feeding How should I use this medication? Take this medication by mouth with a glass of water. Follow the directions on the prescription label. You can take it with or without food. If it upsets your stomach, take it with food. Take your medication at regular intervals. Do not take it more often than directed. Do not stop taking except on your care team'sadvice. If you are directed to break the 600 or 800 mg tablets in half as part of your dose, the extra half tablet should be used for the next dose. If you have notused the extra half tablet within 28 days, it should be thrown away. A special MedGuide will be given to you by the pharmacist with eachprescription and refill. Be sure to read this information carefully each time. Talk to your care team about the use of this medication in children. While this medication may be prescribed for children as young as 3 years for selectedconditions, precautions do apply. Overdosage: If you think you have taken too much of this medicine contact apoison control center or emergency room at once. NOTE: This medicine is only for you.  Do not share this medicine with others. What if I miss a dose? If you miss a dose, take it as soon as you can. If it is almost time for yournext dose, take only that dose. Do not take double or extra doses. What may interact with this medication? Alcohol Antihistamines for allergy, cough, and cold Certain medications for anxiety or sleep Certain medications for depression like amitriptyline, fluoxetine, sertraline Certain medications for seizures like phenobarbital, primidone Certain medications for stomach problems General anesthetics like halothane, isoflurane, methoxyflurane, propofol Local anesthetics like lidocaine, pramoxine, tetracaine Medications that relax muscles for surgery Narcotic medications for pain Phenothiazines like chlorpromazine, mesoridazine, prochlorperazine, thioridazine This list may not describe all possible interactions. Give your health care provider a list of all the medicines, herbs, non-prescription drugs, or dietary supplements you use. Also tell them if you smoke, drink alcohol, or use illegaldrugs. Some items may interact with your medicine. What should I watch for while using this medication? Visit your care team for regular checks on your progress. You may want to keep a record at home of how you feel your condition is responding to treatment. You may want to share this information with your care team at each visit. You should contact your care team if your seizures get worse or if you have any new types of seizures. Do not stop taking this medication or any of your seizure medications unless instructed by your care team. Stopping your medicationsuddenly can increase your seizures or their severity. This medication may cause serious skin reactions. They can happen weeks to months after starting the   medication. Contact your care team right away if you notice fevers or flu-like symptoms with a rash. The rash may be red or purple and then turn into blisters or  peeling of the skin. Or, you might notice a red rash with swelling of the face, lips or lymph nodes in your neck or under yourarms. Wear a medical identification bracelet or chain if you are taking thismedication for seizures. Carry a card that lists all your medications. You may get drowsy, dizzy, or have blurred vision. Do not drive, use machinery, or do anything that needs mental alertness until you know how this medication affects you. To reduce dizzy or fainting spells, do not sit or stand up quickly, especially if you are an older patient. Alcohol can increasedrowsiness and dizziness. Your mouth may get dry. Chewing sugarless gum or sucking hard candy, anddrinking plenty of water may help. Watch for new or worsening thoughts of suicide or depression. This includes sudden changes in mood, behaviors, or thoughts. These changes can happen at any time but are more common in the beginning of treatment or after a change in dose. Call your care team right away if you experience these thoughts orworsening depression. If you become pregnant while using this medication, you may enroll in the [place] American Antiepileptic Drug Pregnancy Registry by calling 1-888-233-2334. This registry collects information about the safety of antiepileptic medication useduring pregnancy. What side effects may I notice from receiving this medication? Side effects that you should report to your care team as soon as possible: Allergic reactions or angioedema-skin rash, itching, hives, swelling of the face, eyes, lips, tongue, arms, or legs, trouble swallowing or breathing Rash, fever, and swollen lymph nodes Thoughts of suicide or self harm, worsening mood, feelings of depression Trouble breathing Unusual changes in mood or behavior in children after use such as difficulty concentrating, hostility, or restlessness Side effects that usually do not require medical attention (report to your careteam if they continue or are  bothersome): Dizziness Drowsiness Nausea Swelling of ankles, feet, or hands Vomiting This list may not describe all possible side effects. Call your doctor for medical advice about side effects. You may report side effects to FDA at1-800-FDA-1088. Where should I keep my medication? Keep out of reach of children and pets. Store at room temperature between 15 and 30 degrees C (59 and 86 degrees F).Get rid of any unused medication after the expiration date. This medication may cause accidental overdose and death if taken by other adults, children, or pets. Mix any unused medication with a substance like cat litter or coffee grounds. Then throw the medication away in a sealed containerlike a sealed bag or a coffee can with a lid. NOTE: This sheet is a summary. It may not cover all possible information. If you have questions about this medicine, talk to your doctor, pharmacist, orhealth care provider.  2022 Elsevier/Gold Standard (2019-12-12 12:16:18)  

## 2020-08-20 ENCOUNTER — Ambulatory Visit: Payer: BC Managed Care – PPO | Admitting: Internal Medicine

## 2020-08-27 ENCOUNTER — Encounter: Payer: Self-pay | Admitting: Internal Medicine

## 2020-08-27 ENCOUNTER — Inpatient Hospital Stay: Payer: BC Managed Care – PPO

## 2020-08-27 DIAGNOSIS — Z28310 Unvaccinated for covid-19: Secondary | ICD-10-CM | POA: Diagnosis not present

## 2020-08-27 DIAGNOSIS — J431 Panlobular emphysema: Secondary | ICD-10-CM | POA: Diagnosis not present

## 2020-08-27 DIAGNOSIS — C257 Malignant neoplasm of other parts of pancreas: Secondary | ICD-10-CM | POA: Diagnosis not present

## 2020-08-27 DIAGNOSIS — Z87891 Personal history of nicotine dependence: Secondary | ICD-10-CM | POA: Diagnosis not present

## 2020-08-27 DIAGNOSIS — C259 Malignant neoplasm of pancreas, unspecified: Secondary | ICD-10-CM | POA: Diagnosis not present

## 2020-08-27 DIAGNOSIS — Z888 Allergy status to other drugs, medicaments and biological substances status: Secondary | ICD-10-CM | POA: Diagnosis not present

## 2020-08-27 DIAGNOSIS — Z9049 Acquired absence of other specified parts of digestive tract: Secondary | ICD-10-CM | POA: Diagnosis not present

## 2020-08-27 DIAGNOSIS — Z881 Allergy status to other antibiotic agents status: Secondary | ICD-10-CM | POA: Diagnosis not present

## 2020-08-27 DIAGNOSIS — K8689 Other specified diseases of pancreas: Secondary | ICD-10-CM | POA: Diagnosis not present

## 2020-08-27 DIAGNOSIS — R933 Abnormal findings on diagnostic imaging of other parts of digestive tract: Secondary | ICD-10-CM | POA: Diagnosis not present

## 2020-08-27 DIAGNOSIS — R978 Other abnormal tumor markers: Secondary | ICD-10-CM | POA: Diagnosis not present

## 2020-08-27 DIAGNOSIS — K219 Gastro-esophageal reflux disease without esophagitis: Secondary | ICD-10-CM | POA: Diagnosis not present

## 2020-08-27 DIAGNOSIS — K869 Disease of pancreas, unspecified: Secondary | ICD-10-CM | POA: Diagnosis not present

## 2020-08-27 DIAGNOSIS — Z882 Allergy status to sulfonamides status: Secondary | ICD-10-CM | POA: Diagnosis not present

## 2020-08-27 DIAGNOSIS — Z79899 Other long term (current) drug therapy: Secondary | ICD-10-CM | POA: Diagnosis not present

## 2020-08-27 DIAGNOSIS — K2289 Other specified disease of esophagus: Secondary | ICD-10-CM | POA: Diagnosis not present

## 2020-08-27 DIAGNOSIS — Z885 Allergy status to narcotic agent status: Secondary | ICD-10-CM | POA: Diagnosis not present

## 2020-08-28 ENCOUNTER — Telehealth: Payer: Self-pay

## 2020-08-28 NOTE — Telephone Encounter (Addendum)
Received call from Dr. Mont Dutton with EUS results. Communicated with Dr. Janese Banks, and as requested, referral sent to Dr. Hyman Hopes at [facility] for consult. Called and made Ms. Mcentee aware of referral. She will also come see Dr. Janese Banks on Monday for pathology results.

## 2020-08-28 NOTE — Progress Notes (Deleted)
Error

## 2020-09-01 ENCOUNTER — Encounter: Payer: Self-pay | Admitting: Oncology

## 2020-09-01 ENCOUNTER — Other Ambulatory Visit: Payer: Self-pay

## 2020-09-01 ENCOUNTER — Inpatient Hospital Stay: Payer: BC Managed Care – PPO | Admitting: Oncology

## 2020-09-01 VITALS — BP 118/46 | HR 86 | Temp 96.9°F | Resp 14 | Wt 123.5 lb

## 2020-09-01 DIAGNOSIS — M4316 Spondylolisthesis, lumbar region: Secondary | ICD-10-CM | POA: Diagnosis not present

## 2020-09-01 DIAGNOSIS — Z7189 Other specified counseling: Secondary | ICD-10-CM | POA: Diagnosis not present

## 2020-09-01 DIAGNOSIS — R109 Unspecified abdominal pain: Secondary | ICD-10-CM | POA: Diagnosis not present

## 2020-09-01 DIAGNOSIS — N281 Cyst of kidney, acquired: Secondary | ICD-10-CM | POA: Diagnosis not present

## 2020-09-01 DIAGNOSIS — C259 Malignant neoplasm of pancreas, unspecified: Secondary | ICD-10-CM

## 2020-09-01 DIAGNOSIS — M4317 Spondylolisthesis, lumbosacral region: Secondary | ICD-10-CM | POA: Diagnosis not present

## 2020-09-01 DIAGNOSIS — R1900 Intra-abdominal and pelvic swelling, mass and lump, unspecified site: Secondary | ICD-10-CM | POA: Diagnosis not present

## 2020-09-01 DIAGNOSIS — F603 Borderline personality disorder: Secondary | ICD-10-CM | POA: Diagnosis not present

## 2020-09-01 DIAGNOSIS — F319 Bipolar disorder, unspecified: Secondary | ICD-10-CM | POA: Diagnosis not present

## 2020-09-01 DIAGNOSIS — K769 Liver disease, unspecified: Secondary | ICD-10-CM | POA: Diagnosis not present

## 2020-09-01 DIAGNOSIS — C253 Malignant neoplasm of pancreatic duct: Secondary | ICD-10-CM | POA: Diagnosis not present

## 2020-09-01 DIAGNOSIS — J431 Panlobular emphysema: Secondary | ICD-10-CM | POA: Diagnosis not present

## 2020-09-01 DIAGNOSIS — G8929 Other chronic pain: Secondary | ICD-10-CM | POA: Diagnosis not present

## 2020-09-01 DIAGNOSIS — E875 Hyperkalemia: Secondary | ICD-10-CM | POA: Diagnosis not present

## 2020-09-01 DIAGNOSIS — M545 Low back pain, unspecified: Secondary | ICD-10-CM | POA: Diagnosis not present

## 2020-09-01 DIAGNOSIS — I700 Atherosclerosis of aorta: Secondary | ICD-10-CM | POA: Diagnosis not present

## 2020-09-01 DIAGNOSIS — R1013 Epigastric pain: Secondary | ICD-10-CM | POA: Diagnosis not present

## 2020-09-01 DIAGNOSIS — K869 Disease of pancreas, unspecified: Secondary | ICD-10-CM | POA: Diagnosis not present

## 2020-09-01 MED ORDER — TRAMADOL HCL 50 MG PO TABS: 50.0000 mg | ORAL_TABLET | Freq: Four times a day (QID) | ORAL | 0 refills | Status: DC | PRN

## 2020-09-01 NOTE — Progress Notes (Signed)
pt states she is feeling uncomfortable since her biopsy. Her PCP prescribed tramadol for pain, will need a refill. Will like to know if her results are back, and what the next steps will be?

## 2020-09-01 NOTE — Progress Notes (Signed)
Hematology/Oncology Consult note [facility]  [phone] [phone]  Patient Care Team: Cletis Athens, MD as PCP - General (Internal Medicine) Grant Fontana, [place] as Referring Physician (Chiropractic Medicine) Christene Lye, MD (General Surgery)   Name of the patient: Sheryl Perez  XC:[id number]  15-Mar-1960   Date of visit: 09/01/20  Diagnosis-pancreatic neck adenocarcinoma stage I cT1 N0 M0  Chief complaint/ Reason for visit-discussed EUS and pathology results and further management  Heme/Onc history: patient is a 60 year old female with a history of irritable bowel disease for which she follows up with [facility].  She has undergone EGD and colonoscopy with them in the past.  More recently patient was complaining of left upper quadrant and epigastric abdominal pain which causes sudden episodes of abdominal spasms and was prescribed as needed tramadol for it.  These episodes of pain are unrelated to food and are dull aching or throbbing.  She has undergone cholecystectomy in the past.  She underwent CT abdomen and pelvis with contrast in the ER on 08/04/2020 Which did not reveal any acute pathology which showed a possible hypoechoic 1.9 cm right hepatic lobe lesion.  This was followed by an MRI which showed no discrete lesion in the liver and the area of concern noted on CT scan was more likely to represent variable fat deposition.  However there was loss of normal T1 signal in the peripheral pancreas and a potential small pancreatic lesion suspicious for early pancreatic adenocarcinoma in the neck of the pancreas.    Patient underwent EUS by Dr. Mont Dutton which showed an irregular mass in the pancreatic neck measuring 15 x 11 mm.  FNA showed adenocarcinoma.  Pancreatic duct measured 1.1 mm in the head with abrupt dilatation in the region of the mass to 4.1 mm.  No abnormality noted in the common bile duct and hepatic duct.  Celiac region showed no  significant endosonographic abnormality.  No lymphadenopathy was seen.  CA 19-9 mildly elevated at 35  Interval history-patient reports some epigastric pain especially after her procedure and is currently using tramadol about 4 times a day.  Denies other complaints at this time.  Tramadol is working well for her pain  ECOG PS- 1 Pain scale- 0   Review of systems- Review of Systems  Constitutional:  Negative for chills, fever, malaise/fatigue and weight loss.  HENT:  Negative for congestion, ear discharge and nosebleeds.   Eyes:  Negative for blurred vision.  Respiratory:  Negative for cough, hemoptysis, sputum production, shortness of breath and wheezing.   Cardiovascular:  Negative for chest pain, palpitations, orthopnea and claudication.  Gastrointestinal:  Positive for abdominal pain. Negative for blood in stool, constipation, diarrhea, heartburn, melena, nausea and vomiting.  Genitourinary:  Negative for dysuria, flank pain, frequency, hematuria and urgency.  Musculoskeletal:  Negative for back pain, joint pain and myalgias.  Skin:  Negative for rash.  Neurological:  Negative for dizziness, tingling, focal weakness, seizures, weakness and headaches.  Endo/Heme/Allergies:  Does not bruise/bleed easily.  Psychiatric/Behavioral:  Negative for depression and suicidal ideas. The patient does not have insomnia.       Allergies  Allergen Reactions   Sulfacetamide Sodium Swelling    Diarrhea,dizziness, tongue swelling   Other Other (See Comments) and Swelling    PT states that anything with the sides effects "may cause rash" she cannot take due to her psoriasis, it will make the patches on her hand so thick she can't use her hands.  Diarrhea,dizziness,  tongue swelling   Oxycontin [Oxycodone Hcl] Nausea And Vomiting   Sulfa Antibiotics Swelling    Diarrhea,dizziness, tongue swelling   Abilify [Aripiprazole] Other (See Comments)   Cephalosporins Rash   Erythromycin Other (See Comments)     Abd. pain Abd. pain Other reaction(s): Other (See Comments) Abd. 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    Bipolar disorder (HCC)    Chest pain, unspecified    Chronic low back pain 10/23/2013   Chronic UTI    COPD (chronic obstructive pulmonary disease) (HCC)    Depression    Dysfunction of eustachian tube    Enlargement of lymph nodes    History of Bell's palsy Right   Injury, other and unspecified, knee, leg, ankle, and foot    MRSA (methicillin resistant staph aureus) culture positive 07-11-14   abscess   Other specified disease of hair and hair follicles    Rectal prolapse    Unspecified symptom associated with female genital organs      Past Surgical History:  Procedure Laterality Date   ABDOMINAL HYSTERECTOMY     bladder tack  2010   CHOLECYSTECTOMY  2002   FOOT SURGERY Bilateral 2004   bunion   GANGLION CYST EXCISION  2002   INCISION AND DRAINAGE ABSCESS  04-16-15   PARTIAL HYSTERECTOMY  2010   uterus removed    Social History   Socioeconomic History   Marital status: Married    Spouse name: Not on file   Number of children: 3   Years of education: hs   Highest education level: Some college, no degree  Occupational History   Not on file  Tobacco Use   Smoking status: Former    Packs/day: 0.50    Years: 40.00    Pack years: 20.00    Types: Cigarettes    Quit date: 07/14/2020    Years since quitting: 0.1   Smokeless tobacco: Never  Vaping Use   Vaping Use: Never used  Substance and Sexual Activity   Alcohol use: No    Alcohol/week: 1.0 standard drink    Types: 1 Glasses of wine per week    Comment: occasional   Drug use: Yes    Types: Marijuana    Comment: last used 05-20-15   Sexual activity: Yes    Birth  control/protection: None  Other Topics Concern   Not on file  Social History Narrative   Patient lives at home with her husband [name]).   Patient works full time.   Education CNA    Right handed.   Caffeine Tea , Soda.   Social Determinants of Health   Financial Resource Strain: Not on file  Food Insecurity: Not on file  Transportation Needs: Not on file  Physical Activity: Not on file  Stress: Not on file  Social Connections: Not on file  Intimate Partner Violence: Not on file    Family History  Problem Relation Age of Onset   Parkinson's disease Maternal Grandfather    Diabetes Father    Cancer Father        brain tumor   Depression Father    Diabetes Paternal Grandfather    Colon polyps Mother    Heart disease Maternal Grandmother  Heart attack Maternal Grandmother    Stroke Maternal Grandmother    Depression Sister    Alcohol abuse Brother    Depression Brother    Breast cancer Paternal Aunt    Breast cancer Cousin      Current Outpatient Medications:    ADVAIR DISKUS 250-50 MCG/DOSE AEPB, , Disp: , Rfl:    ALPRAZolam (XANAX) 1 MG tablet, Take 1 tablet (1 mg total) by mouth 2 (two) times daily as needed for anxiety., Disp: 20 tablet, Rfl: 0   Ascorbic Acid (VITAMIN C) 1000 MG tablet, Take 1,000 mg by mouth daily., Disp: , Rfl:    ferrous sulfate 325 (65 FE) MG tablet, Take by mouth., Disp: , Rfl:    fluticasone (FLONASE) 50 MCG/ACT nasal spray, Place 1 spray into both nostrils daily., Disp: 11.1 mL, Rfl: 2   gabapentin (NEURONTIN) 100 MG capsule, Take 1 capsule (100 mg total) by mouth daily with supper., Disp: 30 capsule, Rfl: 1   loratadine (CLARITIN) 10 MG tablet, TAKE 1 TABLET BY MOUTH EVERY DAY, Disp: 90 tablet, Rfl: 1   Loratadine 10 MG CAPS, Take by mouth., Disp: , Rfl:    Multiple Vitamin (MULTIVITAMIN) tablet, Take 1 tablet by mouth daily., Disp: , Rfl:    naproxen sodium (ALEVE) 220 MG tablet, Take by mouth., Disp: , Rfl:    propranolol (INDERAL)  20 MG tablet, Take 1 tablet (20 mg total) by mouth 3 (three) times daily., Disp: 270 tablet, Rfl: 1   QUEtiapine (SEROQUEL) 50 MG tablet, Take 1 tablet (50 mg total) by mouth at bedtime as needed., Disp: 90 tablet, Rfl: 0   albuterol (VENTOLIN HFA) 108 (90 Base) MCG/ACT inhaler, Inhale 2 puffs into the lungs every 4 (four) hours as needed for wheezing or shortness of breath. (Patient not taking: Reported on 09/01/2020), Disp: 8 g, Rfl: 4   ALPRAZolam (XANAX) 0.25 MG tablet, Take by mouth. (Patient not taking: Reported on 09/01/2020), Disp: , Rfl:    ALPRAZolam (XANAX) 0.5 MG tablet, Take 1 tablet (0.5 mg total) by mouth 2 (two) times daily as needed for anxiety. (Patient not taking: No sig reported), Disp: 20 tablet, Rfl: 1   cyclobenzaprine (FLEXERIL) 5 MG tablet, Take 5 mg by mouth 2 (two) times daily at 10 AM and 5 PM.  (Patient not taking: No sig reported), Disp: , Rfl:    famotidine (PEPCID) 20 MG tablet, Take by mouth. (Patient not taking: No sig reported), Disp: , Rfl:    hydrOXYzine (ATARAX/VISTARIL) 10 MG tablet, TAKE 1 TABLET (10 MG TOTAL) BY MOUTH 2 (TWO) TIMES DAILY AS NEEDED. FOR SEVERE ANXIETY ATTACKS ONLY (Patient not taking: Reported on 09/01/2020), Disp: 180 tablet, Rfl: 1   KLOR-CON M20 20 MEQ tablet, Take 20 mEq by mouth 2 (two) times daily.  (Patient not taking: No sig reported), Disp: , Rfl: 4   megestrol (MEGACE) 400 MG/10ML suspension, Take 10 mLs (400 mg total) by mouth daily. (Patient not taking: No sig reported), Disp: 240 mL, Rfl: 0   methylPREDNISolone (MEDROL DOSEPAK) 4 MG TBPK tablet, Use as directed (Patient not taking: No sig reported), Disp: 21 tablet, Rfl: 0   ondansetron (ZOFRAN-ODT) 4 MG disintegrating tablet, , Disp: , Rfl:    pantoprazole (PROTONIX) 40 MG tablet, , Disp: , Rfl:    Potassium Chloride ER 20 MEQ TBCR, , Disp: , Rfl:    traMADol (ULTRAM) 50 MG tablet, Take 1 tablet (50 mg total) by mouth every 6 (six) hours as needed., Disp: 120 tablet,  Rfl: 0    triamcinolone cream (KENALOG) 0.1 %, Apply 1 application topically 2 (two) times daily. (Patient not taking: No sig reported), Disp: 30 g, Rfl: 0  Physical exam:  Vitals:   09/01/20 1407  BP: (!) 118/46  Pulse: 86  Resp: 14  Temp: (!) 96.9 F (36.1 C)  SpO2: 99%  Weight: 123 lb 8 oz (56 kg)   Physical Exam Constitutional:      General: She is not in acute distress. Cardiovascular:     Rate and Rhythm: Normal rate and regular rhythm.     Heart sounds: Normal heart sounds.  Pulmonary:     Effort: Pulmonary effort is normal.     Breath sounds: Normal breath sounds.  Abdominal:     General: Bowel sounds are normal.     Palpations: Abdomen is soft.  Skin:    General: Skin is warm and dry.  Neurological:     Mental Status: She is alert and oriented to person, place, and time.     CMP Latest Ref Rng & Units 08/04/2020  Glucose 70 - 99 mg/dL 98  BUN 6 - 20 mg/dL 15  Creatinine 0.44 - 1.00 mg/dL 0.93  Sodium 135 - 145 mmol/L 140  Potassium 3.5 - 5.1 mmol/L 4.0  Chloride 98 - 111 mmol/L 106  CO2 22 - 32 mmol/L 29  Calcium 8.9 - 10.3 mg/dL 9.2  Total Protein 6.5 - 8.1 g/dL 7.4  Total Bilirubin 0.3 - 1.2 mg/dL 0.5  Alkaline Phos 38 - 126 U/L 60  AST 15 - 41 U/L 20  ALT 0 - 44 U/L 16   CBC Latest Ref Rng & Units 08/04/2020  WBC 4.0 - 10.5 K/uL 7.6  Hemoglobin 12.0 - 15.0 g/dL 11.5(L)  Hematocrit 36.0 - 46.0 % 33.4(L)  Platelets 150 - 400 K/uL 324    No images are attached to the encounter.  MR Abdomen W Wo Contrast  Result Date: 08/07/2020 CLINICAL DATA:  Abdominal mass, intra-abdominal mass, 19 mm lesion discovered on recent CT evaluation. EXAM: MRI ABDOMEN WITHOUT AND WITH CONTRAST TECHNIQUE: Multiplanar multisequence MR imaging of the abdomen was performed both before and after the administration of intravenous contrast. CONTRAST:  68m GADAVIST GADOBUTROL 1 MMOL/ML IV SOLN COMPARISON:  CT evaluation from August 04, 2020. FINDINGS: Lower chest: Incidental imaging of the lung  bases without effusion or sign of consolidative change. Hepatobiliary: Minimal signal loss in the area of concern along the gallbladder fossa, RIGHT hepatic margin. No discrete, visible lesion on noncontrast or contrasted imaging aside from very subtle signal loss in this area. No biliary duct dilation. Pancreas: Loss of normal T1 signal in the distal, peripheral pancreas beginning at the neck of the pancreas. Pancreatic duct is mildly dilated at the site of transition and into the peripheral pancreas. No signs of pancreatic inflammation. Potential lesion in the pancreatic neck based on this appearance, not visualized as a discrete lesion on the current study.w ductal transition best demonstrated on single shot T2 images on images 12 and 13 in the pancreatic neck of series 10. This also corresponds to transition of normal an abnormal pancreatic signal. Postcontrast image 29 of series 12 shows more rounded appearance of signal at the transition hypointense to both peripheral dorsal pancreas and ventral pancreas. This becomes more isointense on later phases to the remainder of the pancreas. Spleen:  Normal. Adrenals/Urinary Tract:  Adrenal glands are normal. Symmetric renal enhancement without hydronephrosis. Renal cysts largest on the LEFT in the upper pole  measuring 3.0 x 2.2 cm. Stomach/Bowel: No acute gastrointestinal process to the extent evaluated. Vascular/Lymphatic: Patent abdominal vasculature. There is no gastrohepatic or hepatoduodenal ligament lymphadenopathy. No retroperitoneal or mesenteric lymphadenopathy. Other:  No ascites. Musculoskeletal: No suspicious bone lesions identified. IMPRESSION: 1. Ductal transition, loss of normal T1 signal in the peripheral pancreas and potential small pancreatic lesion suspicious for early pancreatic adenocarcinoma in the neck of the pancreas. Endoscopic assessment is suggested for further evaluation. 2. No signs of nodal disease. 3. Area of concern in the liver  represents variable fat deposition along the gallbladder fossa. 4. No evidence of biliary duct dilation. These results will be called to the ordering clinician or representative by the Radiologist Assistant, and communication documented in the PACS or [employer]. Electronically Signed   By: Zetta Bills M.D.   On: 08/07/2020 12:28   CT ABDOMEN PELVIS W CONTRAST  Result Date: 08/04/2020 CLINICAL DATA:  Left upper quadrant pain EXAM: CT ABDOMEN AND PELVIS WITH CONTRAST TECHNIQUE: Multidetector CT imaging of the abdomen and pelvis was performed using the standard protocol following bolus administration of intravenous contrast. CONTRAST:  150m OMNIPAQUE IOHEXOL 300 MG/ML  SOLN COMPARISON:  Ultrasound 01/19/2016, FINDINGS: Lower chest: Lung bases demonstrate no acute consolidation or effusion. Normal cardiac size. Hepatobiliary: Status post cholecystectomy. No biliary dilatation. 19 mm hypoechoic lesion at the inferior right hepatic lobe, series 2, image 36. Pancreas: No inflammatory changes.  Prominent pancreatic duct. Spleen: Normal in size without focal abnormality. Adrenals/Urinary Tract: Adrenal glands are normal. Cysts in the left kidney. No hydronephrosis. The bladder is normal. Stomach/Bowel: Stomach is within normal limits. Appendix appears normal. No evidence of bowel wall thickening, distention, or inflammatory changes. Vascular/Lymphatic: Mild aortic atherosclerosis. No aneurysm. No suspicious nodes Reproductive: Status post hysterectomy. No adnexal masses. Other: Negative for free air or free fluid. Musculoskeletal: Grade 1 anterolisthesis L4 on L5 and L5 on S1. IMPRESSION: 1. No CT evidence for acute intra-abdominal or pelvic abnormality. 2. 19 mm hypoechoic lesion inferior right hepatic lobe indeterminate for focus of fat infiltration or hypodense mass. When the patient is clinically stable and able to follow directions and hold their breath (preferably as an outpatient) further evaluation  with dedicated abdominal MRI should be considered. Electronically Signed   By: KDonavan FoilM.D.   On: 08/04/2020 15:57     Assessment and plan- Patient is a 60y.o. female with newly diagnosed pancreatic neck adenocarcinoma stage I cT1 N0 M0 here to discuss further management  Discussed the findings of the EUS which showed mass in the pancreatic neck without any evidence of locoregional adenopathy or celiac plexus involvement.  Biopsy was consistent with adenocarcinoma.  This is therefore stage I T1 N0 M0 pancreatic adenocarcinoma and based on MRI findings this appears to be upfront resectable.  [facility] this [facility] to discuss surgical options.  Discussed with the patient that pancreatic cancers are generally aggressive and even with stage I pancreatic cancer chemotherapy is recommended.  5-year overall survival with chemotherapy adjuvantly and stage I pancreatic cancer is about 50% and without chemotherapy about 28%.  Neoadjuvant chemotherapy is also a consideration to control micrometastatic disease.  However I feel that this would unnecessarily delay surgery and potential decline in her performance status due to side effects from chemotherapy.  I will therefore await opinion from Dr. NHyman Hopesat this time but would favor proceeding with upfront surgery and then see her following that to discuss adjuvant chemotherapy which  would be typically given for 6 months.  Dr. Ardell Isaacs will also be getting a CT pancreatic mass protocol as well as a CT chest at [facility] prior to surgery. Treatment will be given with a curatve intent  Neoplasm related: I will renew her tramadol today  Cancer Staging Pancreatic adenocarcinoma [facility]) Staging form: Exocrine Pancreas, AJCC 8th Edition - Clinical stage from 09/01/2020: Stage IA (cT1, cN0, cM0) - Signed by Sindy Guadeloupe, MD on 09/01/2020 Total positive nodes: 0    Visit Diagnosis 1. Pancreatic  adenocarcinoma ([place])   2. Goals of care, counseling/discussion      Dr. Randa Evens, MD, MPH [facility] at [facility] [id number] 09/01/2020 4:21 PM

## 2020-09-05 DIAGNOSIS — K8689 Other specified diseases of pancreas: Secondary | ICD-10-CM | POA: Diagnosis not present

## 2020-09-05 DIAGNOSIS — C259 Malignant neoplasm of pancreas, unspecified: Secondary | ICD-10-CM | POA: Diagnosis not present

## 2020-09-08 ENCOUNTER — Encounter: Payer: Self-pay | Admitting: Oncology

## 2020-09-08 ENCOUNTER — Encounter: Payer: Self-pay | Admitting: Psychiatry

## 2020-09-08 ENCOUNTER — Inpatient Hospital Stay: Payer: BC Managed Care – PPO | Attending: Oncology | Admitting: Oncology

## 2020-09-08 ENCOUNTER — Telehealth (INDEPENDENT_AMBULATORY_CARE_PROVIDER_SITE_OTHER): Payer: BC Managed Care – PPO | Admitting: Psychiatry

## 2020-09-08 ENCOUNTER — Other Ambulatory Visit: Payer: BC Managed Care – PPO

## 2020-09-08 ENCOUNTER — Other Ambulatory Visit: Payer: Self-pay

## 2020-09-08 VITALS — BP 129/54 | HR 77 | Temp 97.0°F | Resp 20 | Wt 118.6 lb

## 2020-09-08 DIAGNOSIS — Z87891 Personal history of nicotine dependence: Secondary | ICD-10-CM | POA: Diagnosis not present

## 2020-09-08 DIAGNOSIS — F17200 Nicotine dependence, unspecified, uncomplicated: Secondary | ICD-10-CM | POA: Diagnosis not present

## 2020-09-08 DIAGNOSIS — Z808 Family history of malignant neoplasm of other organs or systems: Secondary | ICD-10-CM | POA: Insufficient documentation

## 2020-09-08 DIAGNOSIS — Z79899 Other long term (current) drug therapy: Secondary | ICD-10-CM

## 2020-09-08 DIAGNOSIS — Z803 Family history of malignant neoplasm of breast: Secondary | ICD-10-CM | POA: Diagnosis not present

## 2020-09-08 DIAGNOSIS — Z833 Family history of diabetes mellitus: Secondary | ICD-10-CM | POA: Diagnosis not present

## 2020-09-08 DIAGNOSIS — Z8269 Family history of other diseases of the musculoskeletal system and connective tissue: Secondary | ICD-10-CM | POA: Diagnosis not present

## 2020-09-08 DIAGNOSIS — Z823 Family history of stroke: Secondary | ICD-10-CM | POA: Diagnosis not present

## 2020-09-08 DIAGNOSIS — Z5111 Encounter for antineoplastic chemotherapy: Secondary | ICD-10-CM | POA: Insufficient documentation

## 2020-09-08 DIAGNOSIS — Z818 Family history of other mental and behavioral disorders: Secondary | ICD-10-CM | POA: Diagnosis not present

## 2020-09-08 DIAGNOSIS — R1013 Epigastric pain: Secondary | ICD-10-CM | POA: Diagnosis not present

## 2020-09-08 DIAGNOSIS — C259 Malignant neoplasm of pancreas, unspecified: Secondary | ICD-10-CM

## 2020-09-08 DIAGNOSIS — C257 Malignant neoplasm of other parts of pancreas: Secondary | ICD-10-CM | POA: Insufficient documentation

## 2020-09-08 DIAGNOSIS — F3178 Bipolar disorder, in full remission, most recent episode mixed: Secondary | ICD-10-CM

## 2020-09-08 DIAGNOSIS — Z7189 Other specified counseling: Secondary | ICD-10-CM | POA: Diagnosis not present

## 2020-09-08 DIAGNOSIS — F1221 Cannabis dependence, in remission: Secondary | ICD-10-CM

## 2020-09-08 DIAGNOSIS — R5383 Other fatigue: Secondary | ICD-10-CM | POA: Insufficient documentation

## 2020-09-08 DIAGNOSIS — F411 Generalized anxiety disorder: Secondary | ICD-10-CM

## 2020-09-08 DIAGNOSIS — Z811 Family history of alcohol abuse and dependence: Secondary | ICD-10-CM | POA: Diagnosis not present

## 2020-09-08 DIAGNOSIS — F319 Bipolar disorder, unspecified: Secondary | ICD-10-CM | POA: Diagnosis not present

## 2020-09-08 DIAGNOSIS — F603 Borderline personality disorder: Secondary | ICD-10-CM | POA: Diagnosis not present

## 2020-09-08 MED ORDER — ONDANSETRON HCL 8 MG PO TABS: 8.0000 mg | ORAL_TABLET | Freq: Two times a day (BID) | ORAL | 1 refills | Status: DC | PRN

## 2020-09-08 MED ORDER — LOPERAMIDE HCL 2 MG PO TABS
ORAL_TABLET | ORAL | 1 refills | Status: AC
Start: 2020-09-08 — End: ?

## 2020-09-08 MED ORDER — DEXAMETHASONE 4 MG PO TABS: 8.0000 mg | ORAL_TABLET | Freq: Every day | ORAL | 5 refills | Status: DC

## 2020-09-08 MED ORDER — PROCHLORPERAZINE MALEATE 10 MG PO TABS: 10.0000 mg | ORAL_TABLET | Freq: Four times a day (QID) | ORAL | 1 refills | Status: DC | PRN

## 2020-09-08 MED ORDER — LIDOCAINE-PRILOCAINE 2.5-2.5 % EX CREA: TOPICAL_CREAM | CUTANEOUS | 3 refills | Status: DC

## 2020-09-08 NOTE — Progress Notes (Signed)
Virtual Visit via Video Note  I connected with Sheryl Perez on 09/08/20 at  4:20 PM EDT by a video enabled telemedicine application and verified that I am speaking with the correct person using two identifiers.  Location Provider Location : [facility] Patient Location : Home  Participants: Patient , Provider   I discussed the limitations of evaluation and management by telemedicine and the availability of in person appointments. The patient expressed understanding and agreed to proceed.    I discussed the assessment and treatment plan with the patient. The patient was provided an opportunity to ask questions and all were answered. The patient agreed with the plan and demonstrated an understanding of the instructions.   The patient was advised to call back or seek an in-person evaluation if the symptoms worsen or if the condition fails to improve as anticipated.    [place] MD OP Progress Note  09/08/2020 4:32 PM Sheryl Perez  MRN:  [id number]  Chief Complaint:  Chief Complaint   Follow-up; Anxiety    HPI: Sheryl Perez is a 60 year old Caucasian female who has a history of bipolar disorder, borderline personality disorder, cannabis use disorder in early remission, tobacco use disorder in early remission was evaluated by telemedicine today.  Patient recently underwent CT scan abdomen and pelvis.  She had an MRI which showed areas of concern, loss of normal T1 signal in the peripheral pancreas.  She underwent EUS by Dr.Burbridge, which showed an irregular mass in the pancreatic neck,FNA showed adenocarcinoma.  Patient diagnosed with pancreatic adenocarcinoma, currently following up with Dr. Janese Banks, oncology, scheduled to start chemotherapy.  Patient reports her cancer diagnosis does make her sad however she has been coping okay.  She is not interested in counseling or psychotherapy at this time.  She does have good social support system from her family.  Patient does report feeling anxious  often especially since being off of the Depakote.  She worries about her health and her current diagnosis.  She reports she is not tolerating the gabapentin well which replaced her Depakote.  It makes her groggy and drowsy.  She reports sleep is overall okay otherwise.  Denies any suicidality, homicidality or perceptual disturbances.  Patient denies any other concerns today.  Visit Diagnosis:    ICD-10-CM   1. Bipolar disorder, in full remission, most recent episode mixed ([place])  F31.78     2. GAD (generalized anxiety disorder)  F41.1     3. Tobacco use disorder  F17.200     4. Borderline personality disorder ([place])  F60.3     5. Cannabis use disorder, moderate, in early remission ([place])  F12.21     6. High risk medication use  Z79.899       Past Psychiatric History: Reviewed past psychiatric history from progress note on 04/12/2017.  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    Bipolar disorder (HCC)    Chest pain, unspecified    Chronic low back pain 10/23/2013   Chronic UTI    COPD (chronic obstructive pulmonary disease) (HCC)    Depression    Dysfunction of eustachian tube    Enlargement of lymph nodes    History of Bell's palsy Right   Injury, other and unspecified, knee, leg, ankle, and foot    MRSA (methicillin resistant staph aureus) culture positive 07-11-14  abscess   Other specified disease of hair and hair follicles    Rectal prolapse    Unspecified symptom associated with female genital organs     Past Surgical History:  Procedure Laterality Date   ABDOMINAL HYSTERECTOMY     bladder tack  2010   CHOLECYSTECTOMY  2002   FOOT SURGERY Bilateral 2004   bunion   GANGLION CYST EXCISION  2002   INCISION AND DRAINAGE ABSCESS  04-16-15   PARTIAL HYSTERECTOMY  2010   uterus removed    Family Psychiatric  History: Reviewed family psychiatric history from progress note on 04/12/2017  Family History:  Family History  Problem Relation Age of Onset   Parkinson's disease Maternal Grandfather    Diabetes Father    Cancer Father        brain tumor   Depression Father    Diabetes Paternal Grandfather    Colon polyps Mother    Heart disease Maternal Grandmother    Heart attack Maternal Grandmother    Stroke Maternal Grandmother    Depression Sister    Alcohol abuse Brother    Depression Brother    Breast cancer Paternal Aunt    Breast cancer Cousin     Social History: Reviewed social history from progress note on 04/12/2017 Social History   Socioeconomic History   Marital status: Married    Spouse name: Not on file   Number of children: 3   Years of education: hs   Highest education level: Some college, no degree  Occupational History   Not on file  Tobacco Use   Smoking status: Former    Packs/day: 0.50    Years: 40.00    Pack years: 20.00    Types: Cigarettes    Quit date: 07/14/2020    Years since quitting: 0.1   Smokeless tobacco: Never  Vaping Use   Vaping Use: Never used  Substance and Sexual Activity   Alcohol use: No    Alcohol/week: 1.0 standard drink    Types: 1 Glasses of wine per week    Comment: occasional   Drug use: Yes    Types: Marijuana    Comment: last used 05-20-15   Sexual activity: Yes    Birth control/protection: None  Other Topics Concern   Not on file  Social History Narrative   Patient lives at home with her husband Sheryl Perez).   Patient works full time.   Education CNA    Right handed.   Caffeine Tea , Soda.   Social Determinants of Health   Financial Resource Strain: Not on file  Food Insecurity: Not on file  Transportation Needs: Not on file  Physical Activity: Not on file  Stress: Not on file  Social Connections: Not on file    Allergies:  Allergies  Allergen Reactions   Sulfacetamide Sodium Swelling    Diarrhea,dizziness, tongue  swelling   Other Other (See Comments) and Swelling    PT states that anything with the sides effects "may cause rash" she cannot take due to her psoriasis, it will make the patches on her hand so thick she can't use her hands.  Diarrhea,dizziness, tongue swelling   Oxycontin [Oxycodone Hcl] Nausea And Vomiting   Sulfa Antibiotics Swelling    Diarrhea,dizziness, tongue swelling   Abilify [Aripiprazole] Other (See Comments)   Cephalosporins Rash   Erythromycin Other (See Comments)    Abd. pain Abd. pain Other reaction(s): Other (See Comments) Abd. pain   Lamotrigine Rash and Other (See Comments)  psorasis worsen psorasis worsen   Risperidone Other (See Comments)    tremors tremors tremors    Metabolic Disorder Labs: Lab Results  Component Value Date   HGBA1C 5.3 08/24/2018   MPG 105.41 08/24/2018   MPG 105.41 04/14/2017   Lab Results  Component Value Date   PROLACTIN 9.3 08/24/2018   PROLACTIN 12.5 04/14/2017   Lab Results  Component Value Date   CHOL 197 12/12/2019   TRIG 68 08/04/2020   HDL 51 12/12/2019   CHOLHDL 3.9 12/12/2019   VLDL 37 08/24/2018   LDLCALC 123 (H) 12/12/2019   LDLCALC 107 (H) 08/24/2018   Lab Results  Component Value Date   TSH 1.27 12/12/2019   TSH 2.376 04/14/2017    Therapeutic Level Labs: Lab Results  Component Value Date   LITHIUM 0.34 (L) 04/14/2017   LITHIUM 0.3 (L) 06/15/2016   Lab Results  Component Value Date   VALPROATE 17 (L) 02/22/2020   VALPROATE 31 (L) 10/05/2019   No components found for:  CBMZ  Current Medications: Current Outpatient Medications  Medication Sig Dispense Refill   ADVAIR DISKUS 250-50 MCG/DOSE AEPB      albuterol (VENTOLIN HFA) 108 (90 Base) MCG/ACT inhaler Inhale 2 puffs into the lungs every 4 (four) hours as needed for wheezing or shortness of breath. (Patient not taking: No sig reported) 8 g 4   albuterol (VENTOLIN) (5 MG/ML) 0.5% NEBU Inhale into the lungs.     ALPRAZolam (XANAX) 0.25 MG  tablet Take by mouth. (Patient not taking: No sig reported)     ALPRAZolam (XANAX) 0.5 MG tablet Take 1 tablet (0.5 mg total) by mouth 2 (two) times daily as needed for anxiety. (Patient not taking: No sig reported) 20 tablet 1   ALPRAZolam (XANAX) 1 MG tablet Take 1 tablet (1 mg total) by mouth 2 (two) times daily as needed for anxiety. 20 tablet 0   Ascorbic Acid (VITAMIN C) 1000 MG tablet Take 1,000 mg by mouth daily.     cyclobenzaprine (FLEXERIL) 5 MG tablet Take 5 mg by mouth 2 (two) times daily at 10 AM and 5 PM.  (Patient not taking: No sig reported)     dexamethasone (DECADRON) 4 MG tablet Take 2 tablets (8 mg total) by mouth daily. Start the day after chemotherapy for 3 days. Take with food. 8 tablet 5   famotidine (PEPCID) 20 MG tablet Take by mouth. (Patient not taking: No sig reported)     ferrous sulfate 325 (65 FE) MG tablet Take by mouth.     fluticasone (FLONASE) 50 MCG/ACT nasal spray Place 1 spray into both nostrils daily. 11.1 mL 2   hydrOXYzine (ATARAX/VISTARIL) 10 MG tablet TAKE 1 TABLET (10 MG TOTAL) BY MOUTH 2 (TWO) TIMES DAILY AS NEEDED. FOR SEVERE ANXIETY ATTACKS ONLY (Patient not taking: No sig reported) 180 tablet 1   KLOR-CON M20 20 MEQ tablet Take 20 mEq by mouth 2 (two) times daily.  (Patient not taking: No sig reported)  4   lidocaine-prilocaine (EMLA) cream Apply to affected area once 30 g 3   loperamide (IMODIUM A-D) 2 MG tablet Take 2 at onset of diarrhea, then 1 every 2hrs until 12hr without a BM. May take 2 tab every 4hrs at bedtime. If diarrhea recurs repeat. 100 tablet 1   loratadine (CLARITIN) 10 MG tablet TAKE 1 TABLET BY MOUTH EVERY DAY 90 tablet 1   Loratadine 10 MG CAPS Take by mouth.     megestrol (MEGACE) 400 MG/10ML suspension Take  10 mLs (400 mg total) by mouth daily. (Patient not taking: No sig reported) 240 mL 0   methylPREDNISolone (MEDROL DOSEPAK) 4 MG TBPK tablet Use as directed (Patient not taking: No sig reported) 21 tablet 0   Multiple Vitamin  (MULTIVITAMIN) tablet Take 1 tablet by mouth daily.     naproxen sodium (ALEVE) 220 MG tablet Take by mouth.     ondansetron (ZOFRAN) 8 MG tablet Take 1 tablet (8 mg total) by mouth 2 (two) times daily as needed. Start on day 3 after chemotherapy. 30 tablet 1   ondansetron (ZOFRAN-ODT) 4 MG disintegrating tablet  (Patient not taking: No sig reported)     pantoprazole (PROTONIX) 40 MG tablet  (Patient not taking: No sig reported)     Potassium Chloride ER 20 MEQ TBCR  (Patient not taking: No sig reported)     prochlorperazine (COMPAZINE) 10 MG tablet Take 1 tablet (10 mg total) by mouth every 6 (six) hours as needed (Nausea or vomiting). 30 tablet 1   propranolol (INDERAL) 20 MG tablet Take 1 tablet (20 mg total) by mouth 3 (three) times daily. 270 tablet 1   QUEtiapine (SEROQUEL) 50 MG tablet Take 1 tablet (50 mg total) by mouth at bedtime as needed. 90 tablet 0   traMADol (ULTRAM) 50 MG tablet Take 1 tablet (50 mg total) by mouth every 6 (six) hours as needed. 120 tablet 0   triamcinolone cream (KENALOG) 0.1 % Apply 1 application topically 2 (two) times daily. (Patient not taking: No sig reported) 30 g 0   No current facility-administered medications for this visit.     Musculoskeletal: Strength & Muscle Tone:  UTA Gait & Station:  UTA Patient leans: N/A  Psychiatric Specialty Exam: Review of Systems  Gastrointestinal:  Positive for abdominal pain.  Psychiatric/Behavioral:  The patient is nervous/anxious.   All other systems reviewed and are negative.  Last menstrual period 02/09/2008.There is no height or weight on file to calculate BMI.  General Appearance: Casual  Eye Contact:  Fair  Speech:  Clear and Coherent  Volume:  Normal  Mood:  Anxious  Affect:  Congruent  Thought Process:  Goal Directed and Descriptions of Associations: Intact  Orientation:  Full (Time, Place, and Person)  Thought Content: Logical   Suicidal Thoughts:  No  Homicidal Thoughts:  No  Memory:   Immediate;   Fair Recent;   Fair Remote;   Fair  Judgement:  Fair  Insight:  Fair  Psychomotor Activity:  Normal  Concentration:  Concentration: Fair and Attention Span: Fair  Recall:  [employer] of Knowledge: Fair  Language: Fair  Akathisia:  No  Handed:  Right  AIMS (if indicated): not done  Assets:  Communication Skills Desire for Improvement Housing Social Support Talents/Skills  ADL's:  Intact  Cognition: WNL  Sleep:  Fair   Screenings: AIMS    Flowsheet Row Video Visit from 04/21/2020 in [facility] Office Visit from 05/03/2016 in [facility] Admission (Discharged) from 01/15/2015 in [facility] Total Score 0 0 0      AUDIT    Flowsheet Row Admission (Discharged) from 01/15/2015 in [facility]  Alcohol Use Disorder Identification Test Final Score (AUDIT) 1      GAD-7    Flowsheet Row Video Visit from 09/08/2020 in [facility] Office Visit from 08/19/2020 in [facility] Office Visit from [facility]/18/2015 in [facility]  Total GAD-7  Score '6 2 14      '$ PHQ2-9    Flowsheet Row Video Visit from 09/08/2020 in [facility] Office Visit from 08/19/2020 in [facility] Video Visit from 04/21/2020 in [facility] Office Visit from 12/14/2019 in [facility] Office Visit from 04/18/2015 in [facility]  PHQ-2 Total Score 2 0 0 0 4  PHQ-9 Total Score 5 0 -- -- 14      Flowsheet Row Video Visit from 09/08/2020 in [facility] Office Visit from 08/19/2020 in [facility] ED from 08/04/2020 in [facility] CATEGORY No Risk No Risk No Risk        Assessment and Plan: Sheryl Perez is a  60 year old Caucasian female who has a history of bipolar disorder, cannabis use disorder, tobacco use disorder, psoriatic arthritis was evaluated by telemedicine today.  Patient with recent diagnosis of pancreatic adenocarcinoma, currently following up with oncology.  She is not tolerating the gabapentin well.  Discussed plan as noted below.  Plan  Bipolar disorder in remission Discontinue gabapentin for side effects. Restart Depakote 250 mg p.o. twice daily. Patient will need Depakote level done in a week from now. She does have Seroquel available however has not been using it.  Seroquel 50 mg p.o. nightly as needed  GAD-unstable Restart Depakote 250 mg p.o. twice daily Propranolol 10 mg p.o. 3 times daily Hydroxyzine 10 mg p.o. twice daily as needed for severe anxiety attacks Discussed referral for CBT, she is not interested  High risk medication use-will order Depakote level.  Cannabis use disorder and tobacco use disorder in early remission-Will follow up closely.  I have reviewed notes per Dr. Blaine Hamper 09/01/2020.  As noted above.  Follow-up in clinic in 6 to 8 weeks or sooner if needed.  This note was generated in part or whole with voice recognition software. Voice recognition is usually quite accurate but there are transcription errors that can and very often do occur. I apologize for any typographical errors that were not detected and corrected.       Ursula Alert, MD 09/09/2020, 12:30 PM

## 2020-09-08 NOTE — Progress Notes (Signed)
Hematology/Oncology Consult note [facility]  Telephone:([phone] [phone]  Patient Care Team: Cletis Athens, MD as PCP - General (Internal Medicine) Grant Fontana, [place] as Referring Physician (Chiropractic Medicine) Christene Lye, MD (General Surgery) Sindy Guadeloupe, MD as Consulting Physician (Hematology and Oncology)   Name of the patient: Sheryl Perez  [id number]  1960/10/17   Date of visit: 09/08/20  Diagnosis- pancreatic neck adenocarcinoma stage I cT1 N0 M0  Chief complaint/ Reason for visit-discuss neoadjuvant chemotherapy options for pancreatic cancer  Heme/Onc history: patient is a 60 year old female with a history of irritable bowel disease for which she follows up with [facility].  She has undergone EGD and colonoscopy with them in the past.  More recently patient was complaining of left upper quadrant and epigastric abdominal pain which causes sudden episodes of abdominal spasms and was prescribed as needed tramadol for it.  These episodes of pain are unrelated to food and are dull aching or throbbing.  She has undergone cholecystectomy in the past.  She underwent CT abdomen and pelvis with contrast in the ER on 08/04/2020 Which did not reveal any acute pathology which showed a possible hypoechoic 1.9 cm right hepatic lobe lesion.  This was followed by an MRI which showed no discrete lesion in the liver and the area of concern noted on CT scan was more likely to represent variable fat deposition.  However there was loss of normal T1 signal in the peripheral pancreas and a potential small pancreatic lesion suspicious for early pancreatic adenocarcinoma in the neck of the pancreas.     Patient underwent EUS by Dr. Mont Dutton which showed an irregular mass in the pancreatic neck measuring 15 x 11 mm.  FNA showed adenocarcinoma.  Pancreatic duct measured 1.1 mm in the head with abrupt dilatation in the region of the mass to 4.1 mm.  No  abnormality noted in the common bile duct and hepatic duct.  Celiac region showed no significant endosonographic abnormality.  No lymphadenopathy was seen.  CA 19-9 mildly elevated at 35  Patient was seen by Dr. Hyman Hopes from pancreaticobiliary surgery at [facility] for surgical opinion.  Although patient has a small lesion involving the pancreatic neck, there was a concern for tumor extension towards the splenic vein and narrowing of portal splenic confluence.  Neoadjuvant chemotherapy for 3 months was therefore favored  Interval history- Patient is overwhelmed with current developments.  She has a history of depression for which she sees psychiatry as well.  She is currently on as needed tramadol for abdominal pain  ECOG PS- 1 Pain scale- 3 Opioid associated constipation- no  Review of systems- Review of Systems  Constitutional:  Positive for malaise/fatigue. Negative for chills, fever and weight loss.  HENT:  Negative for congestion, ear discharge and nosebleeds.   Eyes:  Negative for blurred vision.  Respiratory:  Negative for cough, hemoptysis, sputum production, shortness of breath and wheezing.   Cardiovascular:  Negative for chest pain, palpitations, orthopnea and claudication.  Gastrointestinal:  Negative for abdominal pain, blood in stool, constipation, diarrhea, heartburn, melena, nausea and vomiting.  Genitourinary:  Negative for dysuria, flank pain, frequency, hematuria and urgency.  Musculoskeletal:  Negative for back pain, joint pain and myalgias.  Skin:  Negative for rash.  Neurological:  Negative for dizziness, tingling, focal weakness, seizures, weakness and headaches.  Endo/Heme/Allergies:  Does not bruise/bleed easily.  Psychiatric/Behavioral:  Positive for depression. Negative for suicidal ideas. The patient does not have insomnia.  Allergies  Allergen Reactions   Sulfacetamide Sodium Swelling    Diarrhea,dizziness, tongue swelling   Other Other (See Comments) and  Swelling    PT states that anything with the sides effects "may cause rash" she cannot take due to her psoriasis, it will make the patches on her hand so thick she can't use her hands.  Diarrhea,dizziness, tongue swelling   Oxycontin [Oxycodone Hcl] Nausea And Vomiting   Sulfa Antibiotics Swelling    Diarrhea,dizziness, tongue swelling   Abilify [Aripiprazole] Other (See Comments)   Cephalosporins Rash   Erythromycin Other (See Comments)    Abd. pain Abd. pain Other reaction(s): Other (See Comments) Abd. 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    Bipolar disorder (HCC)    Chest pain, unspecified    Chronic low back pain 10/23/2013   Chronic UTI    COPD (chronic obstructive pulmonary disease) (HCC)    Depression    Dysfunction of eustachian tube    Enlargement of lymph nodes    History of Bell's palsy Right   Injury, other and unspecified, knee, leg, ankle, and foot    MRSA (methicillin resistant staph aureus) culture positive 07-11-14   abscess   Other specified disease of hair and hair follicles    Rectal prolapse    Unspecified symptom associated with female genital organs      Past Surgical History:  Procedure Laterality Date   ABDOMINAL HYSTERECTOMY     bladder tack  2010   CHOLECYSTECTOMY  2002   FOOT SURGERY Bilateral 2004   bunion   GANGLION CYST EXCISION  2002   INCISION AND DRAINAGE ABSCESS  04-16-15   PARTIAL HYSTERECTOMY  2010   uterus removed    Social History   Socioeconomic History   Marital status: Married    Spouse name: Not on file   Number of children: 3   Years of education: hs   Highest education level: Some college, no degree  Occupational History   Not on file  Tobacco Use   Smoking status: Former    Packs/day: 0.50     Years: 40.00    Pack years: 20.00    Types: Cigarettes    Quit date: 07/14/2020    Years since quitting: 0.1   Smokeless tobacco: Never  Vaping Use   Vaping Use: Never used  Substance and Sexual Activity   Alcohol use: No    Alcohol/week: 1.0 standard drink    Types: 1 Glasses of wine per week    Comment: occasional   Drug use: Yes    Types: Marijuana    Comment: last used 05-20-15   Sexual activity: Yes    Birth control/protection: None  Other Topics Concern   Not on file  Social History Narrative   Patient lives at home with her husband [name]).   Patient works full time.   Education CNA    Right handed.   Caffeine Tea , Soda.   Social Determinants of Health   Financial Resource Strain: Not on file  Food Insecurity: Not on file  Transportation Needs: Not on file  Physical Activity: Not on file  Stress: Not on file  Social Connections: Not on file  Intimate Partner Violence: Not on file  Family History  Problem Relation Age of Onset   Parkinson's disease Maternal Grandfather    Diabetes Father    Cancer Father        brain tumor   Depression Father    Diabetes Paternal Grandfather    Colon polyps Mother    Heart disease Maternal Grandmother    Heart attack Maternal Grandmother    Stroke Maternal Grandmother    Depression Sister    Alcohol abuse Brother    Depression Brother    Breast cancer Paternal Aunt    Breast cancer Cousin      Current Outpatient Medications:    ADVAIR DISKUS 250-50 MCG/DOSE AEPB, , Disp: , Rfl:    ALPRAZolam (XANAX) 1 MG tablet, Take 1 tablet (1 mg total) by mouth 2 (two) times daily as needed for anxiety., Disp: 20 tablet, Rfl: 0   Ascorbic Acid (VITAMIN C) 1000 MG tablet, Take 1,000 mg by mouth daily., Disp: , Rfl:    ferrous sulfate 325 (65 FE) MG tablet, Take by mouth., Disp: , Rfl:    fluticasone (FLONASE) 50 MCG/ACT nasal spray, Place 1 spray into both nostrils daily., Disp: 11.1 mL, Rfl: 2   gabapentin (NEURONTIN) 100 MG  capsule, Take 1 capsule (100 mg total) by mouth daily with supper., Disp: 30 capsule, Rfl: 1   loratadine (CLARITIN) 10 MG tablet, TAKE 1 TABLET BY MOUTH EVERY DAY, Disp: 90 tablet, Rfl: 1   Loratadine 10 MG CAPS, Take by mouth., Disp: , Rfl:    Multiple Vitamin (MULTIVITAMIN) tablet, Take 1 tablet by mouth daily., Disp: , Rfl:    naproxen sodium (ALEVE) 220 MG tablet, Take by mouth., Disp: , Rfl:    propranolol (INDERAL) 20 MG tablet, Take 1 tablet (20 mg total) by mouth 3 (three) times daily., Disp: 270 tablet, Rfl: 1   QUEtiapine (SEROQUEL) 50 MG tablet, Take 1 tablet (50 mg total) by mouth at bedtime as needed., Disp: 90 tablet, Rfl: 0   traMADol (ULTRAM) 50 MG tablet, Take 1 tablet (50 mg total) by mouth every 6 (six) hours as needed., Disp: 120 tablet, Rfl: 0   albuterol (VENTOLIN HFA) 108 (90 Base) MCG/ACT inhaler, Inhale 2 puffs into the lungs every 4 (four) hours as needed for wheezing or shortness of breath. (Patient not taking: No sig reported), Disp: 8 g, Rfl: 4   ALPRAZolam (XANAX) 0.25 MG tablet, Take by mouth. (Patient not taking: No sig reported), Disp: , Rfl:    ALPRAZolam (XANAX) 0.5 MG tablet, Take 1 tablet (0.5 mg total) by mouth 2 (two) times daily as needed for anxiety. (Patient not taking: No sig reported), Disp: 20 tablet, Rfl: 1   cyclobenzaprine (FLEXERIL) 5 MG tablet, Take 5 mg by mouth 2 (two) times daily at 10 AM and 5 PM.  (Patient not taking: No sig reported), Disp: , Rfl:    dexamethasone (DECADRON) 4 MG tablet, Take 2 tablets (8 mg total) by mouth daily. Start the day after chemotherapy for 3 days. Take with food., Disp: 8 tablet, Rfl: 5   famotidine (PEPCID) 20 MG tablet, Take by mouth. (Patient not taking: No sig reported), Disp: , Rfl:    hydrOXYzine (ATARAX/VISTARIL) 10 MG tablet, TAKE 1 TABLET (10 MG TOTAL) BY MOUTH 2 (TWO) TIMES DAILY AS NEEDED. FOR SEVERE ANXIETY ATTACKS ONLY (Patient not taking: No sig reported), Disp: 180 tablet, Rfl: 1   KLOR-CON M20 20 MEQ  tablet, Take 20 mEq by mouth 2 (two) times daily.  (Patient not taking: No  sig reported), Disp: , Rfl: 4   lidocaine-prilocaine (EMLA) cream, Apply to affected area once, Disp: 30 g, Rfl: 3   loperamide (IMODIUM A-D) 2 MG tablet, Take 2 at onset of diarrhea, then 1 every 2hrs until 12hr without a BM. May take 2 tab every 4hrs at bedtime. If diarrhea recurs repeat., Disp: 100 tablet, Rfl: 1   megestrol (MEGACE) 400 MG/10ML suspension, Take 10 mLs (400 mg total) by mouth daily. (Patient not taking: No sig reported), Disp: 240 mL, Rfl: 0   methylPREDNISolone (MEDROL DOSEPAK) 4 MG TBPK tablet, Use as directed (Patient not taking: No sig reported), Disp: 21 tablet, Rfl: 0   ondansetron (ZOFRAN) 8 MG tablet, Take 1 tablet (8 mg total) by mouth 2 (two) times daily as needed. Start on day 3 after chemotherapy., Disp: 30 tablet, Rfl: 1   ondansetron (ZOFRAN-ODT) 4 MG disintegrating tablet, , Disp: , Rfl:    pantoprazole (PROTONIX) 40 MG tablet, , Disp: , Rfl:    Potassium Chloride ER 20 MEQ TBCR, , Disp: , Rfl:    prochlorperazine (COMPAZINE) 10 MG tablet, Take 1 tablet (10 mg total) by mouth every 6 (six) hours as needed (Nausea or vomiting)., Disp: 30 tablet, Rfl: 1   triamcinolone cream (KENALOG) 0.1 %, Apply 1 application topically 2 (two) times daily. (Patient not taking: No sig reported), Disp: 30 g, Rfl: 0  Physical exam:  Vitals:   09/08/20 1051  BP: (!) 129/54  Pulse: 77  Resp: 20  Temp: (!) 97 F (36.1 C)  TempSrc: Tympanic  SpO2: 100%  Weight: 118 lb 9.6 oz (53.8 kg)   Physical Exam Cardiovascular:     Rate and Rhythm: Normal rate and regular rhythm.     Heart sounds: Normal heart sounds.  Pulmonary:     Effort: Pulmonary effort is normal.     Breath sounds: Normal breath sounds.  Abdominal:     General: Bowel sounds are normal.     Palpations: Abdomen is soft.  Skin:    General: Skin is warm and dry.  Neurological:     Mental Status: She is alert and oriented to person, place,  and time.     CMP Latest Ref Rng & Units 08/04/2020  Glucose 70 - 99 mg/dL 98  BUN 6 - 20 mg/dL 15  Creatinine 0.44 - 1.00 mg/dL 0.93  Sodium 135 - 145 mmol/L 140  Potassium 3.5 - 5.1 mmol/L 4.0  Chloride 98 - 111 mmol/L 106  CO2 22 - 32 mmol/L 29  Calcium 8.9 - 10.3 mg/dL 9.2  Total Protein 6.5 - 8.1 g/dL 7.4  Total Bilirubin 0.3 - 1.2 mg/dL 0.5  Alkaline Phos 38 - 126 U/L 60  AST 15 - 41 U/L 20  ALT 0 - 44 U/L 16   CBC Latest Ref Rng & Units 08/04/2020  WBC 4.0 - 10.5 K/uL 7.6  Hemoglobin 12.0 - 15.0 g/dL 11.5(L)  Hematocrit 36.0 - 46.0 % 33.4(L)  Platelets 150 - 400 K/uL 324     Assessment and plan- Patient is a 60 y.o. female with stage I pancreatic neck adenocarcinoma cT1 cN0 M0 borderline resectable here to discuss neoadjuvant chemotherapy options  Patient was found to have a 1.5 cm pancreatic neck mass with no local regional adenopathy.  She was referred to [facility] pancreaticobiliary surgery and there was concern for tumor extension towards the splenic vein and narrowing of the portal splenic confluence.  Neoadjuvant chemotherapy was therefore recommended.  I discussed both modified FOLFIRINOX chemotherapy and  gemcitabine Abraxane are acceptable neoadjuvant chemotherapy options.  There have not been compared head-to-head and there is little data neoadjuvant gemcitabine and Abraxane.  I will be referring her to genetic testing Which will also give [place] information on germline genetic mutations associated with HRR deficiency.  Regardless I would recommend proceeding with neoadjuvant modified FOLFIRINOX chemotherapy with 5-FU given this infusion over 46 hours or 2400 mg per metered square along with antiemetic and 150 mg per metered square and oxaliplatin which I will dose reduced to 65 mg per metered square.  This will be given every 2 weeks for 6 cycles followed by repeat scans at [facility].  Patient will have to come on day 3 after chemotherapy for pump disconnect.  Discussed risks and  benefits of modified FOLFIRINOX chemotherapy including all but not limited to nausea, vomiting, low blood counts, risk of infections and hospitalizations.  Risk of peripheral neuropathy associated with oxaliplatin and diarrhea associated with irinotecan.  Treatment will be given with a curative intent.  If patient does not tolerate modified FOLFIRINOX chemotherapy well I will switch her to gemcitabine and Abraxane chemotherapy which is given 3 weeks on 1 week off.  We will proceed with port placement and chemo teach at this time.  Tentatively plan to start her on chemotherapy in about 10 days time  Patient does reports symptoms of depression but denies any suicidal or homicidal ideations.  She is following up with psychiatry as well.   Visit Diagnosis 1. Pancreatic adenocarcinoma ([facility])   2. Goals of care, counseling/discussion      Dr. Randa Evens, MD, MPH [facility] at [facility] [phone] 09/08/2020 1:15 PM

## 2020-09-08 NOTE — Progress Notes (Signed)
START ON PATHWAY REGIMEN - Pancreatic Adenocarcinoma     A cycle is every 14 days:     Oxaliplatin      Leucovorin      Irinotecan      Fluorouracil   **Always confirm dose/schedule in your pharmacy ordering system**  Patient Characteristics: Preoperative (Clinical Staging), Borderline Resectable, PS = 0,1, BRCA1/2 and PALB2 Mutation Absent/Unknown Therapeutic Status: Preoperative (Clinical Staging) AJCC T Category: cT1c AJCC N Category: cN0 Resectability Status: Borderline Resectable AJCC M Category: cM0 AJCC 8 Stage Grouping: IA ECOG Performance Status: 1 BRCA1/2 Mutation Status: Awaiting Test Results PALB2 Mutation Status: Awaiting Test Results Intent of Therapy: Curative Intent, Discussed with Patient

## 2020-09-08 NOTE — H&P (View-Only) (Signed)
Hematology/Oncology Consult note [facility]  [phone] [phone]  Patient Care Team: Cletis Athens, MD as PCP - General (Internal Medicine) Grant Fontana, [place] as Referring Physician (Chiropractic Medicine) Christene Lye, MD (General Surgery) Sindy Guadeloupe, MD as Consulting Physician (Hematology and Oncology)   Name of the patient: Sheryl Perez  [id number]  08/16/1960   Date of visit: 09/08/20  Diagnosis- pancreatic neck adenocarcinoma stage I cT1 N0 M0  Chief complaint/ Reason for visit-discuss neoadjuvant chemotherapy options for pancreatic cancer  Heme/Onc history: patient is a 60 year old female with a history of irritable bowel disease for which she follows up with [facility].  She has undergone EGD and colonoscopy with them in the past.  More recently patient was complaining of left upper quadrant and epigastric abdominal pain which causes sudden episodes of abdominal spasms and was prescribed as needed tramadol for it.  These episodes of pain are unrelated to food and are dull aching or throbbing.  She has undergone cholecystectomy in the past.  She underwent CT abdomen and pelvis with contrast in the ER on 08/04/2020 Which did not reveal any acute pathology which showed a possible hypoechoic 1.9 cm right hepatic lobe lesion.  This was followed by an MRI which showed no discrete lesion in the liver and the area of concern noted on CT scan was more likely to represent variable fat deposition.  However there was loss of normal T1 signal in the peripheral pancreas and a potential small pancreatic lesion suspicious for early pancreatic adenocarcinoma in the neck of the pancreas.     Patient underwent EUS by Dr. Mont Dutton which showed an irregular mass in the pancreatic neck measuring 15 x 11 mm.  FNA showed adenocarcinoma.  Pancreatic duct measured 1.1 mm in the head with abrupt dilatation in the region of the mass to 4.1 mm.  No  abnormality noted in the common bile duct and hepatic duct.  Celiac region showed no significant endosonographic abnormality.  No lymphadenopathy was seen.  CA 19-9 mildly elevated at 35  Patient was seen by Dr. Hyman Hopes from pancreaticobiliary surgery at [facility] for surgical opinion.  Although patient has a small lesion involving the pancreatic neck, there was a concern for tumor extension towards the splenic vein and narrowing of portal splenic confluence.  Neoadjuvant chemotherapy for 3 months was therefore favored  Interval history- Patient is overwhelmed with current developments.  She has a history of depression for which she sees psychiatry as well.  She is currently on as needed tramadol for abdominal pain  ECOG PS- 1 Pain scale- 3 Opioid associated constipation- no  Review of systems- Review of Systems  Constitutional:  Positive for malaise/fatigue. Negative for chills, fever and weight loss.  HENT:  Negative for congestion, ear discharge and nosebleeds.   Eyes:  Negative for blurred vision.  Respiratory:  Negative for cough, hemoptysis, sputum production, shortness of breath and wheezing.   Cardiovascular:  Negative for chest pain, palpitations, orthopnea and claudication.  Gastrointestinal:  Negative for abdominal pain, blood in stool, constipation, diarrhea, heartburn, melena, nausea and vomiting.  Genitourinary:  Negative for dysuria, flank pain, frequency, hematuria and urgency.  Musculoskeletal:  Negative for back pain, joint pain and myalgias.  Skin:  Negative for rash.  Neurological:  Negative for dizziness, tingling, focal weakness, seizures, weakness and headaches.  Endo/Heme/Allergies:  Does not bruise/bleed easily.  Psychiatric/Behavioral:  Positive for depression. Negative for suicidal ideas. The patient does not have insomnia.  Allergies  Allergen Reactions   Sulfacetamide Sodium Swelling    Diarrhea,dizziness, tongue swelling   Other Other (See Comments) and  Swelling    PT states that anything with the sides effects "may cause rash" she cannot take due to her psoriasis, it will make the patches on her hand so thick she can't use her hands.  Diarrhea,dizziness, tongue swelling   Oxycontin [Oxycodone Hcl] Nausea And Vomiting   Sulfa Antibiotics Swelling    Diarrhea,dizziness, tongue swelling   Abilify [Aripiprazole] Other (See Comments)   Cephalosporins Rash   Erythromycin Other (See Comments)    Abd. pain Abd. pain Other reaction(s): Other (See Comments) Abd. 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    Bipolar disorder (HCC)    Chest pain, unspecified    Chronic low back pain 10/23/2013   Chronic UTI    COPD (chronic obstructive pulmonary disease) (HCC)    Depression    Dysfunction of eustachian tube    Enlargement of lymph nodes    History of Bell's palsy Right   Injury, other and unspecified, knee, leg, ankle, and foot    MRSA (methicillin resistant staph aureus) culture positive 07-11-14   abscess   Other specified disease of hair and hair follicles    Rectal prolapse    Unspecified symptom associated with female genital organs      Past Surgical History:  Procedure Laterality Date   ABDOMINAL HYSTERECTOMY     bladder tack  2010   CHOLECYSTECTOMY  2002   FOOT SURGERY Bilateral 2004   bunion   GANGLION CYST EXCISION  2002   INCISION AND DRAINAGE ABSCESS  04-16-15   PARTIAL HYSTERECTOMY  2010   uterus removed    Social History   Socioeconomic History   Marital status: Married    Spouse name: Not on file   Number of children: 3   Years of education: hs   Highest education level: Some college, no degree  Occupational History   Not on file  Tobacco Use   Smoking status: Former    Packs/day: 0.50     Years: 40.00    Pack years: 20.00    Types: Cigarettes    Quit date: 07/14/2020    Years since quitting: 0.1   Smokeless tobacco: Never  Vaping Use   Vaping Use: Never used  Substance and Sexual Activity   Alcohol use: No    Alcohol/week: 1.0 standard drink    Types: 1 Glasses of wine per week    Comment: occasional   Drug use: Yes    Types: Marijuana    Comment: last used 05-20-15   Sexual activity: Yes    Birth control/protection: None  Other Topics Concern   Not on file  Social History Narrative   Patient lives at home with her husband [name]).   Patient works full time.   Education CNA    Right handed.   Caffeine Tea , Soda.   Social Determinants of Health   Financial Resource Strain: Not on file  Food Insecurity: Not on file  Transportation Needs: Not on file  Physical Activity: Not on file  Stress: Not on file  Social Connections: Not on file  Intimate Partner Violence: Not on file  Family History  Problem Relation Age of Onset   Parkinson's disease Maternal Grandfather    Diabetes Father    Cancer Father        brain tumor   Depression Father    Diabetes Paternal Grandfather    Colon polyps Mother    Heart disease Maternal Grandmother    Heart attack Maternal Grandmother    Stroke Maternal Grandmother    Depression Sister    Alcohol abuse Brother    Depression Brother    Breast cancer Paternal Aunt    Breast cancer Cousin      Current Outpatient Medications:    ADVAIR DISKUS 250-50 MCG/DOSE AEPB, , Disp: , Rfl:    ALPRAZolam (XANAX) 1 MG tablet, Take 1 tablet (1 mg total) by mouth 2 (two) times daily as needed for anxiety., Disp: 20 tablet, Rfl: 0   Ascorbic Acid (VITAMIN C) 1000 MG tablet, Take 1,000 mg by mouth daily., Disp: , Rfl:    ferrous sulfate 325 (65 FE) MG tablet, Take by mouth., Disp: , Rfl:    fluticasone (FLONASE) 50 MCG/ACT nasal spray, Place 1 spray into both nostrils daily., Disp: 11.1 mL, Rfl: 2   gabapentin (NEURONTIN) 100 MG  capsule, Take 1 capsule (100 mg total) by mouth daily with supper., Disp: 30 capsule, Rfl: 1   loratadine (CLARITIN) 10 MG tablet, TAKE 1 TABLET BY MOUTH EVERY DAY, Disp: 90 tablet, Rfl: 1   Loratadine 10 MG CAPS, Take by mouth., Disp: , Rfl:    Multiple Vitamin (MULTIVITAMIN) tablet, Take 1 tablet by mouth daily., Disp: , Rfl:    naproxen sodium (ALEVE) 220 MG tablet, Take by mouth., Disp: , Rfl:    propranolol (INDERAL) 20 MG tablet, Take 1 tablet (20 mg total) by mouth 3 (three) times daily., Disp: 270 tablet, Rfl: 1   QUEtiapine (SEROQUEL) 50 MG tablet, Take 1 tablet (50 mg total) by mouth at bedtime as needed., Disp: 90 tablet, Rfl: 0   traMADol (ULTRAM) 50 MG tablet, Take 1 tablet (50 mg total) by mouth every 6 (six) hours as needed., Disp: 120 tablet, Rfl: 0   albuterol (VENTOLIN HFA) 108 (90 Base) MCG/ACT inhaler, Inhale 2 puffs into the lungs every 4 (four) hours as needed for wheezing or shortness of breath. (Patient not taking: No sig reported), Disp: 8 g, Rfl: 4   ALPRAZolam (XANAX) 0.25 MG tablet, Take by mouth. (Patient not taking: No sig reported), Disp: , Rfl:    ALPRAZolam (XANAX) 0.5 MG tablet, Take 1 tablet (0.5 mg total) by mouth 2 (two) times daily as needed for anxiety. (Patient not taking: No sig reported), Disp: 20 tablet, Rfl: 1   cyclobenzaprine (FLEXERIL) 5 MG tablet, Take 5 mg by mouth 2 (two) times daily at 10 AM and 5 PM.  (Patient not taking: No sig reported), Disp: , Rfl:    dexamethasone (DECADRON) 4 MG tablet, Take 2 tablets (8 mg total) by mouth daily. Start the day after chemotherapy for 3 days. Take with food., Disp: 8 tablet, Rfl: 5   famotidine (PEPCID) 20 MG tablet, Take by mouth. (Patient not taking: No sig reported), Disp: , Rfl:    hydrOXYzine (ATARAX/VISTARIL) 10 MG tablet, TAKE 1 TABLET (10 MG TOTAL) BY MOUTH 2 (TWO) TIMES DAILY AS NEEDED. FOR SEVERE ANXIETY ATTACKS ONLY (Patient not taking: No sig reported), Disp: 180 tablet, Rfl: 1   KLOR-CON M20 20 MEQ  tablet, Take 20 mEq by mouth 2 (two) times daily.  (Patient not taking: No  sig reported), Disp: , Rfl: 4   lidocaine-prilocaine (EMLA) cream, Apply to affected area once, Disp: 30 g, Rfl: 3   loperamide (IMODIUM A-D) 2 MG tablet, Take 2 at onset of diarrhea, then 1 every 2hrs until 12hr without a BM. May take 2 tab every 4hrs at bedtime. If diarrhea recurs repeat., Disp: 100 tablet, Rfl: 1   megestrol (MEGACE) 400 MG/10ML suspension, Take 10 mLs (400 mg total) by mouth daily. (Patient not taking: No sig reported), Disp: 240 mL, Rfl: 0   methylPREDNISolone (MEDROL DOSEPAK) 4 MG TBPK tablet, Use as directed (Patient not taking: No sig reported), Disp: 21 tablet, Rfl: 0   ondansetron (ZOFRAN) 8 MG tablet, Take 1 tablet (8 mg total) by mouth 2 (two) times daily as needed. Start on day 3 after chemotherapy., Disp: 30 tablet, Rfl: 1   ondansetron (ZOFRAN-ODT) 4 MG disintegrating tablet, , Disp: , Rfl:    pantoprazole (PROTONIX) 40 MG tablet, , Disp: , Rfl:    Potassium Chloride ER 20 MEQ TBCR, , Disp: , Rfl:    prochlorperazine (COMPAZINE) 10 MG tablet, Take 1 tablet (10 mg total) by mouth every 6 (six) hours as needed (Nausea or vomiting)., Disp: 30 tablet, Rfl: 1   triamcinolone cream (KENALOG) 0.1 %, Apply 1 application topically 2 (two) times daily. (Patient not taking: No sig reported), Disp: 30 g, Rfl: 0  Physical exam:  Vitals:   09/08/20 1051  BP: (!) 129/54  Pulse: 77  Resp: 20  Temp: (!) 97 F (36.1 C)  TempSrc: Tympanic  SpO2: 100%  Weight: 118 lb 9.6 oz (53.8 kg)   Physical Exam Cardiovascular:     Rate and Rhythm: Normal rate and regular rhythm.     Heart sounds: Normal heart sounds.  Pulmonary:     Effort: Pulmonary effort is normal.     Breath sounds: Normal breath sounds.  Abdominal:     General: Bowel sounds are normal.     Palpations: Abdomen is soft.  Skin:    General: Skin is warm and dry.  Neurological:     Mental Status: She is alert and oriented to person, place,  and time.     CMP Latest Ref Rng & Units 08/04/2020  Glucose 70 - 99 mg/dL 98  BUN 6 - 20 mg/dL 15  Creatinine 0.44 - 1.00 mg/dL 0.93  Sodium 135 - 145 mmol/L 140  Potassium 3.5 - 5.1 mmol/L 4.0  Chloride 98 - 111 mmol/L 106  CO2 22 - 32 mmol/L 29  Calcium 8.9 - 10.3 mg/dL 9.2  Total Protein 6.5 - 8.1 g/dL 7.4  Total Bilirubin 0.3 - 1.2 mg/dL 0.5  Alkaline Phos 38 - 126 U/L 60  AST 15 - 41 U/L 20  ALT 0 - 44 U/L 16   CBC Latest Ref Rng & Units 08/04/2020  WBC 4.0 - 10.5 K/uL 7.6  Hemoglobin 12.0 - 15.0 g/dL 11.5(L)  Hematocrit 36.0 - 46.0 % 33.4(L)  Platelets 150 - 400 K/uL 324     Assessment and plan- Patient is a 60 y.o. female with stage I pancreatic neck adenocarcinoma cT1 cN0 M0 borderline resectable here to discuss neoadjuvant chemotherapy options  Patient was found to have a 1.5 cm pancreatic neck mass with no local regional adenopathy.  She was referred to [facility] pancreaticobiliary surgery and there was concern for tumor extension towards the splenic vein and narrowing of the portal splenic confluence.  Neoadjuvant chemotherapy was therefore recommended.  I discussed both modified FOLFIRINOX chemotherapy and  gemcitabine Abraxane are acceptable neoadjuvant chemotherapy options.  There have not been compared head-to-head and there is little data neoadjuvant gemcitabine and Abraxane.  I will be referring her to genetic testing Which will also give [place] information on germline genetic mutations associated with HRR deficiency.  Regardless I would recommend proceeding with neoadjuvant modified FOLFIRINOX chemotherapy with 5-FU given this infusion over 46 hours or 2400 mg per metered square along with antiemetic and 150 mg per metered square and oxaliplatin which I will dose reduced to 65 mg per metered square.  This will be given every 2 weeks for 6 cycles followed by repeat scans at [facility].  Patient will have to come on day 3 after chemotherapy for pump disconnect.  Discussed risks and  benefits of modified FOLFIRINOX chemotherapy including all but not limited to nausea, vomiting, low blood counts, risk of infections and hospitalizations.  Risk of peripheral neuropathy associated with oxaliplatin and diarrhea associated with irinotecan.  Treatment will be given with a curative intent.  If patient does not tolerate modified FOLFIRINOX chemotherapy well I will switch her to gemcitabine and Abraxane chemotherapy which is given 3 weeks on 1 week off.  We will proceed with port placement and chemo teach at this time.  Tentatively plan to start her on chemotherapy in about 10 days time  Patient does reports symptoms of depression but denies any suicidal or homicidal ideations.  She is following up with psychiatry as well.   Visit Diagnosis 1. Pancreatic adenocarcinoma ([place])   2. Goals of care, counseling/discussion      Dr. Randa Evens, MD, MPH [facility] [phone] 09/08/2020 1:15 PM

## 2020-09-09 ENCOUNTER — Other Ambulatory Visit: Payer: Self-pay | Admitting: *Deleted

## 2020-09-09 ENCOUNTER — Telehealth (INDEPENDENT_AMBULATORY_CARE_PROVIDER_SITE_OTHER): Payer: Self-pay

## 2020-09-09 ENCOUNTER — Encounter (INDEPENDENT_AMBULATORY_CARE_PROVIDER_SITE_OTHER): Payer: Self-pay

## 2020-09-09 DIAGNOSIS — C259 Malignant neoplasm of pancreas, unspecified: Secondary | ICD-10-CM

## 2020-09-09 MED ORDER — DIVALPROEX SODIUM 250 MG PO DR TAB: 250.0000 mg | DELAYED_RELEASE_TABLET | Freq: Two times a day (BID) | ORAL | 1 refills | Status: DC

## 2020-09-09 NOTE — Telephone Encounter (Signed)
Patient is schedule for porta cath insertion on 09/11/20 with Dr Lucky Cowboy arrival time 11:15. The patient was giving pre-procedure instructions.

## 2020-09-10 ENCOUNTER — Inpatient Hospital Stay: Payer: BC Managed Care – PPO | Admitting: *Deleted

## 2020-09-10 ENCOUNTER — Other Ambulatory Visit: Payer: Self-pay

## 2020-09-10 ENCOUNTER — Telehealth: Payer: Self-pay | Admitting: *Deleted

## 2020-09-10 DIAGNOSIS — C259 Malignant neoplasm of pancreas, unspecified: Secondary | ICD-10-CM

## 2020-09-10 NOTE — Progress Notes (Signed)
Multidisciplinary Oncology Council Documentation  Sheryl Perez was presented by our [facility] on 09/10/2020, which included representatives from:  Palliative Care Dietitian  Physical/Occupational Therapist Nurse Navigator Genetics Speech Therapist Social work Research RN   Jadence currently presents with history of pancreatic cancer.  We reviewed previous medical and familial history, history of present illness, and recent lab results along with all available histopathologic and imaging studies. The [place] considered available treatment options and made the following recommendations/referrals:  Rehab services will follow and offer recommendations on as needed basis.   The MOC is a meeting of clinicians from various specialty areas who evaluate and discuss patients for whom a multidisciplinary approach is being considered. Final determinations in the plan of care are those of the provider(s).   Today's extended care, comprehensive team conference, Sheryl Perez was not present for the discussion and was not examined.

## 2020-09-10 NOTE — Telephone Encounter (Signed)
Called pt and let her know that the only day the port can be inserted is tomorrow ( Thursday). I know that she has a chemo education class 9 to :11:30. The vascular doctor says you have to be at medical mall at 11:15 so she will need to leave class at 11 am. I spoke with [facility] and she will call pt and go over all the stuff she will need to know. Patient needs to be NPO for 8 hours prior to the procedure. Patient was called yesterday from vascular. I just wanted pt to know what is the best that we can do  in order to get chemo this Friday. She understands. She knows to go get numbing cream this evening would be best. I told her she needs it before she comes in Friday.

## 2020-09-11 ENCOUNTER — Encounter: Payer: Self-pay | Admitting: Registered Nurse

## 2020-09-11 ENCOUNTER — Ambulatory Visit
Admission: RE | Admit: 2020-09-11 | Discharge: 2020-09-11 | Disposition: A | Discharge status: Home / Self Care | Payer: BC Managed Care – PPO | Attending: Vascular Surgery | Admitting: Vascular Surgery

## 2020-09-11 ENCOUNTER — Other Ambulatory Visit: Payer: Self-pay

## 2020-09-11 ENCOUNTER — Encounter: Payer: Self-pay | Admitting: Vascular Surgery

## 2020-09-11 ENCOUNTER — Inpatient Hospital Stay: Payer: BC Managed Care – PPO

## 2020-09-11 ENCOUNTER — Encounter
Admission: RE | Disposition: A | Discharge status: Home / Self Care | Payer: Self-pay | Source: Home / Self Care | Attending: Vascular Surgery

## 2020-09-11 ENCOUNTER — Other Ambulatory Visit (INDEPENDENT_AMBULATORY_CARE_PROVIDER_SITE_OTHER): Payer: Self-pay | Admitting: Nurse Practitioner

## 2020-09-11 DIAGNOSIS — Z7951 Long term (current) use of inhaled steroids: Secondary | ICD-10-CM | POA: Insufficient documentation

## 2020-09-11 DIAGNOSIS — Z90711 Acquired absence of uterus with remaining cervical stump: Secondary | ICD-10-CM | POA: Diagnosis not present

## 2020-09-11 DIAGNOSIS — Z881 Allergy status to other antibiotic agents status: Secondary | ICD-10-CM | POA: Insufficient documentation

## 2020-09-11 DIAGNOSIS — Z882 Allergy status to sulfonamides status: Secondary | ICD-10-CM | POA: Diagnosis not present

## 2020-09-11 DIAGNOSIS — C801 Malignant (primary) neoplasm, unspecified: Secondary | ICD-10-CM | POA: Diagnosis not present

## 2020-09-11 DIAGNOSIS — Z803 Family history of malignant neoplasm of breast: Secondary | ICD-10-CM | POA: Insufficient documentation

## 2020-09-11 DIAGNOSIS — J449 Chronic obstructive pulmonary disease, unspecified: Secondary | ICD-10-CM | POA: Diagnosis not present

## 2020-09-11 DIAGNOSIS — Z79899 Other long term (current) drug therapy: Secondary | ICD-10-CM | POA: Diagnosis not present

## 2020-09-11 DIAGNOSIS — Z7952 Long term (current) use of systemic steroids: Secondary | ICD-10-CM | POA: Diagnosis not present

## 2020-09-11 DIAGNOSIS — Z0000 Encounter for general adult medical examination without abnormal findings: Secondary | ICD-10-CM

## 2020-09-11 DIAGNOSIS — Z808 Family history of malignant neoplasm of other organs or systems: Secondary | ICD-10-CM | POA: Diagnosis not present

## 2020-09-11 DIAGNOSIS — Z888 Allergy status to other drugs, medicaments and biological substances status: Secondary | ICD-10-CM | POA: Insufficient documentation

## 2020-09-11 DIAGNOSIS — Z87891 Personal history of nicotine dependence: Secondary | ICD-10-CM | POA: Diagnosis not present

## 2020-09-11 DIAGNOSIS — Z9049 Acquired absence of other specified parts of digestive tract: Secondary | ICD-10-CM | POA: Insufficient documentation

## 2020-09-11 DIAGNOSIS — Z8619 Personal history of other infectious and parasitic diseases: Secondary | ICD-10-CM | POA: Insufficient documentation

## 2020-09-11 DIAGNOSIS — Z885 Allergy status to narcotic agent status: Secondary | ICD-10-CM | POA: Insufficient documentation

## 2020-09-11 DIAGNOSIS — C259 Malignant neoplasm of pancreas, unspecified: Secondary | ICD-10-CM | POA: Diagnosis not present

## 2020-09-11 HISTORY — PX: PORTA CATH INSERTION: CATH118285

## 2020-09-11 SURGERY — PORTA CATH INSERTION
Anesthesia: Moderate Sedation

## 2020-09-11 MED ORDER — CHLORHEXIDINE GLUCONATE CLOTH 2 % EX PADS
6.0000 | MEDICATED_PAD | Freq: Every day | CUTANEOUS | Status: DC
  Administered 2020-09-11: 6 via TOPICAL

## 2020-09-11 MED ORDER — MIDAZOLAM HCL 5 MG/5ML IJ SOLN
INTRAMUSCULAR | Status: AC
  Filled 2020-09-11: qty 5

## 2020-09-11 MED ORDER — ONDANSETRON HCL 4 MG/2ML IJ SOLN: 4.0000 mg | Freq: Four times a day (QID) | INTRAMUSCULAR | Status: DC | PRN

## 2020-09-11 MED ORDER — SODIUM CHLORIDE 0.9 % IV SOLN
Freq: Once | INTRAVENOUS | Status: DC
  Filled 2020-09-11: qty 2

## 2020-09-11 MED ORDER — FENTANYL CITRATE (PF) 100 MCG/2ML IJ SOLN: 12.5000 ug | Freq: Once | INTRAMUSCULAR | Status: DC | PRN

## 2020-09-11 MED ORDER — DIPHENHYDRAMINE HCL 50 MG/ML IJ SOLN: 50.0000 mg | Freq: Once | INTRAMUSCULAR | Status: DC | PRN

## 2020-09-11 MED ORDER — FENTANYL CITRATE (PF) 100 MCG/2ML IJ SOLN
INTRAMUSCULAR | Status: DC | PRN
  Administered 2020-09-11 (×2): 50 ug via INTRAVENOUS

## 2020-09-11 MED ORDER — FENTANYL CITRATE (PF) 100 MCG/2ML IJ SOLN
INTRAMUSCULAR | Status: AC
  Filled 2020-09-11: qty 2

## 2020-09-11 MED ORDER — SODIUM CHLORIDE 0.9 % IV SOLN: INTRAVENOUS | Status: DC

## 2020-09-11 MED ORDER — CLINDAMYCIN PHOSPHATE 300 MG/50ML IV SOLN: 300.0000 mg | Freq: Once | INTRAVENOUS | Status: DC

## 2020-09-11 MED ORDER — FAMOTIDINE 20 MG PO TABS: 40.0000 mg | ORAL_TABLET | Freq: Once | ORAL | Status: DC | PRN

## 2020-09-11 MED ORDER — HEPARIN SODIUM (PORCINE) 1000 UNIT/ML IJ SOLN
INTRAMUSCULAR | Status: AC
  Filled 2020-09-11: qty 1

## 2020-09-11 MED ORDER — MIDAZOLAM HCL 2 MG/2ML IJ SOLN
INTRAMUSCULAR | Status: DC | PRN
  Administered 2020-09-11: 2 mg via INTRAVENOUS
  Administered 2020-09-11: 1 mg via INTRAVENOUS

## 2020-09-11 MED ORDER — CLINDAMYCIN PHOSPHATE 300 MG/50ML IV SOLN
INTRAVENOUS | Status: AC
  Filled 2020-09-11: qty 50

## 2020-09-11 MED ORDER — METHYLPREDNISOLONE SODIUM SUCC 125 MG IJ SOLR: 125.0000 mg | Freq: Once | INTRAMUSCULAR | Status: DC | PRN

## 2020-09-11 MED ORDER — MIDAZOLAM HCL 2 MG/ML PO SYRP: 8.0000 mg | ORAL_SOLUTION | Freq: Once | ORAL | Status: DC | PRN

## 2020-09-11 SURGICAL SUPPLY — 12 items
ADH SKN CLS APL DERMABOND .7 (GAUZE/BANDAGES/DRESSINGS) ×1
COVER PROBE U/S 5X48 (MISCELLANEOUS) ×2 IMPLANT
COVER SURGICAL LIGHT HANDLE (MISCELLANEOUS) ×2 IMPLANT
DERMABOND ADVANCED (GAUZE/BANDAGES/DRESSINGS) ×1
DERMABOND ADVANCED .7 DNX12 (GAUZE/BANDAGES/DRESSINGS) ×1 IMPLANT
KIT PORT POWER 8FR ISP CVUE (Port) ×2 IMPLANT
PACK ANGIOGRAPHY (CUSTOM PROCEDURE TRAY) ×2 IMPLANT
PENCIL ELECTRO HAND CTR (MISCELLANEOUS) ×2 IMPLANT
SPONGE XRAY 4X4 16PLY STRL (MISCELLANEOUS) ×2 IMPLANT
SUT MNCRL AB 4-0 PS2 18 (SUTURE) ×2 IMPLANT
SUT VIC AB 3-0 SH 27 (SUTURE) ×2
SUT VIC AB 3-0 SH 27X BRD (SUTURE) ×1 IMPLANT

## 2020-09-11 NOTE — Op Note (Signed)
      [facility] [facility]       Operative Note  Date: 09/11/2020  Preoperative diagnosis:  1. Pancreas cancer  Postoperative diagnosis:  Same as above  Procedures: #1. Ultrasound guidance for vascular access to the right internal jugular vein. #2. Fluoroscopic guidance for placement of catheter. #3. Placement of CT compatible Port-A-Cath, right internal jugular vein.  Surgeon: Leotis Pain, MD.   Anesthesia: Local with moderate conscious sedation for approximately 24  minutes using 3 mg of Versed and 100 mcg of Fentanyl  Fluoroscopy time: less than 1 minute  Contrast used: 0  Estimated blood loss: 3 cc  Indication for the procedure:  The patient is a 60 y.o.female with pancreatic cancer.  The patient needs a Port-A-Cath for durable venous access, chemotherapy, lab draws, and CT scans. We are asked to place this. Risks and benefits were discussed and informed consent was obtained.  Description of procedure: The patient was brought to the vascular and interventional radiology suite.  Moderate conscious sedation was administered throughout the procedure during a face to face encounter with the patient with my supervision of the RN administering medicines and monitoring the patient's vital signs, pulse oximetry, telemetry and mental status throughout from the start of the procedure until the patient was taken to the recovery room. The right neck chest and shoulder were sterilely prepped and draped, and a sterile surgical field was created. Ultrasound was used to help visualize a patent right internal jugular vein. This was then accessed under direct ultrasound guidance without difficulty with the Seldinger needle and a permanent image was recorded. A J-wire was placed. After skin nick and dilatation, the peel-away sheath was then placed over the wire. I then anesthetized an area under the clavicle approximately 1-2 fingerbreadths. A transverse incision was created and an inferior  pocket was created with electrocautery and blunt dissection. The port was then brought onto the field, placed into the pocket and secured to the chest wall with 2 Prolene sutures. The catheter was connected to the port and tunneled from the subclavicular incision to the access site. Fluoroscopic guidance was then used to cut the catheter to an appropriate length. The catheter was then placed through the peel-away sheath and the peel-away sheath was removed. The catheter tip was parked in excellent location under fluorocoscopic guidance in the cavoatrial junction. The pocket was then irrigated with antibiotic impregnated saline and the wound was closed with a running 3-0 Vicryl and a 4-0 Monocryl. The access incision was closed with a single 4-0 Monocryl. The Huber needle was used to withdraw blood and flush the port with heparinized saline. Dermabond was then placed as a dressing. The patient tolerated the procedure well and was taken to the recovery room in stable condition.   Leotis Pain 09/11/2020 1:04 PM   This note was created with Dragon Medical transcription system. Any errors in dictation are purely unintentional.

## 2020-09-11 NOTE — Interval H&P Note (Signed)
History and Physical Interval Note:  09/11/2020 12:20 PM  Sheryl Perez  has presented today for surgery, with the diagnosis of Port Placement   Adenocarcinoma.  The various methods of treatment have been discussed with the patient and family. After consideration of risks, benefits and other options for treatment, the patient has consented to  Procedure(s): PORTA CATH INSERTION (N/A) as a surgical intervention.  The patient's history has been reviewed, patient examined, no change in status, stable for surgery.  I have reviewed the patient's chart and labs.  Questions were answered to the patient's satisfaction.     Leotis Pain

## 2020-09-12 ENCOUNTER — Inpatient Hospital Stay: Payer: BC Managed Care – PPO

## 2020-09-12 ENCOUNTER — Other Ambulatory Visit: Payer: BC Managed Care – PPO

## 2020-09-12 ENCOUNTER — Ambulatory Visit: Payer: BC Managed Care – PPO | Admitting: Oncology

## 2020-09-12 ENCOUNTER — Other Ambulatory Visit: Payer: Self-pay | Admitting: Oncology

## 2020-09-12 ENCOUNTER — Ambulatory Visit: Payer: BC Managed Care – PPO

## 2020-09-12 DIAGNOSIS — Z79899 Other long term (current) drug therapy: Secondary | ICD-10-CM | POA: Diagnosis not present

## 2020-09-12 DIAGNOSIS — Z8269 Family history of other diseases of the musculoskeletal system and connective tissue: Secondary | ICD-10-CM | POA: Diagnosis not present

## 2020-09-12 DIAGNOSIS — Z87891 Personal history of nicotine dependence: Secondary | ICD-10-CM | POA: Diagnosis not present

## 2020-09-12 DIAGNOSIS — R1013 Epigastric pain: Secondary | ICD-10-CM | POA: Diagnosis not present

## 2020-09-12 DIAGNOSIS — Z811 Family history of alcohol abuse and dependence: Secondary | ICD-10-CM | POA: Diagnosis not present

## 2020-09-12 DIAGNOSIS — Z808 Family history of malignant neoplasm of other organs or systems: Secondary | ICD-10-CM | POA: Diagnosis not present

## 2020-09-12 DIAGNOSIS — Z823 Family history of stroke: Secondary | ICD-10-CM | POA: Diagnosis not present

## 2020-09-12 DIAGNOSIS — C257 Malignant neoplasm of other parts of pancreas: Secondary | ICD-10-CM | POA: Diagnosis not present

## 2020-09-12 DIAGNOSIS — Z5111 Encounter for antineoplastic chemotherapy: Secondary | ICD-10-CM | POA: Diagnosis not present

## 2020-09-12 DIAGNOSIS — Z833 Family history of diabetes mellitus: Secondary | ICD-10-CM | POA: Diagnosis not present

## 2020-09-12 DIAGNOSIS — Z803 Family history of malignant neoplasm of breast: Secondary | ICD-10-CM | POA: Diagnosis not present

## 2020-09-12 DIAGNOSIS — C259 Malignant neoplasm of pancreas, unspecified: Secondary | ICD-10-CM

## 2020-09-12 DIAGNOSIS — Z818 Family history of other mental and behavioral disorders: Secondary | ICD-10-CM | POA: Diagnosis not present

## 2020-09-12 DIAGNOSIS — R5383 Other fatigue: Secondary | ICD-10-CM | POA: Diagnosis not present

## 2020-09-12 DIAGNOSIS — F319 Bipolar disorder, unspecified: Secondary | ICD-10-CM | POA: Diagnosis not present

## 2020-09-12 LAB — CBC WITH DIFFERENTIAL/PLATELET
Abs Immature Granulocytes: 0.04 10*3/uL (ref 0.00–0.07)
Basophils Absolute: 0 10*3/uL (ref 0.0–0.1)
Basophils Relative: 1 %
Eosinophils Absolute: 0.1 10*3/uL (ref 0.0–0.5)
Eosinophils Relative: 1 %
HCT: 34.5 % — ABNORMAL LOW (ref 36.0–46.0)
Hemoglobin: 11.2 g/dL — ABNORMAL LOW (ref 12.0–15.0)
Immature Granulocytes: 1 %
Lymphocytes Relative: 14 %
Lymphs Abs: 1.3 10*3/uL (ref 0.7–4.0)
MCH: 32.4 pg (ref 26.0–34.0)
MCHC: 32.5 g/dL (ref 30.0–36.0)
MCV: 99.7 fL (ref 80.0–100.0)
Monocytes Absolute: 0.6 10*3/uL (ref 0.1–1.0)
Monocytes Relative: 7 %
Neutro Abs: 6.7 10*3/uL (ref 1.7–7.7)
Neutrophils Relative %: 76 %
Platelets: 295 10*3/uL (ref 150–400)
RBC: 3.46 MIL/uL — ABNORMAL LOW (ref 3.87–5.11)
RDW: 12.5 % (ref 11.5–15.5)
WBC: 8.8 10*3/uL (ref 4.0–10.5)
nRBC: 0 % (ref 0.0–0.2)

## 2020-09-12 LAB — COMPREHENSIVE METABOLIC PANEL
ALT: 13 U/L (ref 0–44)
AST: 20 U/L (ref 15–41)
Albumin: 3.9 g/dL (ref 3.5–5.0)
Alkaline Phosphatase: 66 U/L (ref 38–126)
Anion gap: 6 (ref 5–15)
BUN: 10 mg/dL (ref 6–20)
CO2: 30 mmol/L (ref 22–32)
Calcium: 9.1 mg/dL (ref 8.9–10.3)
Chloride: 101 mmol/L (ref 98–111)
Creatinine, Ser: 0.86 mg/dL (ref 0.44–1.00)
GFR, Estimated: >60 mL/min (ref >60)
Glucose, Bld: 100 mg/dL — ABNORMAL HIGH (ref 70–99)
Potassium: 3.9 mmol/L (ref 3.5–5.1)
Sodium: 137 mmol/L (ref 135–145)
Total Bilirubin: 0.4 mg/dL (ref 0.3–1.2)
Total Protein: 7.3 g/dL (ref 6.5–8.1)

## 2020-09-12 MED ORDER — DEXTROSE 5 % IV SOLN
Freq: Once | INTRAVENOUS | Status: AC
  Filled 2020-09-12: qty 250

## 2020-09-12 MED ORDER — SODIUM CHLORIDE 0.9 % IV SOLN
10.0000 mg | Freq: Once | INTRAVENOUS | Status: AC
  Administered 2020-09-12: 10 mg via INTRAVENOUS
  Filled 2020-09-12: qty 10

## 2020-09-12 MED ORDER — ATROPINE SULFATE 1 MG/ML IJ SOLN
0.5000 mg | Freq: Once | INTRAMUSCULAR | Status: AC | PRN
  Administered 2020-09-12: 0.5 mg via INTRAVENOUS
  Filled 2020-09-12: qty 1

## 2020-09-12 MED ORDER — SODIUM CHLORIDE 0.9 % IV SOLN
600.0000 mg | Freq: Once | INTRAVENOUS | Status: AC
  Administered 2020-09-12: 600 mg via INTRAVENOUS
  Filled 2020-09-12: qty 30

## 2020-09-12 MED ORDER — SODIUM CHLORIDE 0.9 % IV SOLN
2400.0000 mg/m2 | INTRAVENOUS | Status: DC
  Administered 2020-09-12: 3600 mg via INTRAVENOUS
  Filled 2020-09-12: qty 72

## 2020-09-12 MED ORDER — OXALIPLATIN CHEMO INJECTION 100 MG/20ML
65.0000 mg/m2 | Freq: Once | INTRAVENOUS | Status: AC
  Administered 2020-09-12: 100 mg via INTRAVENOUS
  Filled 2020-09-12: qty 20

## 2020-09-12 MED ORDER — SODIUM CHLORIDE 0.9 % IV SOLN
150.0000 mg | Freq: Once | INTRAVENOUS | Status: AC
  Administered 2020-09-12: 150 mg via INTRAVENOUS
  Filled 2020-09-12: qty 150

## 2020-09-12 MED ORDER — PALONOSETRON HCL INJECTION 0.25 MG/5ML
0.2500 mg | Freq: Once | INTRAVENOUS | Status: AC
  Administered 2020-09-12: 0.25 mg via INTRAVENOUS
  Filled 2020-09-12: qty 5

## 2020-09-12 MED ORDER — SODIUM CHLORIDE 0.9 % IV SOLN
220.0000 mg | Freq: Once | INTRAVENOUS | Status: AC
  Administered 2020-09-12: 220 mg via INTRAVENOUS
  Filled 2020-09-12: qty 10

## 2020-09-12 NOTE — Patient Instructions (Addendum)
[facility]  Discharge Instructions: Thank you for choosing [facility] to provide your oncology and hematology care.  If you have a lab appointment with the [facility], please go directly to the [facility] and check in at the registration area.  Wear comfortable clothing and clothing appropriate for easy access to any Portacath or PICC line.   We strive to give you quality time with your provider. You may need to reschedule your appointment if you arrive late (15 or more minutes).  Arriving late affects you and other patients whose appointments are after yours.  Also, if you miss three or more appointments without notifying the office, you may be dismissed from the clinic at the provider's discretion.      For prescription refill requests, have your pharmacy contact our office and allow 72 hours for refills to be completed.    Today you received the following chemotherapy and/or immunotherapy agents: Oxaliplatin, Leucovorin, Irinotecan, Fluorouracil pump   The chemotherapy medication bag should finish at 46 hours, 96 hours, or 7 days. For example, if your pump is scheduled for 46 hours and it was put on at 4:00 p.m., it should finish at 2:00 p.m. the day it is scheduled to come off regardless of your appointment time.     Estimated time to finish at 12:45pm.   If the display on your pump reads "Low Volume" and it is beeping, take the batteries out of the pump and come to the cancer center for it to be taken off.   If the pump alarms go off prior to the pump reading "Low Volume" then call [phone] and someone can assist you.  If the plunger comes out and the chemotherapy medication is leaking out, please use your home chemo spill kit to clean up the spill. Do NOT use paper towels or other household products.  If you have problems or questions regarding your pump, please call either [phone] (24 hours a day) or the cancer  center Monday-Friday 8:00 a.m.- 4:30 p.m. at the clinic number and we will assist you. If you are unable to get assistance, then go to the nearest Emergency Department and ask the staff to contact the IV team for assistance.        To help prevent nausea and vomiting after your treatment, we encourage you to take your nausea medication as directed.  BELOW ARE SYMPTOMS THAT SHOULD BE REPORTED IMMEDIATELY: *FEVER GREATER THAN 100.4 F (38 C) OR HIGHER *CHILLS OR SWEATING *NAUSEA AND VOMITING THAT IS NOT CONTROLLED WITH YOUR NAUSEA MEDICATION *UNUSUAL SHORTNESS OF BREATH *UNUSUAL BRUISING OR BLEEDING *URINARY PROBLEMS (pain or burning when urinating, or frequent urination) *BOWEL PROBLEMS (unusual diarrhea, constipation, pain near the anus) TENDERNESS IN MOUTH AND THROAT WITH OR WITHOUT PRESENCE OF ULCERS (sore throat, sores in mouth, or a toothache) UNUSUAL RASH, SWELLING OR PAIN  UNUSUAL VAGINAL DISCHARGE OR ITCHING   Items with * indicate a potential emergency and should be followed up as soon as possible or go to the Emergency Department if any problems should occur.  Please show the CHEMOTHERAPY ALERT CARD or IMMUNOTHERAPY ALERT CARD at check-in to the Emergency Department and triage nurse.  Should you have questions after your visit or need to cancel or reschedule your appointment, please contact [facility]  [phone] and follow the prompts.  Office hours are 8:00 a.m. to 4:30 p.m. Monday - Friday. Please note that voicemails left after 4:00 p.m.  may not be returned until the following business day.  We are closed weekends and major holidays. You have access to a nurse at all times for urgent questions. Please call the main number to the clinic [phone] and follow the prompts.  For any non-urgent questions, you may also contact your provider using MyChart. We now offer e-Visits for anyone 55 and older to request care online for non-urgent  symptoms. For details visit [email or web address].   Also download the MyChart app! Go to the app store, search "MyChart", open the app, select [facility], and log in with your MyChart username and password.  Due to Covid, a mask is required upon entering the hospital/clinic. If you do not have a mask, one will be given to you upon arrival. For doctor visits, patients may have 1 support person aged 87 or older with them. For treatment visits, patients cannot have anyone with them due to current Covid guidelines and our immunocompromised population. Fluorouracil, 5-FU injection What is this medication? FLUOROURACIL, 5-FU (flure oh YOOR a sil) is a chemotherapy drug. It slows the growth of cancer cells. This medicine is used to treat many types of cancer like breast cancer, colon or rectal cancer, pancreatic cancer, and stomachcancer. This medicine may be used for other purposes; ask your health care provider orpharmacist if you have questions. COMMON BRAND NAME(S): Adrucil What should I tell my care team before I take this medication? They need to know if you have any of these conditions: blood disorders dihydropyrimidine dehydrogenase (DPD) deficiency infection (especially a virus infection such as chickenpox, cold sores, or herpes) kidney disease liver disease malnourished, poor nutrition recent or ongoing radiation therapy an unusual or allergic reaction to fluorouracil, other chemotherapy, other medicines, foods, dyes, or preservatives pregnant or trying to get pregnant breast-feeding How should I use this medication? This drug is given as an infusion or injection into a vein. It is administeredin a hospital or clinic by a specially trained health care professional. Talk to your pediatrician regarding the use of this medicine in children.Special care may be needed. Overdosage: If you think you have taken too much of this medicine contact apoison control center or emergency room at  once. NOTE: This medicine is only for you. Do not share this medicine with others. What if I miss a dose? It is important not to miss your dose. Call your doctor or health careprofessional if you are unable to keep an appointment. What may interact with this medication? Do not take this medicine with any of the following medications: live virus vaccines This medicine may also interact with the following medications: medicines that treat or prevent blood clots like warfarin, enoxaparin, and dalteparin This list may not describe all possible interactions. Give your health care provider a list of all the medicines, herbs, non-prescription drugs, or dietary supplements you use. Also tell them if you smoke, drink alcohol, or use illegaldrugs. Some items may interact with your medicine. What should I watch for while using this medication? Visit your doctor for checks on your progress. This drug may make you feel generally unwell. This is not uncommon, as chemotherapy can affect healthy cells as well as cancer cells. Report any side effects. Continue your course oftreatment even though you feel ill unless your doctor tells you to stop. In some cases, you may be given additional medicines to help with side effects.Follow all directions for their use. Call your doctor or health care professional for advice if you get a fever, chills  or sore throat, or other symptoms of a cold or flu. Do not treat yourself. This drug decreases your body's ability to fight infections. Try toavoid being around people who are sick. This medicine may increase your risk to bruise or bleed. Call your doctor orhealth care professional if you notice any unusual bleeding. Be careful brushing and flossing your teeth or using a toothpick because you may get an infection or bleed more easily. If you have any dental work done,tell your dentist you are receiving this medicine. Avoid taking products that contain aspirin, acetaminophen,  ibuprofen, naproxen, or ketoprofen unless instructed by your doctor. These medicines may hide afever. Do not become pregnant while taking this medicine. Women should inform their doctor if they wish to become pregnant or think they might be pregnant. There is a potential for serious side effects to an unborn child. Talk to your health care professional or pharmacist for more information. Do not breast-feed aninfant while taking this medicine. Men should inform their doctor if they wish to father a child. This medicinemay lower sperm counts. Do not treat diarrhea with over the counter products. Contact your doctor ifyou have diarrhea that lasts more than 2 days or if it is severe and watery. This medicine can make you more sensitive to the sun. Keep out of the sun. If you cannot avoid being in the sun, wear protective clothing and use sunscreen.Do not use sun lamps or tanning beds/booths. What side effects may I notice from receiving this medication? Side effects that you should report to your doctor or health care professionalas soon as possible: allergic reactions like skin rash, itching or hives, swelling of the face, lips, or tongue low blood counts - this medicine may decrease the number of white blood cells, red blood cells and platelets. You may be at increased risk for infections and bleeding. signs of infection - fever or chills, cough, sore throat, pain or difficulty passing urine signs of decreased platelets or bleeding - bruising, pinpoint red spots on the skin, black, tarry stools, blood in the urine signs of decreased red blood cells - unusually weak or tired, fainting spells, lightheadedness breathing problems changes in vision chest pain mouth sores nausea and vomiting pain, swelling, redness at site where injected pain, tingling, numbness in the hands or feet redness, swelling, or sores on hands or feet stomach pain unusual bleeding Side effects that usually do not require  medical attention (report to yourdoctor or health care professional if they continue or are bothersome): changes in finger or toe nails diarrhea dry or itchy skin hair loss headache loss of appetite sensitivity of eyes to the light stomach upset unusually teary eyes This list may not describe all possible side effects. Call your doctor for medical advice about side effects. You may report side effects to FDA at1-800-FDA-1088. Where should I keep my medication? This drug is given in a hospital or clinic and will not be stored at home. NOTE: This sheet is a summary. It may not cover all possible information. If you have questions about this medicine, talk to your doctor, pharmacist, orhealth care provider.  2022 Elsevier/Gold Standard (2018-12-26 15:00:03) Leucovorin injection What is this medication? LEUCOVORIN (loo koe VOR in) is used to prevent or treat the harmful effects of some medicines. This medicine is used to treat anemia caused by a low amount of folic acid in the body. It is also used with 5-fluorouracil (5-FU) to treatcolon cancer. This medicine may be used for other purposes; ask  your health care provider orpharmacist if you have questions. What should I tell my care team before I take this medication? They need to know if you have any of these conditions: anemia from low levels of vitamin B-12 in the blood an unusual or allergic reaction to leucovorin, folic acid, other medicines, foods, dyes, or preservatives pregnant or trying to get pregnant breast-feeding How should I use this medication? This medicine is for injection into a muscle or into a vein. It is given by ahealth care professional in a hospital or clinic setting. Talk to your pediatrician regarding the use of this medicine in children.Special care may be needed. Overdosage: If you think you have taken too much of this medicine contact apoison control center or emergency room at once. NOTE: This medicine is only  for you. Do not share this medicine with others. What if I miss a dose? This does not apply. What may interact with this medication? capecitabine fluorouracil phenobarbital phenytoin primidone trimethoprim-sulfamethoxazole This list may not describe all possible interactions. Give your health care provider a list of all the medicines, herbs, non-prescription drugs, or dietary supplements you use. Also tell them if you smoke, drink alcohol, or use illegaldrugs. Some items may interact with your medicine. What should I watch for while using this medication? Your condition will be monitored carefully while you are receiving thismedicine. This medicine may increase the side effects of 5-fluorouracil, 5-FU. Tell your doctor or health care professional if you have diarrhea or mouth sores that donot get better or that get worse. What side effects may I notice from receiving this medication? Side effects that you should report to your doctor or health care professionalas soon as possible: allergic reactions like skin rash, itching or hives, swelling of the face, lips, or tongue breathing problems fever, infection mouth sores unusual bleeding or bruising unusually weak or tired Side effects that usually do not require medical attention (report to yourdoctor or health care professional if they continue or are bothersome): constipation or diarrhea loss of appetite nausea, vomiting This list may not describe all possible side effects. Call your doctor for medical advice about side effects. You may report side effects to FDA at1-800-FDA-1088. Where should I keep my medication? This drug is given in a hospital or clinic and will not be stored at home. NOTE: This sheet is a summary. It may not cover all possible information. If you have questions about this medicine, talk to your doctor, pharmacist, orhealth care provider.  2022 Elsevier/Gold Standard (2007-08-01 16:50:29) Irinotecan injection What  is this medication? IRINOTECAN (ir in oh TEE kan ) is a chemotherapy drug. It is used to treatcolon and rectal cancer. This medicine may be used for other purposes; ask your health care provider orpharmacist if you have questions. COMMON BRAND NAME(S): Camptosar What should I tell my care team before I take this medication? They need to know if you have any of these conditions: dehydration diarrhea infection (especially a virus infection such as chickenpox, cold sores, or herpes) liver disease low blood counts, like low white cell, platelet, or red cell counts low levels of calcium, magnesium, or potassium in the blood recent or ongoing radiation therapy an unusual or allergic reaction to irinotecan, other medicines, foods, dyes, or preservatives pregnant or trying to get pregnant breast-feeding How should I use this medication? This drug is given as an infusion into a vein. It is administered in a hospitalor clinic by a specially trained health care professional. Talk to your  pediatrician regarding the use of this medicine in children.Special care may be needed. Overdosage: If you think you have taken too much of this medicine contact apoison control center or emergency room at once. NOTE: This medicine is only for you. Do not share this medicine with others. What if I miss a dose? It is important not to miss your dose. Call your doctor or health careprofessional if you are unable to keep an appointment. What may interact with this medication? Do not take this medicine with any of the following medications: cobicistat itraconazole This medicine may interact with the following medications: antiviral medicines for HIV or AIDS certain antibiotics like rifampin or rifabutin certain medicines for fungal infections like ketoconazole, posaconazole, and voriconazole certain medicines for seizures like carbamazepine, phenobarbital, phenotoin clarithromycin gemfibrozil nefazodone [facility]  W[facility] This list may not describe all possible interactions. Give your health care provider a list of all the medicines, herbs, non-prescription drugs, or dietary supplements you use. Also tell them if you smoke, drink alcohol, or use illegaldrugs. Some items may interact with your medicine. What should I watch for while using this medication? Your condition will be monitored carefully while you are receiving this medicine. You will need important blood work done while you are taking thismedicine. This drug may make you feel generally unwell. This is not uncommon, as chemotherapy can affect healthy cells as well as cancer cells. Report any side effects. Continue your course of treatment even though you feel ill unless yourdoctor tells you to stop. In some cases, you may be given additional medicines to help with side effects.Follow all directions for their use. You may get drowsy or dizzy. Do not drive, use machinery, or do anything that needs mental alertness until you know how this medicine affects you. Do not stand or sit up quickly, especially if you are an older patient. This reducesthe risk of dizzy or fainting spells. Call your health care professional for advice if you get a fever, chills, or sore throat, or other symptoms of a cold or flu. Do not treat yourself. This medicine decreases your body's ability to fight infections. Try to avoid beingaround people who are sick. Avoid taking products that contain aspirin, acetaminophen, ibuprofen, naproxen, or ketoprofen unless instructed by your doctor. These medicines may hide afever. This medicine may increase your risk to bruise or bleed. Call your doctor orhealth care professional if you notice any unusual bleeding. Be careful brushing and flossing your teeth or using a toothpick because you may get an infection or bleed more easily. If you have any dental work done,tell your dentist you are receiving this medicine. Do not become pregnant while taking  this medicine or for 6 months after stopping it. Women should inform their health care professional if they wish to become pregnant or think they might be pregnant. Men should not father a child while taking this medicine and for 3 months after stopping it. There is potential for serious side effects to an unborn child. Talk to your health careprofessional for more information. Do not breast-feed an infant while taking this medicine or for 7 days afterstopping it. This medicine has caused ovarian failure in some women. This medicine may make it more difficult to get pregnant. Talk to your health care professional if Ventura Sellers concerned about your fertility. This medicine has caused decreased sperm counts in some men. This may make it more difficult to father a child. Talk to your health care professional if Ventura Sellers concerned about your fertility.  What side effects may I notice from receiving this medication? Side effects that you should report to your doctor or health care professionalas soon as possible: allergic reactions like skin rash, itching or hives, swelling of the face, lips, or tongue chest pain diarrhea flushing, runny nose, sweating during infusion low blood counts - this medicine may decrease the number of white blood cells, red blood cells and platelets. You may be at increased risk for infections and bleeding. nausea, vomiting pain, swelling, warmth in the leg signs of decreased platelets or bleeding - bruising, pinpoint red spots on the skin, black, tarry stools, blood in the urine signs of infection - fever or chills, cough, sore throat, pain or difficulty passing urine signs of decreased red blood cells - unusually weak or tired, fainting spells, lightheadedness Side effects that usually do not require medical attention (report to yourdoctor or health care professional if they continue or are bothersome): constipation hair loss headache loss of appetite mouth sores stomach  pain This list may not describe all possible side effects. Call your doctor for medical advice about side effects. You may report side effects to FDA at1-800-FDA-1088. Where should I keep my medication? This drug is given in a hospital or clinic and will not be stored at home. NOTE: This sheet is a summary. It may not cover all possible information. If you have questions about this medicine, talk to your doctor, pharmacist, orhealth care provider.  2022 Elsevier/Gold Standard (2018-12-26 17:46:13) Oxaliplatin Injection What is this medication? OXALIPLATIN (ox AL i PLA tin) is a chemotherapy drug. It targets fast dividing cells, like cancer cells, and causes these cells to die. This medicine is usedto treat cancers of the colon and rectum, and many other cancers. This medicine may be used for other purposes; ask your health care provider orpharmacist if you have questions. COMMON BRAND NAME(S): Eloxatin What should I tell my care team before I take this medication? They need to know if you have any of these conditions: heart disease history of irregular heartbeat liver disease low blood counts, like white cells, platelets, or red blood cells lung or breathing disease, like asthma take medicines that treat or prevent blood clots tingling of the fingers or toes, or other nerve disorder an unusual or allergic reaction to oxaliplatin, other chemotherapy, other medicines, foods, dyes, or preservatives pregnant or trying to get pregnant breast-feeding How should I use this medication? This drug is given as an infusion into a vein. It is administered in a hospitalor clinic by a specially trained health care professional. Talk to your pediatrician regarding the use of this medicine in children.Special care may be needed. Overdosage: If you think you have taken too much of this medicine contact apoison control center or emergency room at once. NOTE: This medicine is only for you. Do not share this  medicine with others. What if I miss a dose? It is important not to miss a dose. Call your doctor or health careprofessional if you are unable to keep an appointment. What may interact with this medication? Do not take this medicine with any of the following medications: cisapride dronedarone pimozide thioridazine This medicine may also interact with the following medications: aspirin and aspirin-like medicines certain medicines that treat or prevent blood clots like warfarin, apixaban, dabigatran, and rivaroxaban cisplatin cyclosporine diuretics medicines for infection like acyclovir, adefovir, amphotericin B, bacitracin, cidofovir, foscarnet, ganciclovir, gentamicin, pentamidine, vancomycin NSAIDs, medicines for pain and inflammation, like ibuprofen or naproxen other medicines that prolong the QT  interval (an abnormal heart rhythm) pamidronate zoledronic acid This list may not describe all possible interactions. Give your health care provider a list of all the medicines, herbs, non-prescription drugs, or dietary supplements you use. Also tell them if you smoke, drink alcohol, or use illegaldrugs. Some items may interact with your medicine. What should I watch for while using this medication? Your condition will be monitored carefully while you are receiving thismedicine. You may need blood work done while you are taking this medicine. This medicine may make you feel generally unwell. This is not uncommon as chemotherapy can affect healthy cells as well as cancer cells. Report any side effects. Continue your course of treatment even though you feel ill unless yourhealthcare professional tells you to stop. This medicine can make you more sensitive to cold. Do not drink cold drinks or use ice. Cover exposed skin before coming in contact with cold temperatures or cold objects. When out in cold weather wear warm clothing and cover your mouth and nose to warm the air that goes into your lungs.  Tell your doctor if you getsensitive to the cold. Do not become pregnant while taking this medicine or for 9 months after stopping it. Women should inform their health care professional if they wish to become pregnant or think they might be pregnant. Men should not father a child while taking this medicine and for 6 months after stopping it. There is potential for serious side effects to an unborn child. Talk to your health careprofessional for more information. Do not breast-feed a child while taking this medicine or for 3 months afterstopping it. This medicine has caused ovarian failure in some women. This medicine may make it more difficult to get pregnant. Talk to your health care professional if Ventura Sellers concerned about your fertility. This medicine has caused decreased sperm counts in some men. This may make it more difficult to father a child. Talk to your health care professional if Ventura Sellers concerned about your fertility. This medicine may increase your risk of getting an infection. Call your health care professional for advice if you get a fever, chills, or sore throat, or other symptoms of a cold or flu. Do not treat yourself. Try to avoid beingaround people who are sick. Avoid taking medicines that contain aspirin, acetaminophen, ibuprofen, naproxen, or ketoprofen unless instructed by your health care professional.These medicines may hide a fever. Be careful brushing or flossing your teeth or using a toothpick because you may get an infection or bleed more easily. If you have any dental work done, Primary school teacher you are receiving this medicine. What side effects may I notice from receiving this medication? Side effects that you should report to your doctor or health care professionalas soon as possible: allergic reactions like skin rash, itching or hives, swelling of the face, lips, or tongue breathing problems cough low blood counts - this medicine may decrease the number of white blood  cells, red blood cells, and platelets. You may be at increased risk for infections and bleeding nausea, vomiting pain, redness, or irritation at site where injected pain, tingling, numbness in the hands or feet signs and symptoms of bleeding such as bloody or black, tarry stools; red or dark brown urine; spitting up blood or brown material that looks like coffee grounds; red spots on the skin; unusual bruising or bleeding from the eyes, gums, or nose signs and symptoms of a dangerous change in heartbeat or heart rhythm like chest pain; dizziness; fast, irregular heartbeat; palpitations; feeling  faint or lightheaded; falls signs and symptoms of infection like fever; chills; cough; sore throat; pain or trouble passing urine signs and symptoms of liver injury like dark yellow or brown urine; general ill feeling or flu-like symptoms; light-colored stools; loss of appetite; nausea; right upper belly pain; unusually weak or tired; yellowing of the eyes or skin signs and symptoms of low red blood cells or anemia such as unusually weak or tired; feeling faint or lightheaded; falls signs and symptoms of muscle injury like dark urine; trouble passing urine or change in the amount of urine; unusually weak or tired; muscle pain; back pain Side effects that usually do not require medical attention (report to yourdoctor or health care professional if they continue or are bothersome): changes in taste diarrhea gas hair loss loss of appetite mouth sores This list may not describe all possible side effects. Call your doctor for medical advice about side effects. You may report side effects to FDA at1-800-FDA-1088. Where should I keep my medication? This drug is given in a hospital or clinic and will not be stored at home. NOTE: This sheet is a summary. It may not cover all possible information. If you have questions about this medicine, talk to your doctor, pharmacist, orhealth care provider.  2022  Elsevier/Gold Standard (2018-06-14 12:20:35)

## 2020-09-15 ENCOUNTER — Encounter: Payer: Self-pay | Admitting: Oncology

## 2020-09-15 ENCOUNTER — Inpatient Hospital Stay: Payer: BC Managed Care – PPO

## 2020-09-15 ENCOUNTER — Telehealth: Payer: Self-pay

## 2020-09-15 ENCOUNTER — Other Ambulatory Visit: Payer: Self-pay

## 2020-09-15 ENCOUNTER — Inpatient Hospital Stay: Payer: BC Managed Care – PPO | Admitting: Oncology

## 2020-09-15 VITALS — BP 136/54 | HR 71 | Temp 95.8°F | Resp 18

## 2020-09-15 DIAGNOSIS — Z823 Family history of stroke: Secondary | ICD-10-CM | POA: Diagnosis not present

## 2020-09-15 DIAGNOSIS — F319 Bipolar disorder, unspecified: Secondary | ICD-10-CM | POA: Diagnosis not present

## 2020-09-15 DIAGNOSIS — Z803 Family history of malignant neoplasm of breast: Secondary | ICD-10-CM | POA: Diagnosis not present

## 2020-09-15 DIAGNOSIS — Z808 Family history of malignant neoplasm of other organs or systems: Secondary | ICD-10-CM | POA: Diagnosis not present

## 2020-09-15 DIAGNOSIS — Z818 Family history of other mental and behavioral disorders: Secondary | ICD-10-CM | POA: Diagnosis not present

## 2020-09-15 DIAGNOSIS — Z87891 Personal history of nicotine dependence: Secondary | ICD-10-CM | POA: Diagnosis not present

## 2020-09-15 DIAGNOSIS — Z8269 Family history of other diseases of the musculoskeletal system and connective tissue: Secondary | ICD-10-CM | POA: Diagnosis not present

## 2020-09-15 DIAGNOSIS — Z833 Family history of diabetes mellitus: Secondary | ICD-10-CM | POA: Diagnosis not present

## 2020-09-15 DIAGNOSIS — R1013 Epigastric pain: Secondary | ICD-10-CM | POA: Diagnosis not present

## 2020-09-15 DIAGNOSIS — Z79899 Other long term (current) drug therapy: Secondary | ICD-10-CM | POA: Diagnosis not present

## 2020-09-15 DIAGNOSIS — C259 Malignant neoplasm of pancreas, unspecified: Secondary | ICD-10-CM

## 2020-09-15 DIAGNOSIS — Z811 Family history of alcohol abuse and dependence: Secondary | ICD-10-CM | POA: Diagnosis not present

## 2020-09-15 DIAGNOSIS — R5383 Other fatigue: Secondary | ICD-10-CM | POA: Diagnosis not present

## 2020-09-15 DIAGNOSIS — Z5111 Encounter for antineoplastic chemotherapy: Secondary | ICD-10-CM | POA: Diagnosis not present

## 2020-09-15 DIAGNOSIS — C257 Malignant neoplasm of other parts of pancreas: Secondary | ICD-10-CM | POA: Diagnosis not present

## 2020-09-15 MED ORDER — HEPARIN SOD (PORK) LOCK FLUSH 100 UNIT/ML IV SOLN
500.0000 [IU] | Freq: Once | INTRAVENOUS | Status: AC | PRN
  Administered 2020-09-15: 500 [IU]
  Filled 2020-09-15: qty 5

## 2020-09-15 MED ORDER — SODIUM CHLORIDE 0.9% FLUSH
10.0000 mL | INTRAVENOUS | Status: DC | PRN
  Administered 2020-09-15: 10 mL
  Filled 2020-09-15: qty 10

## 2020-09-15 NOTE — Telephone Encounter (Signed)
Telephone call to patient for follow up after receiving first infusion.   Patient states infusion went great.  States eating good and drinking plenty of fluids.   Denies any nausea or vomiting.  Encouraged patient to call for any concerns or questions. 

## 2020-09-17 ENCOUNTER — Encounter: Payer: Self-pay | Admitting: Licensed Clinical Social Worker

## 2020-09-17 ENCOUNTER — Inpatient Hospital Stay: Payer: BC Managed Care – PPO

## 2020-09-17 ENCOUNTER — Telehealth: Payer: Self-pay | Admitting: Oncology

## 2020-09-17 ENCOUNTER — Inpatient Hospital Stay (HOSPITAL_BASED_OUTPATIENT_CLINIC_OR_DEPARTMENT_OTHER): Payer: BC Managed Care – PPO | Admitting: Licensed Clinical Social Worker

## 2020-09-17 DIAGNOSIS — Z803 Family history of malignant neoplasm of breast: Secondary | ICD-10-CM

## 2020-09-17 DIAGNOSIS — Z8042 Family history of malignant neoplasm of prostate: Secondary | ICD-10-CM

## 2020-09-17 DIAGNOSIS — C259 Malignant neoplasm of pancreas, unspecified: Secondary | ICD-10-CM | POA: Diagnosis not present

## 2020-09-17 DIAGNOSIS — Z808 Family history of malignant neoplasm of other organs or systems: Secondary | ICD-10-CM | POA: Diagnosis not present

## 2020-09-17 NOTE — Progress Notes (Signed)
REFERRING PROVIDER: Sindy Guadeloupe, MD [place],  [place] [address]  PRIMARY PROVIDER:  Cletis Athens, MD  PRIMARY REASON FOR VISIT:  1. Pancreatic adenocarcinoma ([facility])   2. Family history of breast cancer   3. Family history of brain cancer   4. Family history of prostate cancer      HISTORY OF PRESENT ILLNESS:   Sheryl Perez, a 60 y.o. female, was seen for a [facility] cancer genetics consultation at the request of Dr. Janese Banks due to a personal and family history of cancer.  Sheryl Perez presents to clinic today to discuss the possibility of a hereditary predisposition to cancer, genetic testing, and to further clarify her future cancer risks, as well as potential cancer risks for family members.   In 2022, at the age of 68, Sheryl Perez was diagnosed with pancreatic adenocarcinoma, stage 1A. The treatment plan currently includes neoadjuvant chemotherapy.  CANCER HISTORY:  Oncology History  Pancreatic adenocarcinoma ([facility])  09/01/2020 Initial Diagnosis   Pancreatic adenocarcinoma ([facility])    09/01/2020 Cancer Staging   Staging form: Exocrine Pancreas, AJCC 8th Edition - Clinical stage from 09/01/2020: Stage IA (cT1, cN0, cM0) - Signed by Sindy Guadeloupe, MD on 09/01/2020  Total positive nodes: 0    09/12/2020 -  Chemotherapy    Patient is on Treatment Plan: PANCREAS MODIFIED FOLFIRINOX Q14D X 4 CYCLES          RISK FACTORS:  Menarche was at age 87.  First live birth at age 24.  OCP use yes, for short time. Ovaries intact: yes.  Hysterectomy: yes.  Menopausal status: postmenopausal.  HRT use: 0 years. Colonoscopy: yes; normal. Mammogram within the last year: yes. Number of breast biopsies: 0.   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    Bipolar disorder (HCC)    Chest pain, unspecified    Chronic low back pain 10/23/2013   Chronic UTI    COPD (chronic obstructive pulmonary disease)  (HCC)    Depression    Dysfunction of eustachian tube    Enlargement of lymph nodes    Family history of brain cancer    Family history of breast cancer    Family history of prostate cancer    History of Bell's palsy Right   Injury, other and unspecified, knee, leg, ankle, and foot    MRSA (methicillin resistant staph aureus) culture positive 07/11/2014   abscess   Other specified disease of hair and hair follicles    Rectal prolapse    Unspecified symptom associated with female genital organs     Past Surgical History:  Procedure Laterality Date   ABDOMINAL HYSTERECTOMY     bladder tack  2010   CHOLECYSTECTOMY  2002   FOOT SURGERY Bilateral 2004   bunion   GANGLION CYST EXCISION  2002   INCISION AND DRAINAGE ABSCESS  04-16-15   PARTIAL HYSTERECTOMY  2010   uterus removed   PORTA CATH INSERTION N/A 09/11/2020   Procedure: PORTA CATH INSERTION;  Surgeon: Algernon Huxley, MD;  Location: [facility];  Service: Cardiovascular;  Laterality: N/A;    Social History   Socioeconomic History   Marital status: Married    Spouse name: Not on file   Number of children: 3   Years of education: hs   Highest education level: Some college, no degree  Occupational History   Not on file  Tobacco  Use   Smoking status: Former    Packs/day: 0.50    Years: 40.00    Pack years: 20.00    Types: Cigarettes    Quit date: 07/14/2020    Years since quitting: 0.1   Smokeless tobacco: Never  Vaping Use   Vaping Use: Never used  Substance and Sexual Activity   Alcohol use: No    Alcohol/week: 1.0 standard drink    Types: 1 Glasses of wine per week    Comment: occasional   Drug use: Yes    Types: Marijuana    Comment: last used 05-20-15   Sexual activity: Yes    Birth control/protection: None  Other Topics Concern   Not on file  Social History Narrative   Patient lives at home with her husband [name]).   Patient works full time.   Education CNA    Right handed.   Caffeine Tea ,  Soda.   Social Determinants of Health   Financial Resource Strain: Not on file  Food Insecurity: Not on file  Transportation Needs: Not on file  Physical Activity: Not on file  Stress: Not on file  Social Connections: Not on file     FAMILY HISTORY:  We obtained a detailed, 4-generation family history.  Significant diagnoses are listed below: Family History  Problem Relation Age of Onset   Diabetes Father    Cancer Father        brain tumor   Depression Father    Depression Sister    Alcohol abuse Brother    Depression Brother    Leukemia Maternal Aunt    Prostate cancer Maternal Uncle    Breast cancer Paternal Aunt        dx >50   Heart disease Maternal Grandmother    Heart attack Maternal Grandmother    Stroke Maternal Grandmother    Parkinson's disease Maternal Grandfather    Leukemia Paternal Grandmother    Diabetes Paternal Grandfather    Breast cancer Cousin    Brain cancer Niece        dx under 2   Sheryl Perez has 2 sons, 1 daughter, no history of cancer. She has 1 brother, 1 sister, no cancer. Her brother's daughter had brain cancer diagnosed under 27 and is doing well now.   Sheryl Perez mother died in her late 29s with no history of cancer. Patient had 11 maternal aunts/uncles. Two were adopted in to the family. One aunt had leukemia. An uncle had prostate cancer. A maternal cousin had breast cancer in her 46s. Maternal grandparents died in their 59s.  Sheryl Perez father had brain cancer at 35 and passed from this. Patient had 3 paternal uncles, 3 aunts. One aunt had breast cancer over age 38. Paternal grandmother had leukemia and died over 26.   Sheryl Perez is unaware of previous family history of genetic testing for hereditary cancer risks. Patient's maternal ancestors are of unknown descent, and paternal ancestors are of unkno2n descent. There is no reported Ashkenazi Jewish ancestry. There is no known consanguinity.    GENETIC COUNSELING  ASSESSMENT: Sheryl Perez is a 60 y.o. female with a personal history of pancreatic cancer and family history of cancer which is somewhat suggestive of a hereditary cancer syndrome and predisposition to cancer. We, therefore, discussed and recommended the following at today's visit.   DISCUSSION: We discussed that approximately 10% of pancreatic cancer is hereditary. Most cases of hereditary pancreatic cancer are associated with BRCA1/BRCA2 genes, although there are other genes  associated with hereditary pancreatic cancer as well. BRCA1/BRCA2 are also associated with some cancers we see in her family such as breast and prostate, but there are other genes we can test associated with these cancers as well.  Cancers and risks are gene specific.  We discussed that testing is beneficial for several reasons including knowing if an individual is a candidate for certain targeted therapies, knowing about other cancer risks, identifying potential screening and risk-reduction options that may be appropriate, and to understand if other family members could be at risk for cancer and allow them to undergo genetic testing.   We reviewed the characteristics, features and inheritance patterns of hereditary cancer syndromes. We also discussed genetic testing, including the appropriate family members to test, the process of testing, insurance coverage and turn-around-time for results. We discussed the implications of a negative, positive and/or variant of uncertain significant result. We recommended Sheryl Perez pursue genetic testing for the [facility] CancerNext-Expanded+RNA gene panel.   Based on Sheryl Perez's personal and family history of cancer, she meets medical criteria for genetic testing. Despite that she meets criteria, she may still have an out of pocket cost. We discussed that if her out of pocket cost for testing is over $100, the laboratory will call and confirm whether she wants to proceed with testing.  If the out of  pocket cost of testing is less than $100 she will be billed by the genetic testing laboratory.   PLAN: After considering the risks, benefits, and limitations, Sheryl Perez provided informed consent to pursue genetic testing and the blood sample was sent to [facility] for analysis of the CancerNext-Expanded+RNA Panel. Results should be available within approximately 2-3 weeks' time, at which point they will be disclosed by telephone to Sheryl Perez, as will any additional recommendations warranted by these results. Sheryl Perez will receive a summary of her genetic counseling visit and a copy of her results once available. This information will also be available in Epic.   Sheryl Perez questions were answered to her satisfaction today. Our contact information was provided should additional questions or concerns arise. Thank you for the referral and allowing [place] to share in the care of your patient.   Faith Rogue, MS, [facility] Genetic Counselor [email or web address] Phone: [phone]  The patient was seen for a total of 30 minutes in face-to-face genetic counseling.  Patient was seen with her husband. Dr. Grayland Ormond was available for discussion regarding this case.   _______________________________________________________________________ For Office Staff:  Number of people involved in session: 2 Was an Intern/ student involved with case: no

## 2020-09-17 NOTE — Telephone Encounter (Signed)
Called patient to confirm upcoming appointments for labs/MD/chemo on 8/19. Patient was agreeable to day and time.

## 2020-09-23 ENCOUNTER — Encounter: Payer: Self-pay | Admitting: Oncology

## 2020-09-25 NOTE — Progress Notes (Signed)
This encounter was created in error - please disregard. This encounter was created in error - please disregard. This encounter was created in error - please disregard. 

## 2020-09-26 ENCOUNTER — Inpatient Hospital Stay: Payer: BC Managed Care – PPO

## 2020-09-26 ENCOUNTER — Encounter: Payer: Self-pay | Admitting: Oncology

## 2020-09-26 ENCOUNTER — Other Ambulatory Visit: Payer: Self-pay | Admitting: *Deleted

## 2020-09-26 ENCOUNTER — Inpatient Hospital Stay (HOSPITAL_BASED_OUTPATIENT_CLINIC_OR_DEPARTMENT_OTHER): Payer: BC Managed Care – PPO | Admitting: Oncology

## 2020-09-26 VITALS — BP 101/59 | HR 73

## 2020-09-26 VITALS — BP 116/48 | HR 60 | Temp 98.2°F | Resp 16 | Ht 60.0 in | Wt 113.3 lb

## 2020-09-26 DIAGNOSIS — Z95828 Presence of other vascular implants and grafts: Secondary | ICD-10-CM

## 2020-09-26 DIAGNOSIS — Z833 Family history of diabetes mellitus: Secondary | ICD-10-CM | POA: Diagnosis not present

## 2020-09-26 DIAGNOSIS — T451X5A Adverse effect of antineoplastic and immunosuppressive drugs, initial encounter: Secondary | ICD-10-CM

## 2020-09-26 DIAGNOSIS — F319 Bipolar disorder, unspecified: Secondary | ICD-10-CM | POA: Diagnosis not present

## 2020-09-26 DIAGNOSIS — Z79899 Other long term (current) drug therapy: Secondary | ICD-10-CM | POA: Diagnosis not present

## 2020-09-26 DIAGNOSIS — Z87891 Personal history of nicotine dependence: Secondary | ICD-10-CM | POA: Diagnosis not present

## 2020-09-26 DIAGNOSIS — Z5111 Encounter for antineoplastic chemotherapy: Secondary | ICD-10-CM

## 2020-09-26 DIAGNOSIS — D6481 Anemia due to antineoplastic chemotherapy: Secondary | ICD-10-CM | POA: Diagnosis not present

## 2020-09-26 DIAGNOSIS — R634 Abnormal weight loss: Secondary | ICD-10-CM | POA: Diagnosis not present

## 2020-09-26 DIAGNOSIS — C259 Malignant neoplasm of pancreas, unspecified: Secondary | ICD-10-CM | POA: Diagnosis not present

## 2020-09-26 DIAGNOSIS — Z823 Family history of stroke: Secondary | ICD-10-CM | POA: Diagnosis not present

## 2020-09-26 DIAGNOSIS — Z818 Family history of other mental and behavioral disorders: Secondary | ICD-10-CM | POA: Diagnosis not present

## 2020-09-26 DIAGNOSIS — Z808 Family history of malignant neoplasm of other organs or systems: Secondary | ICD-10-CM | POA: Diagnosis not present

## 2020-09-26 DIAGNOSIS — C257 Malignant neoplasm of other parts of pancreas: Secondary | ICD-10-CM | POA: Diagnosis not present

## 2020-09-26 DIAGNOSIS — Z8269 Family history of other diseases of the musculoskeletal system and connective tissue: Secondary | ICD-10-CM | POA: Diagnosis not present

## 2020-09-26 DIAGNOSIS — E876 Hypokalemia: Secondary | ICD-10-CM

## 2020-09-26 DIAGNOSIS — Z811 Family history of alcohol abuse and dependence: Secondary | ICD-10-CM | POA: Diagnosis not present

## 2020-09-26 DIAGNOSIS — Z803 Family history of malignant neoplasm of breast: Secondary | ICD-10-CM | POA: Diagnosis not present

## 2020-09-26 DIAGNOSIS — R5383 Other fatigue: Secondary | ICD-10-CM | POA: Diagnosis not present

## 2020-09-26 DIAGNOSIS — R1013 Epigastric pain: Secondary | ICD-10-CM | POA: Diagnosis not present

## 2020-09-26 LAB — COMPREHENSIVE METABOLIC PANEL
ALT: 23 U/L (ref 0–44)
AST: 22 U/L (ref 15–41)
Albumin: 3.7 g/dL (ref 3.5–5.0)
Alkaline Phosphatase: 62 U/L (ref 38–126)
Anion gap: 8 (ref 5–15)
BUN: 8 mg/dL (ref 6–20)
CO2: 26 mmol/L (ref 22–32)
Calcium: 8.9 mg/dL (ref 8.9–10.3)
Chloride: 105 mmol/L (ref 98–111)
Creatinine, Ser: 0.69 mg/dL (ref 0.44–1.00)
GFR, Estimated: >60 mL/min (ref >60)
Glucose, Bld: 107 mg/dL — ABNORMAL HIGH (ref 70–99)
Potassium: 3.3 mmol/L — ABNORMAL LOW (ref 3.5–5.1)
Sodium: 139 mmol/L (ref 135–145)
Total Bilirubin: 0.5 mg/dL (ref 0.3–1.2)
Total Protein: 6.7 g/dL (ref 6.5–8.1)

## 2020-09-26 LAB — CBC WITH DIFFERENTIAL/PLATELET
Abs Immature Granulocytes: 0.01 10*3/uL (ref 0.00–0.07)
Basophils Absolute: 0 10*3/uL (ref 0.0–0.1)
Basophils Relative: 0 %
Eosinophils Absolute: 0.2 10*3/uL (ref 0.0–0.5)
Eosinophils Relative: 3 %
HCT: 30.4 % — ABNORMAL LOW (ref 36.0–46.0)
Hemoglobin: 10.2 g/dL — ABNORMAL LOW (ref 12.0–15.0)
Immature Granulocytes: 0 %
Lymphocytes Relative: 20 %
Lymphs Abs: 1 10*3/uL (ref 0.7–4.0)
MCH: 32.8 pg (ref 26.0–34.0)
MCHC: 33.6 g/dL (ref 30.0–36.0)
MCV: 97.7 fL (ref 80.0–100.0)
Monocytes Absolute: 0.5 10*3/uL (ref 0.1–1.0)
Monocytes Relative: 10 %
Neutro Abs: 3.6 10*3/uL (ref 1.7–7.7)
Neutrophils Relative %: 67 %
Platelets: 277 10*3/uL (ref 150–400)
RBC: 3.11 MIL/uL — ABNORMAL LOW (ref 3.87–5.11)
RDW: 13 % (ref 11.5–15.5)
WBC: 5.3 10*3/uL (ref 4.0–10.5)
nRBC: 0 % (ref 0.0–0.2)

## 2020-09-26 MED ORDER — SODIUM CHLORIDE 0.9% FLUSH
10.0000 mL | Freq: Once | INTRAVENOUS | Status: AC
  Administered 2020-09-26: 10 mL via INTRAVENOUS
  Filled 2020-09-26: qty 10

## 2020-09-26 MED ORDER — SODIUM CHLORIDE 0.9 % IV SOLN
150.0000 mg | Freq: Once | INTRAVENOUS | Status: AC
  Administered 2020-09-26: 150 mg via INTRAVENOUS
  Filled 2020-09-26: qty 150

## 2020-09-26 MED ORDER — DEXTROSE 5 % IV SOLN
Freq: Once | INTRAVENOUS | Status: AC
  Filled 2020-09-26: qty 250

## 2020-09-26 MED ORDER — SODIUM CHLORIDE 0.9 % IV SOLN
150.0000 mg/m2 | Freq: Once | INTRAVENOUS | Status: AC
  Administered 2020-09-26: 220 mg via INTRAVENOUS
  Filled 2020-09-26: qty 10

## 2020-09-26 MED ORDER — SODIUM CHLORIDE 0.9 % IV SOLN
600.0000 mg | Freq: Once | INTRAVENOUS | Status: AC
  Administered 2020-09-26: 600 mg via INTRAVENOUS
  Filled 2020-09-26: qty 30

## 2020-09-26 MED ORDER — POTASSIUM CHLORIDE CRYS ER 20 MEQ PO TBCR: 20.0000 meq | EXTENDED_RELEASE_TABLET | Freq: Every day | ORAL | 0 refills | Status: DC

## 2020-09-26 MED ORDER — HEPARIN SOD (PORK) LOCK FLUSH 100 UNIT/ML IV SOLN
500.0000 [IU] | Freq: Once | INTRAVENOUS | Status: DC
  Filled 2020-09-26: qty 5

## 2020-09-26 MED ORDER — SODIUM CHLORIDE 0.9 % IV SOLN
10.0000 mg | Freq: Once | INTRAVENOUS | Status: AC
  Administered 2020-09-26: 10 mg via INTRAVENOUS
  Filled 2020-09-26: qty 10

## 2020-09-26 MED ORDER — SODIUM CHLORIDE 0.9 % IV SOLN
2400.0000 mg/m2 | INTRAVENOUS | Status: DC
  Administered 2020-09-26: 3600 mg via INTRAVENOUS
  Filled 2020-09-26: qty 72

## 2020-09-26 MED ORDER — OXALIPLATIN CHEMO INJECTION 100 MG/20ML
65.0000 mg/m2 | Freq: Once | INTRAVENOUS | Status: AC
  Administered 2020-09-26: 100 mg via INTRAVENOUS
  Filled 2020-09-26: qty 20

## 2020-09-26 MED ORDER — PALONOSETRON HCL INJECTION 0.25 MG/5ML
0.2500 mg | Freq: Once | INTRAVENOUS | Status: AC
  Administered 2020-09-26: 0.25 mg via INTRAVENOUS
  Filled 2020-09-26: qty 5

## 2020-09-26 MED ORDER — SODIUM CHLORIDE 0.9 % IV SOLN: 2400.0000 mg/m2 | INTRAVENOUS | Status: DC

## 2020-09-26 MED ORDER — SODIUM CHLORIDE 0.9 % IV SOLN
Freq: Once | INTRAVENOUS | Status: AC
  Filled 2020-09-26: qty 250

## 2020-09-26 MED ORDER — ATROPINE SULFATE 1 MG/ML IJ SOLN
0.5000 mg | Freq: Once | INTRAMUSCULAR | Status: AC | PRN
  Administered 2020-09-26: 0.5 mg via INTRAVENOUS
  Filled 2020-09-26: qty 1

## 2020-09-26 NOTE — Patient Instructions (Signed)
[facility] ONCOLOGY  Discharge Instructions: Thank you for choosing [facility] to provide your oncology and hematology care.  If you have a lab appointment with the [facility], please go directly to the [facility] and check in at the registration area.  Wear comfortable clothing and clothing appropriate for easy access to any Portacath or PICC line.   We strive to give you quality time with your provider. You may need to reschedule your appointment if you arrive late (15 or more minutes).  Arriving late affects you and other patients whose appointments are after yours.  Also, if you miss three or more appointments without notifying the office, you may be dismissed from the clinic at the provider's discretion.      For prescription refill requests, have your pharmacy contact our office and allow 72 hours for refills to be completed.    Today you received the following chemotherapy and/or immunotherapy agents Oxaliplatin, Irinotecan, Adrucil    To help prevent nausea and vomiting after your treatment, we encourage you to take your nausea medication as directed.  BELOW ARE SYMPTOMS THAT SHOULD BE REPORTED IMMEDIATELY: *FEVER GREATER THAN 100.4 F (38 C) OR HIGHER *CHILLS OR SWEATING *NAUSEA AND VOMITING THAT IS NOT CONTROLLED WITH YOUR NAUSEA MEDICATION *UNUSUAL SHORTNESS OF BREATH *UNUSUAL BRUISING OR BLEEDING *URINARY PROBLEMS (pain or burning when urinating, or frequent urination) *BOWEL PROBLEMS (unusual diarrhea, constipation, pain near the anus) TENDERNESS IN MOUTH AND THROAT WITH OR WITHOUT PRESENCE OF ULCERS (sore throat, sores in mouth, or a toothache) UNUSUAL RASH, SWELLING OR PAIN  UNUSUAL VAGINAL DISCHARGE OR ITCHING   Items with * indicate a potential emergency and should be followed up as soon as possible or go to the Emergency Department if any problems should occur.  Please show the CHEMOTHERAPY ALERT CARD or IMMUNOTHERAPY  ALERT CARD at check-in to the Emergency Department and triage nurse.  Should you have questions after your visit or need to cancel or reschedule your appointment, please contact [facility]  [phone] and follow the prompts.  Office hours are 8:00 a.m. to 4:30 p.m. Monday - Friday. Please note that voicemails left after 4:00 p.m. may not be returned until the following business day.  We are closed weekends and major holidays. You have access to a nurse at all times for urgent questions. Please call the main number to the clinic [phone] and follow the prompts.  For any non-urgent questions, you may also contact your provider using MyChart. We now offer e-Visits for anyone 17 and older to request care online for non-urgent symptoms. For details visit [email or web address].   Also download the MyChart app! Go to the app store, search "MyChart", open the app, select [facility], and log in with your MyChart username and password.  Due to Covid, a mask is required upon entering the hospital/clinic. If you do not have a mask, one will be given to you upon arrival. For doctor visits, patients may have 1 support person aged 21 or older with them. For treatment visits, patients cannot have anyone with them due to current Covid guidelines and our immunocompromised population.

## 2020-09-26 NOTE — Progress Notes (Signed)
Pt needs letter for work. It was done and given to her

## 2020-09-26 NOTE — Progress Notes (Signed)
Nutrition Assessment   Reason for Assessment:  Patient identified via [facility] clinic, weight loss, pancreatic cancer   ASSESSMENT:  60 year old female with stage I pancreatic cancer.  Past medical history of bipolar, depression, IBW, COPD, cholecystectomy.  Patient receiving folfirinox, neoadjuvant.    Met with patient during infusion.  Patient reports that her appetite has been normal.  Eats scrambles for breakfast.  Mid am eats fruit cup.Lunch she has been eating ramen noodles.  Supper last night she ate salisbury steak (1 piece) with rice, greens, lima beans (4 tbsp of all vegetables).  HS snack ate 1/2 piece of steak and more rice.  Sometime will drink boost original shake for snack.  Usually drinks about 1 a day.  Denies nausea, diarrhea or constipation.     Medications: MVI, zofran, Vit C, decadron, pepcid, ferrous sulfate,    Labs: K 3.3, glucose 107   Anthropometrics:   Height: 60 inches Weight: 113 lb 4.8 oz today UBW: 120-125 lb BMI: 22  6% weight loss in the last ~2 months  Estimated Energy Needs  Kcals: 1275-1500 Protein: 61-77 g Fluid: 1.2 L   NUTRITION DIAGNOSIS: Unintentional weight loss related to cancer as evidenced by 6% weight loss in the last ~2 months   INTERVENTION:  Discussed importance of good nutrition during treatment and weight maintenance.   Encouraged trying 350 calorie or higher shake. Ensure plus, ensure complete and coupons given today Discussed ways to add calories to current meal pattern.  Handout given Contact information given   MONITORING, EVALUATION, GOAL: weight trends, intake   Next Visit: Friday, Sept 9 during infusion  Airika Alkhatib B. Zenia Resides, [place], [place] Registered Dietitian [phone] (mobile)

## 2020-09-26 NOTE — Progress Notes (Signed)
I connected with Sheryl Perez on 09/26/20 at  9:15 AM EDT by video enabled telemedicine visit and verified that I am speaking with the correct person using two identifiers.   I discussed the limitations, risks, security and privacy concerns of performing an evaluation and management service by telemedicine and the availability of in-person appointments. I also discussed with the patient that there may be a patient responsible charge related to this service. The patient expressed understanding and agreed to proceed.  Other persons participating in the visit and their role in the encounter:  none  Patient's location:  cancer center Provider's location:  home  Chief Complaint: On treatment assessment prior to cycle 2 of modified FOLFIRINOX chemotherapy  History of present illness: patient is a 60 year old female with a history of irritable bowel disease for which she follows up with [facility].  She has undergone EGD and colonoscopy with them in the past.  More recently patient was complaining of left upper quadrant and epigastric abdominal pain which causes sudden episodes of abdominal spasms and was prescribed as needed tramadol for it.  These episodes of pain are unrelated to food and are dull aching or throbbing.  She has undergone cholecystectomy in the past.  She underwent CT abdomen and pelvis with contrast in the ER on 08/04/2020 Which did not reveal any acute pathology which showed a possible hypoechoic 1.9 cm right hepatic lobe lesion.  This was followed by an MRI which showed no discrete lesion in the liver and the area of concern noted on CT scan was more likely to represent variable fat deposition.  However there was loss of normal T1 signal in the peripheral pancreas and a potential small pancreatic lesion suspicious for early pancreatic adenocarcinoma in the neck of the pancreas.     Patient underwent EUS by Dr. Mont Dutton which showed an irregular mass in the pancreatic neck measuring 15 x 11  mm.  FNA showed adenocarcinoma.  Pancreatic duct measured 1.1 mm in the head with abrupt dilatation in the region of the mass to 4.1 mm.  No abnormality noted in the common bile duct and hepatic duct.  Celiac region showed no significant endosonographic abnormality.  No lymphadenopathy was seen.  CA 19-9 mildly elevated at 35   Patient was seen by Dr. Hyman Hopes from pancreaticobiliary surgery at [facility] for surgical opinion.  Although patient has a small lesion involving the pancreatic neck, there was a concern for tumor extension towards the splenic vein and narrowing of portal splenic confluence.  Neoadjuvant chemotherapy for 3 months was therefore favored  Interval history She has tolerated chemotherapy well without significant nausea or vomiting.  Mild diarrhea which was self-limited and controlled with Imodium.However she has lost about 10 pounds in the last 3 to 4 weeks.reports ongoing fatigue.    Review of Systems  Constitutional:  Positive for malaise/fatigue. Negative for chills, fever and weight loss.  HENT:  Negative for congestion, ear discharge and nosebleeds.   Eyes:  Negative for blurred vision.  Respiratory:  Negative for cough, hemoptysis, sputum production, shortness of breath and wheezing.   Cardiovascular:  Negative for chest pain, palpitations, orthopnea and claudication.  Gastrointestinal:  Negative for abdominal pain, blood in stool, constipation, diarrhea, heartburn, melena, nausea and vomiting.  Genitourinary:  Negative for dysuria, flank pain, frequency, hematuria and urgency.  Musculoskeletal:  Negative for back pain, joint pain and myalgias.  Skin:  Negative for rash.  Neurological:  Negative for dizziness, tingling, focal weakness, seizures, weakness and headaches.  Endo/Heme/Allergies:  Does not bruise/bleed easily.  Psychiatric/Behavioral:  Negative for depression and suicidal ideas. The patient does not have insomnia.    Allergies  Allergen Reactions   Sulfacetamide  Sodium Swelling    Diarrhea,dizziness, tongue swelling   Other Other (See Comments) and Swelling    PT states that anything with the sides effects "may cause rash" she cannot take due to her psoriasis, it will make the patches on her hand so thick she can't use her hands.  Diarrhea,dizziness, tongue swelling   Oxycontin [Oxycodone Hcl] Nausea And Vomiting   Sulfa Antibiotics Swelling    Diarrhea,dizziness, tongue swelling   Abilify [Aripiprazole] Other (See Comments)   Cephalosporins Rash   Erythromycin Other (See Comments)    Abd. pain Abd. pain Other reaction(s): Other (See Comments) Abd. 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    Bipolar disorder ([place])    Chest pain, unspecified    Chronic low back pain 10/23/2013   Chronic UTI    COPD (chronic obstructive pulmonary disease) (HCC)    Depression    Dysfunction of eustachian tube    Enlargement of lymph nodes    Family history of brain cancer    Family history of breast cancer    Family history of prostate cancer    History of Bell's palsy Right   Injury, other and unspecified, knee, leg, ankle, and foot    MRSA (methicillin resistant staph aureus) culture positive 07/11/2014   abscess   Other specified disease of hair and hair follicles    Rectal prolapse    Unspecified symptom associated with female genital organs     Past Surgical History:  Procedure Laterality Date   ABDOMINAL HYSTERECTOMY     bladder tack  2010   CHOLECYSTECTOMY  2002   FOOT SURGERY Bilateral 2004   bunion   GANGLION CYST EXCISION  2002   INCISION AND DRAINAGE ABSCESS  04-16-15   PARTIAL HYSTERECTOMY  2010   uterus removed   PORTA CATH INSERTION N/A 09/11/2020   Procedure: PORTA CATH INSERTION;  Surgeon: Algernon Huxley, MD;   Location: [facility];  Service: Cardiovascular;  Laterality: N/A;    Social History   Socioeconomic History   Marital status: Married    Spouse name: Not on file   Number of children: 3   Years of education: hs   Highest education level: Some college, no degree  Occupational History   Not on file  Tobacco Use   Smoking status: Former    Packs/day: 0.50    Years: 40.00    Pack years: 20.00    Types: Cigarettes    Quit date: 07/14/2020    Years since quitting: 0.2   Smokeless tobacco: Never  Vaping Use   Vaping Use: Never used  Substance and Sexual Activity   Alcohol use: No    Alcohol/week: 1.0 standard drink    Types: 1 Glasses of wine per week    Comment: occasional   Drug use: Yes    Types: Marijuana    Comment: last used 05-20-15   Sexual activity: Yes    Birth control/protection: None  Other Topics Concern   Not on file  Social History Narrative   Patient lives  at home with her husband [name]).   Patient works full time.   Education CNA    Right handed.   Caffeine Tea , Soda.   Social Determinants of Health   Financial Resource Strain: Not on file  Food Insecurity: Not on file  Transportation Needs: Not on file  Physical Activity: Not on file  Stress: Not on file  Social Connections: Not on file  Intimate Partner Violence: Not on file    Family History  Problem Relation Age of Onset   Diabetes Father    Cancer Father        brain tumor   Depression Father    Depression Sister    Alcohol abuse Brother    Depression Brother    Leukemia Maternal Aunt    Prostate cancer Maternal Uncle    Breast cancer Paternal Aunt        dx >50   Heart disease Maternal Grandmother    Heart attack Maternal Grandmother    Stroke Maternal Grandmother    Parkinson's disease Maternal Grandfather    Leukemia Paternal Grandmother    Diabetes Paternal Grandfather    Breast cancer Cousin    Brain cancer Niece        dx under 17     Current Outpatient  Medications:    albuterol (VENTOLIN HFA) 108 (90 Base) MCG/ACT inhaler, Inhale 2 puffs into the lungs every 4 (four) hours as needed for wheezing or shortness of breath., Disp: 8 g, Rfl: 4   ALPRAZolam (XANAX) 0.25 MG tablet, Take by mouth., Disp: , Rfl:    ALPRAZolam (XANAX) 0.5 MG tablet, Take 1 tablet (0.5 mg total) by mouth 2 (two) times daily as needed for anxiety., Disp: 20 tablet, Rfl: 1   Ascorbic Acid (VITAMIN C) 1000 MG tablet, Take 1,000 mg by mouth daily., Disp: , Rfl:    dexamethasone (DECADRON) 4 MG tablet, Take 2 tablets (8 mg total) by mouth daily. Start the day after chemotherapy for 3 days. Take with food., Disp: 8 tablet, Rfl: 5   divalproex (DEPAKOTE) 250 MG DR tablet, Take 1 tablet (250 mg total) by mouth 2 (two) times daily., Disp: 60 tablet, Rfl: 1   lidocaine-prilocaine (EMLA) cream, Apply to affected area once, Disp: 30 g, Rfl: 3   loratadine (CLARITIN) 10 MG tablet, TAKE 1 TABLET BY MOUTH EVERY DAY, Disp: 90 tablet, Rfl: 1   Multiple Vitamin (MULTIVITAMIN) tablet, Take 1 tablet by mouth daily., Disp: , Rfl:    potassium chloride SA (KLOR-CON) 20 MEQ tablet, Take 1 tablet (20 mEq total) by mouth daily., Disp: 21 tablet, Rfl: 0   propranolol (INDERAL) 20 MG tablet, Take 1 tablet (20 mg total) by mouth 3 (three) times daily., Disp: 270 tablet, Rfl: 1   QUEtiapine (SEROQUEL) 50 MG tablet, Take 1 tablet (50 mg total) by mouth at bedtime as needed., Disp: 90 tablet, Rfl: 0   triamcinolone cream (KENALOG) 0.1 %, Apply 1 application topically 2 (two) times daily., Disp: 30 g, Rfl: 0   cyclobenzaprine (FLEXERIL) 5 MG tablet, Take 5 mg by mouth 2 (two) times daily at 10 AM and 5 PM.  (Patient not taking: No sig reported), Disp: , Rfl:    famotidine (PEPCID) 20 MG tablet, Take by mouth. (Patient not taking: No sig reported), Disp: , Rfl:    ferrous sulfate 325 (65 FE) MG tablet, Take 325 mg by mouth 2 (two) times daily with a meal., Disp: , Rfl:    fluticasone (FLONASE) 50 MCG/ACT  nasal spray, Place 1 spray into both nostrils daily., Disp: 11.1 mL, Rfl: 2   loperamide (IMODIUM A-D) 2 MG tablet, Take 2 at onset of diarrhea, then 1 every 2hrs until 12hr without a BM. May take 2 tab every 4hrs at bedtime. If diarrhea recurs repeat. (Patient not taking: Reported on 09/26/2020), Disp: 100 tablet, Rfl: 1   naproxen sodium (ALEVE) 220 MG tablet, Take by mouth., Disp: , Rfl:    ondansetron (ZOFRAN) 8 MG tablet, Take 1 tablet (8 mg total) by mouth 2 (two) times daily as needed. Start on day 3 after chemotherapy. (Patient not taking: Reported on 09/26/2020), Disp: 30 tablet, Rfl: 1   ondansetron (ZOFRAN-ODT) 4 MG disintegrating tablet, , Disp: , Rfl:    pantoprazole (PROTONIX) 40 MG tablet, , Disp: , Rfl:    prochlorperazine (COMPAZINE) 10 MG tablet, Take 1 tablet (10 mg total) by mouth every 6 (six) hours as needed (Nausea or vomiting). (Patient not taking: Reported on 09/26/2020), Disp: 30 tablet, Rfl: 1 No current facility-administered medications for this visit.  Facility-Administered Medications Ordered in Other Visits:    fluorouracil (ADRUCIL) 3,600 mg in sodium chloride 0.9 % 78 mL chemo infusion, 2,400 mg/m2 (Treatment Plan Recorded), Intravenous, 1 day or 1 dose, Sindy Guadeloupe, MD, 3,600 mg at 09/26/20 1623   heparin lock flush 100 unit/mL, 500 Units, Intravenous, Once, Sindy Guadeloupe, MD  PERIPHERAL VASCULAR CATHETERIZATION  Result Date: 09/11/2020 See surgical note for result.   No images are attached to the encounter.   CMP Latest Ref Rng & Units 09/26/2020  Glucose 70 - 99 mg/dL 107(H)  BUN 6 - 20 mg/dL 8  Creatinine 0.44 - 1.00 mg/dL 0.69  Sodium 135 - 145 mmol/L 139  Potassium 3.5 - 5.1 mmol/L 3.3(L)  Chloride 98 - 111 mmol/L 105  CO2 22 - 32 mmol/L 26  Calcium 8.9 - 10.3 mg/dL 8.9  Total Protein 6.5 - 8.1 g/dL 6.7  Total Bilirubin 0.3 - 1.2 mg/dL 0.5  Alkaline Phos 38 - 126 U/L 62  AST 15 - 41 U/L 22  ALT 0 - 44 U/L 23   CBC Latest Ref Rng & Units  09/26/2020  WBC 4.0 - 10.5 K/uL 5.3  Hemoglobin 12.0 - 15.0 g/dL 10.2(L)  Hematocrit 36.0 - 46.0 % 30.4(L)  Platelets 150 - 400 K/uL 277     Observation/objective: Appears in no acute distress over video visit today.  Breathing is nonlabored  Assessment and plan: Patient is a 60 year old female with stage I pancreatic neck adenocarcinoma cT1 cN0 M0 borderline resectable here for on treatment assessment prior to cycle 2 of neoadjuvant modified FOLFIRINOX chemotherapy   Counts okay to proceed with cycle 2 of modified FOLFIRINOX chemotherapy today with pump disconnect on Monday.  She is out of town in 2 weeks and I will see her back in 3 weeks with CBC with differential CMP CA 19-9 ferritin and iron studies B12 and folate for cycle 3.  Weight loss: I have encouraged her to try Ensure and I will refer her to dietitian as well.  Normocytic anemia: Likely secondary to chemotherapy.  Continue to monitor  Hypokalemia: We will send prescription for oral potassium  Follow-up instructions:as above  I discussed the assessment and treatment plan with the patient. The patient was provided an opportunity to ask questions and all were answered. The patient agreed with the plan and demonstrated an understanding of the instructions.   The patient was advised to call back or seek an in-person  evaluation if the symptoms worsen or if the condition fails to improve as anticipated.    Visit Diagnosis: 1. Encounter for antineoplastic chemotherapy   2. Pancreatic adenocarcinoma ([place])   3. Anemia due to antineoplastic chemotherapy   4. Weight loss   5. Hypokalemia     Dr. Randa Evens, MD, MPH [facility] at [facility] Tel- [phone] 09/26/2020 7:57 PM

## 2020-09-29 ENCOUNTER — Inpatient Hospital Stay: Payer: BC Managed Care – PPO

## 2020-09-29 DIAGNOSIS — C259 Malignant neoplasm of pancreas, unspecified: Secondary | ICD-10-CM

## 2020-09-29 DIAGNOSIS — F319 Bipolar disorder, unspecified: Secondary | ICD-10-CM | POA: Diagnosis not present

## 2020-09-29 DIAGNOSIS — Z87891 Personal history of nicotine dependence: Secondary | ICD-10-CM | POA: Diagnosis not present

## 2020-09-29 DIAGNOSIS — Z8269 Family history of other diseases of the musculoskeletal system and connective tissue: Secondary | ICD-10-CM | POA: Diagnosis not present

## 2020-09-29 DIAGNOSIS — Z803 Family history of malignant neoplasm of breast: Secondary | ICD-10-CM | POA: Diagnosis not present

## 2020-09-29 DIAGNOSIS — Z811 Family history of alcohol abuse and dependence: Secondary | ICD-10-CM | POA: Diagnosis not present

## 2020-09-29 DIAGNOSIS — Z833 Family history of diabetes mellitus: Secondary | ICD-10-CM | POA: Diagnosis not present

## 2020-09-29 DIAGNOSIS — C257 Malignant neoplasm of other parts of pancreas: Secondary | ICD-10-CM | POA: Diagnosis not present

## 2020-09-29 DIAGNOSIS — Z818 Family history of other mental and behavioral disorders: Secondary | ICD-10-CM | POA: Diagnosis not present

## 2020-09-29 DIAGNOSIS — Z823 Family history of stroke: Secondary | ICD-10-CM | POA: Diagnosis not present

## 2020-09-29 DIAGNOSIS — Z79899 Other long term (current) drug therapy: Secondary | ICD-10-CM | POA: Diagnosis not present

## 2020-09-29 DIAGNOSIS — Z808 Family history of malignant neoplasm of other organs or systems: Secondary | ICD-10-CM | POA: Diagnosis not present

## 2020-09-29 DIAGNOSIS — Z5111 Encounter for antineoplastic chemotherapy: Secondary | ICD-10-CM | POA: Diagnosis not present

## 2020-09-29 DIAGNOSIS — R5383 Other fatigue: Secondary | ICD-10-CM | POA: Diagnosis not present

## 2020-09-29 DIAGNOSIS — R1013 Epigastric pain: Secondary | ICD-10-CM | POA: Diagnosis not present

## 2020-09-29 MED ORDER — HEPARIN SOD (PORK) LOCK FLUSH 100 UNIT/ML IV SOLN
500.0000 [IU] | Freq: Once | INTRAVENOUS | Status: AC | PRN
  Administered 2020-09-29: 500 [IU]
  Filled 2020-09-29: qty 5

## 2020-09-29 MED ORDER — HEPARIN SOD (PORK) LOCK FLUSH 100 UNIT/ML IV SOLN
INTRAVENOUS | Status: AC
  Filled 2020-09-29: qty 5

## 2020-09-29 MED ORDER — SODIUM CHLORIDE 0.9% FLUSH
10.0000 mL | INTRAVENOUS | Status: DC | PRN
  Administered 2020-09-29: 10 mL
  Filled 2020-09-29: qty 10

## 2020-09-29 NOTE — Progress Notes (Signed)
Patient presented for pump dc appt. Pump still has 1.6 ml left. Patient offered to stay to finish amount but patient denies ans states that she would rather disconnect it.

## 2020-10-11 ENCOUNTER — Other Ambulatory Visit: Payer: Self-pay | Admitting: Oncology

## 2020-10-13 DIAGNOSIS — C259 Malignant neoplasm of pancreas, unspecified: Secondary | ICD-10-CM | POA: Diagnosis not present

## 2020-10-14 ENCOUNTER — Ambulatory Visit: Payer: Self-pay | Admitting: Licensed Clinical Social Worker

## 2020-10-14 ENCOUNTER — Encounter: Payer: Self-pay | Admitting: Licensed Clinical Social Worker

## 2020-10-14 ENCOUNTER — Telehealth: Payer: Self-pay | Admitting: Licensed Clinical Social Worker

## 2020-10-14 DIAGNOSIS — Z1379 Encounter for other screening for genetic and chromosomal anomalies: Secondary | ICD-10-CM

## 2020-10-14 DIAGNOSIS — Z803 Family history of malignant neoplasm of breast: Secondary | ICD-10-CM

## 2020-10-14 DIAGNOSIS — Z8042 Family history of malignant neoplasm of prostate: Secondary | ICD-10-CM

## 2020-10-14 DIAGNOSIS — C259 Malignant neoplasm of pancreas, unspecified: Secondary | ICD-10-CM

## 2020-10-14 DIAGNOSIS — Z808 Family history of malignant neoplasm of other organs or systems: Secondary | ICD-10-CM

## 2020-10-14 NOTE — Telephone Encounter (Signed)
Revealed negative genetic testing.  Revealed that a VUS in [place] was identified. This normal result is reassuring and indicates that it is unlikely Ms. Barto's cancer is due to a hereditary cause.  It is unlikely that there is an increased risk of another cancer due to a mutation in one of these genes.  However, genetic testing is not perfect, and cannot definitively rule out a hereditary cause.  It will be important for her to keep in contact with genetics to learn if any additional testing may be needed in the future.

## 2020-10-14 NOTE — Progress Notes (Signed)
HPI:  Ms. Nowland was previously seen in the [facility] clinic due to a personal and family history of cancer and concerns regarding a hereditary predisposition to cancer. Please refer to our prior cancer genetics clinic note for more information regarding our discussion, assessment and recommendations, at the time. Ms. Eagen recent genetic test results were disclosed to her, as were recommendations warranted by these results. These results and recommendations are discussed in more detail below.  CANCER HISTORY:  Oncology History  Pancreatic adenocarcinoma ([place])  09/01/2020 Initial Diagnosis   Pancreatic adenocarcinoma ([place])   09/01/2020 Cancer Staging   Staging form: Exocrine Pancreas, AJCC 8th Edition - Clinical stage from 09/01/2020: Stage IA (cT1, cN0, cM0) - Signed by Sindy Guadeloupe, MD on 09/01/2020 Total positive nodes: 0   09/12/2020 -  Chemotherapy    Patient is on Treatment Plan: PANCREAS MODIFIED FOLFIRINOX Q14D X 4 CYCLES        Genetic Testing   Negative genetic testing. No pathogenic variants identified on the [facility] CancerNext-Expanded+RNA Panel. VUS in [place] identified. The report date is 10/09/2020.  The CancerNext-Expanded + RNAinsight gene panel offered by [employer] and includes sequencing and rearrangement analysis for the following 77 genes: IP, ALK, APC*, ATM*, AXIN2, BAP1, BARD1, BLM, BMPR1A, BRCA1*, BRCA2*, BRIP1*, CDC73, CDH1*,CDK4, CDKN1B, CDKN2A, CHEK2*, CTNNA1, DICER1, FANCC, FH, FLCN, GALNT12, KIF1B, LZTR1, MAX, MEN1, MET, MLH1*, MSH2*, MSH3, MSH6*, MUTYH*, NBN, NF1*, NF2, NTHL1, PALB2*, PHOX2B, PMS2*, POT1, PRKAR1A, PTCH1, PTEN*, RAD51C*, RAD51D*,RB1, RECQL, RET, SDHA, SDHAF2, SDHB, SDHC, SDHD, SMAD4, SMARCA4, SMARCB1, SMARCE1, STK11, SUFU, TMEM127, TP53*,TSC1, TSC2, VHL and XRCC2 (sequencing and deletion/duplication); EGFR, EGLN1, HOXB13, KIT, MITF, PDGFRA, POLD1 and POLE (sequencing only); EPCAM and GREM1 (deletion/duplication only).      FAMILY HISTORY:  We obtained a detailed, 4-generation family history.  Significant diagnoses are listed below: Family History  Problem Relation Age of Onset   Diabetes Father    Cancer Father        brain tumor   Depression Father    Depression Sister    Alcohol abuse Brother    Depression Brother    Leukemia Maternal Aunt    Prostate cancer Maternal Uncle    Breast cancer Paternal Aunt        dx >50   Heart disease Maternal Grandmother    Heart attack Maternal Grandmother    Stroke Maternal Grandmother    Parkinson's disease Maternal Grandfather    Leukemia Paternal Grandmother    Diabetes Paternal Grandfather    Breast cancer Cousin    Brain cancer Niece        dx under 61   Ms. Birchler has 2 sons, 1 daughter, no history of cancer. She has 1 brother, 1 sister, no cancer. Her brother's daughter had brain cancer diagnosed under 76 and is doing well now.    Ms. Wellen mother died in her late 47s with no history of cancer. Patient had 11 maternal aunts/uncles. Two were adopted in to the family. One aunt had leukemia. An uncle had prostate cancer. A maternal cousin had breast cancer in her 32s. Maternal grandparents died in their 20s.   Ms. Vialpando father had brain cancer at 64 and passed from this. Patient had 3 paternal uncles, 3 aunts. One aunt had breast cancer over age 6. Paternal grandmother had leukemia and died over 30.    Ms. Obremski is unaware of previous family history of genetic testing for hereditary cancer risks. Patient's maternal ancestors are of unknown descent, and  paternal ancestors are of unkno2n descent. There is no reported Ashkenazi Jewish ancestry. There is no known consanguinity.      GENETIC TEST RESULTS: Genetic testing reported out on 10/09/2020 through the [facility] CancerNext-Expanded+RNA cancer panel found no pathogenic mutations.   The CancerNext-Expanded + RNAinsight gene panel offered by [employer] and includes sequencing and  rearrangement analysis for the following 77 genes: IP, ALK, APC*, ATM*, AXIN2, BAP1, BARD1, BLM, BMPR1A, BRCA1*, BRCA2*, BRIP1*, CDC73, CDH1*,CDK4, CDKN1B, CDKN2A, CHEK2*, CTNNA1, DICER1, FANCC, FH, FLCN, GALNT12, KIF1B, LZTR1, MAX, MEN1, MET, MLH1*, MSH2*, MSH3, MSH6*, MUTYH*, NBN, NF1*, NF2, NTHL1, PALB2*, PHOX2B, PMS2*, POT1, PRKAR1A, PTCH1, PTEN*, RAD51C*, RAD51D*,RB1, RECQL, RET, SDHA, SDHAF2, SDHB, SDHC, SDHD, SMAD4, SMARCA4, SMARCB1, SMARCE1, STK11, SUFU, TMEM127, TP53*,TSC1, TSC2, VHL and XRCC2 (sequencing and deletion/duplication); EGFR, EGLN1, HOXB13, KIT, MITF, PDGFRA, POLD1 and POLE (sequencing only); EPCAM and GREM1 (deletion/duplication only).   The test report has been scanned into EPIC and is located under the Molecular Pathology section of the Results Review tab.  A portion of the result report is included below for reference.     We discussed that because current genetic testing is not perfect, it is possible there may be a gene mutation in one of these genes that current testing cannot detect, but that chance is small.  There could be another gene that has not yet been discovered, or that we have not yet tested, that is responsible for the cancer diagnoses in the family. It is also possible there is a hereditary cause for the cancer in the family that Ms. Kawa did not inherit and therefore was not identified in her testing.  Therefore, it is important to remain in touch with cancer genetics in the future so that we can continue to offer Ms. Knodel the most up to date genetic testing.   Genetic testing did identify a variant of uncertain significance (VUS) in the NTHL1 gene called [id number].[id number].  At this time, it is unknown if this variant is associated with increased cancer risk or if this is a normal finding, but most variants such as this get reclassified to being inconsequential. It should not be used to make medical management decisions. With time, we suspect the lab will determine  the significance of this variant, if any. If we do learn more about it we will try to contact Ms. Pais to discuss it further. However, it is important to stay in touch with [place] periodically and keep the address and phone number up to date.  ADDITIONAL GENETIC TESTING: We discussed with Ms. Hillebrand that her genetic testing was fairly extensive.  If there are genes identified to increase cancer risk that can be analyzed in the future, we would be happy to discuss and coordinate this testing at that time.    CANCER SCREENING RECOMMENDATIONS: Ms. Brinson test result is considered negative (normal).  This means that we have not identified a hereditary cause for her  personal and family history of cancer at this time. Most cancers happen by chance and this negative test suggests that her cancer may fall into this category.    While reassuring, this does not definitively rule out a hereditary predisposition to cancer. It is still possible that there could be genetic mutations that are undetectable by current technology. There could be genetic mutations in genes that have not been tested or identified to increase cancer risk.  Therefore, it is recommended she continue to follow the cancer management and screening guidelines provided by her oncology and  primary healthcare provider.   An individual's cancer risk and medical management are not determined by genetic test results alone. Overall cancer risk assessment incorporates additional factors, including personal medical history, family history, and any available genetic information that may result in a personalized plan for cancer prevention and surveillance.  RECOMMENDATIONS FOR FAMILY MEMBERS:  Relatives in this family might be at some increased risk of developing cancer, over the general population risk, simply due to the family history of cancer.  We recommended female relatives in this family have a yearly mammogram beginning at age 25, or 60 years  younger than the earliest onset of cancer, an annual clinical breast exam, and perform monthly breast self-exams. Female relatives in this family should also have a gynecological exam as recommended by their primary provider.  All family members should be referred for colonoscopy starting at age 70.    It is also possible there is a hereditary cause for the cancer in Ms. Scarfo's family that she did not inherit and therefore was not identified in her.  Based on Ms. Weidler's family history, we recommended her niece with brain cancer under 67 consider having genetic counseling and testing. Ms. Klus will let us know if we can be of any assistance in coordinating genetic counseling and/or testing for these family members.  FOLLOW-UP: Lastly, we discussed with Ms. Bronaugh that cancer genetics is a rapidly advancing field and it is possible that new genetic tests will be appropriate for her and/or her family members in the future. We encouraged her to remain in contact with cancer genetics on an annual basis so we can update her personal and family histories and let her know of advances in cancer genetics that may benefit this family.   Our contact number was provided. Ms. Ladson questions were answered to her satisfaction, and she knows she is welcome to call us at anytime with additional questions or concerns.   Faith Rogue, MS, [facility] Genetic Counselor [email or web address] Phone: [phone]

## 2020-10-17 ENCOUNTER — Inpatient Hospital Stay: Payer: BC Managed Care – PPO

## 2020-10-17 ENCOUNTER — Other Ambulatory Visit: Payer: Self-pay

## 2020-10-17 ENCOUNTER — Encounter: Payer: Self-pay | Admitting: Oncology

## 2020-10-17 ENCOUNTER — Inpatient Hospital Stay: Payer: BC Managed Care – PPO | Attending: Oncology | Admitting: Oncology

## 2020-10-17 VITALS — BP 108/53 | HR 64 | Temp 97.8°F | Resp 20 | Wt 111.6 lb

## 2020-10-17 DIAGNOSIS — Z5189 Encounter for other specified aftercare: Secondary | ICD-10-CM | POA: Diagnosis not present

## 2020-10-17 DIAGNOSIS — Z95828 Presence of other vascular implants and grafts: Secondary | ICD-10-CM

## 2020-10-17 DIAGNOSIS — C257 Malignant neoplasm of other parts of pancreas: Secondary | ICD-10-CM | POA: Insufficient documentation

## 2020-10-17 DIAGNOSIS — Z79899 Other long term (current) drug therapy: Secondary | ICD-10-CM | POA: Insufficient documentation

## 2020-10-17 DIAGNOSIS — Z806 Family history of leukemia: Secondary | ICD-10-CM | POA: Diagnosis not present

## 2020-10-17 DIAGNOSIS — Z833 Family history of diabetes mellitus: Secondary | ICD-10-CM | POA: Diagnosis not present

## 2020-10-17 DIAGNOSIS — D6481 Anemia due to antineoplastic chemotherapy: Secondary | ICD-10-CM | POA: Diagnosis not present

## 2020-10-17 DIAGNOSIS — Z87891 Personal history of nicotine dependence: Secondary | ICD-10-CM | POA: Diagnosis not present

## 2020-10-17 DIAGNOSIS — Z5111 Encounter for antineoplastic chemotherapy: Secondary | ICD-10-CM | POA: Diagnosis not present

## 2020-10-17 DIAGNOSIS — Z823 Family history of stroke: Secondary | ICD-10-CM | POA: Insufficient documentation

## 2020-10-17 DIAGNOSIS — Z818 Family history of other mental and behavioral disorders: Secondary | ICD-10-CM | POA: Insufficient documentation

## 2020-10-17 DIAGNOSIS — F319 Bipolar disorder, unspecified: Secondary | ICD-10-CM | POA: Insufficient documentation

## 2020-10-17 DIAGNOSIS — T451X5A Adverse effect of antineoplastic and immunosuppressive drugs, initial encounter: Secondary | ICD-10-CM | POA: Insufficient documentation

## 2020-10-17 DIAGNOSIS — E876 Hypokalemia: Secondary | ICD-10-CM | POA: Diagnosis not present

## 2020-10-17 DIAGNOSIS — Z811 Family history of alcohol abuse and dependence: Secondary | ICD-10-CM | POA: Insufficient documentation

## 2020-10-17 DIAGNOSIS — R5383 Other fatigue: Secondary | ICD-10-CM | POA: Diagnosis not present

## 2020-10-17 DIAGNOSIS — Z8249 Family history of ischemic heart disease and other diseases of the circulatory system: Secondary | ICD-10-CM | POA: Insufficient documentation

## 2020-10-17 DIAGNOSIS — Z803 Family history of malignant neoplasm of breast: Secondary | ICD-10-CM | POA: Insufficient documentation

## 2020-10-17 DIAGNOSIS — C259 Malignant neoplasm of pancreas, unspecified: Secondary | ICD-10-CM | POA: Diagnosis not present

## 2020-10-17 DIAGNOSIS — Z808 Family history of malignant neoplasm of other organs or systems: Secondary | ICD-10-CM | POA: Diagnosis not present

## 2020-10-17 DIAGNOSIS — Z8042 Family history of malignant neoplasm of prostate: Secondary | ICD-10-CM | POA: Insufficient documentation

## 2020-10-17 LAB — CBC WITH DIFFERENTIAL/PLATELET
Abs Immature Granulocytes: 0.01 10*3/uL (ref 0.00–0.07)
Basophils Absolute: 0 10*3/uL (ref 0.0–0.1)
Basophils Relative: 1 %
Eosinophils Absolute: 0.1 10*3/uL (ref 0.0–0.5)
Eosinophils Relative: 2 %
HCT: 33.3 % — ABNORMAL LOW (ref 36.0–46.0)
Hemoglobin: 11.1 g/dL — ABNORMAL LOW (ref 12.0–15.0)
Immature Granulocytes: 0 %
Lymphocytes Relative: 29 %
Lymphs Abs: 1.1 10*3/uL (ref 0.7–4.0)
MCH: 33.4 pg (ref 26.0–34.0)
MCHC: 33.3 g/dL (ref 30.0–36.0)
MCV: 100.3 fL — ABNORMAL HIGH (ref 80.0–100.0)
Monocytes Absolute: 0.5 10*3/uL (ref 0.1–1.0)
Monocytes Relative: 13 %
Neutro Abs: 2.1 10*3/uL (ref 1.7–7.7)
Neutrophils Relative %: 55 %
Platelets: 265 10*3/uL (ref 150–400)
RBC: 3.32 MIL/uL — ABNORMAL LOW (ref 3.87–5.11)
RDW: 14.3 % (ref 11.5–15.5)
WBC: 3.8 10*3/uL — ABNORMAL LOW (ref 4.0–10.5)
nRBC: 0 % (ref 0.0–0.2)

## 2020-10-17 LAB — FOLATE: Folate: 60.6 ng/mL (ref >5.9)

## 2020-10-17 LAB — COMPREHENSIVE METABOLIC PANEL
ALT: 18 U/L (ref 0–44)
AST: 24 U/L (ref 15–41)
Albumin: 3.9 g/dL (ref 3.5–5.0)
Alkaline Phosphatase: 66 U/L (ref 38–126)
Anion gap: 8 (ref 5–15)
BUN: 10 mg/dL (ref 6–20)
CO2: 27 mmol/L (ref 22–32)
Calcium: 9.1 mg/dL (ref 8.9–10.3)
Chloride: 103 mmol/L (ref 98–111)
Creatinine, Ser: 0.78 mg/dL (ref 0.44–1.00)
GFR, Estimated: >60 mL/min (ref >60)
Glucose, Bld: 152 mg/dL — ABNORMAL HIGH (ref 70–99)
Potassium: 3.6 mmol/L (ref 3.5–5.1)
Sodium: 138 mmol/L (ref 135–145)
Total Bilirubin: 0.6 mg/dL (ref 0.3–1.2)
Total Protein: 7.2 g/dL (ref 6.5–8.1)

## 2020-10-17 LAB — VITAMIN B12: Vitamin B-12: 425 pg/mL (ref 180–914)

## 2020-10-17 LAB — IRON AND TIBC
Iron: 84 ug/dL (ref 28–170)
Saturation Ratios: 23 % (ref 10.4–31.8)
TIBC: 374 ug/dL (ref 250–450)
UIBC: 290 ug/dL

## 2020-10-17 LAB — FERRITIN: Ferritin: 84 ng/mL (ref 11–307)

## 2020-10-17 MED ORDER — SODIUM CHLORIDE 0.9 % IV SOLN
600.0000 mg | Freq: Once | INTRAVENOUS | Status: AC
  Administered 2020-10-17: 600 mg via INTRAVENOUS
  Filled 2020-10-17: qty 17.5

## 2020-10-17 MED ORDER — SODIUM CHLORIDE 0.9 % IV SOLN
10.0000 mg | Freq: Once | INTRAVENOUS | Status: AC
  Administered 2020-10-17: 10 mg via INTRAVENOUS
  Filled 2020-10-17: qty 10

## 2020-10-17 MED ORDER — SODIUM CHLORIDE 0.9 % IV SOLN
2400.0000 mg/m2 | INTRAVENOUS | Status: DC
  Administered 2020-10-17: 3600 mg via INTRAVENOUS
  Filled 2020-10-17: qty 72

## 2020-10-17 MED ORDER — PALONOSETRON HCL INJECTION 0.25 MG/5ML
0.2500 mg | Freq: Once | INTRAVENOUS | Status: AC
  Administered 2020-10-17: 0.25 mg via INTRAVENOUS
  Filled 2020-10-17: qty 5

## 2020-10-17 MED ORDER — POTASSIUM CHLORIDE CRYS ER 20 MEQ PO TBCR: 20.0000 meq | EXTENDED_RELEASE_TABLET | Freq: Every day | ORAL | 0 refills | Status: DC

## 2020-10-17 MED ORDER — DEXTROSE 5 % IV SOLN
Freq: Once | INTRAVENOUS | Status: AC
  Filled 2020-10-17: qty 250

## 2020-10-17 MED ORDER — OXALIPLATIN CHEMO INJECTION 100 MG/20ML
65.0000 mg/m2 | Freq: Once | INTRAVENOUS | Status: AC
  Administered 2020-10-17: 100 mg via INTRAVENOUS
  Filled 2020-10-17: qty 20

## 2020-10-17 MED ORDER — SODIUM CHLORIDE 0.9 % IV SOLN
150.0000 mg/m2 | Freq: Once | INTRAVENOUS | Status: AC
  Administered 2020-10-17: 220 mg via INTRAVENOUS
  Filled 2020-10-17: qty 10

## 2020-10-17 MED ORDER — SODIUM CHLORIDE 0.9% FLUSH
10.0000 mL | Freq: Once | INTRAVENOUS | Status: AC
  Administered 2020-10-17: 10 mL via INTRAVENOUS
  Filled 2020-10-17: qty 10

## 2020-10-17 MED ORDER — SODIUM CHLORIDE 0.9 % IV SOLN
150.0000 mg | Freq: Once | INTRAVENOUS | Status: AC
  Administered 2020-10-17: 150 mg via INTRAVENOUS
  Filled 2020-10-17: qty 150

## 2020-10-17 MED ORDER — ATROPINE SULFATE 1 MG/ML IJ SOLN
0.5000 mg | Freq: Once | INTRAMUSCULAR | Status: AC | PRN
  Administered 2020-10-17: 0.5 mg via INTRAVENOUS
  Filled 2020-10-17: qty 1

## 2020-10-17 NOTE — Progress Notes (Signed)
Hematology/Oncology Consult note [name]  [phone] [phone]  Patient Care Team: Cletis Athens, MD as PCP - General (Internal Medicine) Grant Fontana, [place] as Referring Physician (Chiropractic Medicine) Christene Lye, MD (General Surgery) Sindy Guadeloupe, MD as Consulting Physician (Hematology and Oncology)   Name of the patient: Sheryl Perez  [id number]  1960/11/14   Date of visit: 10/17/20  Diagnosis-stage I pancreatic cancer  Chief complaint/ Reason for visit-on treatment assessment prior to cycle 3 of modified FOLFIRINOX chemotherapy  Heme/Onc history: patient is a 60 year old female with a history of irritable bowel disease for which she follows up with [facility].  She has undergone EGD and colonoscopy with them in the past.  More recently patient was complaining of left upper quadrant and epigastric abdominal pain which causes sudden episodes of abdominal spasms and was prescribed as needed tramadol for it.  These episodes of pain are unrelated to food and are dull aching or throbbing.  She has undergone cholecystectomy in the past.  She underwent CT abdomen and pelvis with contrast in the ER on 08/04/2020 Which did not reveal any acute pathology which showed a possible hypoechoic 1.9 cm right hepatic lobe lesion.  This was followed by an MRI which showed no discrete lesion in the liver and the area of concern noted on CT scan was more likely to represent variable fat deposition.  However there was loss of normal T1 signal in the peripheral pancreas and a potential small pancreatic lesion suspicious for early pancreatic adenocarcinoma in the neck of the pancreas.     Patient underwent EUS by Dr. Mont Dutton which showed an irregular mass in the pancreatic neck measuring 15 x 11 mm.  FNA showed adenocarcinoma.  Pancreatic duct measured 1.1 mm in the head with abrupt dilatation in the region of the mass to 4.1 mm.  No abnormality noted  in the common bile duct and hepatic duct.  Celiac region showed no significant endosonographic abnormality.  No lymphadenopathy was seen.  CA 19-9 mildly elevated at 35   Patient was seen by Dr. Hyman Hopes from pancreaticobiliary surgery at [facility] for surgical opinion.  Although patient has a small lesion involving the pancreatic neck, there was a concern for tumor extension towards the splenic vein and narrowing of portal splenic confluence.  Neoadjuvant chemotherapy for 3 months was therefore favored  Interval history-patient is tolerating chemotherapy well without any significant side effects.  Her appetite is fair and she is continuing to lose weight.  She is drinking Ensure about once a day.  She has a history of depression for which she is on Depakote and Seroquel which is being prescribed by psychiatry.  ECOG PS- 1 Pain scale- 0 Opioid associated constipation- no  Review of systems- Review of Systems  Constitutional:  Positive for malaise/fatigue and weight loss. Negative for chills and fever.  HENT:  Negative for congestion, ear discharge and nosebleeds.   Eyes:  Negative for blurred vision.  Respiratory:  Negative for cough, hemoptysis, sputum production, shortness of breath and wheezing.   Cardiovascular:  Negative for chest pain, palpitations, orthopnea and claudication.  Gastrointestinal:  Negative for abdominal pain, blood in stool, constipation, diarrhea, heartburn, melena, nausea and vomiting.  Genitourinary:  Negative for dysuria, flank pain, frequency, hematuria and urgency.  Musculoskeletal:  Negative for back pain, joint pain and myalgias.  Skin:  Negative for rash.  Neurological:  Negative for dizziness, tingling, focal weakness, seizures, weakness and headaches.  Endo/Heme/Allergies:  Does  not bruise/bleed easily.  Psychiatric/Behavioral:  Negative for depression and suicidal ideas. The patient does not have insomnia.      Allergies  Allergen Reactions   Sulfacetamide  Sodium Swelling    Diarrhea,dizziness, tongue swelling   Other Other (See Comments) and Swelling    PT states that anything with the sides effects "may cause rash" she cannot take due to her psoriasis, it will make the patches on her hand so thick she can't use her hands.  Diarrhea,dizziness, tongue swelling   Oxycontin [Oxycodone Hcl] Nausea And Vomiting   Sulfa Antibiotics Swelling    Diarrhea,dizziness, tongue swelling   Abilify [Aripiprazole] Other (See Comments)   Cephalosporins Rash   Erythromycin Other (See Comments)    Abd. pain Abd. pain Other reaction(s): Other (See Comments) Abd. 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    Bipolar disorder ([place])    Chest pain, unspecified    Chronic low back pain 10/23/2013   Chronic UTI    COPD (chronic obstructive pulmonary disease) (HCC)    Depression    Dysfunction of eustachian tube    Enlargement of lymph nodes    Family history of brain cancer    Family history of breast cancer    Family history of prostate cancer    History of Bell's palsy Right   Injury, other and unspecified, knee, leg, ankle, and foot    MRSA (methicillin resistant staph aureus) culture positive 07/11/2014   abscess   Other specified disease of hair and hair follicles    Rectal prolapse    Unspecified symptom associated with female genital organs      Past Surgical History:  Procedure Laterality Date   ABDOMINAL HYSTERECTOMY     bladder tack  2010   CHOLECYSTECTOMY  2002   FOOT SURGERY Bilateral 2004   bunion   GANGLION CYST EXCISION  2002   INCISION AND DRAINAGE ABSCESS  04-16-15   PARTIAL HYSTERECTOMY  2010   uterus removed   PORTA CATH INSERTION N/A 09/11/2020   Procedure: PORTA CATH INSERTION;  Surgeon: Algernon Huxley, MD;   Location: [facility];  Service: Cardiovascular;  Laterality: N/A;    Social History   Socioeconomic History   Marital status: Married    Spouse name: Not on file   Number of children: 3   Years of education: hs   Highest education level: Some college, no degree  Occupational History   Not on file  Tobacco Use   Smoking status: Former    Packs/day: 0.50    Years: 40.00    Pack years: 20.00    Types: Cigarettes    Quit date: 07/14/2020    Years since quitting: 0.2   Smokeless tobacco: Never  Vaping Use   Vaping Use: Never used  Substance and Sexual Activity   Alcohol use: No    Alcohol/week: 1.0 standard drink    Types: 1 Glasses of wine per week    Comment: occasional   Drug use: Yes    Types: Marijuana    Comment: last used 05-20-15   Sexual activity: Yes    Birth control/protection: None  Other Topics Concern   Not on file  Social History Narrative   Patient  lives at home with her husband [name]).   Patient works full time.   Education CNA    Right handed.   Caffeine Tea , Soda.   Social Determinants of Health   Financial Resource Strain: Not on file  Food Insecurity: Not on file  Transportation Needs: Not on file  Physical Activity: Not on file  Stress: Not on file  Social Connections: Not on file  Intimate Partner Violence: Not on file    Family History  Problem Relation Age of Onset   Diabetes Father    Cancer Father        brain tumor   Depression Father    Depression Sister    Alcohol abuse Brother    Depression Brother    Leukemia Maternal Aunt    Prostate cancer Maternal Uncle    Breast cancer Paternal Aunt        dx >50   Heart disease Maternal Grandmother    Heart attack Maternal Grandmother    Stroke Maternal Grandmother    Parkinson's disease Maternal Grandfather    Leukemia Paternal Grandmother    Diabetes Paternal Grandfather    Breast cancer Cousin    Brain cancer Niece        dx under 81     Current Outpatient  Medications:    albuterol (VENTOLIN HFA) 108 (90 Base) MCG/ACT inhaler, Inhale 2 puffs into the lungs every 4 (four) hours as needed for wheezing or shortness of breath., Disp: 8 g, Rfl: 4   ALPRAZolam (XANAX) 0.25 MG tablet, Take by mouth., Disp: , Rfl:    ALPRAZolam (XANAX) 0.5 MG tablet, Take 1 tablet (0.5 mg total) by mouth 2 (two) times daily as needed for anxiety., Disp: 20 tablet, Rfl: 1   Ascorbic Acid (VITAMIN C) 1000 MG tablet, Take 1,000 mg by mouth daily., Disp: , Rfl:    dexamethasone (DECADRON) 4 MG tablet, Take 2 tablets (8 mg total) by mouth daily. Start the day after chemotherapy for 3 days. Take with food., Disp: 8 tablet, Rfl: 5   divalproex (DEPAKOTE) 250 MG DR tablet, Take 1 tablet (250 mg total) by mouth 2 (two) times daily., Disp: 60 tablet, Rfl: 1   ferrous sulfate 325 (65 FE) MG tablet, Take 325 mg by mouth 2 (two) times daily with a meal., Disp: , Rfl:    fluticasone (FLONASE) 50 MCG/ACT nasal spray, Place 1 spray into both nostrils daily., Disp: 11.1 mL, Rfl: 2   lidocaine-prilocaine (EMLA) cream, Apply to affected area once, Disp: 30 g, Rfl: 3   loperamide (IMODIUM A-D) 2 MG tablet, Take 2 at onset of diarrhea, then 1 every 2hrs until 12hr without a BM. May take 2 tab every 4hrs at bedtime. If diarrhea recurs repeat., Disp: 100 tablet, Rfl: 1   loratadine (CLARITIN) 10 MG tablet, TAKE 1 TABLET BY MOUTH EVERY DAY, Disp: 90 tablet, Rfl: 1   Multiple Vitamin (MULTIVITAMIN) tablet, Take 1 tablet by mouth daily., Disp: , Rfl:    naproxen sodium (ALEVE) 220 MG tablet, Take by mouth., Disp: , Rfl:    propranolol (INDERAL) 20 MG tablet, Take 1 tablet (20 mg total) by mouth 3 (three) times daily., Disp: 270 tablet, Rfl: 1   QUEtiapine (SEROQUEL) 50 MG tablet, Take 1 tablet (50 mg total) by mouth at bedtime as needed., Disp: 90 tablet, Rfl: 0   triamcinolone cream (KENALOG) 0.1 %, Apply 1 application topically 2 (two) times daily., Disp: 30 g, Rfl: 0   cyclobenzaprine (FLEXERIL) 5  MG tablet, Take 5 mg by mouth 2 (two) times daily at 10 AM and 5 PM.  (Patient not taking: No sig reported), Disp: , Rfl:    famotidine (PEPCID) 20 MG tablet, Take by mouth. (Patient not taking: No sig reported), Disp: , Rfl:    ondansetron (ZOFRAN) 8 MG tablet, Take 1 tablet (8 mg total) by mouth 2 (two) times daily as needed. Start on day 3 after chemotherapy. (Patient not taking: No sig reported), Disp: 30 tablet, Rfl: 1   ondansetron (ZOFRAN-ODT) 4 MG disintegrating tablet, , Disp: , Rfl:    pantoprazole (PROTONIX) 40 MG tablet, , Disp: , Rfl:    potassium chloride SA (KLOR-CON) 20 MEQ tablet, Take 1 tablet (20 mEq total) by mouth daily., Disp: 21 tablet, Rfl: 0   prochlorperazine (COMPAZINE) 10 MG tablet, Take 1 tablet (10 mg total) by mouth every 6 (six) hours as needed (Nausea or vomiting). (Patient not taking: No sig reported), Disp: 30 tablet, Rfl: 1 No current facility-administered medications for this visit.  Facility-Administered Medications Ordered in Other Visits:    atropine injection 0.5 mg, 0.5 mg, Intravenous, Once PRN, Sindy Guadeloupe, MD   fluorouracil (ADRUCIL) 3,600 mg in sodium chloride 0.9 % 78 mL chemo infusion, 2,400 mg/m2 (Treatment Plan Recorded), Intravenous, 1 day or 1 dose, Sindy Guadeloupe, MD   irinotecan (CAMPTOSAR) 220 mg in sodium chloride 0.9 % 500 mL chemo infusion, 150 mg/m2 (Treatment Plan Recorded), Intravenous, Once, Sindy Guadeloupe, MD   leucovorin 600 mg in sodium chloride 0.9 % 250 mL infusion, 600 mg, Intravenous, Once, Sindy Guadeloupe, MD   oxaliplatin (ELOXATIN) 100 mg in dextrose 5 % 500 mL chemo infusion, 65 mg/m2 (Treatment Plan Recorded), Intravenous, Once, Sindy Guadeloupe, MD, Last Rate: 260 mL/hr at 10/17/20 1202, Infusion Verify at 10/17/20 1202  Physical exam:  Vitals:   10/17/20 0935  BP: (!) 108/53  Pulse: 64  Resp: 20  Temp: 97.8 F (36.6 C)  SpO2: 100%  Weight: 111 lb 9.6 oz (50.6 kg)   Physical Exam Constitutional:      General: She  is not in acute distress.    Comments: Appears fatigued  Cardiovascular:     Rate and Rhythm: Normal rate and regular rhythm.     Heart sounds: Normal heart sounds.  Pulmonary:     Effort: Pulmonary effort is normal.     Breath sounds: Normal breath sounds.  Abdominal:     General: Bowel sounds are normal.     Palpations: Abdomen is soft.  Skin:    General: Skin is warm and dry.  Neurological:     Mental Status: She is alert and oriented to person, place, and time.     CMP Latest Ref Rng & Units 10/17/2020  Glucose 70 - 99 mg/dL 152(H)  BUN 6 - 20 mg/dL 10  Creatinine 0.44 - 1.00 mg/dL 0.78  Sodium 135 - 145 mmol/L 138  Potassium 3.5 - 5.1 mmol/L 3.6  Chloride 98 - 111 mmol/L 103  CO2 22 - 32 mmol/L 27  Calcium 8.9 - 10.3 mg/dL 9.1  Total Protein 6.5 - 8.1 g/dL 7.2  Total Bilirubin 0.3 - 1.2 mg/dL 0.6  Alkaline Phos 38 - 126 U/L 66  AST 15 - 41 U/L 24  ALT 0 - 44 U/L 18   CBC Latest Ref Rng & Units 10/17/2020  WBC 4.0 - 10.5 K/uL 3.8(L)  Hemoglobin 12.0 - 15.0 g/dL 11.1(L)  Hematocrit 36.0 - 46.0 % 33.3(L)  Platelets 150 - 400 K/uL 265     Assessment and plan- Patient is a 60 y.o. female with stage I pancreatic neck adenocarcinoma cT1 cN0 M0 borderline resectable.  She is here for on treatment assessment prior to cycle 3 of neoadjuvant modified FOLFIRINOX chemotherapy  Counts okay to proceed with cycle 3 of neoadjuvant modified FOLFIRINOX chemotherapy today.  White count is mildly low at 3.8 with an ANC of 2.2.  I will plan to give her a Udenyca when she comes on Monday for her pump disconnect.  She is tolerating chemotherapy well without any significant side effects.  However she does have ongoing history of depression for which she is on Seroquel and Depakote.  She reports that her present illness is causing some exacerbation of her depression symptoms although she denies any suicidal or homicidal ideations.  I have asked her to reach out to her psychiatrist and see if any of  her medications need to be adjusted.  Also stressed the importance of adequate nutrition during chemotherapy and in preparation for upcoming surgery.  I have asked her to increase her Ensure to twice a day.  She also follows up with nutrition  I will see her back in 2 weeks for cycle 4 of modified neoadjuvant FOLFIRINOX chemotherapy.  Mild anemia: Continue to monitor  Hypokalemia: Resolved.  However I would like her to continue taking oral potassium 20 mEq daily   Visit Diagnosis 1. Encounter for antineoplastic chemotherapy   2. Pancreatic adenocarcinoma [facility])      Dr. Randa Evens, MD, MPH [facility] at [facility] [id number] 10/17/2020 1:03 PM

## 2020-10-17 NOTE — Patient Instructions (Signed)
[facility]  Discharge Instructions: Thank you for choosing [facility] to provide your oncology and hematology care.  If you have a lab appointment with the [facility], please go directly to the [facility] and check in at the registration area.  Wear comfortable clothing and clothing appropriate for easy access to any Portacath or PICC line.   We strive to give you quality time with your provider. You may need to reschedule your appointment if you arrive late (15 or more minutes).  Arriving late affects you and other patients whose appointments are after yours.  Also, if you miss three or more appointments without notifying the office, you may be dismissed from the clinic at the provider's discretion.      For prescription refill requests, have your pharmacy contact our office and allow 72 hours for refills to be completed.    Today you received the following chemotherapy and/or immunotherapy agents Oxaliplatin, leucovorin, 9f, irinotecan       To help prevent nausea and vomiting after your treatment, we encourage you to take your nausea medication as directed.  BELOW ARE SYMPTOMS THAT SHOULD BE REPORTED IMMEDIATELY: *FEVER GREATER THAN 100.4 F (38 C) OR HIGHER *CHILLS OR SWEATING *NAUSEA AND VOMITING THAT IS NOT CONTROLLED WITH YOUR NAUSEA MEDICATION *UNUSUAL SHORTNESS OF BREATH *UNUSUAL BRUISING OR BLEEDING *URINARY PROBLEMS (pain or burning when urinating, or frequent urination) *BOWEL PROBLEMS (unusual diarrhea, constipation, pain near the anus) TENDERNESS IN MOUTH AND THROAT WITH OR WITHOUT PRESENCE OF ULCERS (sore throat, sores in mouth, or a toothache) UNUSUAL RASH, SWELLING OR PAIN  UNUSUAL VAGINAL DISCHARGE OR ITCHING   Items with * indicate a potential emergency and should be followed up as soon as possible or go to the Emergency Department if any problems should occur.  Please show the CHEMOTHERAPY ALERT CARD or  IMMUNOTHERAPY ALERT CARD at check-in to the Emergency Department and triage nurse.  Should you have questions after your visit or need to cancel or reschedule your appointment, please contact [facility] [phone] follow the prompts.  Office hours are 8:00 a.m. to 4:30 p.m. Monday - Friday. Please note that voicemails left after 4:00 p.m. may not be returned until the following business day.  We are closed weekends and major holidays. You have access to a nurse at all times for urgent questions. Please call the main number to the clinic [phone] follow the prompts.  For any non-urgent questions, you may also contact your provider using MyChart. We now offer e-Visits for anyone 124and older to request care online for non-urgent symptoms. For details visit [email or web address]   Also download the MyChart app! Go to the app store, search "MyChart", open the app, select [facility], and log in with your MyChart username and password.  Due to Covid, a mask is required upon entering the hospital/clinic. If you do not have a mask, one will be given to you upon arrival. For doctor visits, patients may have 1 support person aged 129or older with them. For treatment visits, patients cannot have anyone with them due to current Covid guidelines and our immunocompromised population.  Oxaliplatin Injection What is this medication? OXALIPLATIN (ox AL i PLA tin) is a chemotherapy drug. It targets fast dividing cells, like cancer cells, and causes these cells to die. This medicine is used to treat cancers of the colon and rectum, and many other cancers. This medicine may be used for  other purposes; ask your health care provider or pharmacist if you have questions. COMMON BRAND NAME(S): Eloxatin What should I tell my care team before I take this medication? They need to know if you have any of these conditions: heart disease history of irregular heartbeat liver  disease low blood counts, like white cells, platelets, or red blood cells lung or breathing disease, like asthma take medicines that treat or prevent blood clots tingling of the fingers or toes, or other nerve disorder an unusual or allergic reaction to oxaliplatin, other chemotherapy, other medicines, foods, dyes, or preservatives pregnant or trying to get pregnant breast-feeding How should I use this medication? This drug is given as an infusion into a vein. It is administered in a hospital or clinic by a specially trained health care professional. Talk to your pediatrician regarding the use of this medicine in children. Special care may be needed. Overdosage: If you think you have taken too much of this medicine contact a poison control center or emergency room at once. NOTE: This medicine is only for you. Do not share this medicine with others. What if I miss a dose? It is important not to miss a dose. Call your doctor or health care professional if you are unable to keep an appointment. What may interact with this medication? Do not take this medicine with any of the following medications: cisapride dronedarone pimozide thioridazine This medicine may also interact with the following medications: aspirin and aspirin-like medicines certain medicines that treat or prevent blood clots like warfarin, apixaban, dabigatran, and rivaroxaban cisplatin cyclosporine diuretics medicines for infection like acyclovir, adefovir, amphotericin B, bacitracin, cidofovir, foscarnet, ganciclovir, gentamicin, pentamidine, vancomycin NSAIDs, medicines for pain and inflammation, like ibuprofen or naproxen other medicines that prolong the QT interval (an abnormal heart rhythm) pamidronate zoledronic acid This list may not describe all possible interactions. Give your health care provider a list of all the medicines, herbs, non-prescription drugs, or dietary supplements you use. Also tell them if you  smoke, drink alcohol, or use illegal drugs. Some items may interact with your medicine. What should I watch for while using this medication? Your condition will be monitored carefully while you are receiving this medicine. You may need blood work done while you are taking this medicine. This medicine may make you feel generally unwell. This is not uncommon as chemotherapy can affect healthy cells as well as cancer cells. Report any side effects. Continue your course of treatment even though you feel ill unless your healthcare professional tells you to stop. This medicine can make you more sensitive to cold. Do not drink cold drinks or use ice. Cover exposed skin before coming in contact with cold temperatures or cold objects. When out in cold weather wear warm clothing and cover your mouth and nose to warm the air that goes into your lungs. Tell your doctor if you get sensitive to the cold. Do not become pregnant while taking this medicine or for 9 months after stopping it. Women should inform their health care professional if they wish to become pregnant or think they might be pregnant. Men should not father a child while taking this medicine and for 6 months after stopping it. There is potential for serious side effects to an unborn child. Talk to your health care professional for more information. Do not breast-feed a child while taking this medicine or for 3 months after stopping it. This medicine has caused ovarian failure in some women. This medicine may make it more difficult  to get pregnant. Talk to your health care professional if you are concerned about your fertility. This medicine has caused decreased sperm counts in some men. This may make it more difficult to father a child. Talk to your health care professional if you are concerned about your fertility. This medicine may increase your risk of getting an infection. Call your health care professional for advice if you get a fever, chills, or  sore throat, or other symptoms of a cold or flu. Do not treat yourself. Try to avoid being around people who are sick. Avoid taking medicines that contain aspirin, acetaminophen, ibuprofen, naproxen, or ketoprofen unless instructed by your health care professional. These medicines may hide a fever. Be careful brushing or flossing your teeth or using a toothpick because you may get an infection or bleed more easily. If you have any dental work done, tell your dentist you are receiving this medicine. What side effects may I notice from receiving this medication? Side effects that you should report to your doctor or health care professional as soon as possible: allergic reactions like skin rash, itching or hives, swelling of the face, lips, or tongue breathing problems cough low blood counts - this medicine may decrease the number of white blood cells, red blood cells, and platelets. You may be at increased risk for infections and bleeding nausea, vomiting pain, redness, or irritation at site where injected pain, tingling, numbness in the hands or feet signs and symptoms of bleeding such as bloody or black, tarry stools; red or dark brown urine; spitting up blood or brown material that looks like coffee grounds; red spots on the skin; unusual bruising or bleeding from the eyes, gums, or nose signs and symptoms of a dangerous change in heartbeat or heart rhythm like chest pain; dizziness; fast, irregular heartbeat; palpitations; feeling faint or lightheaded; falls signs and symptoms of infection like fever; chills; cough; sore throat; pain or trouble passing urine signs and symptoms of liver injury like dark yellow or brown urine; general ill feeling or flu-like symptoms; light-colored stools; loss of appetite; nausea; right upper belly pain; unusually weak or tired; yellowing of the eyes or skin signs and symptoms of low red blood cells or anemia such as unusually weak or tired; feeling faint or  lightheaded; falls signs and symptoms of muscle injury like dark urine; trouble passing urine or change in the amount of urine; unusually weak or tired; muscle pain; back pain Side effects that usually do not require medical attention (report to your doctor or health care professional if they continue or are bothersome): changes in taste diarrhea gas hair loss loss of appetite mouth sores This list may not describe all possible side effects. Call your doctor for medical advice about side effects. You may report side effects to FDA at 1-800-FDA-1088. Where should I keep my medication? This drug is given in a hospital or clinic and will not be stored at home. NOTE: This sheet is a summary. It may not cover all possible information. If you have questions about this medicine, talk to your doctor, pharmacist, or health care provider.  2022 Elsevier/Gold Standard (2018-06-14 12:20:35)  Leucovorin injection What is this medication? LEUCOVORIN (loo koe VOR in) is used to prevent or treat the harmful effects of some medicines. This medicine is used to treat anemia caused by a low amount of folic acid in the body. It is also used with 5-fluorouracil (5-FU) to treat colon cancer. This medicine may be used for other purposes;  ask your health care provider or pharmacist if you have questions. What should I tell my care team before I take this medication? They need to know if you have any of these conditions: anemia from low levels of vitamin B-12 in the blood an unusual or allergic reaction to leucovorin, folic acid, other medicines, foods, dyes, or preservatives pregnant or trying to get pregnant breast-feeding How should I use this medication? This medicine is for injection into a muscle or into a vein. It is given by a health care professional in a hospital or clinic setting. Talk to your pediatrician regarding the use of this medicine in children. Special care may be needed. Overdosage: If you  think you have taken too much of this medicine contact a poison control center or emergency room at once. NOTE: This medicine is only for you. Do not share this medicine with others. What if I miss a dose? This does not apply. What may interact with this medication? capecitabine fluorouracil phenobarbital phenytoin primidone trimethoprim-sulfamethoxazole This list may not describe all possible interactions. Give your health care provider a list of all the medicines, herbs, non-prescription drugs, or dietary supplements you use. Also tell them if you smoke, drink alcohol, or use illegal drugs. Some items may interact with your medicine. What should I watch for while using this medication? Your condition will be monitored carefully while you are receiving this medicine. This medicine may increase the side effects of 5-fluorouracil, 5-FU. Tell your doctor or health care professional if you have diarrhea or mouth sores that do not get better or that get worse. What side effects may I notice from receiving this medication? Side effects that you should report to your doctor or health care professional as soon as possible: allergic reactions like skin rash, itching or hives, swelling of the face, lips, or tongue breathing problems fever, infection mouth sores unusual bleeding or bruising unusually weak or tired Side effects that usually do not require medical attention (report to your doctor or health care professional if they continue or are bothersome): constipation or diarrhea loss of appetite nausea, vomiting This list may not describe all possible side effects. Call your doctor for medical advice about side effects. You may report side effects to FDA at 1-800-FDA-1088. Where should I keep my medication? This drug is given in a hospital or clinic and will not be stored at home. NOTE: This sheet is a summary. It may not cover all possible information. If you have questions about this  medicine, talk to your doctor, pharmacist, or health care provider.  2022 Elsevier/Gold Standard (2007-08-01 16:50:29)   Irinotecan injection What is this medication? IRINOTECAN (ir in oh TEE kan ) is a chemotherapy drug. It is used to treat colon and rectal cancer. This medicine may be used for other purposes; ask your health care provider or pharmacist if you have questions. COMMON BRAND NAME(S): Camptosar What should I tell my care team before I take this medication? They need to know if you have any of these conditions: dehydration diarrhea infection (especially a virus infection such as chickenpox, cold sores, or herpes) liver disease low blood counts, like low white cell, platelet, or red cell counts low levels of calcium, magnesium, or potassium in the blood recent or ongoing radiation therapy an unusual or allergic reaction to irinotecan, other medicines, foods, dyes, or preservatives pregnant or trying to get pregnant breast-feeding How should I use this medication? This drug is given as an infusion into a vein. It  is administered in a hospital or clinic by a specially trained health care professional. Talk to your pediatrician regarding the use of this medicine in children. Special care may be needed. Overdosage: If you think you have taken too much of this medicine contact a poison control center or emergency room at once. NOTE: This medicine is only for you. Do not share this medicine with others. What if I miss a dose? It is important not to miss your dose. Call your doctor or health care professional if you are unable to keep an appointment. What may interact with this medication? Do not take this medicine with any of the following medications: cobicistat itraconazole This medicine may interact with the following medications: antiviral medicines for HIV or AIDS certain antibiotics like rifampin or rifabutin certain medicines for fungal infections like ketoconazole,  posaconazole, and voriconazole certain medicines for seizures like carbamazepine, phenobarbital, phenotoin clarithromycin gemfibrozil nefazodone [facility] John's Wort This list may not describe all possible interactions. Give your health care provider a list of all the medicines, herbs, non-prescription drugs, or dietary supplements you use. Also tell them if you smoke, drink alcohol, or use illegal drugs. Some items may interact with your medicine. What should I watch for while using this medication? Your condition will be monitored carefully while you are receiving this medicine. You will need important blood work done while you are taking this medicine. This drug may make you feel generally unwell. This is not uncommon, as chemotherapy can affect healthy cells as well as cancer cells. Report any side effects. Continue your course of treatment even though you feel ill unless your doctor tells you to stop. In some cases, you may be given additional medicines to help with side effects. Follow all directions for their use. You may get drowsy or dizzy. Do not drive, use machinery, or do anything that needs mental alertness until you know how this medicine affects you. Do not stand or sit up quickly, especially if you are an older patient. This reduces the risk of dizzy or fainting spells. Call your health care professional for advice if you get a fever, chills, or sore throat, or other symptoms of a cold or flu. Do not treat yourself. This medicine decreases your body's ability to fight infections. Try to avoid being around people who are sick. Avoid taking products that contain aspirin, acetaminophen, ibuprofen, naproxen, or ketoprofen unless instructed by your doctor. These medicines may hide a fever. This medicine may increase your risk to bruise or bleed. Call your doctor or health care professional if you notice any unusual bleeding. Be careful brushing and flossing your teeth or using a toothpick  because you may get an infection or bleed more easily. If you have any dental work done, tell your dentist you are receiving this medicine. Do not become pregnant while taking this medicine or for 6 months after stopping it. Women should inform their health care professional if they wish to become pregnant or think they might be pregnant. Men should not father a child while taking this medicine and for 3 months after stopping it. There is potential for serious side effects to an unborn child. Talk to your health care professional for more information. Do not breast-feed an infant while taking this medicine or for 7 days after stopping it. This medicine has caused ovarian failure in some women. This medicine may make it more difficult to get pregnant. Talk to your health care professional if you are concerned about your fertility.  This medicine has caused decreased sperm counts in some men. This may make it more difficult to father a child. Talk to your health care professional if you are concerned about your fertility. What side effects may I notice from receiving this medication? Side effects that you should report to your doctor or health care professional as soon as possible: allergic reactions like skin rash, itching or hives, swelling of the face, lips, or tongue chest pain diarrhea flushing, runny nose, sweating during infusion low blood counts - this medicine may decrease the number of white blood cells, red blood cells and platelets. You may be at increased risk for infections and bleeding. nausea, vomiting pain, swelling, warmth in the leg signs of decreased platelets or bleeding - bruising, pinpoint red spots on the skin, black, tarry stools, blood in the urine signs of infection - fever or chills, cough, sore throat, pain or difficulty passing urine signs of decreased red blood cells - unusually weak or tired, fainting spells, lightheadedness Side effects that usually do not require  medical attention (report to your doctor or health care professional if they continue or are bothersome): constipation hair loss headache loss of appetite mouth sores stomach pain This list may not describe all possible side effects. Call your doctor for medical advice about side effects. You may report side effects to FDA at 1-800-FDA-1088. Where should I keep my medication? This drug is given in a hospital or clinic and will not be stored at home. NOTE: This sheet is a summary. It may not cover all possible information. If you have questions about this medicine, talk to your doctor, pharmacist, or health care provider.  2022 Elsevier/Gold Standard (2018-12-26 17:46:13)   Fluorouracil, 5-FU injection What is this medication? FLUOROURACIL, 5-FU (flure oh YOOR a sil) is a chemotherapy drug. It slows the growth of cancer cells. This medicine is used to treat many types of cancer like breast cancer, colon or rectal cancer, pancreatic cancer, and stomach cancer. This medicine may be used for other purposes; ask your health care provider or pharmacist if you have questions. COMMON BRAND NAME(S): Adrucil What should I tell my care team before I take this medication? They need to know if you have any of these conditions: blood disorders dihydropyrimidine dehydrogenase (DPD) deficiency infection (especially a virus infection such as chickenpox, cold sores, or herpes) kidney disease liver disease malnourished, poor nutrition recent or ongoing radiation therapy an unusual or allergic reaction to fluorouracil, other chemotherapy, other medicines, foods, dyes, or preservatives pregnant or trying to get pregnant breast-feeding How should I use this medication? This drug is given as an infusion or injection into a vein. It is administered in a hospital or clinic by a specially trained health care professional. Talk to your pediatrician regarding the use of this medicine in children. Special care  may be needed. Overdosage: If you think you have taken too much of this medicine contact a poison control center or emergency room at once. NOTE: This medicine is only for you. Do not share this medicine with others. What if I miss a dose? It is important not to miss your dose. Call your doctor or health care professional if you are unable to keep an appointment. What may interact with this medication? Do not take this medicine with any of the following medications: live virus vaccines This medicine may also interact with the following medications: medicines that treat or prevent blood clots like warfarin, enoxaparin, and dalteparin This list may not describe all  possible interactions. Give your health care provider a list of all the medicines, herbs, non-prescription drugs, or dietary supplements you use. Also tell them if you smoke, drink alcohol, or use illegal drugs. Some items may interact with your medicine. What should I watch for while using this medication? Visit your doctor for checks on your progress. This drug may make you feel generally unwell. This is not uncommon, as chemotherapy can affect healthy cells as well as cancer cells. Report any side effects. Continue your course of treatment even though you feel ill unless your doctor tells you to stop. In some cases, you may be given additional medicines to help with side effects. Follow all directions for their use. Call your doctor or health care professional for advice if you get a fever, chills or sore throat, or other symptoms of a cold or flu. Do not treat yourself. This drug decreases your body's ability to fight infections. Try to avoid being around people who are sick. This medicine may increase your risk to bruise or bleed. Call your doctor or health care professional if you notice any unusual bleeding. Be careful brushing and flossing your teeth or using a toothpick because you may get an infection or bleed more easily. If you  have any dental work done, tell your dentist you are receiving this medicine. Avoid taking products that contain aspirin, acetaminophen, ibuprofen, naproxen, or ketoprofen unless instructed by your doctor. These medicines may hide a fever. Do not become pregnant while taking this medicine. Women should inform their doctor if they wish to become pregnant or think they might be pregnant. There is a potential for serious side effects to an unborn child. Talk to your health care professional or pharmacist for more information. Do not breast-feed an infant while taking this medicine. Men should inform their doctor if they wish to father a child. This medicine may lower sperm counts. Do not treat diarrhea with over the counter products. Contact your doctor if you have diarrhea that lasts more than 2 days or if it is severe and watery. This medicine can make you more sensitive to the sun. Keep out of the sun. If you cannot avoid being in the sun, wear protective clothing and use sunscreen. Do not use sun lamps or tanning beds/booths. What side effects may I notice from receiving this medication? Side effects that you should report to your doctor or health care professional as soon as possible: allergic reactions like skin rash, itching or hives, swelling of the face, lips, or tongue low blood counts - this medicine may decrease the number of white blood cells, red blood cells and platelets. You may be at increased risk for infections and bleeding. signs of infection - fever or chills, cough, sore throat, pain or difficulty passing urine signs of decreased platelets or bleeding - bruising, pinpoint red spots on the skin, black, tarry stools, blood in the urine signs of decreased red blood cells - unusually weak or tired, fainting spells, lightheadedness breathing problems changes in vision chest pain mouth sores nausea and vomiting pain, swelling, redness at site where injected pain, tingling, numbness in  the hands or feet redness, swelling, or sores on hands or feet stomach pain unusual bleeding Side effects that usually do not require medical attention (report to your doctor or health care professional if they continue or are bothersome): changes in finger or toe nails diarrhea dry or itchy skin hair loss headache loss of appetite sensitivity of eyes to the light stomach  upset unusually teary eyes This list may not describe all possible side effects. Call your doctor for medical advice about side effects. You may report side effects to FDA at 1-800-FDA-1088. Where should I keep my medication? This drug is given in a hospital or clinic and will not be stored at home. NOTE: This sheet is a summary. It may not cover all possible information. If you have questions about this medicine, talk to your doctor, pharmacist, or health care provider.  2022 Elsevier/Gold Standard (2018-12-26 15:00:03)

## 2020-10-17 NOTE — Progress Notes (Signed)
Nutrition Follow-up:  Patient with stage I pancreatic cancer.  Patient receiving folfirinox, neoadjuvant  Met with patient during infusion.  Patient reports that appetite is about the same.  Still eating a crack and egg bowl for breakfast each morning.  Ramen noodles for lunch and evening meal.  Drinking boost original 1 time a day.  Planning getting boost plus when goes to the store.  Has trouble chewing certain foods with dentures.    Reports has always had loose stool due to IBS.  Having bowel movement about every 2 days at this time.      Medications: reviewed  Labs: reviewed  Anthropometrics:   Weight 111 lb 9.6 oz today  113 lb 4.8 oz on 8/19  UBW of 120-125 lb   NUTRITION DIAGNOSIS: Unintentional weight loss continues   INTERVENTION:  Patient to switch to 360 calorie boost plus shake 2-3 times per day Discussed higher calorie items that patient can take for lunch (creamy soup, frozen microwaveable meal)    MONITORING, EVALUATION, GOAL: Weight trends, intake   NEXT VISIT: Friday, Sept 23rd during infusion  Nelsy Madonna B. Zenia Resides, [place], [place] Registered Dietitian [phone] (mobile)

## 2020-10-20 ENCOUNTER — Inpatient Hospital Stay: Payer: BC Managed Care – PPO

## 2020-10-20 ENCOUNTER — Other Ambulatory Visit: Payer: Self-pay

## 2020-10-20 VITALS — BP 123/53 | HR 91 | Temp 97.7°F | Resp 18

## 2020-10-20 DIAGNOSIS — Z5189 Encounter for other specified aftercare: Secondary | ICD-10-CM | POA: Diagnosis not present

## 2020-10-20 DIAGNOSIS — Z79899 Other long term (current) drug therapy: Secondary | ICD-10-CM | POA: Diagnosis not present

## 2020-10-20 DIAGNOSIS — C259 Malignant neoplasm of pancreas, unspecified: Secondary | ICD-10-CM

## 2020-10-20 DIAGNOSIS — Z808 Family history of malignant neoplasm of other organs or systems: Secondary | ICD-10-CM | POA: Diagnosis not present

## 2020-10-20 DIAGNOSIS — T451X5A Adverse effect of antineoplastic and immunosuppressive drugs, initial encounter: Secondary | ICD-10-CM | POA: Diagnosis not present

## 2020-10-20 DIAGNOSIS — R5383 Other fatigue: Secondary | ICD-10-CM | POA: Diagnosis not present

## 2020-10-20 DIAGNOSIS — C257 Malignant neoplasm of other parts of pancreas: Secondary | ICD-10-CM | POA: Diagnosis not present

## 2020-10-20 DIAGNOSIS — Z806 Family history of leukemia: Secondary | ICD-10-CM | POA: Diagnosis not present

## 2020-10-20 DIAGNOSIS — D6481 Anemia due to antineoplastic chemotherapy: Secondary | ICD-10-CM | POA: Diagnosis not present

## 2020-10-20 DIAGNOSIS — Z803 Family history of malignant neoplasm of breast: Secondary | ICD-10-CM | POA: Diagnosis not present

## 2020-10-20 DIAGNOSIS — F319 Bipolar disorder, unspecified: Secondary | ICD-10-CM | POA: Diagnosis not present

## 2020-10-20 DIAGNOSIS — Z833 Family history of diabetes mellitus: Secondary | ICD-10-CM | POA: Diagnosis not present

## 2020-10-20 DIAGNOSIS — Z5111 Encounter for antineoplastic chemotherapy: Secondary | ICD-10-CM | POA: Diagnosis not present

## 2020-10-20 DIAGNOSIS — Z87891 Personal history of nicotine dependence: Secondary | ICD-10-CM | POA: Diagnosis not present

## 2020-10-20 DIAGNOSIS — Z818 Family history of other mental and behavioral disorders: Secondary | ICD-10-CM | POA: Diagnosis not present

## 2020-10-20 DIAGNOSIS — Z8042 Family history of malignant neoplasm of prostate: Secondary | ICD-10-CM | POA: Diagnosis not present

## 2020-10-20 DIAGNOSIS — Z811 Family history of alcohol abuse and dependence: Secondary | ICD-10-CM | POA: Diagnosis not present

## 2020-10-20 LAB — CA 19-9 (SERIAL): CA 19-9: 46 U/mL — ABNORMAL HIGH (ref 0–35)

## 2020-10-20 MED ORDER — HEPARIN SOD (PORK) LOCK FLUSH 100 UNIT/ML IV SOLN
500.0000 [IU] | Freq: Once | INTRAVENOUS | Status: AC | PRN
  Filled 2020-10-20: qty 5

## 2020-10-20 MED ORDER — PEGFILGRASTIM-CBQV 6 MG/0.6ML ~~LOC~~ SOSY
6.0000 mg | PREFILLED_SYRINGE | Freq: Once | SUBCUTANEOUS | Status: AC
  Administered 2020-10-20: 6 mg via SUBCUTANEOUS
  Filled 2020-10-20: qty 0.6

## 2020-10-20 MED ORDER — HEPARIN SOD (PORK) LOCK FLUSH 100 UNIT/ML IV SOLN
INTRAVENOUS | Status: AC
  Administered 2020-10-20: 500 [IU]
  Filled 2020-10-20: qty 5

## 2020-10-20 MED ORDER — SODIUM CHLORIDE 0.9% FLUSH
10.0000 mL | INTRAVENOUS | Status: DC | PRN
  Administered 2020-10-20: 10 mL
  Filled 2020-10-20: qty 10

## 2020-10-20 NOTE — Patient Instructions (Signed)
[facility]   Discharge Instructions: Thank you for choosing [facility] to provide your oncology and hematology care.  If you have a lab appointment with the [facility], please go directly to the [facility] and check in at the registration area.  Wear comfortable clothing and clothing appropriate for easy access to any Portacath or PICC line.   We strive to give you quality time with your provider. You may need to reschedule your appointment if you arrive late (15 or more minutes).  Arriving late affects you and other patients whose appointments are after yours.  Also, if you miss three or more appointments without notifying the office, you may be dismissed from the clinic at the provider's discretion.      For prescription refill requests, have your pharmacy contact our office and allow 72 hours for refills to be completed.    Today you received the following: Udenyca injection.      To help prevent nausea and vomiting after your treatment, we encourage you to take your nausea medication as directed.  BELOW ARE SYMPTOMS THAT SHOULD BE REPORTED IMMEDIATELY: *FEVER GREATER THAN 100.4 F (38 C) OR HIGHER *CHILLS OR SWEATING *NAUSEA AND VOMITING THAT IS NOT CONTROLLED WITH YOUR NAUSEA MEDICATION *UNUSUAL SHORTNESS OF BREATH *UNUSUAL BRUISING OR BLEEDING *URINARY PROBLEMS (pain or burning when urinating, or frequent urination) *BOWEL PROBLEMS (unusual diarrhea, constipation, pain near the anus) TENDERNESS IN MOUTH AND THROAT WITH OR WITHOUT PRESENCE OF ULCERS (sore throat, sores in mouth, or a toothache) UNUSUAL RASH, SWELLING OR PAIN  UNUSUAL VAGINAL DISCHARGE OR ITCHING   Items with * indicate a potential emergency and should be followed up as soon as possible or go to the Emergency Department if any problems should occur.  Please show the CHEMOTHERAPY ALERT CARD or IMMUNOTHERAPY ALERT CARD at check-in to the Emergency Department  and triage nurse.  Should you have questions after your visit or need to cancel or reschedule your appointment, please contact [facility]  [phone] and follow the prompts.  Office hours are 8:00 a.m. to 4:30 p.m. Monday - Friday. Please note that voicemails left after 4:00 p.m. may not be returned until the following business day.  We are closed weekends and major holidays. You have access to a nurse at all times for urgent questions. Please call the main number to the clinic [phone] and follow the prompts.  For any non-urgent questions, you may also contact your provider using MyChart. We now offer e-Visits for anyone 71 and older to request care online for non-urgent symptoms. For details visit [email or web address].   Also download the MyChart app! Go to the app store, search "MyChart", open the app, select [facility], and log in with your MyChart username and password.  Due to Covid, a mask is required upon entering the hospital/clinic. If you do not have a mask, one will be given to you upon arrival. For doctor visits, patients may have 1 support person aged 74 or older with them. For treatment visits, patients cannot have anyone with them due to current Covid guidelines and our immunocompromised population.

## 2020-10-22 ENCOUNTER — Encounter: Payer: Self-pay | Admitting: Psychiatry

## 2020-10-22 ENCOUNTER — Other Ambulatory Visit: Payer: Self-pay

## 2020-10-22 ENCOUNTER — Telehealth (INDEPENDENT_AMBULATORY_CARE_PROVIDER_SITE_OTHER): Payer: BC Managed Care – PPO | Admitting: Psychiatry

## 2020-10-22 DIAGNOSIS — F1221 Cannabis dependence, in remission: Secondary | ICD-10-CM

## 2020-10-22 DIAGNOSIS — F3178 Bipolar disorder, in full remission, most recent episode mixed: Secondary | ICD-10-CM

## 2020-10-22 DIAGNOSIS — F603 Borderline personality disorder: Secondary | ICD-10-CM

## 2020-10-22 DIAGNOSIS — F17200 Nicotine dependence, unspecified, uncomplicated: Secondary | ICD-10-CM | POA: Diagnosis not present

## 2020-10-22 DIAGNOSIS — Z79899 Other long term (current) drug therapy: Secondary | ICD-10-CM

## 2020-10-22 DIAGNOSIS — F4323 Adjustment disorder with mixed anxiety and depressed mood: Secondary | ICD-10-CM | POA: Diagnosis not present

## 2020-10-22 DIAGNOSIS — F411 Generalized anxiety disorder: Secondary | ICD-10-CM

## 2020-10-22 MED ORDER — DIVALPROEX SODIUM 250 MG PO DR TAB: 250.0000 mg | DELAYED_RELEASE_TABLET | Freq: Two times a day (BID) | ORAL | 1 refills | Status: DC

## 2020-10-22 MED ORDER — PROPRANOLOL HCL 20 MG PO TABS: 20.0000 mg | ORAL_TABLET | Freq: Three times a day (TID) | ORAL | 1 refills | Status: DC

## 2020-10-22 NOTE — Progress Notes (Signed)
Virtual Visit via Telephone Note  I connected with Sheryl Perez on 10/22/20 at  4:00 PM EDT by telephone and verified that I am speaking with the correct person using two identifiers.  Location Provider Location : [facility] Patient Location : Home  Participants: Patient ,Spouse, Provider    I discussed the limitations, risks, security and privacy concerns of performing an evaluation and management service by telephone and the availability of in person appointments. I also discussed with the patient that there may be a patient responsible charge related to this service. The patient expressed understanding and agreed to proceed.    I discussed the assessment and treatment plan with the patient. The patient was provided an opportunity to ask questions and all were answered. The patient agreed with the plan and demonstrated an understanding of the instructions.   The patient was advised to call back or seek an in-person evaluation if the symptoms worsen or if the condition fails to improve as anticipated.   [facility] MD OP Progress Note  10/22/2020 5:06 PM Sheryl Perez  MRN:  [id number]  Chief Complaint:  Chief Complaint   Follow-up; Depression; Anxiety    HPI: Sheryl Perez is a 60 year old Caucasian female who has a history of bipolar disorder, borderline personality disorder, cannabis use disorder in remission, generalized anxiety disorder, recent diagnosis of pancreatic adenocarcinoma was evaluated by telemedicine today.  Patient with recent diagnosis of adenocarcinoma of the pancreas, currently under the care of oncology, undergoing chemotherapy.  Reviewed notes per Dr. Lysbeth Penner 10/20/2020-currently on cycle 3 of modified Folfirinox chemotherapy.  Patient today reports overall she has been tolerating her chemotherapy.  She reports she does feel depressed on and off however is not interested in changing her medications at this time.  She is agreeable to referral for psychotherapy  sessions.  She reports sleep is overall okay.  Denies any suicidality, homicidality or perceptual disturbances.  Patient is compliant on medications like Depakote.  She uses the Seroquel only as needed.  Patient reports she has been noncompliant with Depakote level, with everything else going on in her life.  She however agrees to get it done.  Patient appeared to be alert, oriented to person place time and situation and was able to answer questions appropriately.  Patient denies any other concerns today.  Visit Diagnosis:    ICD-10-CM   1. Bipolar disorder, in full remission, most recent episode mixed (HCC)  F31.78 divalproex (DEPAKOTE) 250 MG DR tablet    propranolol (INDERAL) 20 MG tablet    2. GAD (generalized anxiety disorder)  F41.1 divalproex (DEPAKOTE) 250 MG DR tablet    3. Adjustment disorder with mixed anxiety and depressed mood  F43.23     4. Tobacco use disorder  F17.200     5. Borderline personality disorder ([place])  F60.3     6. Cannabis use disorder, moderate, in early remission ([facility])  F12.21     7. High risk medication use  Z79.899 Valproic acid level      Past Psychiatric History: Reviewed past psychiatric history from progress note on 04/12/2017.  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    Bipolar disorder (HCC)    Chest pain, unspecified    Chronic low back pain 10/23/2013   Chronic UTI    COPD (chronic obstructive pulmonary disease) ([facility])  Depression    Dysfunction of eustachian tube    Enlargement of lymph nodes    Family history of brain cancer    Family history of breast cancer    Family history of prostate cancer    History of Bell's palsy Right   Injury, other and unspecified, knee, leg, ankle, and foot    MRSA (methicillin resistant staph aureus) culture positive 07/11/2014    abscess   Other specified disease of hair and hair follicles    Rectal prolapse    Unspecified symptom associated with female genital organs     Past Surgical History:  Procedure Laterality Date   ABDOMINAL HYSTERECTOMY     bladder tack  2010   CHOLECYSTECTOMY  2002   FOOT SURGERY Bilateral 2004   bunion   GANGLION CYST EXCISION  2002   INCISION AND DRAINAGE ABSCESS  04-16-15   PARTIAL HYSTERECTOMY  2010   uterus removed   PORTA CATH INSERTION N/A 09/11/2020   Procedure: PORTA CATH INSERTION;  Surgeon: Algernon Huxley, MD;  Location: [facility];  Service: Cardiovascular;  Laterality: N/A;    Family Psychiatric History: Reviewed family psychiatric history from progress note on 04/12/2017.  Family History:  Family History  Problem Relation Age of Onset   Diabetes Father    Cancer Father        brain tumor   Depression Father    Depression Sister    Alcohol abuse Brother    Depression Brother    Leukemia Maternal Aunt    Prostate cancer Maternal Uncle    Breast cancer Paternal Aunt        dx >50   Heart disease Maternal Grandmother    Heart attack Maternal Grandmother    Stroke Maternal Grandmother    Parkinson's disease Maternal Grandfather    Leukemia Paternal Grandmother    Diabetes Paternal Grandfather    Breast cancer Cousin    Brain cancer Niece        dx under 61    Social History: Manage social history from progress note on 04/12/2017. Social History   Socioeconomic History   Marital status: Married    Spouse name: Not on file   Number of children: 3   Years of education: hs   Highest education level: Some college, no degree  Occupational History   Not on file  Tobacco Use   Smoking status: Former    Packs/day: 0.50    Years: 40.00    Pack years: 20.00    Types: Cigarettes    Quit date: 07/14/2020    Years since quitting: 0.2   Smokeless tobacco: Never  Vaping Use   Vaping Use: Never used  Substance and Sexual Activity   Alcohol use: No     Alcohol/week: 1.0 standard drink    Types: 1 Glasses of wine per week    Comment: occasional   Drug use: Yes    Types: Marijuana    Comment: last used 05-20-15   Sexual activity: Yes    Birth control/protection: None  Other Topics Concern   Not on file  Social History Narrative   Patient lives at home with her husband [name]).   Patient works full time.   Education CNA    Right handed.   Caffeine Tea , Soda.   Social Determinants of Health   Financial Resource Strain: Not on file  Food Insecurity: Not on file  Transportation Needs: Not on file  Physical Activity: Not on file  Stress: Not on file  Social Connections: Not on file    Allergies:  Allergies  Allergen Reactions   Sulfacetamide Sodium Swelling    Diarrhea,dizziness, tongue swelling   Other Other (See Comments) and Swelling    PT states that anything with the sides effects "may cause rash" she cannot take due to her psoriasis, it will make the patches on her hand so thick she can't use her hands.  Diarrhea,dizziness, tongue swelling   Oxycontin [Oxycodone Hcl] Nausea And Vomiting   Sulfa Antibiotics Swelling    Diarrhea,dizziness, tongue swelling   Abilify [Aripiprazole] Other (See Comments)   Cephalosporins Rash   Erythromycin Other (See Comments)    Abd. pain Abd. pain Other reaction(s): Other (See Comments) Abd. pain   Lamotrigine Rash and Other (See Comments)    psorasis worsen psorasis worsen   Risperidone Other (See Comments)    tremors tremors tremors    Metabolic Disorder Labs: Lab Results  Component Value Date   HGBA1C 5.3 08/24/2018   MPG 105.41 08/24/2018   MPG 105.41 04/14/2017   Lab Results  Component Value Date   PROLACTIN 9.3 08/24/2018   PROLACTIN 12.5 04/14/2017   Lab Results  Component Value Date   CHOL 197 12/12/2019   TRIG 68 08/04/2020   HDL 51 12/12/2019   CHOLHDL 3.9 12/12/2019   VLDL 37 08/24/2018   LDLCALC 123 (H) 12/12/2019   LDLCALC 107 (H) 08/24/2018    Lab Results  Component Value Date   TSH 1.27 12/12/2019   TSH 2.376 04/14/2017    Therapeutic Level Labs: Lab Results  Component Value Date   LITHIUM 0.34 (L) 04/14/2017   LITHIUM 0.3 (L) 06/15/2016   Lab Results  Component Value Date   VALPROATE 17 (L) 02/22/2020   VALPROATE 31 (L) 10/05/2019   No components found for:  CBMZ  Current Medications: Current Outpatient Medications  Medication Sig Dispense Refill   albuterol (VENTOLIN HFA) 108 (90 Base) MCG/ACT inhaler Inhale 2 puffs into the lungs every 4 (four) hours as needed for wheezing or shortness of breath. 8 g 4   ALPRAZolam (XANAX) 0.25 MG tablet Take by mouth.     ALPRAZolam (XANAX) 0.5 MG tablet Take 1 tablet (0.5 mg total) by mouth 2 (two) times daily as needed for anxiety. 20 tablet 1   Ascorbic Acid (VITAMIN C) 1000 MG tablet Take 1,000 mg by mouth daily.     cyclobenzaprine (FLEXERIL) 5 MG tablet Take 5 mg by mouth 2 (two) times daily at 10 AM and 5 PM.  (Patient not taking: No sig reported)     dexamethasone (DECADRON) 4 MG tablet Take 2 tablets (8 mg total) by mouth daily. Start the day after chemotherapy for 3 days. Take with food. 8 tablet 5   divalproex (DEPAKOTE) 250 MG DR tablet Take 1 tablet (250 mg total) by mouth 2 (two) times daily. 180 tablet 1   famotidine (PEPCID) 20 MG tablet Take by mouth. (Patient not taking: No sig reported)     ferrous sulfate 325 (65 FE) MG tablet Take 325 mg by mouth 2 (two) times daily with a meal.     fluticasone (FLONASE) 50 MCG/ACT nasal spray Place 1 spray into both nostrils daily. 11.1 mL 2   lidocaine-prilocaine (EMLA) cream Apply to affected area once 30 g 3   loperamide (IMODIUM A-D) 2 MG tablet Take 2 at onset of diarrhea, then 1 every 2hrs until 12hr without a BM. May take 2 tab every 4hrs at bedtime.  If diarrhea recurs repeat. 100 tablet 1   loratadine (CLARITIN) 10 MG tablet TAKE 1 TABLET BY MOUTH EVERY DAY 90 tablet 1   Multiple Vitamin (MULTIVITAMIN) tablet Take 1  tablet by mouth daily.     naproxen sodium (ALEVE) 220 MG tablet Take by mouth.     ondansetron (ZOFRAN) 8 MG tablet Take 1 tablet (8 mg total) by mouth 2 (two) times daily as needed. Start on day 3 after chemotherapy. (Patient not taking: No sig reported) 30 tablet 1   ondansetron (ZOFRAN-ODT) 4 MG disintegrating tablet  (Patient not taking: No sig reported)     pantoprazole (PROTONIX) 40 MG tablet  (Patient not taking: No sig reported)     potassium chloride SA (KLOR-CON) 20 MEQ tablet Take 1 tablet (20 mEq total) by mouth daily. 21 tablet 0   prochlorperazine (COMPAZINE) 10 MG tablet Take 1 tablet (10 mg total) by mouth every 6 (six) hours as needed (Nausea or vomiting). (Patient not taking: No sig reported) 30 tablet 1   propranolol (INDERAL) 20 MG tablet Take 1 tablet (20 mg total) by mouth 3 (three) times daily. 270 tablet 1   QUEtiapine (SEROQUEL) 50 MG tablet Take 1 tablet (50 mg total) by mouth at bedtime as needed. 90 tablet 0   triamcinolone cream (KENALOG) 0.1 % Apply 1 application topically 2 (two) times daily. 30 g 0   No current facility-administered medications for this visit.     Musculoskeletal: Strength & Muscle Tone:  UTA Gait & Station:  UTA Patient leans: N/A  Psychiatric Specialty Exam: Review of Systems  Gastrointestinal:  Positive for nausea.  Psychiatric/Behavioral:  Positive for dysphoric mood. The patient is nervous/anxious.   All other systems reviewed and are negative.  Last menstrual period 02/09/2008.There is no height or weight on file to calculate BMI.  General Appearance:  UTA  Eye Contact:   UTA  Speech:  Clear and Coherent  Volume:  Normal  Mood:  Anxious, depressed  Affect:   UTA  Thought Process:  Goal Directed and Descriptions of Associations: Intact  Orientation:  Full (Time, Place, and Person)  Thought Content: Logical   Suicidal Thoughts:  No  Homicidal Thoughts:  No  Memory:  Immediate;   Fair Recent;   Fair Remote;   Fair   Judgement:  Fair  Insight:  Fair  Psychomotor Activity:   UTA  Concentration:  Concentration: Fair and Attention Span: Fair  Recall:  [employer] of Knowledge: Fair  Language: Fair  Akathisia:  No  Handed:  Right  AIMS (if indicated): done  Assets:  Communication Skills Desire for Improvement Housing Social Support Transportation  ADL's:  Intact  Cognition: WNL  Sleep:  Fair   Screenings: AIMS    Flowsheet Row Video Visit from 04/21/2020 in [facility] Office Visit from 05/03/2016 in [facility] Admission (Discharged) from 01/15/2015 in [facility] Total Score 0 0 0      AUDIT    Flowsheet Row Admission (Discharged) from 01/15/2015 in [facility]  Alcohol Use Disorder Identification Test Final Score (AUDIT) 1      GAD-7    Flowsheet Row Video Visit from 09/08/2020 in [facility] Office Visit from 08/19/2020 in [facility] Office Visit from 04/18/2015 in [facility]  Total GAD-7 Score '6 2 14      '$ PHQ2-9    Flowsheet Row Video Visit from 10/22/2020 in [facility] Video Visit from 09/08/2020 in [facility] Office Visit from 08/19/2020 in [facility] Video Visit from 04/21/2020 in [facility] Office Visit from 12/14/2019 in [facility]  PHQ-2 Total Score 0 2 0 0 0  PHQ-9 Total Score 2 5 0 -- --      Flowsheet Row Video Visit from 09/08/2020 in [facility] Office Visit from 08/19/2020 in [facility] ED from 08/04/2020 in [facility] CATEGORY No Risk No Risk No Risk        Assessment and Plan: Sheryl Perez is a 60 year old Caucasian female who has a history of  bipolar disorder, cannabis use disorder, tobacco use disorder, psoriatic arthritis was evaluated by telemedicine today.  Patient with recent diagnosis of pancreatic adenocarcinoma, currently under the care of oncology on antineoplastic chemotherapy.  Patient does report depression as well as fatigue low energy possibly due to being on chemotherapy.  Patient will benefit from medication readjustment as well as psychotherapy sessions.  Plan Bipolar disorder in remission Continue Depakote 250 mg p.o. twice daily Seroquel 50 mg p.o. nightly as needed Patient has been noncompliant with Depakote level-encouraged to get it done.  Will order it again.  Adjustment disorder with mixed anxiety and depressed mood-unstable Discussed adding an SSRI/SNRI-she is not interested. I have referred her to psychotherapist, Ms. Christina Hussami -patient encouraged to schedule an appointment to start therapy.  GAD-unstable Referral for CBT Depakote 250 mg p.o. twice daily Propranolol 10 mg p.o. 3 times daily Hydroxyzine 10 mg p.o. twice daily as needed for severe anxiety attacks   High risk medication use-will order Depakote level again.  Patient has been noncompliant  I have reviewed notes per Dr. Tyrone Schimke noted above.  Follow-up in clinic in person in 4 weeks.   I have spent at least 20 minutes non face to face with patient today.    This note was generated in part or whole with voice recognition software. Voice recognition is usually quite accurate but there are transcription errors that can and very often do occur. I apologize for any typographical errors that were not detected and corrected.      Ursula Alert, MD 10/23/2020, 8:30 AM

## 2020-10-31 ENCOUNTER — Inpatient Hospital Stay: Payer: BC Managed Care – PPO

## 2020-10-31 ENCOUNTER — Inpatient Hospital Stay (HOSPITAL_BASED_OUTPATIENT_CLINIC_OR_DEPARTMENT_OTHER): Payer: BC Managed Care – PPO | Admitting: Oncology

## 2020-10-31 ENCOUNTER — Encounter: Payer: Self-pay | Admitting: Oncology

## 2020-10-31 VITALS — BP 110/44 | HR 64 | Temp 97.1°F | Resp 14 | Wt 113.8 lb

## 2020-10-31 DIAGNOSIS — T451X5A Adverse effect of antineoplastic and immunosuppressive drugs, initial encounter: Secondary | ICD-10-CM | POA: Diagnosis not present

## 2020-10-31 DIAGNOSIS — Z808 Family history of malignant neoplasm of other organs or systems: Secondary | ICD-10-CM | POA: Diagnosis not present

## 2020-10-31 DIAGNOSIS — C259 Malignant neoplasm of pancreas, unspecified: Secondary | ICD-10-CM

## 2020-10-31 DIAGNOSIS — Z79899 Other long term (current) drug therapy: Secondary | ICD-10-CM | POA: Diagnosis not present

## 2020-10-31 DIAGNOSIS — Z818 Family history of other mental and behavioral disorders: Secondary | ICD-10-CM | POA: Diagnosis not present

## 2020-10-31 DIAGNOSIS — Z95828 Presence of other vascular implants and grafts: Secondary | ICD-10-CM

## 2020-10-31 DIAGNOSIS — Z806 Family history of leukemia: Secondary | ICD-10-CM | POA: Diagnosis not present

## 2020-10-31 DIAGNOSIS — D6481 Anemia due to antineoplastic chemotherapy: Secondary | ICD-10-CM | POA: Diagnosis not present

## 2020-10-31 DIAGNOSIS — Z8042 Family history of malignant neoplasm of prostate: Secondary | ICD-10-CM | POA: Diagnosis not present

## 2020-10-31 DIAGNOSIS — F319 Bipolar disorder, unspecified: Secondary | ICD-10-CM | POA: Diagnosis not present

## 2020-10-31 DIAGNOSIS — Z5111 Encounter for antineoplastic chemotherapy: Secondary | ICD-10-CM

## 2020-10-31 DIAGNOSIS — Z5189 Encounter for other specified aftercare: Secondary | ICD-10-CM | POA: Diagnosis not present

## 2020-10-31 DIAGNOSIS — Z87891 Personal history of nicotine dependence: Secondary | ICD-10-CM | POA: Diagnosis not present

## 2020-10-31 DIAGNOSIS — Z811 Family history of alcohol abuse and dependence: Secondary | ICD-10-CM | POA: Diagnosis not present

## 2020-10-31 DIAGNOSIS — Z803 Family history of malignant neoplasm of breast: Secondary | ICD-10-CM | POA: Diagnosis not present

## 2020-10-31 DIAGNOSIS — C257 Malignant neoplasm of other parts of pancreas: Secondary | ICD-10-CM | POA: Diagnosis not present

## 2020-10-31 DIAGNOSIS — R5383 Other fatigue: Secondary | ICD-10-CM | POA: Diagnosis not present

## 2020-10-31 DIAGNOSIS — Z833 Family history of diabetes mellitus: Secondary | ICD-10-CM | POA: Diagnosis not present

## 2020-10-31 LAB — CBC WITH DIFFERENTIAL/PLATELET
Abs Immature Granulocytes: 1.43 10*3/uL — ABNORMAL HIGH (ref 0.00–0.07)
Basophils Absolute: 0 10*3/uL (ref 0.0–0.1)
Basophils Relative: 0 %
Eosinophils Absolute: 0.2 10*3/uL (ref 0.0–0.5)
Eosinophils Relative: 1 %
HCT: 32.5 % — ABNORMAL LOW (ref 36.0–46.0)
Hemoglobin: 10.6 g/dL — ABNORMAL LOW (ref 12.0–15.0)
Immature Granulocytes: 9 %
Lymphocytes Relative: 15 %
Lymphs Abs: 2.3 10*3/uL (ref 0.7–4.0)
MCH: 32.8 pg (ref 26.0–34.0)
MCHC: 32.6 g/dL (ref 30.0–36.0)
MCV: 100.6 fL — ABNORMAL HIGH (ref 80.0–100.0)
Monocytes Absolute: 1.2 10*3/uL — ABNORMAL HIGH (ref 0.1–1.0)
Monocytes Relative: 8 %
Neutro Abs: 10.2 10*3/uL — ABNORMAL HIGH (ref 1.7–7.7)
Neutrophils Relative %: 67 %
Platelets: 219 10*3/uL (ref 150–400)
RBC: 3.23 MIL/uL — ABNORMAL LOW (ref 3.87–5.11)
RDW: 14.8 % (ref 11.5–15.5)
WBC: 15.3 10*3/uL — ABNORMAL HIGH (ref 4.0–10.5)
nRBC: 0.3 % — ABNORMAL HIGH (ref 0.0–0.2)

## 2020-10-31 LAB — COMPREHENSIVE METABOLIC PANEL
ALT: 14 U/L (ref 0–44)
AST: 26 U/L (ref 15–41)
Albumin: 3.9 g/dL (ref 3.5–5.0)
Alkaline Phosphatase: 102 U/L (ref 38–126)
Anion gap: 9 (ref 5–15)
BUN: 9 mg/dL (ref 6–20)
CO2: 25 mmol/L (ref 22–32)
Calcium: 8.9 mg/dL (ref 8.9–10.3)
Chloride: 106 mmol/L (ref 98–111)
Creatinine, Ser: 0.77 mg/dL (ref 0.44–1.00)
GFR, Estimated: >60 mL/min (ref >60)
Glucose, Bld: 107 mg/dL — ABNORMAL HIGH (ref 70–99)
Potassium: 3.6 mmol/L (ref 3.5–5.1)
Sodium: 140 mmol/L (ref 135–145)
Total Bilirubin: 0.3 mg/dL (ref 0.3–1.2)
Total Protein: 7.2 g/dL (ref 6.5–8.1)

## 2020-10-31 MED ORDER — SODIUM CHLORIDE 0.9 % IV SOLN
150.0000 mg/m2 | Freq: Once | INTRAVENOUS | Status: AC
  Administered 2020-10-31: 220 mg via INTRAVENOUS
  Filled 2020-10-31: qty 10

## 2020-10-31 MED ORDER — SODIUM CHLORIDE 0.9% FLUSH
10.0000 mL | Freq: Once | INTRAVENOUS | Status: AC
  Administered 2020-10-31: 10 mL via INTRAVENOUS
  Filled 2020-10-31: qty 10

## 2020-10-31 MED ORDER — DEXAMETHASONE SODIUM PHOSPHATE 100 MG/10ML IJ SOLN
10.0000 mg | Freq: Once | INTRAMUSCULAR | Status: AC
  Administered 2020-10-31: 10 mg via INTRAVENOUS
  Filled 2020-10-31: qty 10

## 2020-10-31 MED ORDER — ATROPINE SULFATE 1 MG/ML IJ SOLN
0.5000 mg | Freq: Once | INTRAMUSCULAR | Status: AC | PRN
  Administered 2020-10-31: 0.5 mg via INTRAVENOUS
  Filled 2020-10-31: qty 1

## 2020-10-31 MED ORDER — SODIUM CHLORIDE 0.9 % IV SOLN
150.0000 mg | Freq: Once | INTRAVENOUS | Status: AC
  Administered 2020-10-31: 150 mg via INTRAVENOUS
  Filled 2020-10-31: qty 150

## 2020-10-31 MED ORDER — OXALIPLATIN CHEMO INJECTION 100 MG/20ML
65.0000 mg/m2 | Freq: Once | INTRAVENOUS | Status: AC
  Administered 2020-10-31: 100 mg via INTRAVENOUS
  Filled 2020-10-31: qty 20

## 2020-10-31 MED ORDER — SODIUM CHLORIDE 0.9 % IV SOLN
600.0000 mg | Freq: Once | INTRAVENOUS | Status: AC
  Administered 2020-10-31: 600 mg via INTRAVENOUS
  Filled 2020-10-31: qty 30

## 2020-10-31 MED ORDER — SODIUM CHLORIDE 0.9 % IV SOLN
Freq: Once | INTRAVENOUS | Status: AC
Start: 2020-10-31 — End: 2020-10-31
  Filled 2020-10-31: qty 250

## 2020-10-31 MED ORDER — FLUOROURACIL CHEMO INJECTION 5 GM/100ML
2400.0000 mg/m2 | INTRAVENOUS | Status: DC
  Administered 2020-10-31: 3600 mg via INTRAVENOUS
  Filled 2020-10-31: qty 72

## 2020-10-31 MED ORDER — HEPARIN SOD (PORK) LOCK FLUSH 100 UNIT/ML IV SOLN
500.0000 [IU] | Freq: Once | INTRAVENOUS | Status: DC
  Filled 2020-10-31: qty 5

## 2020-10-31 MED ORDER — PALONOSETRON HCL INJECTION 0.25 MG/5ML
0.2500 mg | Freq: Once | INTRAVENOUS | Status: AC
  Administered 2020-10-31: 0.25 mg via INTRAVENOUS
  Filled 2020-10-31: qty 5

## 2020-10-31 MED ORDER — DEXTROSE 5 % IV SOLN
Freq: Once | INTRAVENOUS | Status: AC
  Filled 2020-10-31: qty 250

## 2020-10-31 NOTE — Patient Instructions (Signed)
[facility] ONCOLOGY  Discharge Instructions: Thank you for choosing [facility] to provide your oncology and hematology care.  If you have a lab appointment with the [facility], please go directly to the [facility] and check in at the registration area.  Wear comfortable clothing and clothing appropriate for easy access to any Portacath or PICC line.   We strive to give you quality time with your provider. You may need to reschedule your appointment if you arrive late (15 or more minutes).  Arriving late affects you and other patients whose appointments are after yours.  Also, if you miss three or more appointments without notifying the office, you may be dismissed from the clinic at the provider's discretion.      For prescription refill requests, have your pharmacy contact our office and allow 72 hours for refills to be completed.    Today you received the following chemotherapy and/or immunotherapy agents Oxaliplatin, Irinotecan, Leucovorin and Adrucil       To help prevent nausea and vomiting after your treatment, we encourage you to take your nausea medication as directed.  BELOW ARE SYMPTOMS THAT SHOULD BE REPORTED IMMEDIATELY: *FEVER GREATER THAN 100.4 F (38 C) OR HIGHER *CHILLS OR SWEATING *NAUSEA AND VOMITING THAT IS NOT CONTROLLED WITH YOUR NAUSEA MEDICATION *UNUSUAL SHORTNESS OF BREATH *UNUSUAL BRUISING OR BLEEDING *URINARY PROBLEMS (pain or burning when urinating, or frequent urination) *BOWEL PROBLEMS (unusual diarrhea, constipation, pain near the anus) TENDERNESS IN MOUTH AND THROAT WITH OR WITHOUT PRESENCE OF ULCERS (sore throat, sores in mouth, or a toothache) UNUSUAL RASH, SWELLING OR PAIN  UNUSUAL VAGINAL DISCHARGE OR ITCHING   Items with * indicate a potential emergency and should be followed up as soon as possible or go to the Emergency Department if any problems should occur.  Please show the CHEMOTHERAPY ALERT CARD or  IMMUNOTHERAPY ALERT CARD at check-in to the Emergency Department and triage nurse.  Should you have questions after your visit or need to cancel or reschedule your appointment, please contact [facility]  [phone] and follow the prompts.  Office hours are 8:00 a.m. to 4:30 p.m. Monday - Friday. Please note that voicemails left after 4:00 p.m. may not be returned until the following business day.  We are closed weekends and major holidays. You have access to a nurse at all times for urgent questions. Please call the main number to the clinic [phone] and follow the prompts.  For any non-urgent questions, you may also contact your provider using MyChart. We now offer e-Visits for anyone 65 and older to request care online for non-urgent symptoms. For details visit [email or web address].   Also download the MyChart app! Go to the app store, search "MyChart", open the app, select [facility], and log in with your MyChart username and password.  Due to Covid, a mask is required upon entering the hospital/clinic. If you do not have a mask, one will be given to you upon arrival. For doctor visits, patients may have 1 support person aged 5 or older with them. For treatment visits, patients cannot have anyone with them due to current Covid guidelines and our immunocompromised population.    The chemotherapy medication bag should finish at 72Hours For example, if your pump is scheduled for 46 hours and it was put on at 4:00 p.m., it should finish at 2:00 p.m. the day it is scheduled to come off regardless of your appointment time.  Estimated time to finish at 3:15pm.   If the display on your pump reads "Low Volume" and it is beeping, take the batteries out of the pump and come to the cancer center for it to be taken off.   If the pump alarms go off prior to the pump reading "Low Volume" then call [phone] and someone can assist you.  If the plunger  comes out and the chemotherapy medication is leaking out, please use your home chemo spill kit to clean up the spill. Do NOT use paper towels or other household products.  If you have problems or questions regarding your pump, please call either [phone] (24 hours a day) or the cancer center Monday-Friday 8:00 a.m.- 4:30 p.m. at the clinic number and we will assist you. If you are unable to get assistance, then go to the nearest Emergency Department and ask the staff to contact the IV team for assistance.

## 2020-10-31 NOTE — Progress Notes (Signed)
Nutrition Follow-up:  Patient with stage I pancreatic cancer.  Patient receiving folfirinox, neoadjuvant  Met with patient during infusion.  Patient reports that her husband has been encouraging her to eat.  She has been taking a boost shake and eating pudding at work.  Says that she has tried some of the frozen meals at lunch time. 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      Medications: reviewed  Labs: reviewed  Anthropometrics:   Weight 113 lb 12.8 oz today  111 lb 9.6 oz on 9/9  113 lb 4.8 oz on 8/19  UBW of 120-125 lb   NUTRITION DIAGNOSIS: Unintentional weight loss stable    INTERVENTION:  Patient has not switched to 360 calorie boost shake as could not find it in the store.   Continue high calorie, high protein foods Congratulated patient on weight gain    MONITORING, EVALUATION, GOAL: weight trends, intake   NEXT VISIT: Friday, Oct 7 during infusion  Hollyann Pablo B. Zenia Resides, [place], [place] Registered Dietitian [phone] (mobile)

## 2020-10-31 NOTE — Progress Notes (Signed)
Hematology/Oncology Consult note [facility]  [phone] [phone]  Patient Care Team: Cletis Athens, MD as PCP - General (Internal Medicine) Grant Fontana, [place] as Referring Physician (Chiropractic Medicine) Christene Lye, MD (General Surgery) Sindy Guadeloupe, MD as Consulting Physician (Hematology and Oncology)   Name of the patient: Sheryl Perez  [id number]  May 05, 1960   Date of visit: 10/31/20  Diagnosis- stage I pancreatic cancer  Chief complaint/ Reason for visit-on treatment assessment prior to cycle 4 of neoadjuvant modified FOLFIRINOX chemotherapy  Heme/Onc history: patient is a 60 year old female with a history of irritable bowel disease for which she follows up with [facility].  She has undergone EGD and colonoscopy with them in the past.  More recently patient was complaining of left upper quadrant and epigastric abdominal pain which causes sudden episodes of abdominal spasms and was prescribed as needed tramadol for it.  These episodes of pain are unrelated to food and are dull aching or throbbing.  She has undergone cholecystectomy in the past.  She underwent CT abdomen and pelvis with contrast in the ER on 08/04/2020 Which did not reveal any acute pathology which showed a possible hypoechoic 1.9 cm right hepatic lobe lesion.  This was followed by an MRI which showed no discrete lesion in the liver and the area of concern noted on CT scan was more likely to represent variable fat deposition.  However there was loss of normal T1 signal in the peripheral pancreas and a potential small pancreatic lesion suspicious for early pancreatic adenocarcinoma in the neck of the pancreas.     Patient underwent EUS by Dr. Mont Dutton which showed an irregular mass in the pancreatic neck measuring 15 x 11 mm.  FNA showed adenocarcinoma.  Pancreatic duct measured 1.1 mm in the head with abrupt dilatation in the region of the mass to 4.1 mm.  No  abnormality noted in the common bile duct and hepatic duct.  Celiac region showed no significant endosonographic abnormality.  No lymphadenopathy was seen.  CA 19-9 mildly elevated at 35   Patient was seen by Dr. Hyman Hopes from pancreaticobiliary surgery at [facility] for surgical opinion.  Although patient has a small lesion involving the pancreatic neck, there was a concern for tumor extension towards the splenic vein and narrowing of portal splenic confluence.  Neoadjuvant chemotherapy for 3 months was therefore favored    Interval history-patient is in better spirits today and her mood was better today.  She states that she is eating better and has gained a couple of pounds.  Denies any significant nausea or vomiting with present treatment.  Denies any significant persistent tingling numbness in her extremities.  ECOG PS- 1 Pain scale- 0   Review of systems- Review of Systems  Constitutional:  Positive for malaise/fatigue. Negative for chills, fever and weight loss.  HENT:  Negative for congestion, ear discharge and nosebleeds.   Eyes:  Negative for blurred vision.  Respiratory:  Negative for cough, hemoptysis, sputum production, shortness of breath and wheezing.   Cardiovascular:  Negative for chest pain, palpitations, orthopnea and claudication.  Gastrointestinal:  Negative for abdominal pain, blood in stool, constipation, diarrhea, heartburn, melena, nausea and vomiting.  Genitourinary:  Negative for dysuria, flank pain, frequency, hematuria and urgency.  Musculoskeletal:  Negative for back pain, joint pain and myalgias.  Skin:  Negative for rash.  Neurological:  Negative for dizziness, tingling, focal weakness, seizures, weakness and headaches.  Endo/Heme/Allergies:  Does not bruise/bleed easily.  Psychiatric/Behavioral:  Negative for depression and suicidal ideas. The patient does not have insomnia.      Allergies  Allergen Reactions   Sulfacetamide Sodium Swelling     Diarrhea,dizziness, tongue swelling   Other Other (See Comments) and Swelling    PT states that anything with the sides effects "may cause rash" she cannot take due to her psoriasis, it will make the patches on her hand so thick she can't use her hands.  Diarrhea,dizziness, tongue swelling   Oxycontin [Oxycodone Hcl] Nausea And Vomiting   Sulfa Antibiotics Swelling    Diarrhea,dizziness, tongue swelling   Abilify [Aripiprazole] Other (See Comments)   Cephalosporins Rash   Erythromycin Other (See Comments)    Abd. pain Abd. pain Other reaction(s): Other (See Comments) Abd. 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    Bipolar disorder ([place])    Chest pain, unspecified    Chronic low back pain 10/23/2013   Chronic UTI    COPD (chronic obstructive pulmonary disease) (HCC)    Depression    Dysfunction of eustachian tube    Enlargement of lymph nodes    Family history of brain cancer    Family history of breast cancer    Family history of prostate cancer    History of Bell's palsy Right   Injury, other and unspecified, knee, leg, ankle, and foot    MRSA (methicillin resistant staph aureus) culture positive 07/11/2014   abscess   Other specified disease of hair and hair follicles    Rectal prolapse    Unspecified symptom associated with female genital organs      Past Surgical History:  Procedure Laterality Date   ABDOMINAL HYSTERECTOMY     bladder tack  2010   CHOLECYSTECTOMY  2002   FOOT SURGERY Bilateral 2004   bunion   GANGLION CYST EXCISION  2002   INCISION AND DRAINAGE ABSCESS  04-16-15   PARTIAL HYSTERECTOMY  2010   uterus removed   PORTA CATH INSERTION N/A 09/11/2020   Procedure: PORTA CATH INSERTION;  Surgeon: Algernon Huxley, MD;  Location: [facility];  Service: Cardiovascular;  Laterality: N/A;    Social History   Socioeconomic History   Marital status: Married    Spouse name: Not on file   Number of children: 3   Years of education: hs   Highest education level: Some college, no degree  Occupational History   Not on file  Tobacco Use   Smoking status: Former    Packs/day: 0.50    Years: 40.00    Pack years: 20.00    Types: Cigarettes    Quit date: 07/14/2020    Years since quitting: 0.2   Smokeless tobacco: Never  Vaping Use   Vaping Use: Never used  Substance and Sexual Activity   Alcohol use: No    Alcohol/week: 1.0 standard drink    Types: 1 Glasses of wine per week    Comment: occasional   Drug use: Yes    Types: Marijuana    Comment: last used 05-20-15   Sexual activity: Yes    Birth control/protection: None  Other Topics Concern   Not on file  Social History Narrative   Patient lives at home with her husband (  [name]).   Patient works full time.   Education CNA    Right handed.   Caffeine Tea , Soda.   Social Determinants of Health   Financial Resource Strain: Not on file  Food Insecurity: Not on file  Transportation Needs: Not on file  Physical Activity: Not on file  Stress: Not on file  Social Connections: Not on file  Intimate Partner Violence: Not on file    Family History  Problem Relation Age of Onset   Diabetes Father    Cancer Father        brain tumor   Depression Father    Depression Sister    Alcohol abuse Brother    Depression Brother    Leukemia Maternal Aunt    Prostate cancer Maternal Uncle    Breast cancer Paternal Aunt        dx >50   Heart disease Maternal Grandmother    Heart attack Maternal Grandmother    Stroke Maternal Grandmother    Parkinson's disease Maternal Grandfather    Leukemia Paternal Grandmother    Diabetes Paternal Grandfather    Breast cancer Cousin    Brain cancer Niece        dx under 58     Current Outpatient Medications:     albuterol (VENTOLIN HFA) 108 (90 Base) MCG/ACT inhaler, Inhale 2 puffs into the lungs every 4 (four) hours as needed for wheezing or shortness of breath., Disp: 8 g, Rfl: 4   ALPRAZolam (XANAX) 0.25 MG tablet, Take by mouth., Disp: , Rfl:    ALPRAZolam (XANAX) 0.5 MG tablet, Take 1 tablet (0.5 mg total) by mouth 2 (two) times daily as needed for anxiety., Disp: 20 tablet, Rfl: 1   Ascorbic Acid (VITAMIN C) 1000 MG tablet, Take 1,000 mg by mouth daily., Disp: , Rfl:    dexamethasone (DECADRON) 4 MG tablet, Take 2 tablets (8 mg total) by mouth daily. Start the day after chemotherapy for 3 days. Take with food., Disp: 8 tablet, Rfl: 5   divalproex (DEPAKOTE) 250 MG DR tablet, Take 1 tablet (250 mg total) by mouth 2 (two) times daily., Disp: 180 tablet, Rfl: 1   ferrous sulfate 325 (65 FE) MG tablet, Take 325 mg by mouth 2 (two) times daily with a meal., Disp: , Rfl:    lidocaine-prilocaine (EMLA) cream, Apply to affected area once, Disp: 30 g, Rfl: 3   loperamide (IMODIUM A-D) 2 MG tablet, Take 2 at onset of diarrhea, then 1 every 2hrs until 12hr without a BM. May take 2 tab every 4hrs at bedtime. If diarrhea recurs repeat., Disp: 100 tablet, Rfl: 1   loratadine (CLARITIN) 10 MG tablet, TAKE 1 TABLET BY MOUTH EVERY DAY, Disp: 90 tablet, Rfl: 1   Multiple Vitamin (MULTIVITAMIN) tablet, Take 1 tablet by mouth daily., Disp: , Rfl:    naproxen sodium (ALEVE) 220 MG tablet, Take by mouth., Disp: , Rfl:    ondansetron (ZOFRAN) 8 MG tablet, Take 1 tablet (8 mg total) by mouth 2 (two) times daily as needed. Start on day 3 after chemotherapy., Disp: 30 tablet, Rfl: 1   pantoprazole (PROTONIX) 40 MG tablet, , Disp: , Rfl:    potassium chloride SA (KLOR-CON) 20 MEQ tablet, Take 1 tablet (20 mEq total) by mouth daily., Disp: 21 tablet, Rfl: 0   propranolol (INDERAL) 20 MG tablet, Take 1 tablet (20 mg total) by mouth 3 (three) times daily., Disp: 270 tablet, Rfl: 1   cyclobenzaprine (FLEXERIL) 5 MG tablet, Take 5  mg  by mouth 2 (two) times daily at 10 AM and 5 PM.  (Patient not taking: No sig reported), Disp: , Rfl:    famotidine (PEPCID) 20 MG tablet, Take by mouth. (Patient not taking: No sig reported), Disp: , Rfl:    fluticasone (FLONASE) 50 MCG/ACT nasal spray, Place 1 spray into both nostrils daily. (Patient not taking: Reported on 10/31/2020), Disp: 11.1 mL, Rfl: 2   ondansetron (ZOFRAN-ODT) 4 MG disintegrating tablet, , Disp: , Rfl:    prochlorperazine (COMPAZINE) 10 MG tablet, Take 1 tablet (10 mg total) by mouth every 6 (six) hours as needed (Nausea or vomiting). (Patient not taking: Reported on 10/31/2020), Disp: 30 tablet, Rfl: 1   QUEtiapine (SEROQUEL) 50 MG tablet, Take 1 tablet (50 mg total) by mouth at bedtime as needed. (Patient not taking: Reported on 10/31/2020), Disp: 90 tablet, Rfl: 0   triamcinolone cream (KENALOG) 0.1 %, Apply 1 application topically 2 (two) times daily. (Patient not taking: Reported on 10/31/2020), Disp: 30 g, Rfl: 0 No current facility-administered medications for this visit.  Facility-Administered Medications Ordered in Other Visits:    fluorouracil (ADRUCIL) 3,600 mg in sodium chloride 0.9 % 78 mL chemo infusion, 2,400 mg/m2 (Treatment Plan Recorded), Intravenous, 1 day or 1 dose, Sindy Guadeloupe, MD, 3,600 mg at 10/31/20 1514   heparin lock flush 100 unit/mL, 500 Units, Intravenous, Once, Sindy Guadeloupe, MD  Physical exam:  Vitals:   10/31/20 0930  BP: (!) 110/44  Pulse: 64  Resp: 14  Temp: (!) 97.1 F (36.2 C)  SpO2: 100%  Weight: 113 lb 12.8 oz (51.6 kg)   Physical Exam Constitutional:      General: She is not in acute distress. Cardiovascular:     Rate and Rhythm: Normal rate and regular rhythm.     Heart sounds: Normal heart sounds.  Pulmonary:     Effort: Pulmonary effort is normal.     Breath sounds: Normal breath sounds.  Abdominal:     General: Bowel sounds are normal.     Palpations: Abdomen is soft.  Skin:    General: Skin is warm and dry.   Neurological:     Mental Status: She is alert and oriented to person, place, and time.     CMP Latest Ref Rng & Units 10/31/2020  Glucose 70 - 99 mg/dL 107(H)  BUN 6 - 20 mg/dL 9  Creatinine 0.44 - 1.00 mg/dL 0.77  Sodium 135 - 145 mmol/L 140  Potassium 3.5 - 5.1 mmol/L 3.6  Chloride 98 - 111 mmol/L 106  CO2 22 - 32 mmol/L 25  Calcium 8.9 - 10.3 mg/dL 8.9  Total Protein 6.5 - 8.1 g/dL 7.2  Total Bilirubin 0.3 - 1.2 mg/dL 0.3  Alkaline Phos 38 - 126 U/L 102  AST 15 - 41 U/L 26  ALT 0 - 44 U/L 14   CBC Latest Ref Rng & Units 10/31/2020  WBC 4.0 - 10.5 K/uL 15.3(H)  Hemoglobin 12.0 - 15.0 g/dL 10.6(L)  Hematocrit 36.0 - 46.0 % 32.5(L)  Platelets 150 - 400 K/uL 219    Assessment and plan- Patient is a 60 y.o. female with stage I pancreatic neck adenocarcinoma cT1 cN0 M0 borderline resectable.  She is here for on treatment assessment prior to cycle 4 of neoadjuvant modified FOLFIRINOX chemotherapy  White cell count is elevated at 15.3 today likely secondary to [facility] that she received 2 weeks ago.  Counts otherwise okay to proceed with cycle 4 of treatment today but she  will not receive Udenyca with this cycle.  She will be seen by covering NP in 2 weeks for cycle 5 and I will see her back in 4 weeks for cycle 6.  I will reach out to Dr. Hyman Hopes from [facility] pancreaticobiliary regarding scheduling a repeat CT scan and follow-up at [facility] after 3 months of chemotherapy to see if she can proceed with surgery or if we need to finish up 3 more months of neoadjuvant chemotherapy.  Chemo induced anemia: Continue to monitor.  She recently had ferritin and iron studies B12 and folate levels done which were all normal.   Visit Diagnosis 1. Pancreatic adenocarcinoma ([place])   2. Encounter for antineoplastic chemotherapy      Dr. Randa Evens, MD, MPH [facility] [phone] 10/31/2020 4:27 PM

## 2020-11-03 ENCOUNTER — Other Ambulatory Visit: Payer: Self-pay | Admitting: Oncology

## 2020-11-03 ENCOUNTER — Inpatient Hospital Stay: Payer: BC Managed Care – PPO

## 2020-11-03 ENCOUNTER — Other Ambulatory Visit: Payer: Self-pay

## 2020-11-03 VITALS — BP 111/72 | HR 84 | Temp 97.0°F | Resp 18

## 2020-11-03 DIAGNOSIS — Z833 Family history of diabetes mellitus: Secondary | ICD-10-CM | POA: Diagnosis not present

## 2020-11-03 DIAGNOSIS — Z5189 Encounter for other specified aftercare: Secondary | ICD-10-CM | POA: Diagnosis not present

## 2020-11-03 DIAGNOSIS — Z5111 Encounter for antineoplastic chemotherapy: Secondary | ICD-10-CM | POA: Diagnosis not present

## 2020-11-03 DIAGNOSIS — D6481 Anemia due to antineoplastic chemotherapy: Secondary | ICD-10-CM | POA: Diagnosis not present

## 2020-11-03 DIAGNOSIS — C257 Malignant neoplasm of other parts of pancreas: Secondary | ICD-10-CM | POA: Diagnosis not present

## 2020-11-03 DIAGNOSIS — Z811 Family history of alcohol abuse and dependence: Secondary | ICD-10-CM | POA: Diagnosis not present

## 2020-11-03 DIAGNOSIS — Z803 Family history of malignant neoplasm of breast: Secondary | ICD-10-CM | POA: Diagnosis not present

## 2020-11-03 DIAGNOSIS — Z818 Family history of other mental and behavioral disorders: Secondary | ICD-10-CM | POA: Diagnosis not present

## 2020-11-03 DIAGNOSIS — T451X5A Adverse effect of antineoplastic and immunosuppressive drugs, initial encounter: Secondary | ICD-10-CM | POA: Diagnosis not present

## 2020-11-03 DIAGNOSIS — Z79899 Other long term (current) drug therapy: Secondary | ICD-10-CM | POA: Diagnosis not present

## 2020-11-03 DIAGNOSIS — C259 Malignant neoplasm of pancreas, unspecified: Secondary | ICD-10-CM

## 2020-11-03 DIAGNOSIS — Z808 Family history of malignant neoplasm of other organs or systems: Secondary | ICD-10-CM | POA: Diagnosis not present

## 2020-11-03 DIAGNOSIS — F319 Bipolar disorder, unspecified: Secondary | ICD-10-CM | POA: Diagnosis not present

## 2020-11-03 DIAGNOSIS — Z87891 Personal history of nicotine dependence: Secondary | ICD-10-CM | POA: Diagnosis not present

## 2020-11-03 DIAGNOSIS — R5383 Other fatigue: Secondary | ICD-10-CM | POA: Diagnosis not present

## 2020-11-03 DIAGNOSIS — Z806 Family history of leukemia: Secondary | ICD-10-CM | POA: Diagnosis not present

## 2020-11-03 DIAGNOSIS — Z8042 Family history of malignant neoplasm of prostate: Secondary | ICD-10-CM | POA: Diagnosis not present

## 2020-11-03 MED ORDER — SODIUM CHLORIDE 0.9% FLUSH
10.0000 mL | INTRAVENOUS | Status: AC | PRN
  Administered 2020-11-03: 10 mL
  Filled 2020-11-03: qty 10

## 2020-11-03 MED ORDER — HEPARIN SOD (PORK) LOCK FLUSH 100 UNIT/ML IV SOLN
500.0000 [IU] | Freq: Once | INTRAVENOUS | Status: AC | PRN
  Administered 2020-11-03: 500 [IU]
  Filled 2020-11-03: qty 5

## 2020-11-03 MED ORDER — HEPARIN SOD (PORK) LOCK FLUSH 100 UNIT/ML IV SOLN
INTRAVENOUS | Status: AC
  Filled 2020-11-03: qty 5

## 2020-11-12 DIAGNOSIS — C259 Malignant neoplasm of pancreas, unspecified: Secondary | ICD-10-CM | POA: Diagnosis not present

## 2020-11-14 ENCOUNTER — Inpatient Hospital Stay (HOSPITAL_BASED_OUTPATIENT_CLINIC_OR_DEPARTMENT_OTHER): Payer: BC Managed Care – PPO | Admitting: Nurse Practitioner

## 2020-11-14 ENCOUNTER — Encounter: Payer: Self-pay | Admitting: Nurse Practitioner

## 2020-11-14 ENCOUNTER — Inpatient Hospital Stay: Payer: BC Managed Care – PPO

## 2020-11-14 ENCOUNTER — Other Ambulatory Visit: Payer: Self-pay

## 2020-11-14 ENCOUNTER — Other Ambulatory Visit: Payer: Self-pay | Admitting: *Deleted

## 2020-11-14 ENCOUNTER — Inpatient Hospital Stay: Payer: BC Managed Care – PPO | Attending: Oncology

## 2020-11-14 VITALS — BP 119/44 | HR 63 | Temp 97.7°F | Resp 16 | Ht 60.0 in | Wt 113.9 lb

## 2020-11-14 VITALS — BP 114/68 | HR 62 | Resp 18

## 2020-11-14 DIAGNOSIS — Z7952 Long term (current) use of systemic steroids: Secondary | ICD-10-CM | POA: Insufficient documentation

## 2020-11-14 DIAGNOSIS — R1013 Epigastric pain: Secondary | ICD-10-CM | POA: Insufficient documentation

## 2020-11-14 DIAGNOSIS — Z833 Family history of diabetes mellitus: Secondary | ICD-10-CM | POA: Insufficient documentation

## 2020-11-14 DIAGNOSIS — Z882 Allergy status to sulfonamides status: Secondary | ICD-10-CM | POA: Diagnosis not present

## 2020-11-14 DIAGNOSIS — G8929 Other chronic pain: Secondary | ICD-10-CM | POA: Diagnosis not present

## 2020-11-14 DIAGNOSIS — T451X5A Adverse effect of antineoplastic and immunosuppressive drugs, initial encounter: Secondary | ICD-10-CM | POA: Diagnosis not present

## 2020-11-14 DIAGNOSIS — Z888 Allergy status to other drugs, medicaments and biological substances status: Secondary | ICD-10-CM | POA: Diagnosis not present

## 2020-11-14 DIAGNOSIS — R5383 Other fatigue: Secondary | ICD-10-CM | POA: Insufficient documentation

## 2020-11-14 DIAGNOSIS — Z95828 Presence of other vascular implants and grafts: Secondary | ICD-10-CM

## 2020-11-14 DIAGNOSIS — K769 Liver disease, unspecified: Secondary | ICD-10-CM | POA: Insufficient documentation

## 2020-11-14 DIAGNOSIS — F319 Bipolar disorder, unspecified: Secondary | ICD-10-CM | POA: Diagnosis not present

## 2020-11-14 DIAGNOSIS — Z79899 Other long term (current) drug therapy: Secondary | ICD-10-CM | POA: Insufficient documentation

## 2020-11-14 DIAGNOSIS — Z5189 Encounter for other specified aftercare: Secondary | ICD-10-CM | POA: Diagnosis not present

## 2020-11-14 DIAGNOSIS — Z5111 Encounter for antineoplastic chemotherapy: Secondary | ICD-10-CM

## 2020-11-14 DIAGNOSIS — Z806 Family history of leukemia: Secondary | ICD-10-CM | POA: Insufficient documentation

## 2020-11-14 DIAGNOSIS — C253 Malignant neoplasm of pancreatic duct: Secondary | ICD-10-CM | POA: Insufficient documentation

## 2020-11-14 DIAGNOSIS — R1012 Left upper quadrant pain: Secondary | ICD-10-CM | POA: Insufficient documentation

## 2020-11-14 DIAGNOSIS — K580 Irritable bowel syndrome with diarrhea: Secondary | ICD-10-CM | POA: Insufficient documentation

## 2020-11-14 DIAGNOSIS — R220 Localized swelling, mass and lump, head: Secondary | ICD-10-CM | POA: Insufficient documentation

## 2020-11-14 DIAGNOSIS — C259 Malignant neoplasm of pancreas, unspecified: Secondary | ICD-10-CM

## 2020-11-14 DIAGNOSIS — Z9049 Acquired absence of other specified parts of digestive tract: Secondary | ICD-10-CM | POA: Diagnosis not present

## 2020-11-14 DIAGNOSIS — M545 Low back pain, unspecified: Secondary | ICD-10-CM | POA: Insufficient documentation

## 2020-11-14 DIAGNOSIS — Z823 Family history of stroke: Secondary | ICD-10-CM | POA: Insufficient documentation

## 2020-11-14 DIAGNOSIS — Z803 Family history of malignant neoplasm of breast: Secondary | ICD-10-CM | POA: Insufficient documentation

## 2020-11-14 DIAGNOSIS — E876 Hypokalemia: Secondary | ICD-10-CM | POA: Diagnosis not present

## 2020-11-14 DIAGNOSIS — Z811 Family history of alcohol abuse and dependence: Secondary | ICD-10-CM | POA: Insufficient documentation

## 2020-11-14 DIAGNOSIS — Z8249 Family history of ischemic heart disease and other diseases of the circulatory system: Secondary | ICD-10-CM | POA: Insufficient documentation

## 2020-11-14 DIAGNOSIS — Z87891 Personal history of nicotine dependence: Secondary | ICD-10-CM | POA: Diagnosis not present

## 2020-11-14 DIAGNOSIS — D6481 Anemia due to antineoplastic chemotherapy: Secondary | ICD-10-CM | POA: Diagnosis not present

## 2020-11-14 DIAGNOSIS — Z87440 Personal history of urinary (tract) infections: Secondary | ICD-10-CM | POA: Diagnosis not present

## 2020-11-14 DIAGNOSIS — Z885 Allergy status to narcotic agent status: Secondary | ICD-10-CM | POA: Diagnosis not present

## 2020-11-14 DIAGNOSIS — Z881 Allergy status to other antibiotic agents status: Secondary | ICD-10-CM | POA: Diagnosis not present

## 2020-11-14 DIAGNOSIS — Z808 Family history of malignant neoplasm of other organs or systems: Secondary | ICD-10-CM | POA: Insufficient documentation

## 2020-11-14 DIAGNOSIS — Z8042 Family history of malignant neoplasm of prostate: Secondary | ICD-10-CM | POA: Insufficient documentation

## 2020-11-14 DIAGNOSIS — Z818 Family history of other mental and behavioral disorders: Secondary | ICD-10-CM | POA: Insufficient documentation

## 2020-11-14 LAB — CBC WITH DIFFERENTIAL/PLATELET
Abs Immature Granulocytes: 0.02 10*3/uL (ref 0.00–0.07)
Basophils Absolute: 0 10*3/uL (ref 0.0–0.1)
Basophils Relative: 0 %
Eosinophils Absolute: 0.1 10*3/uL (ref 0.0–0.5)
Eosinophils Relative: 1 %
HCT: 30.6 % — ABNORMAL LOW (ref 36.0–46.0)
Hemoglobin: 10.2 g/dL — ABNORMAL LOW (ref 12.0–15.0)
Immature Granulocytes: 0 %
Lymphocytes Relative: 19 %
Lymphs Abs: 1 10*3/uL (ref 0.7–4.0)
MCH: 34 pg (ref 26.0–34.0)
MCHC: 33.3 g/dL (ref 30.0–36.0)
MCV: 102 fL — ABNORMAL HIGH (ref 80.0–100.0)
Monocytes Absolute: 0.6 10*3/uL (ref 0.1–1.0)
Monocytes Relative: 11 %
Neutro Abs: 3.6 10*3/uL (ref 1.7–7.7)
Neutrophils Relative %: 69 %
Platelets: 223 10*3/uL (ref 150–400)
RBC: 3 MIL/uL — ABNORMAL LOW (ref 3.87–5.11)
RDW: 15.9 % — ABNORMAL HIGH (ref 11.5–15.5)
WBC: 5.2 10*3/uL (ref 4.0–10.5)
nRBC: 0 % (ref 0.0–0.2)

## 2020-11-14 LAB — COMPREHENSIVE METABOLIC PANEL
ALT: 25 U/L (ref 0–44)
AST: 30 U/L (ref 15–41)
Albumin: 3.7 g/dL (ref 3.5–5.0)
Alkaline Phosphatase: 63 U/L (ref 38–126)
Anion gap: 4 — ABNORMAL LOW (ref 5–15)
BUN: 13 mg/dL (ref 6–20)
CO2: 27 mmol/L (ref 22–32)
Calcium: 8.9 mg/dL (ref 8.9–10.3)
Chloride: 107 mmol/L (ref 98–111)
Creatinine, Ser: 0.72 mg/dL (ref 0.44–1.00)
GFR, Estimated: >60 mL/min (ref >60)
Glucose, Bld: 103 mg/dL — ABNORMAL HIGH (ref 70–99)
Potassium: 4.1 mmol/L (ref 3.5–5.1)
Sodium: 138 mmol/L (ref 135–145)
Total Bilirubin: 0.2 mg/dL — ABNORMAL LOW (ref 0.3–1.2)
Total Protein: 7.1 g/dL (ref 6.5–8.1)

## 2020-11-14 MED ORDER — OXALIPLATIN CHEMO INJECTION 100 MG/20ML
65.0000 mg/m2 | Freq: Once | INTRAVENOUS | Status: AC
  Administered 2020-11-14: 100 mg via INTRAVENOUS
  Filled 2020-11-14: qty 20

## 2020-11-14 MED ORDER — DEXTROSE 5 % IV SOLN
Freq: Once | INTRAVENOUS | Status: AC
  Filled 2020-11-14: qty 250

## 2020-11-14 MED ORDER — SODIUM CHLORIDE 0.9% FLUSH
10.0000 mL | INTRAVENOUS | Status: DC | PRN
  Administered 2020-11-14 (×2): 10 mL
  Filled 2020-11-14: qty 10

## 2020-11-14 MED ORDER — POTASSIUM CHLORIDE CRYS ER 20 MEQ PO TBCR: 20.0000 meq | EXTENDED_RELEASE_TABLET | Freq: Every day | ORAL | 0 refills | Status: DC

## 2020-11-14 MED ORDER — FLUOROURACIL CHEMO INJECTION 5 GM/100ML
2400.0000 mg/m2 | INTRAVENOUS | Status: DC
  Administered 2020-11-14: 3600 mg via INTRAVENOUS
  Filled 2020-11-14: qty 72

## 2020-11-14 MED ORDER — SODIUM CHLORIDE 0.9% FLUSH
10.0000 mL | Freq: Once | INTRAVENOUS | Status: DC
  Filled 2020-11-14: qty 10

## 2020-11-14 MED ORDER — PALONOSETRON HCL INJECTION 0.25 MG/5ML
0.2500 mg | Freq: Once | INTRAVENOUS | Status: AC
  Administered 2020-11-14: 0.25 mg via INTRAVENOUS
  Filled 2020-11-14: qty 5

## 2020-11-14 MED ORDER — ATROPINE SULFATE 1 MG/ML IJ SOLN
0.5000 mg | Freq: Once | INTRAMUSCULAR | Status: AC | PRN
  Administered 2020-11-14: 0.5 mg via INTRAVENOUS
  Filled 2020-11-14: qty 1

## 2020-11-14 MED ORDER — SODIUM CHLORIDE 0.9 % IV SOLN
150.0000 mg | Freq: Once | INTRAVENOUS | Status: AC
  Administered 2020-11-14: 150 mg via INTRAVENOUS
  Filled 2020-11-14: qty 150

## 2020-11-14 MED ORDER — SODIUM CHLORIDE 0.9 % IV SOLN
600.0000 mg | Freq: Once | INTRAVENOUS | Status: AC
  Administered 2020-11-14: 600 mg via INTRAVENOUS
  Filled 2020-11-14: qty 30

## 2020-11-14 MED ORDER — SODIUM CHLORIDE 0.9 % IV SOLN
10.0000 mg | Freq: Once | INTRAVENOUS | Status: AC
  Administered 2020-11-14: 10 mg via INTRAVENOUS
  Filled 2020-11-14: qty 10

## 2020-11-14 MED ORDER — SODIUM CHLORIDE 0.9 % IV SOLN
150.0000 mg/m2 | Freq: Once | INTRAVENOUS | Status: AC
  Administered 2020-11-14: 220 mg via INTRAVENOUS
  Filled 2020-11-14: qty 10

## 2020-11-14 NOTE — Patient Instructions (Addendum)
[facility] ONCOLOGY   Discharge Instructions: Thank you for choosing [facility] to provide your oncology and hematology care.  If you have a lab appointment with the [facility], please go directly to the [facility] and check in at the registration area.  Wear comfortable clothing and clothing appropriate for easy access to any Portacath or PICC line.   We strive to give you quality time with your provider. You may need to reschedule your appointment if you arrive late (15 or more minutes).  Arriving late affects you and other patients whose appointments are after yours.  Also, if you miss three or more appointments without notifying the office, you may be dismissed from the clinic at the provider's discretion.      For prescription refill requests, have your pharmacy contact our office and allow 72 hours for refills to be completed.    Today you received the following chemotherapy and/or immunotherapy agents: Oxaliplatin, Irinotecan, Leucovorin, Fluorouracil.      To help prevent nausea and vomiting after your treatment, we encourage you to take your nausea medication as directed.  BELOW ARE SYMPTOMS THAT SHOULD BE REPORTED IMMEDIATELY: *FEVER GREATER THAN 100.4 F (38 C) OR HIGHER *CHILLS OR SWEATING *NAUSEA AND VOMITING THAT IS NOT CONTROLLED WITH YOUR NAUSEA MEDICATION *UNUSUAL SHORTNESS OF BREATH *UNUSUAL BRUISING OR BLEEDING *URINARY PROBLEMS (pain or burning when urinating, or frequent urination) *BOWEL PROBLEMS (unusual diarrhea, constipation, pain near the anus) TENDERNESS IN MOUTH AND THROAT WITH OR WITHOUT PRESENCE OF ULCERS (sore throat, sores in mouth, or a toothache) UNUSUAL RASH, SWELLING OR PAIN  UNUSUAL VAGINAL DISCHARGE OR ITCHING   Items with * indicate a potential emergency and should be followed up as soon as possible or go to the Emergency Department if any problems should occur.  Please show the CHEMOTHERAPY ALERT  CARD or IMMUNOTHERAPY ALERT CARD at check-in to the Emergency Department and triage nurse.  Should you have questions after your visit or need to cancel or reschedule your appointment, please contact [facility]  [phone] and follow the prompts.  Office hours are 8:00 a.m. to 4:30 p.m. Monday - Friday. Please note that voicemails left after 4:00 p.m. may not be returned until the following business day.  We are closed weekends and major holidays. You have access to a nurse at all times for urgent questions. Please call the main number to the clinic [phone] and follow the prompts.  For any non-urgent questions, you may also contact your provider using MyChart. We now offer e-Visits for anyone 1 and older to request care online for non-urgent symptoms. For details visit [email or web address].   Also download the MyChart app! Go to the app store, search "MyChart", open the app, select [facility], and log in with your MyChart username and password.  Due to Covid, a mask is required upon entering the hospital/clinic. If you do not have a mask, one will be given to you upon arrival. For doctor visits, patients may have 1 support person aged 40 or older with them. For treatment visits, patients cannot have anyone with them due to current Covid guidelines and our immunocompromised population.   The chemotherapy medication bag should finish at 72 hours. For example, if your pump is scheduled for 46 hours and it was put on at 4:00 p.m., it should finish at 2:00 p.m. the day it is scheduled to come off regardless of your appointment time.     Estimated  time to finish on 11/17/2020 at 3:20 pm.  If the pump alarms go off prior to the pump reading "Low Volume" then call [phone] and someone can assist you.  If the plunger comes out and the chemotherapy medication is leaking out, please use your home chemo spill kit to clean up the spill. Do NOT use paper  towels or other household products.  If you have problems or questions regarding your pump, please call either [phone] (24 hours a day) or the cancer center Monday-Friday 8:00 a.m.- 4:30 p.m. at the clinic number and we will assist you. If you are unable to get assistance, then go to the nearest Emergency Department and ask the staff to contact the IV team for assistance.

## 2020-11-14 NOTE — Progress Notes (Signed)
Pt quickly gets cold things and puts them down quickly but does not have neuropathy. She does say that the cold sensitivity goes away after few days

## 2020-11-14 NOTE — Progress Notes (Signed)
Nutrition Follow-up:  Patient with stage I pancreatic cancer.  Patient receiving folfirinox.  Met with patient during infusion.  Patient reports that her appetite has been about the same.  Still eating basically same foods. Drinking 2 boost shakes per day.  Eating lava cakes. Reports that her husband is encouraging her to eat.     Medications: reviewed  Labs: reviewed  Anthropometrics:   Weight 113 lb 14.4 oz today, stable  133 lb 12.8 oz on 9/23 111 lb 9.6 oz on 9/9 113 lb 4.8 oz on 8/19 UBW of 120-125 lb   NUTRITION DIAGNOSIS: Unintentional weight loss stable    INTERVENTION:  Encouraged boost shakes 360 calorie at least BID Continue high calorie, high protein foods    MONITORING, EVALUATION, GOAL: weight trends, intake   NEXT VISIT: Friday, Oct 21 during infusion  Sheryl Perez, [place], [place] Registered Dietitian [phone] (mobile)

## 2020-11-14 NOTE — Progress Notes (Signed)
Hematology/Oncology Consult Note [facility]  Telephone:([phone] [phone]  Patient Care Team: Cletis Athens, MD as PCP - General (Internal Medicine) Grant Fontana, [place] as Referring Physician (Chiropractic Medicine) Christene Lye, MD (General Surgery) Sindy Guadeloupe, MD as Consulting Physician (Hematology and Oncology)   Name of the patient: Sheryl Perez  [id number]  1960/10/10   Date of visit: 11/14/20  Diagnosis- stage I pancreatic cancer  Chief complaint/ Reason for visit-on treatment assessment prior to cycle 5 of neoadjuvant modified FOLFIRINOX chemotherapy  Heme/Onc history: patient is a 60 year old female with a history of irritable bowel disease for which she follows up with [facility].  She has undergone EGD and colonoscopy with them in the past.  More recently patient was complaining of left upper quadrant and epigastric abdominal pain which causes sudden episodes of abdominal spasms and was prescribed as needed tramadol for it.  These episodes of pain are unrelated to food and are dull aching or throbbing.  She has undergone cholecystectomy in the past.  She underwent CT abdomen and pelvis with contrast in the ER on 08/04/2020 Which did not reveal any acute pathology which showed a possible hypoechoic 1.9 cm right hepatic lobe lesion.  This was followed by an MRI which showed no discrete lesion in the liver and the area of concern noted on CT scan was more likely to represent variable fat deposition.  However there was loss of normal T1 signal in the peripheral pancreas and a potential small pancreatic lesion suspicious for early pancreatic adenocarcinoma in the neck of the pancreas.     Patient underwent EUS by Dr. Mont Dutton which showed an irregular mass in the pancreatic neck measuring 15 x 11 mm.  FNA showed adenocarcinoma.  Pancreatic duct measured 1.1 mm in the head with abrupt dilatation in the region of the mass to 4.1 mm.  No  abnormality noted in the common bile duct and hepatic duct.  Celiac region showed no significant endosonographic abnormality.  No lymphadenopathy was seen.  CA 19-9 mildly elevated at 35   Patient was seen by Dr. Hyman Hopes from pancreaticobiliary surgery at [facility] for surgical opinion.  Although patient has a small lesion involving the pancreatic neck, there was a concern for tumor extension towards the splenic vein and narrowing of portal splenic confluence.  Neoadjuvant chemotherapy for 3 months was therefore favored    Interval history- Patient is 61 year old female who returns to clinic for labs, re-evaluation, and consideration of cycle 5 of neoadjuvant modified FOLFIRINOX chemotherapy with Udenyca support. Weight is stable.   ECOG PS- 1 Pain scale- 0  Review of systems- Review of Systems  Constitutional:  Positive for malaise/fatigue. Negative for chills, fever and weight loss.  HENT:  Negative for congestion, ear discharge and nosebleeds.   Eyes:  Negative for blurred vision.  Respiratory:  Negative for cough, hemoptysis, sputum production, shortness of breath and wheezing.   Cardiovascular:  Negative for chest pain, palpitations, orthopnea and claudication.  Gastrointestinal:  Negative for abdominal pain, blood in stool, constipation, diarrhea, heartburn, melena, nausea and vomiting.  Genitourinary:  Negative for dysuria, flank pain, frequency, hematuria and urgency.  Musculoskeletal:  Negative for back pain, joint pain and myalgias.  Skin:  Negative for rash.  Neurological:  Negative for dizziness, tingling, focal weakness, seizures, weakness and headaches.  Endo/Heme/Allergies:  Does not bruise/bleed easily.  Psychiatric/Behavioral:  Negative for depression and suicidal ideas. The patient does not have insomnia.      Allergies  Allergen Reactions   Sulfacetamide Sodium Swelling    Diarrhea,dizziness, tongue swelling   Other Other (See Comments) and Swelling    PT states that  anything with the sides effects "may cause rash" she cannot take due to her psoriasis, it will make the patches on her hand so thick she can't use her hands.  Diarrhea,dizziness, tongue swelling   Oxycontin [Oxycodone Hcl] Nausea And Vomiting   Sulfa Antibiotics Swelling    Diarrhea,dizziness, tongue swelling   Abilify [Aripiprazole] Other (See Comments)   Cephalosporins Rash   Erythromycin Other (See Comments)    Abd. pain Abd. pain Other reaction(s): Other (See Comments) Abd. 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    Bipolar disorder (HCC)    Chest pain, unspecified    Chronic low back pain 10/23/2013   Chronic UTI    COPD (chronic obstructive pulmonary disease) (HCC)    Depression    Dysfunction of eustachian tube    Enlargement of lymph nodes    Family history of brain cancer    Family history of breast cancer    Family history of prostate cancer    History of Bell's palsy Right   Injury, other and unspecified, knee, leg, ankle, and foot    MRSA (methicillin resistant staph aureus) culture positive 07/11/2014   abscess   Other specified disease of hair and hair follicles    Pancreatic cancer ([place])    Pancreatic cancer ([place])    Rectal prolapse    Unspecified symptom associated with female genital organs      Past Surgical History:  Procedure Laterality Date   ABDOMINAL HYSTERECTOMY     bladder tack  2010   CHOLECYSTECTOMY  2002   FOOT SURGERY Bilateral 2004   bunion   GANGLION CYST EXCISION  2002   INCISION AND DRAINAGE ABSCESS  04-16-15   PARTIAL HYSTERECTOMY  2010   uterus removed   PORTA CATH INSERTION N/A 09/11/2020   Procedure: PORTA CATH INSERTION;  Surgeon: Algernon Huxley, MD;  Location: [facility];  Service: Cardiovascular;   Laterality: N/A;    Social History   Socioeconomic History   Marital status: Married    Spouse name: Not on file   Number of children: 3   Years of education: hs   Highest education level: Some college, no degree  Occupational History   Not on file  Tobacco Use   Smoking status: Former    Packs/day: 0.50    Years: 40.00    Pack years: 20.00    Types: Cigarettes    Quit date: 07/14/2020    Years since quitting: 0.3   Smokeless tobacco: Never  Vaping Use   Vaping Use: Never used  Substance and Sexual Activity   Alcohol use: No    Alcohol/week: 1.0 standard drink    Types: 1 Glasses of wine per week    Comment: occasional   Drug use: Yes    Types: Marijuana    Comment: last used 05-20-15   Sexual activity: Yes    Birth control/protection: None  Other Topics Concern   Not on file  Social History Narrative   Patient lives at home with her husband [name]).   Patient works full time.  Education CNA    Right handed.   Caffeine Tea , Soda.   Social Determinants of Health   Financial Resource Strain: Not on file  Food Insecurity: Not on file  Transportation Needs: Not on file  Physical Activity: Not on file  Stress: Not on file  Social Connections: Not on file  Intimate Partner Violence: Not on file    Family History  Problem Relation Age of Onset   Diabetes Father    Cancer Father        brain tumor   Depression Father    Depression Sister    Alcohol abuse Brother    Depression Brother    Leukemia Maternal Aunt    Prostate cancer Maternal Uncle    Breast cancer Paternal Aunt        dx >50   Heart disease Maternal Grandmother    Heart attack Maternal Grandmother    Stroke Maternal Grandmother    Parkinson's disease Maternal Grandfather    Leukemia Paternal Grandmother    Diabetes Paternal Grandfather    Breast cancer Cousin    Brain cancer Niece        dx under 73     Current Outpatient Medications:    albuterol (VENTOLIN HFA) 108 (90 Base) MCG/ACT  inhaler, Inhale 2 puffs into the lungs every 4 (four) hours as needed for wheezing or shortness of breath., Disp: 8 g, Rfl: 4   ALPRAZolam (XANAX) 0.5 MG tablet, Take 1 tablet (0.5 mg total) by mouth 2 (two) times daily as needed for anxiety., Disp: 20 tablet, Rfl: 1   Ascorbic Acid (VITAMIN C) 1000 MG tablet, Take 1,000 mg by mouth daily., Disp: , Rfl:    divalproex (DEPAKOTE) 250 MG DR tablet, Take 1 tablet (250 mg total) by mouth 2 (two) times daily., Disp: 180 tablet, Rfl: 1   famotidine (PEPCID) 20 MG tablet, Take 20 mg by mouth daily., Disp: , Rfl:    ferrous sulfate 325 (65 FE) MG tablet, Take 325 mg by mouth 2 (two) times daily with a meal., Disp: , Rfl:    fluticasone (FLONASE) 50 MCG/ACT nasal spray, Place 1 spray into both nostrils daily. (Patient taking differently: Place 1 spray into both nostrils daily as needed.), Disp: 11.1 mL, Rfl: 2   lidocaine-prilocaine (EMLA) cream, Apply to affected area once, Disp: 30 g, Rfl: 3   loperamide (IMODIUM A-D) 2 MG tablet, Take 2 at onset of diarrhea, then 1 every 2hrs until 12hr without a BM. May take 2 tab every 4hrs at bedtime. If diarrhea recurs repeat., Disp: 100 tablet, Rfl: 1   loratadine (CLARITIN) 10 MG tablet, TAKE 1 TABLET BY MOUTH EVERY DAY, Disp: 90 tablet, Rfl: 1   Multiple Vitamin (MULTIVITAMIN) tablet, Take 1 tablet by mouth daily., Disp: , Rfl:    naproxen sodium (ALEVE) 220 MG tablet, Take 220 mg by mouth daily as needed., Disp: , Rfl:    ondansetron (ZOFRAN) 8 MG tablet, Take 1 tablet (8 mg total) by mouth 2 (two) times daily as needed. Start on day 3 after chemotherapy., Disp: 30 tablet, Rfl: 1   potassium chloride SA (KLOR-CON) 20 MEQ tablet, Take 1 tablet (20 mEq total) by mouth daily., Disp: 21 tablet, Rfl: 0   prochlorperazine (COMPAZINE) 10 MG tablet, Take 1 tablet (10 mg total) by mouth every 6 (six) hours as needed (Nausea or vomiting)., Disp: 30 tablet, Rfl: 1   propranolol (INDERAL) 20 MG tablet, Take 1 tablet (20 mg  total) by mouth 3 (  three) times daily., Disp: 270 tablet, Rfl: 1   cyclobenzaprine (FLEXERIL) 5 MG tablet, Take 5 mg by mouth 2 (two) times daily at 10 AM and 5 PM.  (Patient not taking: No sig reported), Disp: , Rfl:    dexamethasone (DECADRON) 4 MG tablet, Take 2 tablets (8 mg total) by mouth daily. Start the day after chemotherapy for 3 days. Take with food. (Patient not taking: Reported on 11/14/2020), Disp: 8 tablet, Rfl: 5   QUEtiapine (SEROQUEL) 50 MG tablet, Take 1 tablet (50 mg total) by mouth at bedtime as needed. (Patient not taking: No sig reported), Disp: 90 tablet, Rfl: 0   triamcinolone cream (KENALOG) 0.1 %, Apply 1 application topically 2 (two) times daily. (Patient not taking: No sig reported), Disp: 30 g, Rfl: 0 No current facility-administered medications for this visit.  Facility-Administered Medications Ordered in Other Visits:    heparin lock flush 100 UNIT/ML injection, , , ,    sodium chloride flush (NS) 0.9 % injection 10 mL, 10 mL, Intracatheter, PRN, Sindy Guadeloupe, MD, 10 mL at 11/03/20 1441  Physical exam:  Vitals:   11/14/20 0923  BP: (!) 119/44  Pulse: 63  Resp: 16  Temp: 97.7 F (36.5 C)  TempSrc: Oral  Weight: 113 lb 14.4 oz (51.7 kg)  Height: 5' (1.524 m)   Physical Exam Constitutional:      General: She is not in acute distress. Cardiovascular:     Rate and Rhythm: Normal rate and regular rhythm.     Heart sounds: Normal heart sounds.  Pulmonary:     Effort: Pulmonary effort is normal.     Breath sounds: Normal breath sounds.  Abdominal:     General: Bowel sounds are normal.     Palpations: Abdomen is soft.  Skin:    General: Skin is warm and dry.  Neurological:     Mental Status: She is alert and oriented to person, place, and time.     CMP Latest Ref Rng & Units 11/14/2020  Glucose 70 - 99 mg/dL 103(H)  BUN 6 - 20 mg/dL 13  Creatinine 0.44 - 1.00 mg/dL 0.72  Sodium 135 - 145 mmol/L 138  Potassium 3.5 - 5.1 mmol/L 4.1  Chloride 98 -  111 mmol/L 107  CO2 22 - 32 mmol/L 27  Calcium 8.9 - 10.3 mg/dL 8.9  Total Protein 6.5 - 8.1 g/dL 7.1  Total Bilirubin 0.3 - 1.2 mg/dL 0.2(L)  Alkaline Phos 38 - 126 U/L 63  AST 15 - 41 U/L 30  ALT 0 - 44 U/L 25   CBC Latest Ref Rng & Units 11/14/2020  WBC 4.0 - 10.5 K/uL 5.2  Hemoglobin 12.0 - 15.0 g/dL 10.2(L)  Hematocrit 36.0 - 46.0 % 30.6(L)  Platelets 150 - 400 K/uL 223    Assessment and plan- Patient is a 60 y.o. female with stage I pancreatic neck adenocarcinoma cT1 cN0 M0, borderline resectable, who returns to clinic for labs, further evaluation, and consideration for cycle 5 of neoadjuvant modified FOLFIRINOX chemotherapy with Udenyca support.  Ellen Henri was held with cycle 4 due to good response from cycle 3 Udenyca.  WBC today is 5.2.  No neutropenia. Plan for Udenyca with cycle 5.  Hemoglobin is stable at 10.2.  Platelets normal.  Counts acceptable to proceed with cycle 5 today.  Return to clinic in 2 weeks for labs, follow-up with Dr. Janese Banks, and consideration of cycle 6 of FOLFIRINOX chemotherapy +/- Udenyca.   Plan to follow-up with Dr. Hyman Hopes from  [facility] regarding scheduling a repeat CT scan and follow-up at [facility] after 3 months of chemotherapy to see if she can proceed with surgery or if we need to finish up 3 more months of neoadjuvant chemotherapy.  Chemo induced anemia: Continue to monitor.  She recently had ferritin and iron studies B12 and folate levels done which were all normal.  Hypokalemia- refill potassium today  Visit Diagnosis 1. Encounter for antineoplastic chemotherapy    Beckey Rutter, [place], AGNP-C [place] at [facility] [phone] (clinic) 11/14/2020

## 2020-11-17 ENCOUNTER — Other Ambulatory Visit: Payer: Self-pay

## 2020-11-17 ENCOUNTER — Inpatient Hospital Stay: Payer: BC Managed Care – PPO

## 2020-11-17 VITALS — BP 127/64 | HR 81 | Temp 96.0°F | Resp 19

## 2020-11-17 DIAGNOSIS — C253 Malignant neoplasm of pancreatic duct: Secondary | ICD-10-CM | POA: Diagnosis not present

## 2020-11-17 DIAGNOSIS — R1013 Epigastric pain: Secondary | ICD-10-CM | POA: Diagnosis not present

## 2020-11-17 DIAGNOSIS — R5383 Other fatigue: Secondary | ICD-10-CM | POA: Diagnosis not present

## 2020-11-17 DIAGNOSIS — Z5111 Encounter for antineoplastic chemotherapy: Secondary | ICD-10-CM | POA: Diagnosis not present

## 2020-11-17 DIAGNOSIS — G8929 Other chronic pain: Secondary | ICD-10-CM | POA: Diagnosis not present

## 2020-11-17 DIAGNOSIS — Z7952 Long term (current) use of systemic steroids: Secondary | ICD-10-CM | POA: Diagnosis not present

## 2020-11-17 DIAGNOSIS — R220 Localized swelling, mass and lump, head: Secondary | ICD-10-CM | POA: Diagnosis not present

## 2020-11-17 DIAGNOSIS — K769 Liver disease, unspecified: Secondary | ICD-10-CM | POA: Diagnosis not present

## 2020-11-17 DIAGNOSIS — K580 Irritable bowel syndrome with diarrhea: Secondary | ICD-10-CM | POA: Diagnosis not present

## 2020-11-17 DIAGNOSIS — M545 Low back pain, unspecified: Secondary | ICD-10-CM | POA: Diagnosis not present

## 2020-11-17 DIAGNOSIS — E876 Hypokalemia: Secondary | ICD-10-CM | POA: Diagnosis not present

## 2020-11-17 DIAGNOSIS — R1012 Left upper quadrant pain: Secondary | ICD-10-CM | POA: Diagnosis not present

## 2020-11-17 DIAGNOSIS — F319 Bipolar disorder, unspecified: Secondary | ICD-10-CM | POA: Diagnosis not present

## 2020-11-17 DIAGNOSIS — C259 Malignant neoplasm of pancreas, unspecified: Secondary | ICD-10-CM

## 2020-11-17 DIAGNOSIS — T451X5A Adverse effect of antineoplastic and immunosuppressive drugs, initial encounter: Secondary | ICD-10-CM | POA: Diagnosis not present

## 2020-11-17 DIAGNOSIS — Z5189 Encounter for other specified aftercare: Secondary | ICD-10-CM | POA: Diagnosis not present

## 2020-11-17 DIAGNOSIS — D6481 Anemia due to antineoplastic chemotherapy: Secondary | ICD-10-CM | POA: Diagnosis not present

## 2020-11-17 MED ORDER — PEGFILGRASTIM-CBQV 6 MG/0.6ML ~~LOC~~ SOSY
6.0000 mg | PREFILLED_SYRINGE | Freq: Once | SUBCUTANEOUS | Status: AC
  Administered 2020-11-17: 6 mg via SUBCUTANEOUS
  Filled 2020-11-17: qty 0.6

## 2020-11-17 MED ORDER — SODIUM CHLORIDE 0.9% FLUSH
10.0000 mL | INTRAVENOUS | Status: DC | PRN
Start: 2020-11-17 — End: 2020-11-17
  Administered 2020-11-17: 10 mL
  Filled 2020-11-17: qty 10

## 2020-11-17 MED ORDER — HEPARIN SOD (PORK) LOCK FLUSH 100 UNIT/ML IV SOLN
500.0000 [IU] | Freq: Once | INTRAVENOUS | Status: AC | PRN
  Administered 2020-11-17: 500 [IU]
  Filled 2020-11-17: qty 5

## 2020-11-17 NOTE — Patient Instructions (Signed)
C[facility]ER CENTER [place] [facility]  Discharge Instructions: Thank you for choosing [facility] to provide your oncology and hematology care.  If you have a lab appointment with the Cancer Center, please go directly to the Cancer Center and check in at the registration area.  Wear comfortable clothing and clothing appropriate for easy access to any Portacath or PICC line.   We strive to give you quality time with your provider. You may need to reschedule your appointment if you arrive late (15 or more minutes).  Arriving late affects you and other patients whose appointments are after yours.  Also, if you miss three or more appointments without notifying the office, you may be dismissed from the clinic at the provider's discretion.      For prescription refill requests, have your pharmacy contact our office and allow 72 hours for refills to be completed.      To help prevent nausea and vomiting after your treatment, we encourage you to take your nausea medication as directed.  BELOW ARE SYMPTOMS THAT SHOULD BE REPORTED IMMEDIATELY: *FEVER GREATER THAN 100.4 F (38 C) OR HIGHER *CHILLS OR SWEATING *NAUSEA AND VOMITING THAT IS NOT CONTROLLED WITH YOUR NAUSEA MEDICATION *UNUSUAL SHORTNESS OF BREATH *UNUSUAL BRUISING OR BLEEDING *URINARY PROBLEMS (pain or burning when urinating, or frequent urination) *BOWEL PROBLEMS (unusual diarrhea, constipation, pain near the anus) TENDERNESS IN MOUTH AND THROAT WITH OR WITHOUT PRESENCE OF ULCERS (sore throat, sores in mouth, or a toothache) UNUSUAL RASH, SWELLING OR PAIN  UNUSUAL VAGINAL DISCHARGE OR ITCHING   Items with * indicate a potential emergency and should be followed up as soon as possible or go to the Emergency Department if any problems should occur.  Please show the CHEMOTHERAPY ALERT CARD or IMMUNOTHERAPY ALERT CARD at check-in to the Emergency Department and triage nurse.  Should you have questions after your  visit or need to cancel or reschedule your appointment, please contact CANCER CENTER [place] [facility]  [phone] and follow the prompts.  Office hours are 8:00 a.m. to 4:30 p.m. Monday - Friday. Please note that voicemails left after 4:00 p.m. may not be returned until the following business day.  We are closed weekends and major holidays. You have access to a nurse at all times for urgent questions. Please call the main number to the clinic [phone] and follow the prompts.  For any non-urgent questions, you may also contact your provider using MyChart. We now offer e-Visits for anyone 18 and older to request care online for non-urgent symptoms. For details visit [email or web address].   Also download the MyChart app! Go to the app store, search "MyChart", open the app, select [facility], and log in with your MyChart username and password.  Due to Covid, a mask is required upon entering the hospital/clinic. If you do not have a mask, one will be given to you upon arrival. For doctor visits, patients may have 1 support person aged 18 or older with them. For treatment visits, patients cannot have anyone with them due to current Covid guidelines and our immunocompromised population.  

## 2020-11-19 ENCOUNTER — Other Ambulatory Visit
Admission: RE | Admit: 2020-11-19 | Discharge: 2020-11-19 | Disposition: A | Discharge status: Home / Self Care | Payer: BC Managed Care – PPO | Attending: Psychiatry | Admitting: Psychiatry

## 2020-11-19 DIAGNOSIS — F3178 Bipolar disorder, in full remission, most recent episode mixed: Secondary | ICD-10-CM | POA: Diagnosis not present

## 2020-11-19 DIAGNOSIS — F411 Generalized anxiety disorder: Secondary | ICD-10-CM | POA: Diagnosis not present

## 2020-11-19 DIAGNOSIS — Z5181 Encounter for therapeutic drug level monitoring: Secondary | ICD-10-CM | POA: Diagnosis not present

## 2020-11-19 DIAGNOSIS — Z79899 Other long term (current) drug therapy: Secondary | ICD-10-CM | POA: Insufficient documentation

## 2020-11-19 LAB — VALPROIC ACID LEVEL: Valproic Acid Lvl: 25 ug/mL — ABNORMAL LOW (ref 50.0–100.0)

## 2020-11-20 ENCOUNTER — Other Ambulatory Visit: Payer: Self-pay

## 2020-11-20 DIAGNOSIS — M654 Radial styloid tenosynovitis [de Quervain]: Secondary | ICD-10-CM

## 2020-11-25 ENCOUNTER — Other Ambulatory Visit: Payer: Self-pay

## 2020-11-25 ENCOUNTER — Ambulatory Visit (INDEPENDENT_AMBULATORY_CARE_PROVIDER_SITE_OTHER): Payer: BC Managed Care – PPO | Admitting: Psychiatry

## 2020-11-25 ENCOUNTER — Encounter: Payer: Self-pay | Admitting: Psychiatry

## 2020-11-25 VITALS — BP 128/72 | HR 88 | Temp 98.3°F | Wt 115.8 lb

## 2020-11-25 DIAGNOSIS — Z79899 Other long term (current) drug therapy: Secondary | ICD-10-CM

## 2020-11-25 DIAGNOSIS — F3178 Bipolar disorder, in full remission, most recent episode mixed: Secondary | ICD-10-CM

## 2020-11-25 DIAGNOSIS — F4323 Adjustment disorder with mixed anxiety and depressed mood: Secondary | ICD-10-CM | POA: Diagnosis not present

## 2020-11-25 DIAGNOSIS — F17200 Nicotine dependence, unspecified, uncomplicated: Secondary | ICD-10-CM | POA: Diagnosis not present

## 2020-11-25 DIAGNOSIS — F411 Generalized anxiety disorder: Secondary | ICD-10-CM

## 2020-11-25 DIAGNOSIS — F1221 Cannabis dependence, in remission: Secondary | ICD-10-CM

## 2020-11-25 DIAGNOSIS — F603 Borderline personality disorder: Secondary | ICD-10-CM

## 2020-11-25 NOTE — Progress Notes (Signed)
Sheryl Perez OP Progress Note  11/25/2020 3:00 PM Sheryl Perez  MRN:  [id number]  Chief Complaint:  Chief Complaint   Follow-up; Depression; Anxiety    HPI: Sheryl Perez is a 60 year old Caucasian female who has a history of bipolar disorder, borderline personality disorder, cannabis use disorder in remission, generalized anxiety disorder, recent diagnosis of pancreatic adenocarcinoma was evaluated in office today.  Patient with stage I pancreatic cancer neck adenocarcinoma cT1 cN0 M0, borderline resectable, currently undergoing chemotherapy, reviewed notes per NP Ms.Allen dated 11/14/2020.  Patient reports overall she is coping well.  She does have nausea, abdominal pain and diarrhea however she is eating better and her weight is getting better.  She is excited about that.  She continues to work although she continues to have FMLA and is able to take time off for treatment.  She does have good support system from her family.  She is compliant on the Depakote.  Reviewed and discussed most recent Depakote level with patient-subtherapeutic.  She is not interested in dosage increase, mood symptoms are stable.  She reports sleep is overall okay, does have Seroquel available however she does not use it much.  Patient denies any suicidality, homicidality or perceptual disturbances.  Patient denies any other concerns today.  Visit Diagnosis:    ICD-10-CM   1. Bipolar disorder, in full remission, most recent episode mixed ([place])  F31.78     2. GAD (generalized anxiety disorder)  F41.1     3. Adjustment disorder with mixed anxiety and depressed mood  F43.23     4. Tobacco use disorder  F17.200    in remission    5. Borderline personality disorder ([place])  F60.3     6. Cannabis use disorder, moderate, in early remission ([place])  F12.21     7. High risk medication use  Z79.899       Past Psychiatric History: Reviewed past psychiatric history from progress note on 04/12/2017.  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    Bipolar disorder (HCC)    Chest pain, unspecified    Chronic low back pain 10/23/2013   Chronic UTI    COPD (chronic obstructive pulmonary disease) (HCC)    Depression    Dysfunction of eustachian tube    Enlargement of lymph nodes    Family history of brain cancer    Family history of breast cancer    Family history of prostate cancer    History of Bell's palsy Right   Injury, other and unspecified, knee, leg, ankle, and foot    MRSA (methicillin resistant staph aureus) culture positive 07/11/2014   abscess   Other specified disease of hair and hair follicles    Pancreatic cancer ([place])    Pancreatic cancer ([place])    Rectal prolapse    Unspecified symptom associated with female genital organs     Past Surgical History:  Procedure Laterality Date   ABDOMINAL HYSTERECTOMY     bladder tack  2010   CHOLECYSTECTOMY  2002   FOOT SURGERY Bilateral 2004   bunion   GANGLION CYST EXCISION  2002   INCISION AND DRAINAGE ABSCESS  04-16-15   PARTIAL HYSTERECTOMY  2010   uterus removed   PORTA CATH INSERTION N/A 09/11/2020   Procedure: PORTA CATH INSERTION;  Surgeon: Algernon Huxley, Perez;  Location:  [facility];  Service: Cardiovascular;  Laterality: N/A;    Family Psychiatric History: Reviewed family psychiatric history from progress note on 04/12/2017  Family History:  Family History  Problem Relation Age of Onset   Diabetes Father    Cancer Father        brain tumor   Depression Father    Depression Sister    Alcohol abuse Brother    Depression Brother    Leukemia Maternal Aunt    Prostate cancer Maternal Uncle    Breast cancer Paternal Aunt        dx >50   Heart disease Maternal Grandmother    Heart attack Maternal Grandmother    Stroke Maternal Grandmother    Parkinson's  disease Maternal Grandfather    Leukemia Paternal Grandmother    Diabetes Paternal Grandfather    Breast cancer Cousin    Brain cancer Niece        dx under 80    Social History: Reviewed social history from progress note on 04/12/2017 Social History   Socioeconomic History   Marital status: Married    Spouse name: Not on file   Number of children: 3   Years of education: hs   Highest education level: Some college, no degree  Occupational History   Not on file  Tobacco Use   Smoking status: Former    Packs/day: 0.50    Years: 40.00    Pack years: 20.00    Types: Cigarettes    Quit date: 07/14/2020    Years since quitting: 0.3   Smokeless tobacco: Never  Vaping Use   Vaping Use: Never used  Substance and Sexual Activity   Alcohol use: No    Alcohol/week: 1.0 standard drink    Types: 1 Glasses of wine per week    Comment: occasional   Drug use: Yes    Types: Marijuana    Comment: last used 05-20-15   Sexual activity: Yes    Birth control/protection: None  Other Topics Concern   Not on file  Social History Narrative   Patient lives at home with her husband Sheryl Perez).   Patient works full time.   Education CNA    Right handed.   Caffeine Tea , Soda.   Social Determinants of Health   Financial Resource Strain: Not on file  Food Insecurity: Not on file  Transportation Needs: Not on file  Physical Activity: Not on file  Stress: Not on file  Social Connections: Not on file    Allergies:  Allergies  Allergen Reactions   Sulfacetamide Sodium Swelling    Diarrhea,dizziness, tongue swelling   Other Other (See Comments) and Swelling    PT states that anything with the sides effects "may cause rash" she cannot take due to her psoriasis, it will make the patches on her hand so thick she can't use her hands.  Diarrhea,dizziness, tongue swelling   Oxycontin [Oxycodone Hcl] Nausea And Vomiting   Sulfa Antibiotics Swelling    Diarrhea,dizziness, tongue swelling   Abilify  [Aripiprazole] Other (See Comments)   Cephalosporins Rash   Erythromycin Other (See Comments)    Abd. pain Abd. pain Other reaction(s): Other (See Comments) Abd. pain   Lamotrigine Rash and Other (See Comments)    psorasis worsen psorasis worsen   Risperidone Other (See Comments)    tremors tremors tremors    Metabolic Disorder Labs: Lab Results  Component Value Date   HGBA1C 5.3 08/24/2018   MPG 105.41 08/24/2018   MPG  105.41 04/14/2017   Lab Results  Component Value Date   PROLACTIN 9.3 08/24/2018   PROLACTIN 12.5 04/14/2017   Lab Results  Component Value Date   CHOL 197 12/12/2019   TRIG 68 08/04/2020   HDL 51 12/12/2019   CHOLHDL 3.9 12/12/2019   VLDL 37 08/24/2018   LDLCALC 123 (H) 12/12/2019   LDLCALC 107 (H) 08/24/2018   Lab Results  Component Value Date   TSH 1.27 12/12/2019   TSH 2.376 04/14/2017    Therapeutic Level Labs: Lab Results  Component Value Date   LITHIUM 0.34 (L) 04/14/2017   LITHIUM 0.3 (L) 06/15/2016   Lab Results  Component Value Date   VALPROATE 25 (L) 11/19/2020   VALPROATE 17 (L) 02/22/2020   No components found for:  CBMZ  Current Medications: Current Outpatient Medications  Medication Sig Dispense Refill   Ascorbic Acid (VITAMIN C) 1000 MG tablet Take 1,000 mg by mouth daily.     dexamethasone (DECADRON) 4 MG tablet Take 2 tablets (8 mg total) by mouth daily. Start the day after chemotherapy for 3 days. Take with food. 8 tablet 5   divalproex (DEPAKOTE) 250 MG DR tablet Take 1 tablet (250 mg total) by mouth 2 (two) times daily. 180 tablet 1   famotidine (PEPCID) 20 MG tablet Take 20 mg by mouth daily.     ferrous sulfate 325 (65 FE) MG tablet Take 325 mg by mouth 2 (two) times daily with a meal.     fluticasone (FLONASE) 50 MCG/ACT nasal spray Place 1 spray into both nostrils daily. (Patient taking differently: Place 1 spray into both nostrils daily as needed.) 11.1 mL 2   loratadine (CLARITIN) 10 MG tablet TAKE 1 TABLET  BY MOUTH EVERY DAY 90 tablet 1   Multiple Vitamin (MULTIVITAMIN) tablet Take 1 tablet by mouth daily.     ondansetron (ZOFRAN) 8 MG tablet Take 1 tablet (8 mg total) by mouth 2 (two) times daily as needed. Start on day 3 after chemotherapy. 30 tablet 1   potassium chloride SA (KLOR-CON) 20 MEQ tablet Take 1 tablet (20 mEq total) by mouth daily. 21 tablet 0   prochlorperazine (COMPAZINE) 10 MG tablet Take 1 tablet (10 mg total) by mouth every 6 (six) hours as needed (Nausea or vomiting). 30 tablet 1   propranolol (INDERAL) 20 MG tablet Take 1 tablet (20 mg total) by mouth 3 (three) times daily. 270 tablet 1   QUEtiapine (SEROQUEL) 50 MG tablet Take 1 tablet (50 mg total) by mouth at bedtime as needed. 90 tablet 0   triamcinolone cream (KENALOG) 0.1 % Apply 1 application topically 2 (two) times daily. 30 g 0   ALPRAZolam (XANAX) 0.5 MG tablet Take 1 tablet (0.5 mg total) by mouth 2 (two) times daily as needed for anxiety. (Patient not taking: Reported on 11/25/2020) 20 tablet 1   loperamide (IMODIUM A-D) 2 MG tablet Take 2 at onset of diarrhea, then 1 every 2hrs until 12hr without a BM. May take 2 tab every 4hrs at bedtime. If diarrhea recurs repeat. (Patient not taking: Reported on 11/25/2020) 100 tablet 1   naproxen sodium (ALEVE) 220 MG tablet Take 220 mg by mouth daily as needed. (Patient not taking: Reported on 11/25/2020)     No current facility-administered medications for this visit.   Facility-Administered Medications Ordered in Other Visits  Medication Dose Route Frequency Provider Last Rate Last Admin   heparin lock flush 100 UNIT/ML injection  sodium chloride flush (NS) 0.9 % injection 10 mL  10 mL Intracatheter PRN Sindy Guadel[place], Perez   10 mL at 11/03/20 1441     Musculoskeletal: Strength & Muscle Tone: within normal limits Gait & Station: normal Patient leans: N/A  Psychiatric Specialty Exam: Review of Systems  Gastrointestinal:  Positive for abdominal pain, diarrhea  and nausea.       Currently undergoing chemotherapy  All other systems reviewed and are negative.  Blood pressure 128/72, pulse 88, temperature 98.3 F (36.8 C), temperature source Temporal, weight 115 lb 12.8 oz (52.5 kg), last menstrual period 02/09/2008.Body mass index is 22.62 kg/m.  General Appearance: Casual  Eye Contact:  Fair  Speech:  Clear and Coherent  Volume:  Normal  Mood:  Euthymic  Affect:  Congruent  Thought Process:  Goal Directed and Descriptions of Associations: Intact  Orientation:  Full (Time, Place, and Person)  Thought Content: Logical   Suicidal Thoughts:  No  Homicidal Thoughts:  No  Memory:  Immediate;   Fair Recent;   Fair Remote;   Fair  Judgement:  Fair  Insight:  Fair  Psychomotor Activity:  Normal  Concentration:  Concentration: Fair and Attention Span: Fair  Recall:  [employer] of Knowledge: Fair  Language: Fair  Akathisia:  No  Handed:  Right  AIMS (if indicated): done  Assets:  Communication Skills Desire for Improvement Housing Social Support  ADL's:  Intact  Cognition: WNL  Sleep:  Fair   Screenings: AIMS    Flowsheet Row Video Visit from 04/21/2020 in [facility] Office Visit from 05/03/2016 in [facility] Admission (Discharged) from 01/15/2015 in [place] Total Score 0 0 0      AUDIT    Flowsheet Row Admission (Discharged) from 01/15/2015 in [facility]  Alcohol Use Disorder Identification Test Final Score (AUDIT) 1      GAD-7    Flowsheet Row Video Visit from 09/08/2020 in [place] Office Visit from 08/19/2020 in [place] Office Visit from 04/18/2015 in [facility]  Total GAD-7 Score 6 2 14       PHQ2-9    [place] Visit from 11/25/2020 in [place] Video Visit from 10/22/2020 in [place] Video Visit from 09/08/2020 in [place] Visit from 08/19/2020 in [place] Video Visit from 04/21/2020 in [place]  PHQ-2 Total Score 1 0 2 0 0  PHQ-9 Total Score 3 2 5  0 --      [place] Office Visit from 11/25/2020 in [place] Video Visit from 09/08/2020 in [place] Office Visit from 08/19/2020 in [place] No Risk No Risk No Risk        Assessment and Plan: HADEEL HILLEBRAND is a 60 year old Caucasian female who has a history of bipolar disorder, cannabis use disorder, tobacco use disorder, psoriatic arthritis, stage I pancreatic adenocarcinoma currently undergoing chemotherapy was evaluated in office today.  Patient is currently coping with her cancer diagnosis as well as treatment well, reports mood symptoms are stable.  Plan Bipolar disorder in remission Depakote 250 mg p.o. twice daily Seroquel 50 mg p.o. nightly as needed Depakote level-reviewed and discussed-dated 11/19/2020-25-subtherapeutic.  Patient however is not interested in making medication changes, as well as her mood symptoms are currently stable. AIMS -  0  Adjustment disorder with mixed anxiety and depressed mood-stable Patient was referred for psychotherapy sessions however she is not willing to start therapy at this time because of her schedule as well as financial problems.  Patient advised to contact her health insurance plan and let writer know if she is interested.  GAD-improving Depakote 250 mg p.o. twice daily Propranolol 10 mg p.o. 3 times daily Hydroxyzine 10 mg p.o. twice daily as needed for severe anxiety attacks  High risk medication use-I have reviewed and discussed Depakote level-dated 11/19/2020-25-subtherapeutic.  Tobacco use disorder-in  remission Patient quit smoking.  Cannabis use disorder in remission-Will monitor closely.  Review of notes per oncologist-NP Ms. Allen as noted above.  Follow-up in clinic in 2 months or sooner in office.  This note was generated in part or whole with voice recognition software. Voice recognition is usually quite accurate but there are transcription errors that can and very often do occur. I apologize for any typographical errors that were not detected and corrected.     Sheryl Alert, Perez 11/26/2020, 5:29 PM

## 2020-11-28 ENCOUNTER — Encounter: Payer: Self-pay | Admitting: Oncology

## 2020-11-28 ENCOUNTER — Inpatient Hospital Stay: Payer: BC Managed Care – PPO

## 2020-11-28 ENCOUNTER — Other Ambulatory Visit: Payer: Self-pay

## 2020-11-28 ENCOUNTER — Inpatient Hospital Stay (HOSPITAL_BASED_OUTPATIENT_CLINIC_OR_DEPARTMENT_OTHER): Payer: BC Managed Care – PPO | Admitting: Oncology

## 2020-11-28 VITALS — BP 123/40 | HR 68 | Temp 97.3°F | Resp 17 | Wt 115.0 lb

## 2020-11-28 DIAGNOSIS — C259 Malignant neoplasm of pancreas, unspecified: Secondary | ICD-10-CM

## 2020-11-28 DIAGNOSIS — Z5111 Encounter for antineoplastic chemotherapy: Secondary | ICD-10-CM

## 2020-11-28 DIAGNOSIS — T451X5A Adverse effect of antineoplastic and immunosuppressive drugs, initial encounter: Secondary | ICD-10-CM | POA: Diagnosis not present

## 2020-11-28 DIAGNOSIS — M545 Low back pain, unspecified: Secondary | ICD-10-CM | POA: Diagnosis not present

## 2020-11-28 DIAGNOSIS — D6481 Anemia due to antineoplastic chemotherapy: Secondary | ICD-10-CM | POA: Diagnosis not present

## 2020-11-28 DIAGNOSIS — R220 Localized swelling, mass and lump, head: Secondary | ICD-10-CM | POA: Diagnosis not present

## 2020-11-28 DIAGNOSIS — Z7952 Long term (current) use of systemic steroids: Secondary | ICD-10-CM | POA: Diagnosis not present

## 2020-11-28 DIAGNOSIS — G8929 Other chronic pain: Secondary | ICD-10-CM | POA: Diagnosis not present

## 2020-11-28 DIAGNOSIS — K769 Liver disease, unspecified: Secondary | ICD-10-CM | POA: Diagnosis not present

## 2020-11-28 DIAGNOSIS — Z5189 Encounter for other specified aftercare: Secondary | ICD-10-CM | POA: Diagnosis not present

## 2020-11-28 DIAGNOSIS — R5383 Other fatigue: Secondary | ICD-10-CM | POA: Diagnosis not present

## 2020-11-28 DIAGNOSIS — R1013 Epigastric pain: Secondary | ICD-10-CM | POA: Diagnosis not present

## 2020-11-28 DIAGNOSIS — R1012 Left upper quadrant pain: Secondary | ICD-10-CM | POA: Diagnosis not present

## 2020-11-28 DIAGNOSIS — C253 Malignant neoplasm of pancreatic duct: Secondary | ICD-10-CM | POA: Diagnosis not present

## 2020-11-28 DIAGNOSIS — F319 Bipolar disorder, unspecified: Secondary | ICD-10-CM | POA: Diagnosis not present

## 2020-11-28 DIAGNOSIS — E876 Hypokalemia: Secondary | ICD-10-CM | POA: Diagnosis not present

## 2020-11-28 DIAGNOSIS — K580 Irritable bowel syndrome with diarrhea: Secondary | ICD-10-CM | POA: Diagnosis not present

## 2020-11-28 LAB — CBC WITH DIFFERENTIAL/PLATELET
Abs Immature Granulocytes: 0.36 10*3/uL — ABNORMAL HIGH (ref 0.00–0.07)
Basophils Absolute: 0.1 10*3/uL (ref 0.0–0.1)
Basophils Relative: 1 %
Eosinophils Absolute: 0.1 10*3/uL (ref 0.0–0.5)
Eosinophils Relative: 1 %
HCT: 31.7 % — ABNORMAL LOW (ref 36.0–46.0)
Hemoglobin: 10.4 g/dL — ABNORMAL LOW (ref 12.0–15.0)
Immature Granulocytes: 4 %
Lymphocytes Relative: 19 %
Lymphs Abs: 1.9 10*3/uL (ref 0.7–4.0)
MCH: 34.4 pg — ABNORMAL HIGH (ref 26.0–34.0)
MCHC: 32.8 g/dL (ref 30.0–36.0)
MCV: 105 fL — ABNORMAL HIGH (ref 80.0–100.0)
Monocytes Absolute: 0.8 10*3/uL (ref 0.1–1.0)
Monocytes Relative: 8 %
Neutro Abs: 6.9 10*3/uL (ref 1.7–7.7)
Neutrophils Relative %: 67 %
Platelets: 145 10*3/uL — ABNORMAL LOW (ref 150–400)
RBC: 3.02 MIL/uL — ABNORMAL LOW (ref 3.87–5.11)
RDW: 16.9 % — ABNORMAL HIGH (ref 11.5–15.5)
WBC: 10.1 10*3/uL (ref 4.0–10.5)
nRBC: 0 % (ref 0.0–0.2)

## 2020-11-28 LAB — COMPREHENSIVE METABOLIC PANEL
ALT: 26 U/L (ref 0–44)
AST: 37 U/L (ref 15–41)
Albumin: 3.8 g/dL (ref 3.5–5.0)
Alkaline Phosphatase: 111 U/L (ref 38–126)
Anion gap: 7 (ref 5–15)
BUN: 8 mg/dL (ref 6–20)
CO2: 26 mmol/L (ref 22–32)
Calcium: 8.8 mg/dL — ABNORMAL LOW (ref 8.9–10.3)
Chloride: 105 mmol/L (ref 98–111)
Creatinine, Ser: 0.73 mg/dL (ref 0.44–1.00)
GFR, Estimated: >60 mL/min (ref >60)
Glucose, Bld: 96 mg/dL (ref 70–99)
Potassium: 3.9 mmol/L (ref 3.5–5.1)
Sodium: 138 mmol/L (ref 135–145)
Total Bilirubin: 0.4 mg/dL (ref 0.3–1.2)
Total Protein: 7.2 g/dL (ref 6.5–8.1)

## 2020-11-28 MED ORDER — DEXTROSE 5 % IV SOLN
Freq: Once | INTRAVENOUS | Status: AC
  Filled 2020-11-28: qty 250

## 2020-11-28 MED ORDER — SODIUM CHLORIDE 0.9 % IV SOLN
2400.0000 mg/m2 | INTRAVENOUS | Status: DC
  Administered 2020-11-28: 3600 mg via INTRAVENOUS
  Filled 2020-11-28: qty 72

## 2020-11-28 MED ORDER — SODIUM CHLORIDE 0.9 % IV SOLN
INTRAVENOUS | Status: DC
  Filled 2020-11-28: qty 250

## 2020-11-28 MED ORDER — OXALIPLATIN CHEMO INJECTION 100 MG/20ML
65.0000 mg/m2 | Freq: Once | INTRAVENOUS | Status: AC
  Administered 2020-11-28: 100 mg via INTRAVENOUS
  Filled 2020-11-28: qty 20

## 2020-11-28 MED ORDER — SODIUM CHLORIDE 0.9 % IV SOLN
150.0000 mg/m2 | Freq: Once | INTRAVENOUS | Status: AC
  Administered 2020-11-28: 220 mg via INTRAVENOUS
  Filled 2020-11-28: qty 10

## 2020-11-28 MED ORDER — SODIUM CHLORIDE 0.9% FLUSH
10.0000 mL | Freq: Once | INTRAVENOUS | Status: AC
  Administered 2020-11-28: 10 mL via INTRAVENOUS
  Filled 2020-11-28: qty 10

## 2020-11-28 MED ORDER — SODIUM CHLORIDE 0.9 % IV SOLN
150.0000 mg | Freq: Once | INTRAVENOUS | Status: AC
  Administered 2020-11-28: 150 mg via INTRAVENOUS
  Filled 2020-11-28: qty 150

## 2020-11-28 MED ORDER — ATROPINE SULFATE 1 MG/ML IV SOLN
0.5000 mg | Freq: Once | INTRAVENOUS | Status: AC | PRN
  Administered 2020-11-28: 0.5 mg via INTRAVENOUS
  Filled 2020-11-28: qty 1

## 2020-11-28 MED ORDER — SODIUM CHLORIDE 0.9 % IV SOLN
397.0000 mg/m2 | Freq: Once | INTRAVENOUS | Status: AC
  Administered 2020-11-28: 600 mg via INTRAVENOUS
  Filled 2020-11-28: qty 30

## 2020-11-28 MED ORDER — PALONOSETRON HCL INJECTION 0.25 MG/5ML
0.2500 mg | Freq: Once | INTRAVENOUS | Status: AC
  Administered 2020-11-28: 0.25 mg via INTRAVENOUS
  Filled 2020-11-28: qty 5

## 2020-11-28 MED ORDER — SODIUM CHLORIDE 0.9 % IV SOLN
10.0000 mg | Freq: Once | INTRAVENOUS | Status: AC
  Administered 2020-11-28: 10 mg via INTRAVENOUS
  Filled 2020-11-28: qty 10

## 2020-11-28 NOTE — Patient Instructions (Signed)
CANCER CENTER [facility]  Discharge Instructions: Thank you for choosing [facility] to provide your oncology and hematology care.  If you have a lab appointment with the Cancer Center, please go directly to the Cancer Center and check in at the registration area.  Wear comfortable clothing and clothing appropriate for easy access to any Portacath or PICC line.   We strive to give you quality time with your provider. You may need to reschedule your appointment if you arrive late (15 or more minutes).  Arriving late affects you and other patients whose appointments are after yours.  Also, if you miss three or more appointments without notifying the office, you may be dismissed from the clinic at the provider's discretion.      For prescription refill requests, have your pharmacy contact our office and allow 72 hours for refills to be completed.    Today you received the following chemotherapy and/or immunotherapy agents       To help prevent nausea and vomiting after your treatment, we encourage you to take your nausea medication as directed.  BELOW ARE SYMPTOMS THAT SHOULD BE REPORTED IMMEDIATELY: *FEVER GREATER THAN 100.4 F (38 C) OR HIGHER *CHILLS OR SWEATING *NAUSEA AND VOMITING THAT IS NOT CONTROLLED WITH YOUR NAUSEA MEDICATION *UNUSUAL SHORTNESS OF BREATH *UNUSUAL BRUISING OR BLEEDING *URINARY PROBLEMS (pain or burning when urinating, or frequent urination) *BOWEL PROBLEMS (unusual diarrhea, constipation, pain near the anus) TENDERNESS IN MOUTH AND THROAT WITH OR WITHOUT PRESENCE OF ULCERS (sore throat, sores in mouth, or a toothache) UNUSUAL RASH, SWELLING OR PAIN  UNUSUAL VAGINAL DISCHARGE OR ITCHING   Items with * indicate a potential emergency and should be followed up as soon as possible or go to the Emergency Department if any problems should occur.  Please show the CHEMOTHERAPY ALERT CARD or IMMUNOTHERAPY ALERT CARD at check-in to the  Emergency Department and triage nurse.  Should you have questions after your visit or need to cancel or reschedule your appointment, please contact CANCER CENTER  [facility]  [phone] and follow the prompts.  Office hours are 8:00 a.m. to 4:30 p.m. Monday - Friday. Please note that voicemails left after 4:00 p.m. may not be returned until the following business day.  We are closed weekends and major holidays. You have access to a nurse at all times for urgent questions. Please call the main number to the clinic [phone] and follow the prompts.  For any non-urgent questions, you may also contact your provider using MyChart. We now offer e-Visits for anyone 18 and older to request care online for non-urgent symptoms. For details visit [email or web address].   Also download the MyChart app! Go to the app store, search "MyChart", open the app, select [facility], and log in with your MyChart username and password.  Due to Covid, a mask is required upon entering the hospital/clinic. If you do not have a mask, one will be given to you upon arrival. For doctor visits, patients may have 1 support person aged 18 or older with them. For treatment visits, patients cannot have anyone with them due to current Covid guidelines and our immunocompromised population.  

## 2020-11-28 NOTE — Progress Notes (Signed)
Patient here for oncology follow-up appointment, concerns of diarrhea   

## 2020-11-28 NOTE — Progress Notes (Signed)
Nutrition Follow-up:  Patient with stage I pancreatic cancer.  Patient receiving folfirinox.    Met with patient during infusion.  Patient reports that she is eating better.  Continues drinking her 2 boost a day.  Eating lava cakes and popcorn cheesy puffs.  Eating her frozen meals for lunch and Crackin egg for breakfast.  Tries to eat snack before bed.  Reports more diarrhea this time but takes an imodium 1 time a day or every other day depending on diarrhea.      Medications: reviewed  Labs: reviewed  Anthropometrics:   Weight 115 lb today 113 lb 14.4 oz on 10/7 113 lb 12.8 oz on 9/23 111 lb 9/6 oz on 9/9 113 lb 4.8 oz on 8/19  UBW of 120-125 lb   NUTRITION DIAGNOSIS: Unintentional weight loss stable   INTERVENTION:  Continue boost plus shakes BID Continue antidiarrheal medication     MONITORING, EVALUATION, GOAL: weight trends, intake   NEXT VISIT: Friday, Nov 4th during infusion  Sheryl Perez B. Zenia Resides, [place], [place] Registered Dietitian [phone] (mobile)

## 2020-11-29 LAB — CANCER ANTIGEN 19-9: CA 19-9: 49 U/mL — ABNORMAL HIGH (ref 0–35)

## 2020-12-01 ENCOUNTER — Inpatient Hospital Stay: Payer: BC Managed Care – PPO

## 2020-12-01 ENCOUNTER — Other Ambulatory Visit: Payer: Self-pay

## 2020-12-01 ENCOUNTER — Encounter: Payer: Self-pay | Admitting: Oncology

## 2020-12-01 VITALS — BP 110/64 | HR 84 | Temp 96.3°F | Resp 18

## 2020-12-01 DIAGNOSIS — G8929 Other chronic pain: Secondary | ICD-10-CM | POA: Diagnosis not present

## 2020-12-01 DIAGNOSIS — C259 Malignant neoplasm of pancreas, unspecified: Secondary | ICD-10-CM

## 2020-12-01 DIAGNOSIS — T451X5A Adverse effect of antineoplastic and immunosuppressive drugs, initial encounter: Secondary | ICD-10-CM | POA: Diagnosis not present

## 2020-12-01 DIAGNOSIS — C253 Malignant neoplasm of pancreatic duct: Secondary | ICD-10-CM | POA: Diagnosis not present

## 2020-12-01 DIAGNOSIS — R1012 Left upper quadrant pain: Secondary | ICD-10-CM | POA: Diagnosis not present

## 2020-12-01 DIAGNOSIS — E876 Hypokalemia: Secondary | ICD-10-CM | POA: Diagnosis not present

## 2020-12-01 DIAGNOSIS — F319 Bipolar disorder, unspecified: Secondary | ICD-10-CM | POA: Diagnosis not present

## 2020-12-01 DIAGNOSIS — Z7952 Long term (current) use of systemic steroids: Secondary | ICD-10-CM | POA: Diagnosis not present

## 2020-12-01 DIAGNOSIS — K580 Irritable bowel syndrome with diarrhea: Secondary | ICD-10-CM | POA: Diagnosis not present

## 2020-12-01 DIAGNOSIS — K769 Liver disease, unspecified: Secondary | ICD-10-CM | POA: Diagnosis not present

## 2020-12-01 DIAGNOSIS — M545 Low back pain, unspecified: Secondary | ICD-10-CM | POA: Diagnosis not present

## 2020-12-01 DIAGNOSIS — Z5189 Encounter for other specified aftercare: Secondary | ICD-10-CM | POA: Diagnosis not present

## 2020-12-01 DIAGNOSIS — R220 Localized swelling, mass and lump, head: Secondary | ICD-10-CM | POA: Diagnosis not present

## 2020-12-01 DIAGNOSIS — R5383 Other fatigue: Secondary | ICD-10-CM | POA: Diagnosis not present

## 2020-12-01 DIAGNOSIS — D6481 Anemia due to antineoplastic chemotherapy: Secondary | ICD-10-CM | POA: Diagnosis not present

## 2020-12-01 DIAGNOSIS — Z5111 Encounter for antineoplastic chemotherapy: Secondary | ICD-10-CM | POA: Diagnosis not present

## 2020-12-01 DIAGNOSIS — R1013 Epigastric pain: Secondary | ICD-10-CM | POA: Diagnosis not present

## 2020-12-01 MED ORDER — HEPARIN SOD (PORK) LOCK FLUSH 100 UNIT/ML IV SOLN
500.0000 [IU] | Freq: Once | INTRAVENOUS | Status: AC | PRN
  Filled 2020-12-01: qty 5

## 2020-12-01 MED ORDER — SODIUM CHLORIDE 0.9% FLUSH
10.0000 mL | INTRAVENOUS | Status: DC | PRN
  Administered 2020-12-01: 10 mL
  Filled 2020-12-01: qty 10

## 2020-12-01 MED ORDER — HEPARIN SOD (PORK) LOCK FLUSH 100 UNIT/ML IV SOLN
INTRAVENOUS | Status: AC
  Administered 2020-12-01: 500 [IU]
  Filled 2020-12-01: qty 5

## 2020-12-05 ENCOUNTER — Telehealth: Payer: Self-pay | Admitting: *Deleted

## 2020-12-05 NOTE — Telephone Encounter (Signed)
Pt had called earlier in the week and I was on vacation and staff left me a message to call her back. Pt has ct scan in [facility] 11/18 and getting treatment for cancer 11/4. I spoke to Dr. Janese Banks and she says all these appts are great. The patient is thankful

## 2020-12-06 ENCOUNTER — Other Ambulatory Visit: Payer: Self-pay | Admitting: Nurse Practitioner

## 2020-12-07 ENCOUNTER — Encounter: Payer: Self-pay | Admitting: Oncology

## 2020-12-07 NOTE — Progress Notes (Signed)
Hematology/Oncology Consult note [facility]  Telephone:([phone] [phone]  Patient Care Team: Cletis Athens, MD as PCP - General (Internal Medicine) Grant Fontana, [facility] as Referring Physician (Chiropractic Medicine) Christene Lye, MD (General Surgery) Sindy Guadeloupe, MD as Consulting Physician (Hematology and Oncology)   Name of the patient: Sheryl Perez  [id number]  Sep 28, 1960   Date of visit: 12/07/20  Diagnosis- stage I pancreatic cancer  Chief complaint/ Reason for visit-on treatment assessment prior to cycle 5 of neoadjuvant modified FOLFIRINOX chemotherapy  Heme/Onc history: patient is a 60 year old female with a history of irritable bowel disease for which she follows up with [facility].  She has undergone EGD and colonoscopy with them in the past.  More recently patient was complaining of left upper quadrant and epigastric abdominal pain which causes sudden episodes of abdominal spasms and was prescribed as needed tramadol for it.  These episodes of pain are unrelated to food and are dull aching or throbbing.  She has undergone cholecystectomy in the past.  She underwent CT abdomen and pelvis with contrast in the ER on 08/04/2020 Which did not reveal any acute pathology which showed a possible hypoechoic 1.9 cm right hepatic lobe lesion.  This was followed by an MRI which showed no discrete lesion in the liver and the area of concern noted on CT scan was more likely to represent variable fat deposition.  However there was loss of normal T1 signal in the peripheral pancreas and a potential small pancreatic lesion suspicious for early pancreatic adenocarcinoma in the neck of the pancreas.     Patient underwent EUS by Dr. Mont Dutton which showed an irregular mass in the pancreatic neck measuring 15 x 11 mm.  FNA showed adenocarcinoma.  Pancreatic duct measured 1.1 mm in the head with abrupt dilatation in the region of the mass to 4.1 mm.  No  abnormality noted in the common bile duct and hepatic duct.  Celiac region showed no significant endosonographic abnormality.  No lymphadenopathy was seen.  CA 19-9 mildly elevated at 35   Patient was seen by Dr. Hyman Hopes from pancreaticobiliary surgery at [facility] for surgical opinion.  Although patient has a small lesion involving the pancreatic neck, there was a concern for tumor extension towards the splenic vein and narrowing of portal splenic confluence.  Neoadjuvant chemotherapy for 3 months was therefore favored  Interval history-tolerating chemotherapy well.  Has baseline fatigue which has remained unchanged.  Occasional self-limited diarrhea.  Denies any significant nausea or vomiting.  ECOG PS- 1 Pain scale- 0   Review of systems- Review of Systems  Constitutional:  Positive for malaise/fatigue. Negative for chills, fever and weight loss.  HENT:  Negative for congestion, ear discharge and nosebleeds.   Eyes:  Negative for blurred vision.  Respiratory:  Negative for cough, hemoptysis, sputum production, shortness of breath and wheezing.   Cardiovascular:  Negative for chest pain, palpitations, orthopnea and claudication.  Gastrointestinal:  Negative for abdominal pain, blood in stool, constipation, diarrhea, heartburn, melena, nausea and vomiting.  Genitourinary:  Negative for dysuria, flank pain, frequency, hematuria and urgency.  Musculoskeletal:  Negative for back pain, joint pain and myalgias.  Skin:  Negative for rash.  Neurological:  Negative for dizziness, tingling, focal weakness, seizures, weakness and headaches.  Endo/Heme/Allergies:  Does not bruise/bleed easily.  Psychiatric/Behavioral:  Negative for depression and suicidal ideas. The patient does not have insomnia.       Allergies  Allergen Reactions   Sulfacetamide Sodium  Swelling    Diarrhea,dizziness, tongue swelling   Other Other (See Comments) and Swelling    PT states that anything with the sides effects "may  cause rash" she cannot take due to her psoriasis, it will make the patches on her hand so thick she can't use her hands.  Diarrhea,dizziness, tongue swelling   Oxycontin [Oxycodone Hcl] Nausea And Vomiting   Sulfa Antibiotics Swelling    Diarrhea,dizziness, tongue swelling   Abilify [Aripiprazole] Other (See Comments)   Cephalosporins Rash   Erythromycin Other (See Comments)    Abd. pain Abd. pain Other reaction(s): Other (See Comments) Abd. 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    Bipolar disorder (HCC)    Chest pain, unspecified    Chronic low back pain 10/23/2013   Chronic UTI    COPD (chronic obstructive pulmonary disease) (HCC)    Depression    Dysfunction of eustachian tube    Enlargement of lymph nodes    Family history of brain cancer    Family history of breast cancer    Family history of prostate cancer    History of Bell's palsy Right   Injury, other and unspecified, knee, leg, ankle, and foot    MRSA (methicillin resistant staph aureus) culture positive 07/11/2014   abscess   Other specified disease of hair and hair follicles    Pancreatic cancer ([place])    Pancreatic cancer ([place])    Rectal prolapse    Unspecified symptom associated with female genital organs      Past Surgical History:  Procedure Laterality Date   ABDOMINAL HYSTERECTOMY     bladder tack  2010   CHOLECYSTECTOMY  2002   FOOT SURGERY Bilateral 2004   bunion   GANGLION CYST EXCISION  2002   INCISION AND DRAINAGE ABSCESS  04-16-15   PARTIAL HYSTERECTOMY  2010   uterus removed   PORTA CATH INSERTION N/A 09/11/2020   Procedure: PORTA CATH INSERTION;  Surgeon: Algernon Huxley, MD;  Location: [facility];  Service: Cardiovascular;  Laterality: N/A;    Social History    Socioeconomic History   Marital status: Married    Spouse name: Not on file   Number of children: 3   Years of education: hs   Highest education level: Some college, no degree  Occupational History   Not on file  Tobacco Use   Smoking status: Former    Packs/day: 0.50    Years: 40.00    Pack years: 20.00    Types: Cigarettes    Quit date: 07/14/2020    Years since quitting: 0.4   Smokeless tobacco: Never  Vaping Use   Vaping Use: Never used  Substance and Sexual Activity   Alcohol use: No    Alcohol/week: 1.0 standard drink    Types: 1 Glasses of wine per week    Comment: occasional   Drug use: Yes    Types: Marijuana    Comment: last used 05-20-15   Sexual activity: Yes    Birth control/protection: None  Other Topics Concern   Not on file  Social History Narrative   Patient lives at home with her husband [name]).   Patient works full time.   Education CNA  Right handed.   Caffeine Tea , Soda.   Social Determinants of Health   Financial Resource Strain: Not on file  Food Insecurity: Not on file  Transportation Needs: Not on file  Physical Activity: Not on file  Stress: Not on file  Social Connections: Not on file  Intimate Partner Violence: Not on file    Family History  Problem Relation Age of Onset   Diabetes Father    Cancer Father        brain tumor   Depression Father    Depression Sister    Alcohol abuse Brother    Depression Brother    Leukemia Maternal Aunt    Prostate cancer Maternal Uncle    Breast cancer Paternal Aunt        dx >50   Heart disease Maternal Grandmother    Heart attack Maternal Grandmother    Stroke Maternal Grandmother    Parkinson's disease Maternal Grandfather    Leukemia Paternal Grandmother    Diabetes Paternal Grandfather    Breast cancer Cousin    Brain cancer Niece        dx under 3     Current Outpatient Medications:    Ascorbic Acid (VITAMIN C) 1000 MG tablet, Take 1,000 mg by mouth daily., Disp: ,  Rfl:    dexamethasone (DECADRON) 4 MG tablet, Take 2 tablets (8 mg total) by mouth daily. Start the day after chemotherapy for 3 days. Take with food., Disp: 8 tablet, Rfl: 5   divalproex (DEPAKOTE) 250 MG DR tablet, Take 1 tablet (250 mg total) by mouth 2 (two) times daily., Disp: 180 tablet, Rfl: 1   famotidine (PEPCID) 20 MG tablet, Take 20 mg by mouth daily., Disp: , Rfl:    ferrous sulfate 325 (65 FE) MG tablet, Take 325 mg by mouth 2 (two) times daily with a meal., Disp: , Rfl:    fluticasone (FLONASE) 50 MCG/ACT nasal spray, Place 1 spray into both nostrils daily. (Patient taking differently: Place 1 spray into both nostrils daily as needed.), Disp: 11.1 mL, Rfl: 2   loperamide (IMODIUM A-D) 2 MG tablet, Take 2 at onset of diarrhea, then 1 every 2hrs until 12hr without a BM. May take 2 tab every 4hrs at bedtime. If diarrhea recurs repeat., Disp: 100 tablet, Rfl: 1   loratadine (CLARITIN) 10 MG tablet, TAKE 1 TABLET BY MOUTH EVERY DAY, Disp: 90 tablet, Rfl: 1   Multiple Vitamin (MULTIVITAMIN) tablet, Take 1 tablet by mouth daily., Disp: , Rfl:    ondansetron (ZOFRAN) 8 MG tablet, Take 1 tablet (8 mg total) by mouth 2 (two) times daily as needed. Start on day 3 after chemotherapy., Disp: 30 tablet, Rfl: 1   potassium chloride SA (KLOR-CON) 20 MEQ tablet, Take 1 tablet (20 mEq total) by mouth daily., Disp: 21 tablet, Rfl: 0   prochlorperazine (COMPAZINE) 10 MG tablet, Take 1 tablet (10 mg total) by mouth every 6 (six) hours as needed (Nausea or vomiting)., Disp: 30 tablet, Rfl: 1   propranolol (INDERAL) 20 MG tablet, Take 1 tablet (20 mg total) by mouth 3 (three) times daily., Disp: 270 tablet, Rfl: 1   QUEtiapine (SEROQUEL) 50 MG tablet, Take 1 tablet (50 mg total) by mouth at bedtime as needed., Disp: 90 tablet, Rfl: 0   triamcinolone cream (KENALOG) 0.1 %, Apply 1 application topically 2 (two) times daily., Disp: 30 g, Rfl: 0   ALPRAZolam (XANAX) 0.5 MG tablet, Take 1 tablet (0.5 mg total) by  mouth 2 (two)  times daily as needed for anxiety. (Patient not taking: No sig reported), Disp: 20 tablet, Rfl: 1   naproxen sodium (ALEVE) 220 MG tablet, Take 220 mg by mouth daily as needed. (Patient not taking: No sig reported), Disp: , Rfl:  No current facility-administered medications for this visit.  Facility-Administered Medications Ordered in Other Visits:    heparin lock flush 100 UNIT/ML injection, , , ,    sodium chloride flush (NS) 0.9 % injection 10 mL, 10 mL, Intracatheter, PRN, Sindy Guadeloupe, MD, 10 mL at 11/03/20 1441  Physical exam:  Vitals:   11/28/20 0900  BP: (!) 123/40  Pulse: 68  Resp: 17  Temp: (!) 97.3 F (36.3 C)  TempSrc: Tympanic  SpO2: 100%  Weight: 115 lb (52.2 kg)   Physical Exam Cardiovascular:     Rate and Rhythm: Normal rate and regular rhythm.     Heart sounds: Normal heart sounds.  Pulmonary:     Effort: Pulmonary effort is normal.     Breath sounds: Normal breath sounds.  Abdominal:     General: Bowel sounds are normal.     Palpations: Abdomen is soft.  Skin:    General: Skin is warm and dry.  Neurological:     Mental Status: She is alert and oriented to person, place, and time.     CMP Latest Ref Rng & Units 11/28/2020  Glucose 70 - 99 mg/dL 96  BUN 6 - 20 mg/dL 8  Creatinine 0.44 - 1.00 mg/dL 0.73  Sodium 135 - 145 mmol/L 138  Potassium 3.5 - 5.1 mmol/L 3.9  Chloride 98 - 111 mmol/L 105  CO2 22 - 32 mmol/L 26  Calcium 8.9 - 10.3 mg/dL 8.8(L)  Total Protein 6.5 - 8.1 g/dL 7.2  Total Bilirubin 0.3 - 1.2 mg/dL 0.4  Alkaline Phos 38 - 126 U/L 111  AST 15 - 41 U/L 37  ALT 0 - 44 U/L 26   CBC Latest Ref Rng & Units 11/28/2020  WBC 4.0 - 10.5 K/uL 10.1  Hemoglobin 12.0 - 15.0 g/dL 10.4(L)  Hematocrit 36.0 - 46.0 % 31.7(L)  Platelets 150 - 400 K/uL 145(L)     Assessment and plan- Patient is a 60 y.o. female with stage I pancreatic neck adenocarcinoma cT1 cN0 M0 borderline resectable.  She is here for on treatment assessment prior  to cycle 5 of neoadjuvant modified FOLFIRINOX chemotherapy  Counts okay to proceed with cycle 5 of neoadjuvant modified FOLFIRINOX chemotherapy today.  Pump disconnect on day 3.  She does not require Udenyca with this cycle.  I will see her back in 2 weeks for cycle 6.  That we will complete 3 months of neoadjuvant treatment.  I will reach out to Dr. Hyman Hopes at [facility] to evaluate her for potential surgery at this time with a repeat scan.  We could either complete 6 months of neoadjuvant chemotherapy and then proceed for surgery for surgery now and complete 3 months of chemotherapy adjuvantly.  It will depend on what the scans show.  Patient verbalized understanding   Visit Diagnosis 1. Encounter for antineoplastic chemotherapy   2. Pancreatic adenocarcinoma [facility])      Dr. Randa Evens, MD, MPH [facility] [phone] 12/07/2020 8:18 AM

## 2020-12-09 ENCOUNTER — Encounter (INDEPENDENT_AMBULATORY_CARE_PROVIDER_SITE_OTHER): Payer: Self-pay

## 2020-12-09 ENCOUNTER — Ambulatory Visit (INDEPENDENT_AMBULATORY_CARE_PROVIDER_SITE_OTHER): Payer: No Typology Code available for payment source

## 2020-12-09 NOTE — Pre-Procedure Instructions (Signed)
Important Instructions Before Your Procedure    Pre op instructions reviewed and email sent to [email or web address]    Your case is currently scheduled for 12/18/2020 at 0730 with Pan, Franchot Mimes, MD.      The date and/or time of your surgery may change.  Your surgeon's office will notify you, up until the business day before surgery, if there is any change to your surgery date or time.  Please don't hesitate to call your surgeon's office directly with any questions.        IMPORTANT You must visit the Preparing for Your Procedure guide link below for additional instructions including fasting guidelines and directions before your procedure. If you received fasting or skin preparation instructions from your surgeon or pre-procedural provider, please follow those specific instructions. The instructions here are general instructions that do not pertain to all patients.  [email or web address]    If the link doesn't open, please copy and paste in your browser    QUESTIONS?  If you have any questions about your pre-procedural evaluation, please call the clinic location where your appointment was performed:     [facility]: [phone]  [facility]: [phone]  [facility]: [phone]  [place]: [phone]  [place]: [phone]

## 2020-12-10 ENCOUNTER — Encounter: Payer: Self-pay | Admitting: Oncology

## 2020-12-12 ENCOUNTER — Encounter: Payer: Self-pay | Admitting: Oncology

## 2020-12-12 ENCOUNTER — Inpatient Hospital Stay (HOSPITAL_BASED_OUTPATIENT_CLINIC_OR_DEPARTMENT_OTHER): Payer: BC Managed Care – PPO | Admitting: Oncology

## 2020-12-12 ENCOUNTER — Other Ambulatory Visit: Payer: Self-pay | Admitting: *Deleted

## 2020-12-12 ENCOUNTER — Inpatient Hospital Stay: Payer: BC Managed Care – PPO | Attending: Oncology

## 2020-12-12 ENCOUNTER — Inpatient Hospital Stay: Payer: BC Managed Care – PPO

## 2020-12-12 ENCOUNTER — Other Ambulatory Visit: Payer: Self-pay

## 2020-12-12 VITALS — BP 118/51 | HR 60 | Temp 97.5°F | Resp 16 | Wt 115.9 lb

## 2020-12-12 DIAGNOSIS — C257 Malignant neoplasm of other parts of pancreas: Secondary | ICD-10-CM | POA: Insufficient documentation

## 2020-12-12 DIAGNOSIS — Z5111 Encounter for antineoplastic chemotherapy: Secondary | ICD-10-CM

## 2020-12-12 DIAGNOSIS — Z818 Family history of other mental and behavioral disorders: Secondary | ICD-10-CM | POA: Diagnosis not present

## 2020-12-12 DIAGNOSIS — Z833 Family history of diabetes mellitus: Secondary | ICD-10-CM | POA: Diagnosis not present

## 2020-12-12 DIAGNOSIS — Z808 Family history of malignant neoplasm of other organs or systems: Secondary | ICD-10-CM | POA: Diagnosis not present

## 2020-12-12 DIAGNOSIS — Z8249 Family history of ischemic heart disease and other diseases of the circulatory system: Secondary | ICD-10-CM | POA: Diagnosis not present

## 2020-12-12 DIAGNOSIS — Z79899 Other long term (current) drug therapy: Secondary | ICD-10-CM | POA: Diagnosis not present

## 2020-12-12 DIAGNOSIS — Z87891 Personal history of nicotine dependence: Secondary | ICD-10-CM | POA: Insufficient documentation

## 2020-12-12 DIAGNOSIS — T451X5A Adverse effect of antineoplastic and immunosuppressive drugs, initial encounter: Secondary | ICD-10-CM | POA: Diagnosis not present

## 2020-12-12 DIAGNOSIS — Z811 Family history of alcohol abuse and dependence: Secondary | ICD-10-CM | POA: Insufficient documentation

## 2020-12-12 DIAGNOSIS — Z806 Family history of leukemia: Secondary | ICD-10-CM | POA: Insufficient documentation

## 2020-12-12 DIAGNOSIS — Z823 Family history of stroke: Secondary | ICD-10-CM | POA: Diagnosis not present

## 2020-12-12 DIAGNOSIS — Z803 Family history of malignant neoplasm of breast: Secondary | ICD-10-CM | POA: Insufficient documentation

## 2020-12-12 DIAGNOSIS — R5383 Other fatigue: Secondary | ICD-10-CM | POA: Diagnosis not present

## 2020-12-12 DIAGNOSIS — F319 Bipolar disorder, unspecified: Secondary | ICD-10-CM | POA: Diagnosis not present

## 2020-12-12 DIAGNOSIS — Z832 Family history of diseases of the blood and blood-forming organs and certain disorders involving the immune mechanism: Secondary | ICD-10-CM | POA: Diagnosis not present

## 2020-12-12 DIAGNOSIS — D6481 Anemia due to antineoplastic chemotherapy: Secondary | ICD-10-CM | POA: Diagnosis not present

## 2020-12-12 DIAGNOSIS — C259 Malignant neoplasm of pancreas, unspecified: Secondary | ICD-10-CM

## 2020-12-12 DIAGNOSIS — R110 Nausea: Secondary | ICD-10-CM | POA: Insufficient documentation

## 2020-12-12 DIAGNOSIS — K589 Irritable bowel syndrome without diarrhea: Secondary | ICD-10-CM | POA: Diagnosis not present

## 2020-12-12 DIAGNOSIS — Z8042 Family history of malignant neoplasm of prostate: Secondary | ICD-10-CM | POA: Insufficient documentation

## 2020-12-12 LAB — COMPREHENSIVE METABOLIC PANEL
ALT: 19 U/L (ref 0–44)
AST: 30 U/L (ref 15–41)
Albumin: 3.5 g/dL (ref 3.5–5.0)
Alkaline Phosphatase: 89 U/L (ref 38–126)
Anion gap: 6 (ref 5–15)
BUN: 9 mg/dL (ref 6–20)
CO2: 27 mmol/L (ref 22–32)
Calcium: 8.7 mg/dL — ABNORMAL LOW (ref 8.9–10.3)
Chloride: 106 mmol/L (ref 98–111)
Creatinine, Ser: 0.81 mg/dL (ref 0.44–1.00)
GFR, Estimated: >60 mL/min (ref >60)
Glucose, Bld: 121 mg/dL — ABNORMAL HIGH (ref 70–99)
Potassium: 4 mmol/L (ref 3.5–5.1)
Sodium: 139 mmol/L (ref 135–145)
Total Bilirubin: 0.3 mg/dL (ref 0.3–1.2)
Total Protein: 6.9 g/dL (ref 6.5–8.1)

## 2020-12-12 LAB — CBC WITH DIFFERENTIAL/PLATELET
Abs Immature Granulocytes: 0.03 10*3/uL (ref 0.00–0.07)
Basophils Absolute: 0 10*3/uL (ref 0.0–0.1)
Basophils Relative: 0 %
Eosinophils Absolute: 0.1 10*3/uL (ref 0.0–0.5)
Eosinophils Relative: 1 %
HCT: 28.5 % — ABNORMAL LOW (ref 36.0–46.0)
Hemoglobin: 9.4 g/dL — ABNORMAL LOW (ref 12.0–15.0)
Immature Granulocytes: 1 %
Lymphocytes Relative: 15 %
Lymphs Abs: 0.9 10*3/uL (ref 0.7–4.0)
MCH: 34.3 pg — ABNORMAL HIGH (ref 26.0–34.0)
MCHC: 33 g/dL (ref 30.0–36.0)
MCV: 104 fL — ABNORMAL HIGH (ref 80.0–100.0)
Monocytes Absolute: 0.7 10*3/uL (ref 0.1–1.0)
Monocytes Relative: 12 %
Neutro Abs: 4.3 10*3/uL (ref 1.7–7.7)
Neutrophils Relative %: 71 %
Platelets: 163 10*3/uL (ref 150–400)
RBC: 2.74 MIL/uL — ABNORMAL LOW (ref 3.87–5.11)
RDW: 16.6 % — ABNORMAL HIGH (ref 11.5–15.5)
WBC: 6 10*3/uL (ref 4.0–10.5)
nRBC: 0 % (ref 0.0–0.2)

## 2020-12-12 MED ORDER — SODIUM CHLORIDE 0.9 % IV SOLN
2400.0000 mg/m2 | INTRAVENOUS | Status: DC
  Administered 2020-12-12: 3600 mg via INTRAVENOUS
  Filled 2020-12-12: qty 72

## 2020-12-12 MED ORDER — OLANZAPINE 10 MG PO TABS: 10.0000 mg | ORAL_TABLET | Freq: Every day | ORAL | 0 refills | Status: DC

## 2020-12-12 MED ORDER — SODIUM CHLORIDE 0.9 % IV SOLN
150.0000 mg | Freq: Once | INTRAVENOUS | Status: AC
  Administered 2020-12-12: 150 mg via INTRAVENOUS
  Filled 2020-12-12: qty 150

## 2020-12-12 MED ORDER — SODIUM CHLORIDE 0.9 % IV SOLN
150.0000 mg/m2 | Freq: Once | INTRAVENOUS | Status: AC
  Administered 2020-12-12: 220 mg via INTRAVENOUS
  Filled 2020-12-12: qty 10

## 2020-12-12 MED ORDER — ATROPINE SULFATE 1 MG/ML IV SOLN
0.5000 mg | Freq: Once | INTRAVENOUS | Status: AC | PRN
  Administered 2020-12-12: 0.5 mg via INTRAVENOUS
  Filled 2020-12-12: qty 1

## 2020-12-12 MED ORDER — OXALIPLATIN CHEMO INJECTION 100 MG/20ML
65.0000 mg/m2 | Freq: Once | INTRAVENOUS | Status: AC
  Administered 2020-12-12: 100 mg via INTRAVENOUS
  Filled 2020-12-12: qty 20

## 2020-12-12 MED ORDER — SODIUM CHLORIDE 0.9 % IV SOLN
600.0000 mg | Freq: Once | INTRAVENOUS | Status: AC
  Administered 2020-12-12: 600 mg via INTRAVENOUS
  Filled 2020-12-12: qty 30

## 2020-12-12 MED ORDER — DEXTROSE 5 % IV SOLN
Freq: Once | INTRAVENOUS | Status: AC
  Filled 2020-12-12: qty 250

## 2020-12-12 MED ORDER — SODIUM CHLORIDE 0.9 % IV SOLN
10.0000 mg | Freq: Once | INTRAVENOUS | Status: AC
  Administered 2020-12-12: 10 mg via INTRAVENOUS
  Filled 2020-12-12: qty 10

## 2020-12-12 MED ORDER — PALONOSETRON HCL INJECTION 0.25 MG/5ML
0.2500 mg | Freq: Once | INTRAVENOUS | Status: AC
  Administered 2020-12-12: 0.25 mg via INTRAVENOUS
  Filled 2020-12-12: qty 5

## 2020-12-12 NOTE — Progress Notes (Signed)
Hematology/Oncology Consult note [facility]  [phone] [phone]  Patient Care Team: Cletis Athens, MD as PCP - General (Internal Medicine) Grant Fontana, Success as Referring Physician (Chiropractic Medicine) Christene Lye, MD (General Surgery) Sindy Guadeloupe, MD as Consulting Physician (Hematology and Oncology)   Name of the patient: Sheryl Perez  [id number]  [birth year]   Date of visit: 12/12/20  Diagnosis- stage I pancreatic cancer  Chief complaint/ Reason for visit-on treatment assessment prior to cycle 6 of neoadjuvant modified FOLFIRINOX chemotherapy  Heme/Onc history: patient is a 60 year old female with a history of irritable bowel disease for which she follows up with [facility].  She has undergone EGD and colonoscopy with them in the past.  More recently patient was complaining of left upper quadrant and epigastric abdominal pain which causes sudden episodes of abdominal spasms and was prescribed as needed tramadol for it.  These episodes of pain are unrelated to food and are dull aching or throbbing.  She has undergone cholecystectomy in the past.  She underwent CT abdomen and pelvis with contrast in the ER on 08/04/2020 Which did not reveal any acute pathology which showed a possible hypoechoic 1.9 cm right hepatic lobe lesion.  This was followed by an MRI which showed no discrete lesion in the liver and the area of concern noted on CT scan was more likely to represent variable fat deposition.  However there was loss of normal T1 signal in the peripheral pancreas and a potential small pancreatic lesion suspicious for early pancreatic adenocarcinoma in the neck of the pancreas.     Patient underwent EUS by Dr. Mont Dutton which showed an irregular mass in the pancreatic neck measuring 15 x 11 mm.  FNA showed adenocarcinoma.  Pancreatic duct measured 1.1 mm in the head with abrupt dilatation in the region of the mass to 4.1 mm.  No  abnormality noted in the common bile duct and hepatic duct.  Celiac region showed no significant endosonographic abnormality.  No lymphadenopathy was seen.  CA 19-9 mildly elevated at 35   Patient was seen by Dr. Hyman Hopes from pancreaticobiliary surgery at [facility] for surgical opinion.  Although patient has a small lesion involving the pancreatic neck, there was a concern for tumor extension towards the splenic vein and narrowing of portal splenic confluence.  Neoadjuvant chemotherapy for 3 months was therefore favored    Interval history-she has been feeling more fatigued with chemotherapy.  Also reports on and off nausea secondary to treatment and she has had to miss work intermittently because of that.  ECOG PS- 1 Pain scale- 0  Review of systems- Review of Systems  Constitutional:  Positive for malaise/fatigue. Negative for chills, fever and weight loss.  HENT:  Negative for congestion, ear discharge and nosebleeds.   Eyes:  Negative for blurred vision.  Respiratory:  Negative for cough, hemoptysis, sputum production, shortness of breath and wheezing.   Cardiovascular:  Negative for chest pain, palpitations, orthopnea and claudication.  Gastrointestinal:  Negative for abdominal pain, blood in stool, constipation, diarrhea, heartburn, melena, nausea and vomiting.  Genitourinary:  Negative for dysuria, flank pain, frequency, hematuria and urgency.  Musculoskeletal:  Negative for back pain, joint pain and myalgias.  Skin:  Negative for rash.  Neurological:  Negative for dizziness, tingling, focal weakness, seizures, weakness and headaches.  Endo/Heme/Allergies:  Does not bruise/bleed easily.  Psychiatric/Behavioral:  Negative for depression and suicidal ideas. The patient does not have insomnia.      Allergies  Allergen Reactions   Sulfacetamide Sodium Swelling    Diarrhea,dizziness, tongue swelling   Other Other (See Comments) and Swelling    PT states that anything with the sides  effects "may cause rash" she cannot take due to her psoriasis, it will make the patches on her hand so thick she can't use her hands.  Diarrhea,dizziness, tongue swelling   Oxycontin [Oxycodone Hcl] Nausea And Vomiting   Sulfa Antibiotics Swelling    Diarrhea,dizziness, tongue swelling   Abilify [Aripiprazole] Other (See Comments)   Cephalosporins Rash   Erythromycin Other (See Comments)    Abd. pain Abd. pain Other reaction(s): Other (See Comments) Abd. 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    Bipolar disorder (HCC)    Chest pain, unspecified    Chronic low back pain 10/23/2013   Chronic UTI    COPD (chronic obstructive pulmonary disease) (HCC)    Depression    Dysfunction of eustachian tube    Enlargement of lymph nodes    Family history of brain cancer    Family history of breast cancer    Family history of prostate cancer    History of Bell's palsy Right   Injury, other and unspecified, knee, leg, ankle, and foot    MRSA (methicillin resistant staph aureus) culture positive 07/11/2014   abscess   Other specified disease of hair and hair follicles    Pancreatic cancer ([place])    Pancreatic cancer ([place])    Rectal prolapse    Unspecified symptom associated with female genital organs      Past Surgical History:  Procedure Laterality Date   ABDOMINAL HYSTERECTOMY     bladder tack  2010   CHOLECYSTECTOMY  2002   FOOT SURGERY Bilateral 2004   bunion   GANGLION CYST EXCISION  2002   INCISION AND DRAINAGE ABSCESS  04-16-15   PARTIAL HYSTERECTOMY  2010   uterus removed   PORTA CATH INSERTION N/A 09/11/2020   Procedure: PORTA CATH INSERTION;  Surgeon: Algernon Huxley, MD;  Location: [facility];  Service: Cardiovascular;  Laterality: N/A;     Social History   Socioeconomic History   Marital status: Married    Spouse name: Not on file   Number of children: 3   Years of education: hs   Highest education level: Some college, no degree  Occupational History   Not on file  Tobacco Use   Smoking status: Former    Packs/day: 0.50    Years: 40.00    Pack years: 20.00    Types: Cigarettes    Quit date: 07/14/2020    Years since quitting: 0.4   Smokeless tobacco: Never  Vaping Use   Vaping Use: Never used  Substance and Sexual Activity   Alcohol use: No    Alcohol/week: 1.0 standard drink    Types: 1 Glasses of wine per week    Comment: occasional   Drug use: Yes    Types: Marijuana    Comment: last used 05-20-15   Sexual activity: Yes    Birth control/protection: None  Other Topics Concern   Not on file  Social History Narrative   Patient lives at home with her husband [name]).   Patient works full time.  Education CNA    Right handed.   Caffeine Tea , Soda.   Social Determinants of Health   Financial Resource Strain: Not on file  Food Insecurity: Not on file  Transportation Needs: Not on file  Physical Activity: Not on file  Stress: Not on file  Social Connections: Not on file  Intimate Partner Violence: Not on file    Family History  Problem Relation Age of Onset   Diabetes Father    Cancer Father        brain tumor   Depression Father    Depression Sister    Alcohol abuse Brother    Depression Brother    Leukemia Maternal Aunt    Prostate cancer Maternal Uncle    Breast cancer Paternal Aunt        dx >50   Heart disease Maternal Grandmother    Heart attack Maternal Grandmother    Stroke Maternal Grandmother    Parkinson's disease Maternal Grandfather    Leukemia Paternal Grandmother    Diabetes Paternal Grandfather    Breast cancer Cousin    Brain cancer Niece        dx under 58     Current Outpatient Medications:    Ascorbic Acid (VITAMIN C) 1000 MG tablet, Take 1,000 mg by  mouth daily., Disp: , Rfl:    dexamethasone (DECADRON) 4 MG tablet, Take 2 tablets (8 mg total) by mouth daily. Start the day after chemotherapy for 3 days. Take with food., Disp: 8 tablet, Rfl: 5   divalproex (DEPAKOTE) 250 MG DR tablet, Take 1 tablet (250 mg total) by mouth 2 (two) times daily., Disp: 180 tablet, Rfl: 1   famotidine (PEPCID) 20 MG tablet, Take 20 mg by mouth daily., Disp: , Rfl:    ferrous sulfate 325 (65 FE) MG tablet, Take 325 mg by mouth 2 (two) times daily with a meal., Disp: , Rfl:    fluticasone (FLONASE) 50 MCG/ACT nasal spray, Place 1 spray into both nostrils daily. (Patient taking differently: Place 1 spray into both nostrils daily as needed.), Disp: 11.1 mL, Rfl: 2   KLOR-CON M20 20 MEQ tablet, TAKE 1 TABLET BY MOUTH EVERY DAY, Disp: 21 tablet, Rfl: 0   loperamide (IMODIUM A-D) 2 MG tablet, Take 2 at onset of diarrhea, then 1 every 2hrs until 12hr without a BM. May take 2 tab every 4hrs at bedtime. If diarrhea recurs repeat., Disp: 100 tablet, Rfl: 1   Multiple Vitamin (MULTIVITAMIN) tablet, Take 1 tablet by mouth daily., Disp: , Rfl:    OLANZapine (ZYPREXA) 10 MG tablet, Take 1 tablet (10 mg total) by mouth at bedtime., Disp: 30 tablet, Rfl: 0   propranolol (INDERAL) 20 MG tablet, Take 1 tablet (20 mg total) by mouth 3 (three) times daily., Disp: 270 tablet, Rfl: 1   ALPRAZolam (XANAX) 0.5 MG tablet, Take 1 tablet (0.5 mg total) by mouth 2 (two) times daily as needed for anxiety. (Patient not taking: No sig reported), Disp: 20 tablet, Rfl: 1   loratadine (CLARITIN) 10 MG tablet, TAKE 1 TABLET BY MOUTH EVERY DAY (Patient not taking: Reported on 12/12/2020), Disp: 90 tablet, Rfl: 1   naproxen sodium (ALEVE) 220 MG tablet, Take 220 mg by mouth daily as needed. (Patient not taking: No sig reported), Disp: , Rfl:    ondansetron (ZOFRAN) 8 MG tablet, Take 1 tablet (8 mg total) by mouth 2 (two) times daily as needed. Start on day 3 after chemotherapy. (Patient not taking: Reported  on 12/12/2020),  Disp: 30 tablet, Rfl: 1   prochlorperazine (COMPAZINE) 10 MG tablet, Take 1 tablet (10 mg total) by mouth every 6 (six) hours as needed (Nausea or vomiting). (Patient not taking: Reported on 12/12/2020), Disp: 30 tablet, Rfl: 1   QUEtiapine (SEROQUEL) 50 MG tablet, Take 1 tablet (50 mg total) by mouth at bedtime as needed. (Patient not taking: Reported on 12/12/2020), Disp: 90 tablet, Rfl: 0 No current facility-administered medications for this visit.  Facility-Administered Medications Ordered in Other Visits:    atropine injection 0.5 mg, 0.5 mg, Intravenous, Once PRN, Sindy Guadeloupe, MD   fluorouracil (ADRUCIL) 3,600 mg in sodium chloride 0.9 % 78 mL chemo infusion, 2,400 mg/m2 (Treatment Plan Recorded), Intravenous, 1 day or 1 dose, Sindy Guadeloupe, MD   heparin lock flush 100 UNIT/ML injection, , , ,    irinotecan (CAMPTOSAR) 220 mg in sodium chloride 0.9 % 500 mL chemo infusion, 150 mg/m2 (Treatment Plan Recorded), Intravenous, Once, Sindy Guadeloupe, MD   leucovorin 600 mg in sodium chloride 0.9 % 250 mL infusion, 600 mg, Intravenous, Once, Sindy Guadeloupe, MD   oxaliplatin (ELOXATIN) 100 mg in dextrose 5 % 500 mL chemo infusion, 65 mg/m2 (Treatment Plan Recorded), Intravenous, Once, Sindy Guadeloupe, MD, Last Rate: 260 mL/hr at 12/12/20 1157, 100 mg at 12/12/20 1157   sodium chloride flush (NS) 0.9 % injection 10 mL, 10 mL, Intracatheter, PRN, Sindy Guadeloupe, MD, 10 mL at 11/03/20 1441  Physical exam:  Vitals:   12/12/20 1006  BP: (!) 118/51  Pulse: 60  Resp: 16  Temp: (!) 97.5 F (36.4 C)  SpO2: 100%  Weight: 115 lb 14.4 oz (52.6 kg)   Physical Exam Constitutional:      General: She is not in acute distress.    Comments: Appears fatigued  Cardiovascular:     Rate and Rhythm: Normal rate and regular rhythm.     Heart sounds: Normal heart sounds.  Pulmonary:     Effort: Pulmonary effort is normal.     Breath sounds: Normal breath sounds.  Abdominal:     General: Bowel  sounds are normal.     Palpations: Abdomen is soft.  Skin:    General: Skin is warm and dry.  Neurological:     Mental Status: She is alert and oriented to person, place, and time.     CMP Latest Ref Rng & Units 12/12/2020  Glucose 70 - 99 mg/dL 121(H)  BUN 6 - 20 mg/dL 9  Creatinine 0.44 - 1.00 mg/dL 0.81  Sodium 135 - 145 mmol/L 139  Potassium 3.5 - 5.1 mmol/L 4.0  Chloride 98 - 111 mmol/L 106  CO2 22 - 32 mmol/L 27  Calcium 8.9 - 10.3 mg/dL 8.7(L)  Total Protein 6.5 - 8.1 g/dL 6.9  Total Bilirubin 0.3 - 1.2 mg/dL 0.3  Alkaline Phos 38 - 126 U/L 89  AST 15 - 41 U/L 30  ALT 0 - 44 U/L 19   CBC Latest Ref Rng & Units 12/12/2020  WBC 4.0 - 10.5 K/uL 6.0  Hemoglobin 12.0 - 15.0 g/dL 9.4(L)  Hematocrit 36.0 - 46.0 % 28.5(L)  Platelets 150 - 400 K/uL 163    Assessment and plan- Patient is a 60 y.o. female with stage I pancreatic neck adenocarcinoma cT1 cN0 M0 borderline resectable.  She is here for on treatment assessment prior to cycle 6 of neoadjuvant modified FOLFIRINOX chemotherapy  Counts okay to proceed with cycle 6 of neoadjuvant modified FOLFIRINOX chemotherapy today.  She  has gradually developed chemo induced anemia.  Her baseline hemoglobin prior to starting chemotherapy was close to 12 but now presently 9.4.  Iron studies B12 and folate were all normal Indicative of anemia secondary to chemotherapy rather than a reversible cause.  Patient has an appointment coming up with Dr. Hyman Hopes with repeat scans in 2 weeks.  Cancer center was close to 3 weeks from now.  She wants to keep her chemotherapy on Fridays only and therefore her next treatment will be in 4 weeks if the plan is to continue 6 months of neoadjuvant chemotherapy.  I am concerned that patient is getting more anemic as well as fatigue from ongoing chemotherapy and may not be able to go through 6 months of chemotherapy prior to surgery.  Chemo induced nausea: We will add olanzapine to her regimen   Visit  Diagnosis 1. Pancreatic adenocarcinoma ([place])   2. Encounter for antineoplastic chemotherapy   3. Antineoplastic chemotherapy induced anemia   4. Chemotherapy-induced nausea      Dr. Randa Evens, MD, MPH [facility] [phone] 12/12/2020 1:08 PM

## 2020-12-12 NOTE — Patient Instructions (Signed)
[facility]  Discharge Instructions: Thank you for choosing [facility] to provide your oncology and hematology care.  If you have a lab appointment with the [facility], please go directly to the [facility] and check in at the registration area.  Wear comfortable clothing and clothing appropriate for easy access to any Portacath or PICC line.   We strive to give you quality time with your provider. You may need to reschedule your appointment if you arrive late (15 or more minutes).  Arriving late affects you and other patients whose appointments are after yours.  Also, if you miss three or more appointments without notifying the office, you may be dismissed from the clinic at the provider's discretion.      For prescription refill requests, have your pharmacy contact our office and allow 72 hours for refills to be completed.    Today you received the following chemotherapy and/or immunotherapy agents OXALIPLATIN, LEUCOVORIN, IRINOTECAN, 5FU      To help prevent nausea and vomiting after your treatment, we encourage you to take your nausea medication as directed.  BELOW ARE SYMPTOMS THAT SHOULD BE REPORTED IMMEDIATELY: *FEVER GREATER THAN 100.4 F (38 C) OR HIGHER *CHILLS OR SWEATING *NAUSEA AND VOMITING THAT IS NOT CONTROLLED WITH YOUR NAUSEA MEDICATION *UNUSUAL SHORTNESS OF BREATH *UNUSUAL BRUISING OR BLEEDING *URINARY PROBLEMS (pain or burning when urinating, or frequent urination) *BOWEL PROBLEMS (unusual diarrhea, constipation, pain near the anus) TENDERNESS IN MOUTH AND THROAT WITH OR WITHOUT PRESENCE OF ULCERS (sore throat, sores in mouth, or a toothache) UNUSUAL RASH, SWELLING OR PAIN  UNUSUAL VAGINAL DISCHARGE OR ITCHING   Items with * indicate a potential emergency and should be followed up as soon as possible or go to the Emergency Department if any problems should occur.  Please show the CHEMOTHERAPY ALERT CARD or  IMMUNOTHERAPY ALERT CARD at check-in to the Emergency Department and triage nurse.  Should you have questions after your visit or need to cancel or reschedule your appointment, please contact [facility]  [phone] and follow the prompts.  Office hours are 8:00 a.m. to 4:30 p.m. Monday - Friday. Please note that voicemails left after 4:00 p.m. may not be returned until the following business day.  We are closed weekends and major holidays. You have access to a nurse at all times for urgent questions. Please call the main number to the clinic [phone] and follow the prompts.  For any non-urgent questions, you may also contact your provider using MyChart. We now offer e-Visits for anyone 74 and older to request care online for non-urgent symptoms. For details visit [email or web address].   Also download the MyChart app! Go to the app store, search "MyChart", open the app, select [facility], and log in with your MyChart username and password.  Due to Covid, a mask is required upon entering the hospital/clinic. If you do not have a mask, one will be given to you upon arrival. For doctor visits, patients may have 1 support person aged 68 or older with them. For treatment visits, patients cannot have anyone with them due to current Covid guidelines and our immunocompromised population.   Oxaliplatin Injection What is this medication? OXALIPLATIN (ox AL i PLA tin) is a chemotherapy drug. It targets fast dividing cells, like cancer cells, and causes these cells to die. This medicine is used to treat cancers of the colon and rectum, and many other cancers. This medicine may be used for  other purposes; ask your health care provider or pharmacist if you have questions. COMMON BRAND NAME(S): Eloxatin What should I tell my care team before I take this medication? They need to know if you have any of these conditions: heart disease history of irregular  heartbeat liver disease low blood counts, like white cells, platelets, or red blood cells lung or breathing disease, like asthma take medicines that treat or prevent blood clots tingling of the fingers or toes, or other nerve disorder an unusual or allergic reaction to oxaliplatin, other chemotherapy, other medicines, foods, dyes, or preservatives pregnant or trying to get pregnant breast-feeding How should I use this medication? This drug is given as an infusion into a vein. It is administered in a hospital or clinic by a specially trained health care professional. Talk to your pediatrician regarding the use of this medicine in children. Special care may be needed. Overdosage: If you think you have taken too much of this medicine contact a poison control center or emergency room at once. NOTE: This medicine is only for you. Do not share this medicine with others. What if I miss a dose? It is important not to miss a dose. Call your doctor or health care professional if you are unable to keep an appointment. What may interact with this medication? Do not take this medicine with any of the following medications: cisapride dronedarone pimozide thioridazine This medicine may also interact with the following medications: aspirin and aspirin-like medicines certain medicines that treat or prevent blood clots like warfarin, apixaban, dabigatran, and rivaroxaban cisplatin cyclosporine diuretics medicines for infection like acyclovir, adefovir, amphotericin B, bacitracin, cidofovir, foscarnet, ganciclovir, gentamicin, pentamidine, vancomycin NSAIDs, medicines for pain and inflammation, like ibuprofen or naproxen other medicines that prolong the QT interval (an abnormal heart rhythm) pamidronate zoledronic acid This list may not describe all possible interactions. Give your health care provider a list of all the medicines, herbs, non-prescription drugs, or dietary supplements you use. Also tell  them if you smoke, drink alcohol, or use illegal drugs. Some items may interact with your medicine. What should I watch for while using this medication? Your condition will be monitored carefully while you are receiving this medicine. You may need blood work done while you are taking this medicine. This medicine may make you feel generally unwell. This is not uncommon as chemotherapy can affect healthy cells as well as cancer cells. Report any side effects. Continue your course of treatment even though you feel ill unless your healthcare professional tells you to stop. This medicine can make you more sensitive to cold. Do not drink cold drinks or use ice. Cover exposed skin before coming in contact with cold temperatures or cold objects. When out in cold weather wear warm clothing and cover your mouth and nose to warm the air that goes into your lungs. Tell your doctor if you get sensitive to the cold. Do not become pregnant while taking this medicine or for 9 months after stopping it. Women should inform their health care professional if they wish to become pregnant or think they might be pregnant. Men should not father a child while taking this medicine and for 6 months after stopping it. There is potential for serious side effects to an unborn child. Talk to your health care professional for more information. Do not breast-feed a child while taking this medicine or for 3 months after stopping it. This medicine has caused ovarian failure in some women. This medicine may make it more difficult  to get pregnant. Talk to your health care professional if you are concerned about your fertility. This medicine has caused decreased sperm counts in some men. This may make it more difficult to father a child. Talk to your health care professional if you are concerned about your fertility. This medicine may increase your risk of getting an infection. Call your health care professional for advice if you get a fever,  chills, or sore throat, or other symptoms of a cold or flu. Do not treat yourself. Try to avoid being around people who are sick. Avoid taking medicines that contain aspirin, acetaminophen, ibuprofen, naproxen, or ketoprofen unless instructed by your health care professional. These medicines may hide a fever. Be careful brushing or flossing your teeth or using a toothpick because you may get an infection or bleed more easily. If you have any dental work done, tell your dentist you are receiving this medicine. What side effects may I notice from receiving this medication? Side effects that you should report to your doctor or health care professional as soon as possible: allergic reactions like skin rash, itching or hives, swelling of the face, lips, or tongue breathing problems cough low blood counts - this medicine may decrease the number of white blood cells, red blood cells, and platelets. You may be at increased risk for infections and bleeding nausea, vomiting pain, redness, or irritation at site where injected pain, tingling, numbness in the hands or feet signs and symptoms of bleeding such as bloody or black, tarry stools; red or dark brown urine; spitting up blood or brown material that looks like coffee grounds; red spots on the skin; unusual bruising or bleeding from the eyes, gums, or nose signs and symptoms of a dangerous change in heartbeat or heart rhythm like chest pain; dizziness; fast, irregular heartbeat; palpitations; feeling faint or lightheaded; falls signs and symptoms of infection like fever; chills; cough; sore throat; pain or trouble passing urine signs and symptoms of liver injury like dark yellow or brown urine; general ill feeling or flu-like symptoms; light-colored stools; loss of appetite; nausea; right upper belly pain; unusually weak or tired; yellowing of the eyes or skin signs and symptoms of low red blood cells or anemia such as unusually weak or tired; feeling faint  or lightheaded; falls signs and symptoms of muscle injury like dark urine; trouble passing urine or change in the amount of urine; unusually weak or tired; muscle pain; back pain Side effects that usually do not require medical attention (report to your doctor or health care professional if they continue or are bothersome): changes in taste diarrhea gas hair loss loss of appetite mouth sores This list may not describe all possible side effects. Call your doctor for medical advice about side effects. You may report side effects to FDA at 1-800-FDA-1088. Where should I keep my medication? This drug is given in a hospital or clinic and will not be stored at home. NOTE: This sheet is a summary. It may not cover all possible information. If you have questions about this medicine, talk to your doctor, pharmacist, or health care provider.  2022 Elsevier/Gold Standard (2020-10-14 00:00:00)  Leucovorin injection What is this medication? LEUCOVORIN (loo koe VOR in) is used to prevent or treat the harmful effects of some medicines. This medicine is used to treat anemia caused by a low amount of folic acid in the body. It is also used with 5-fluorouracil (5-FU) to treat colon cancer. This medicine may be used for other purposes;  ask your health care provider or pharmacist if you have questions. What should I tell my care team before I take this medication? They need to know if you have any of these conditions: anemia from low levels of vitamin B-12 in the blood an unusual or allergic reaction to leucovorin, folic acid, other medicines, foods, dyes, or preservatives pregnant or trying to get pregnant breast-feeding How should I use this medication? This medicine is for injection into a muscle or into a vein. It is given by a health care professional in a hospital or clinic setting. Talk to your pediatrician regarding the use of this medicine in children. Special care may be needed. Overdosage: If you  think you have taken too much of this medicine contact a poison control center or emergency room at once. NOTE: This medicine is only for you. Do not share this medicine with others. What if I miss a dose? This does not apply. What may interact with this medication? capecitabine fluorouracil phenobarbital phenytoin primidone trimethoprim-sulfamethoxazole This list may not describe all possible interactions. Give your health care provider a list of all the medicines, herbs, non-prescription drugs, or dietary supplements you use. Also tell them if you smoke, drink alcohol, or use illegal drugs. Some items may interact with your medicine. What should I watch for while using this medication? Your condition will be monitored carefully while you are receiving this medicine. This medicine may increase the side effects of 5-fluorouracil, 5-FU. Tell your doctor or health care professional if you have diarrhea or mouth sores that do not get better or that get worse. What side effects may I notice from receiving this medication? Side effects that you should report to your doctor or health care professional as soon as possible: allergic reactions like skin rash, itching or hives, swelling of the face, lips, or tongue breathing problems fever, infection mouth sores unusual bleeding or bruising unusually weak or tired Side effects that usually do not require medical attention (report to your doctor or health care professional if they continue or are bothersome): constipation or diarrhea loss of appetite nausea, vomiting This list may not describe all possible side effects. Call your doctor for medical advice about side effects. You may report side effects to FDA at 1-800-FDA-1088. Where should I keep my medication? This drug is given in a hospital or clinic and will not be stored at home. NOTE: This sheet is a summary. It may not cover all possible information. If you have questions about this  medicine, talk to your doctor, pharmacist, or health care provider.  2022 Elsevier/Gold Standard (2007-08-03 00:00:00)  Irinotecan injection What is this medication? IRINOTECAN (ir in oh TEE kan ) is a chemotherapy drug. It is used to treat colon and rectal cancer. This medicine may be used for other purposes; ask your health care provider or pharmacist if you have questions. COMMON BRAND NAME(S): Camptosar What should I tell my care team before I take this medication? They need to know if you have any of these conditions: dehydration diarrhea infection (especially a virus infection such as chickenpox, cold sores, or herpes) liver disease low blood counts, like low white cell, platelet, or red cell counts low levels of calcium, magnesium, or potassium in the blood recent or ongoing radiation therapy an unusual or allergic reaction to irinotecan, other medicines, foods, dyes, or preservatives pregnant or trying to get pregnant breast-feeding How should I use this medication? This drug is given as an infusion into a vein. It is  administered in a hospital or clinic by a specially trained health care professional. Talk to your pediatrician regarding the use of this medicine in children. Special care may be needed. Overdosage: If you think you have taken too much of this medicine contact a poison control center or emergency room at once. NOTE: This medicine is only for you. Do not share this medicine with others. What if I miss a dose? It is important not to miss your dose. Call your doctor or health care professional if you are unable to keep an appointment. What may interact with this medication? Do not take this medicine with any of the following medications: cobicistat itraconazole This medicine may interact with the following medications: antiviral medicines for HIV or AIDS certain antibiotics like rifampin or rifabutin certain medicines for fungal infections like ketoconazole,  posaconazole, and voriconazole certain medicines for seizures like carbamazepine, phenobarbital, phenotoin clarithromycin gemfibrozil nefazodone St. John's Wort This list may not describe all possible interactions. Give your health care provider a list of all the medicines, herbs, non-prescription drugs, or dietary supplements you use. Also tell them if you smoke, drink alcohol, or use illegal drugs. Some items may interact with your medicine. What should I watch for while using this medication? Your condition will be monitored carefully while you are receiving this medicine. You will need important blood work done while you are taking this medicine. This drug may make you feel generally unwell. This is not uncommon, as chemotherapy can affect healthy cells as well as cancer cells. Report any side effects. Continue your course of treatment even though you feel ill unless your doctor tells you to stop. In some cases, you may be given additional medicines to help with side effects. Follow all directions for their use. You may get drowsy or dizzy. Do not drive, use machinery, or do anything that needs mental alertness until you know how this medicine affects you. Do not stand or sit up quickly, especially if you are an older patient. This reduces the risk of dizzy or fainting spells. Call your health care professional for advice if you get a fever, chills, or sore throat, or other symptoms of a cold or flu. Do not treat yourself. This medicine decreases your body's ability to fight infections. Try to avoid being around people who are sick. Avoid taking products that contain aspirin, acetaminophen, ibuprofen, naproxen, or ketoprofen unless instructed by your doctor. These medicines may hide a fever. This medicine may increase your risk to bruise or bleed. Call your doctor or health care professional if you notice any unusual bleeding. Be careful brushing and flossing your teeth or using a toothpick  because you may get an infection or bleed more easily. If you have any dental work done, tell your dentist you are receiving this medicine. Do not become pregnant while taking this medicine or for 6 months after stopping it. Women should inform their health care professional if they wish to become pregnant or think they might be pregnant. Men should not father a child while taking this medicine and for 3 months after stopping it. There is potential for serious side effects to an unborn child. Talk to your health care professional for more information. Do not breast-feed an infant while taking this medicine or for 7 days after stopping it. This medicine has caused ovarian failure in some women. This medicine may make it more difficult to get pregnant. Talk to your health care professional if you are concerned about your fertility. This  medicine has caused decreased sperm counts in some men. This may make it more difficult to father a child. Talk to your health care professional if you are concerned about your fertility. What side effects may I notice from receiving this medication? Side effects that you should report to your doctor or health care professional as soon as possible: allergic reactions like skin rash, itching or hives, swelling of the face, lips, or tongue chest pain diarrhea flushing, runny nose, sweating during infusion low blood counts - this medicine may decrease the number of white blood cells, red blood cells and platelets. You may be at increased risk for infections and bleeding. nausea, vomiting pain, swelling, warmth in the leg signs of decreased platelets or bleeding - bruising, pinpoint red spots on the skin, black, tarry stools, blood in the urine signs of infection - fever or chills, cough, sore throat, pain or difficulty passing urine signs of decreased red blood cells - unusually weak or tired, fainting spells, lightheadedness Side effects that usually do not require  medical attention (report to your doctor or health care professional if they continue or are bothersome): constipation hair loss headache loss of appetite mouth sores stomach pain This list may not describe all possible side effects. Call your doctor for medical advice about side effects. You may report side effects to FDA at 1-800-FDA-1088. Where should I keep my medication? This drug is given in a hospital or clinic and will not be stored at home. NOTE: This sheet is a summary. It may not cover all possible information. If you have questions about this medicine, talk to your doctor, pharmacist, or health care provider.  2022 Elsevier/Gold Standard (2020-10-14 00:00:00)   Fluorouracil, 5-FU injection What is this medication? FLUOROURACIL, 5-FU (flure oh YOOR a sil) is a chemotherapy drug. It slows the growth of cancer cells. This medicine is used to treat many types of cancer like breast cancer, colon or rectal cancer, pancreatic cancer, and stomach cancer. This medicine may be used for other purposes; ask your health care provider or pharmacist if you have questions. COMMON BRAND NAME(S): Adrucil What should I tell my care team before I take this medication? They need to know if you have any of these conditions: blood disorders dihydropyrimidine dehydrogenase (DPD) deficiency infection (especially a virus infection such as chickenpox, cold sores, or herpes) kidney disease liver disease malnourished, poor nutrition recent or ongoing radiation therapy an unusual or allergic reaction to fluorouracil, other chemotherapy, other medicines, foods, dyes, or preservatives pregnant or trying to get pregnant breast-feeding How should I use this medication? This drug is given as an infusion or injection into a vein. It is administered in a hospital or clinic by a specially trained health care professional. Talk to your pediatrician regarding the use of this medicine in children. Special care  may be needed. Overdosage: If you think you have taken too much of this medicine contact a poison control center or emergency room at once. NOTE: This medicine is only for you. Do not share this medicine with others. What if I miss a dose? It is important not to miss your dose. Call your doctor or health care professional if you are unable to keep an appointment. What may interact with this medication? Do not take this medicine with any of the following medications: live virus vaccines This medicine may also interact with the following medications: medicines that treat or prevent blood clots like warfarin, enoxaparin, and dalteparin This list may not describe all possible  interactions. Give your health care provider a list of all the medicines, herbs, non-prescription drugs, or dietary supplements you use. Also tell them if you smoke, drink alcohol, or use illegal drugs. Some items may interact with your medicine. What should I watch for while using this medication? Visit your doctor for checks on your progress. This drug may make you feel generally unwell. This is not uncommon, as chemotherapy can affect healthy cells as well as cancer cells. Report any side effects. Continue your course of treatment even though you feel ill unless your doctor tells you to stop. In some cases, you may be given additional medicines to help with side effects. Follow all directions for their use. Call your doctor or health care professional for advice if you get a fever, chills or sore throat, or other symptoms of a cold or flu. Do not treat yourself. This drug decreases your body's ability to fight infections. Try to avoid being around people who are sick. This medicine may increase your risk to bruise or bleed. Call your doctor or health care professional if you notice any unusual bleeding. Be careful brushing and flossing your teeth or using a toothpick because you may get an infection or bleed more easily. If you  have any dental work done, tell your dentist you are receiving this medicine. Avoid taking products that contain aspirin, acetaminophen, ibuprofen, naproxen, or ketoprofen unless instructed by your doctor. These medicines may hide a fever. Do not become pregnant while taking this medicine. Women should inform their doctor if they wish to become pregnant or think they might be pregnant. There is a potential for serious side effects to an unborn child. Talk to your health care professional or pharmacist for more information. Do not breast-feed an infant while taking this medicine. Men should inform their doctor if they wish to father a child. This medicine may lower sperm counts. Do not treat diarrhea with over the counter products. Contact your doctor if you have diarrhea that lasts more than 2 days or if it is severe and watery. This medicine can make you more sensitive to the sun. Keep out of the sun. If you cannot avoid being in the sun, wear protective clothing and use sunscreen. Do not use sun lamps or tanning beds/booths. What side effects may I notice from receiving this medication? Side effects that you should report to your doctor or health care professional as soon as possible: allergic reactions like skin rash, itching or hives, swelling of the face, lips, or tongue low blood counts - this medicine may decrease the number of white blood cells, red blood cells and platelets. You may be at increased risk for infections and bleeding. signs of infection - fever or chills, cough, sore throat, pain or difficulty passing urine signs of decreased platelets or bleeding - bruising, pinpoint red spots on the skin, black, tarry stools, blood in the urine signs of decreased red blood cells - unusually weak or tired, fainting spells, lightheadedness breathing problems changes in vision chest pain mouth sores nausea and vomiting pain, swelling, redness at site where injected pain, tingling, numbness in  the hands or feet redness, swelling, or sores on hands or feet stomach pain unusual bleeding Side effects that usually do not require medical attention (report to your doctor or health care professional if they continue or are bothersome): changes in finger or toe nails diarrhea dry or itchy skin hair loss headache loss of appetite sensitivity of eyes to the light stomach upset  unusually teary eyes This list may not describe all possible side effects. Call your doctor for medical advice about side effects. You may report side effects to FDA at 1-800-FDA-1088. Where should I keep my medication? This drug is given in a hospital or clinic and will not be stored at home. NOTE: This sheet is a summary. It may not cover all possible information. If you have questions about this medicine, talk to your doctor, pharmacist, or health care provider.  2022 Elsevier/Gold Standard (2020-10-14 00:00:00)

## 2020-12-12 NOTE — Progress Notes (Signed)
Nutrition Follow-up:  Patient with stage I pancreatic cancer.  Patient receiving folfirinox.    Met with patient during infusion.  Says that she is meeting with [facility] surgeon on 11/18.  Appetite has been the same.  Eating cereal and milk for breakfast this time, getting tired of eggs.  Drinking 2 boost shakes a day.  Eating bedtime snack, usually cheesy puffs and soda or lava cake.  Taking imodium for diarrhea every other day and it is working per patient.      Medications: reviewed  Labs: reviewed  Anthropometrics:   Weight 115 lb 14.4 oz, stable  115 lb on 10/21 113 lb 14.4 oz on 10/7 113 lb 12.8 oz on 9/23 111 lb on 9/6 113 lb 4.8 oz on 8/19   NUTRITION DIAGNOSIS: Unintentional weight loss stable   INTERVENTION:  Continue boost shakes BID Discussed options for bedtime snack including protein food (peanut butter crackers, boost shake, glass of milk, hot chocolate made with milk)    MONITORING, EVALUATION, GOAL: weight trends, intake   NEXT VISIT: Friday, Dec 2nd during infusion  Denese Mentink B. Zenia Resides, [place], [place] Registered Dietitian [phone] (mobile)

## 2020-12-13 DIAGNOSIS — C259 Malignant neoplasm of pancreas, unspecified: Secondary | ICD-10-CM | POA: Diagnosis not present

## 2020-12-15 ENCOUNTER — Other Ambulatory Visit: Payer: Self-pay

## 2020-12-15 ENCOUNTER — Inpatient Hospital Stay: Payer: BC Managed Care – PPO

## 2020-12-15 VITALS — BP 126/58 | HR 72 | Temp 97.5°F | Resp 18

## 2020-12-15 DIAGNOSIS — R5383 Other fatigue: Secondary | ICD-10-CM | POA: Diagnosis not present

## 2020-12-15 DIAGNOSIS — Z808 Family history of malignant neoplasm of other organs or systems: Secondary | ICD-10-CM | POA: Diagnosis not present

## 2020-12-15 DIAGNOSIS — Z79899 Other long term (current) drug therapy: Secondary | ICD-10-CM | POA: Diagnosis not present

## 2020-12-15 DIAGNOSIS — K589 Irritable bowel syndrome without diarrhea: Secondary | ICD-10-CM | POA: Diagnosis not present

## 2020-12-15 DIAGNOSIS — C257 Malignant neoplasm of other parts of pancreas: Secondary | ICD-10-CM | POA: Diagnosis not present

## 2020-12-15 DIAGNOSIS — Z806 Family history of leukemia: Secondary | ICD-10-CM | POA: Diagnosis not present

## 2020-12-15 DIAGNOSIS — Z87891 Personal history of nicotine dependence: Secondary | ICD-10-CM | POA: Diagnosis not present

## 2020-12-15 DIAGNOSIS — F319 Bipolar disorder, unspecified: Secondary | ICD-10-CM | POA: Diagnosis not present

## 2020-12-15 DIAGNOSIS — Z818 Family history of other mental and behavioral disorders: Secondary | ICD-10-CM | POA: Diagnosis not present

## 2020-12-15 DIAGNOSIS — Z811 Family history of alcohol abuse and dependence: Secondary | ICD-10-CM | POA: Diagnosis not present

## 2020-12-15 DIAGNOSIS — Z8042 Family history of malignant neoplasm of prostate: Secondary | ICD-10-CM | POA: Diagnosis not present

## 2020-12-15 DIAGNOSIS — Z833 Family history of diabetes mellitus: Secondary | ICD-10-CM | POA: Diagnosis not present

## 2020-12-15 DIAGNOSIS — Z803 Family history of malignant neoplasm of breast: Secondary | ICD-10-CM | POA: Diagnosis not present

## 2020-12-15 DIAGNOSIS — D6481 Anemia due to antineoplastic chemotherapy: Secondary | ICD-10-CM | POA: Diagnosis not present

## 2020-12-15 DIAGNOSIS — T451X5A Adverse effect of antineoplastic and immunosuppressive drugs, initial encounter: Secondary | ICD-10-CM | POA: Diagnosis not present

## 2020-12-15 DIAGNOSIS — C259 Malignant neoplasm of pancreas, unspecified: Secondary | ICD-10-CM

## 2020-12-15 DIAGNOSIS — R110 Nausea: Secondary | ICD-10-CM | POA: Diagnosis not present

## 2020-12-15 MED ORDER — HEPARIN SOD (PORK) LOCK FLUSH 100 UNIT/ML IV SOLN
INTRAVENOUS | Status: AC
  Administered 2020-12-15: 500 [IU]
  Filled 2020-12-15: qty 5

## 2020-12-15 MED ORDER — SODIUM CHLORIDE 0.9% FLUSH
10.0000 mL | INTRAVENOUS | Status: DC | PRN
  Filled 2020-12-15: qty 10

## 2020-12-15 MED ORDER — HEPARIN SOD (PORK) LOCK FLUSH 100 UNIT/ML IV SOLN
500.0000 [IU] | Freq: Once | INTRAVENOUS | Status: AC | PRN
  Filled 2020-12-15: qty 5

## 2020-12-15 MED ORDER — PEGFILGRASTIM-CBQV 6 MG/0.6ML ~~LOC~~ SOSY
6.0000 mg | PREFILLED_SYRINGE | Freq: Once | SUBCUTANEOUS | Status: DC
  Filled 2020-12-15: qty 0.6

## 2020-12-15 NOTE — Progress Notes (Signed)
1559: No Udencya per Judeen Hammans RN per Dr. Janese Banks.

## 2020-12-16 ENCOUNTER — Other Ambulatory Visit: Payer: BC Managed Care – PPO

## 2020-12-16 NOTE — Op Note (Signed)
FULL OPERATIVE NOTE    Date Time: 12/18/20 8:05 AM  Patient Name: Jade Carey  Attending Physician: Janeal Holmes, MD      Date of Operation:   12/18/2020    Providers Performing:   Surgeon(s):  Elis Sauber, Franchot Mimes, MD    Circulator: Laverda Sorenson, RN  Scrub Person: Azalia Bilis, RN  First Assistant: Lerry Paterson  Preceptor: Asher Muir, RN    Operative Procedure:   Left wrist first dorsal compartment release    Preoperative Diagnosis:   Left wrist dequervain tenosynovitis    Postoperative Diagnosis:   Left wrist dequervain tenosynovitis    Indications:   60 year old right hand dominant female who presented with left DeQuervain's tenosynovitis. On exam, she is tender over the left first dorsal compartment. The patient has failed conservative management and 2 steroid injections for de Quervain tenosynovitis and is requesting surgical intervention. We discussed 1st dorsal compartment release with possible brachioradialis sling reconstruction if there is tendon subluxation with wrist motion intraoperatively. Risks of surgery include, but are not limited to, risk of anesthesia, bleeding, infection, damage to nerves (in particular the dorsal radial sensory nerve)/arteries/tendons, persistent or recurrent symptoms, need for further surgery, RSD, weakness, loss of motion, and the need for extensive post-operative therapy and immobilization. The patient understands the risks and wishes to proceed with left wrist 1st dorsal compartment release.  Informed consent was obtained from the patient.    Operative Notes:   The patient was identified in the preoperative holding area.  The left  wrist was marked as the correct surgical site with the patient's agreement. She was taken back to the operating room and moved onto the OR table with all bony prominences well padded.  Preoperative antibiotics were not given for a clean elective hand procedure.  Sedation was induced by the Anesthesia  service. 10 cc of 1% lidocaine and 0.5% Marcaine mix was injected proximal to the first dorsal compartment as a field block.  A forearm tourniquet was placed and the left  hand was prepped and draped in usual sterile fashion.  A time-out was performed confirming correct patient, surgical site, and procedure.  An Esmarch was applied to the arm and the tourniquet was inflated to 250 mmHg.  A longitudinal incision was made centered over the first dorsal compartment.  Sharp dissection was carried down through skin only and then tenotomies were used to dissect down to the first dorsal compartment.  Branches of the dorsal radial sensory nerve were identified and protected in the skin flaps throughout the case.  The first dorsal compartment was then opened sharply with a 15 blade on the dorsal aspect of the compartment. The retinaculum was severely thickened and compression of the tendons extended distally. The release was completed proximally and distally with the 15 blade.  There was a separate compartment for the APL tendon. There was mild tendon wear on EPB which was debrided. The wrist was then passively flexed and extended, and there was no subluxation of the tendons.  The wound was then copiously irrigated.  The incision was closed with 4-0 nylon horizontal mattress sutures.  The tourniquet was let down.  The incision was then dressed with bacitracin ointment, Adaptic, 4x4s, sterile Webril, and a volar splint.  The patient was then awoken from sedation and taken to recovery room in stable condition.      Estimated Blood Loss:   * No values recorded between 12/18/2020  7:29 AM and 12/18/2020  8:05  AM *    Implants:   * No implants in log *    Drains:   Drains: no    Specimens:   * No specimens in log *      Complications:   none    Signed by: Janeal Holmes, MD, MD

## 2020-12-16 NOTE — Discharge Instr - AVS First Page (Addendum)
POST OPERATIVE INSTRUCTIONS  Please follow the instructions listed below.  If you have questions or concerns regarding your surgery or these instructions, please call Dr. Pan.      DIET  Advance diet slowly, starting with liquids    ANESTHESIA  Rest is recommended for the first 24 hours. Some medications you may have received can take up to 24 hours to leave your system completely. For this reason, you should not ingest any alcoholic beverages, drive a car, operate machinery, or make any important decisions for 24 hours after your surgery. If you received a nerve block, you may notice effects for over 6 hours after surgery.    ACTIVITY  After the first 24 hours, you should use the operative hand to the extent that your dressing will allow. Make a fist 20 times an hour. This will help decrease swelling, especially in the fingers, and help to decrease stiffness after surgery.     PAIN CONTROL  There will be some discomfort or pain at the operative site. This can be lessened by the use of an ice pack, elevating your hand above the level of your heart, and by the use of pain medication as needed. Your hand will be sensitive to swelling so keep it elevated as much as possible for the first 3-5 days. If you are allowed to take anti-inflammatories and Tylenol, try alternating these medications after you run out of pain medication.    INCISION CARE  Following your surgery, you will have a splint on your arm or hand. Keep this dressing clean and dry until follow-up. Do not remove the splint. Avoid getting the dressing wet. Check the dressing for any drainage. If you notice a large amount of drainage or if you feel that the splint is too tight or loose, please contact the office.    WHEN TO CALL THE DOCTOR/OFFICE  Report any complications to the office immediately. This includes excessive bleeding, wound breakdown, signs of an infection, excessive pain, fever over 101 degrees, or an extremity that becomes cold, blue, or numb.  If you develop chest pain, difficulty breathing, or are unable to urinate after 6-8 hours, call your family doctor or visit the nearest emergency department.  If there are any questions of problems, feel free to contact the office at [phone].    Prescription refills will only be done during normal business hours. No narcotic refills will be called in after hours. After one refill, you are required to make an appointment to be evaluated before a second refill will be considered.     Once you are at home, if you experience any anesthesia complications such as numbness or tingling or prolong nausea/vomiting please call [phone] and ask to speak with the anesthesiologist on call.

## 2020-12-18 ENCOUNTER — Encounter: Payer: Self-pay | Admitting: Orthopaedic Surgery

## 2020-12-18 ENCOUNTER — Encounter
Admission: RE | Disposition: A | Discharge status: Home / Self Care | Payer: Self-pay | Source: Ambulatory Visit | Attending: Orthopaedic Surgery

## 2020-12-18 ENCOUNTER — Ambulatory Visit: Payer: No Typology Code available for payment source | Admitting: Internal Medicine

## 2020-12-18 ENCOUNTER — Ambulatory Visit
Admission: RE | Admit: 2020-12-18 | Discharge: 2020-12-18 | Disposition: A | Discharge status: Home / Self Care | Payer: No Typology Code available for payment source | Source: Ambulatory Visit | Attending: Orthopaedic Surgery | Admitting: Orthopaedic Surgery

## 2020-12-18 DIAGNOSIS — M654 Radial styloid tenosynovitis [de Quervain]: Secondary | ICD-10-CM | POA: Insufficient documentation

## 2020-12-18 HISTORY — PX: RELEASE, DEQUERVAIN'S CONTRACTURE: SHX5075

## 2020-12-18 SURGERY — RELEASE, DE QUERVAIN'S
Anesthesia: Anesthesia MAC / Sedation | Site: Wrist | Laterality: Left | Wound class: Clean

## 2020-12-18 MED ORDER — MIDAZOLAM HCL 1 MG/ML IJ SOLN (WRAP)
INTRAMUSCULAR | Status: DC | PRN
Start: 2020-12-18 — End: 2020-12-18
  Administered 2020-12-18: 2 mg via INTRAVENOUS

## 2020-12-18 MED ORDER — ONDANSETRON HCL 4 MG/2ML IJ SOLN
INTRAMUSCULAR | Status: DC | PRN
Start: 2020-12-18 — End: 2020-12-18
  Administered 2020-12-18: 4 mg via INTRAVENOUS

## 2020-12-18 MED ORDER — EPHEDRINE SULFATE 50 MG/ML IJ/IV SOLN (WRAP)
Status: AC
Start: 2020-12-18 — End: ?
  Filled 2020-12-18: qty 1

## 2020-12-18 MED ORDER — PROPOFOL 10 MG/ML IV EMUL (WRAP)
INTRAVENOUS | Status: AC
Start: 2020-12-18 — End: ?
  Filled 2020-12-18: qty 50

## 2020-12-18 MED ORDER — PROPOFOL 10 MG/ML IV EMUL (WRAP)
INTRAVENOUS | Status: AC
Start: 2020-12-18 — End: ?
  Filled 2020-12-18: qty 20

## 2020-12-18 MED ORDER — FENTANYL CITRATE (PF) 50 MCG/ML IJ SOLN (WRAP)
25.0000 ug | INTRAMUSCULAR | Status: DC | PRN
Start: 2020-12-18 — End: 2020-12-18

## 2020-12-18 MED ORDER — GLYCOPYRROLATE 0.2 MG/ML IJ SOLN (WRAP)
INTRAMUSCULAR | Status: DC | PRN
Start: 2020-12-18 — End: 2020-12-18
  Administered 2020-12-18: .2 mg via INTRAVENOUS

## 2020-12-18 MED ORDER — ACETAMINOPHEN 500 MG PO TABS
500.0000 mg | ORAL_TABLET | Freq: Once | ORAL | Status: DC | PRN
Start: 2020-12-18 — End: 2020-12-18

## 2020-12-18 MED ORDER — DEXAMETHASONE SODIUM PHOSPHATE 4 MG/ML IJ SOLN (WRAP)
INTRAMUSCULAR | Status: DC | PRN
Start: 2020-12-18 — End: 2020-12-18
  Administered 2020-12-18: 4 mg via INTRAVENOUS

## 2020-12-18 MED ORDER — GLYCOPYRROLATE 0.2 MG/ML IJ SOLN (WRAP)
INTRAMUSCULAR | Status: AC
Start: 2020-12-18 — End: ?
  Filled 2020-12-18: qty 1

## 2020-12-18 MED ORDER — LIDOCAINE HCL (PF) 1 % IJ SOLN
INTRAMUSCULAR | Status: AC
Start: 2020-12-18 — End: ?
  Filled 2020-12-18: qty 10

## 2020-12-18 MED ORDER — ONDANSETRON HCL 4 MG/2ML IJ SOLN
4.0000 mg | Freq: Once | INTRAMUSCULAR | Status: DC | PRN
Start: 2020-12-18 — End: 2020-12-18

## 2020-12-18 MED ORDER — PROMETHAZINE HCL 25 MG/ML IJ SOLN
6.2500 mg | Freq: Once | INTRAMUSCULAR | Status: DC | PRN
Start: 2020-12-18 — End: 2020-12-18

## 2020-12-18 MED ORDER — HYDROMORPHONE HCL 1 MG/ML IJ SOLN
0.5000 mg | INTRAMUSCULAR | Status: DC | PRN
Start: 2020-12-18 — End: 2020-12-18

## 2020-12-18 MED ORDER — LACTATED RINGERS IV SOLN
INTRAVENOUS | Status: DC | PRN
Start: 2020-12-18 — End: 2020-12-18

## 2020-12-18 MED ORDER — BACITRACIN ZINC 500 UNIT/GM EX OINT
TOPICAL_OINTMENT | CUTANEOUS | Status: AC
Start: 2020-12-18 — End: ?
  Filled 2020-12-18: qty 113.6

## 2020-12-18 MED ORDER — OXYCODONE-ACETAMINOPHEN 5-325 MG PO TABS
1.0000 | ORAL_TABLET | Freq: Once | ORAL | Status: AC | PRN
Start: 2020-12-18 — End: 2020-12-18

## 2020-12-18 MED ORDER — CLINDAMYCIN PHOSPHATE IN D5W 600 MG/50ML IV SOLN
600.0000 mg | INTRAVENOUS | Status: AC
Start: 2020-12-18 — End: 2020-12-18
  Administered 2020-12-18: 08:00:00 600 mg via INTRAVENOUS

## 2020-12-18 MED ORDER — BUPIVACAINE HCL (PF) 0.5 % IJ SOLN
INTRAMUSCULAR | Status: DC | PRN
Start: 2020-12-18 — End: 2020-12-18
  Administered 2020-12-18: 5 mL

## 2020-12-18 MED ORDER — FENTANYL CITRATE (PF) 50 MCG/ML IJ SOLN (WRAP)
INTRAMUSCULAR | Status: DC | PRN
Start: 2020-12-18 — End: 2020-12-18
  Administered 2020-12-18 (×2): 50 ug via INTRAVENOUS

## 2020-12-18 MED ORDER — BUPIVACAINE HCL (PF) 0.5 % IJ SOLN
INTRAMUSCULAR | Status: AC
Start: 2020-12-18 — End: ?
  Filled 2020-12-18: qty 120

## 2020-12-18 MED ORDER — PROPOFOL 10 MG/ML IV EMUL (WRAP)
INTRAVENOUS | Status: DC | PRN
Start: 2020-12-18 — End: 2020-12-18
  Administered 2020-12-18: 50 mg via INTRAVENOUS
  Administered 2020-12-18: 100 mg via INTRAVENOUS
  Administered 2020-12-18: 50 mg via INTRAVENOUS

## 2020-12-18 MED ORDER — FENTANYL CITRATE (PF) 50 MCG/ML IJ SOLN (WRAP)
INTRAMUSCULAR | Status: AC
Start: 2020-12-18 — End: ?
  Filled 2020-12-18: qty 2

## 2020-12-18 MED ORDER — HYDROCODONE-ACETAMINOPHEN 5-325 MG PO TABS
1.0000 | ORAL_TABLET | Freq: Four times a day (QID) | ORAL | 0 refills | Status: AC | PRN
Start: 2020-12-18 — End: 2020-12-25

## 2020-12-18 MED ORDER — LIDOCAINE HCL 1 % IJ SOLN
INTRAMUSCULAR | Status: DC | PRN
Start: 2020-12-18 — End: 2020-12-18
  Administered 2020-12-18: 5 mL

## 2020-12-18 MED ORDER — MIDAZOLAM HCL 1 MG/ML IJ SOLN (WRAP)
INTRAMUSCULAR | Status: AC
Start: 2020-12-18 — End: ?
  Filled 2020-12-18: qty 2

## 2020-12-18 MED ORDER — LIDOCAINE HCL 1 % IJ SOLN
INTRAMUSCULAR | Status: AC
Start: 2020-12-18 — End: ?
  Filled 2020-12-18: qty 80

## 2020-12-18 MED ORDER — OXYCODONE-ACETAMINOPHEN 5-325 MG PO TABS
ORAL_TABLET | ORAL | Status: AC
Start: 2020-12-18 — End: 2020-12-18
  Administered 2020-12-18: 09:00:00 1 via ORAL
  Filled 2020-12-18: qty 1

## 2020-12-18 MED ORDER — PROPOFOL INFUSION 10 MG/ML
INTRAVENOUS | Status: DC | PRN
Start: 2020-12-18 — End: 2020-12-18
  Administered 2020-12-18: 150 ug/kg/min via INTRAVENOUS

## 2020-12-18 MED ORDER — CLINDAMYCIN PHOSPHATE IN D5W 600 MG/50ML IV SOLN
INTRAVENOUS | Status: AC
Start: 2020-12-18 — End: ?
  Filled 2020-12-18: qty 50

## 2020-12-18 MED ORDER — LIDOCAINE HCL 2 % IJ SOLN
INTRAMUSCULAR | Status: DC | PRN
Start: 2020-12-18 — End: 2020-12-18
  Administered 2020-12-18: 50 mg

## 2020-12-18 SURGICAL SUPPLY — 62 items
APPLICATOR CHLORAPREP 26 ML 70% ISOPROPYL ALCOHOL 2% CHLORHEXIDINE (Applicator) ×1 IMPLANT
APPLICATOR PRP 70% ISPRP 2% CHG 26ML (Applicator) ×1
BANDAGE ACE NONSTERILE 2IN (Bandage) ×1
BANDAGE CMPR PLSTR CTTN MED MTRX 5YDX2IN (Bandage) ×1
BANDAGE CONFORM SOFT KLING STR (Dressing) ×1
BANDAGE CURITY CONFORM COMPRESSION L82 (Dressing) ×1 IMPLANT
BANDAGE MEDIUM COMPRESSION L5 YD X W2 IN ELASTIC HOOK LOOP CLOSURE (Bandage) ×1 IMPLANT
CHLORAPREP APLCTR ORANGE 26ML (Applicator) ×1
CORD BIPOLAR L12 FT CAUTERY MEDLINE (Cautery) ×1
CORD BIPOLAR L12 FT CAUTERY STANDARD FORCEPS (Cautery) ×1 IMPLANT
CORD CAUTERY BIPOLAR STRL 12FT (Cautery) ×1
COVER FLEXIBLE STERILE LATEX FREE LIGHT HANDLE GREEN (Procedure Accessories) ×1 IMPLANT
COVER LGHT HNDL LF STRL FLXB GRN (Procedure Accessories) ×1
COVER LIGHT HANDLE LATEX-FREE (Procedure Accessories) ×1
CUFF TOURNIQUET CYLINDRICAL L18 IN X W4 (Procedure Accessories) ×1
CUFF TOURNIQUET CYLINDRICAL L18 IN X W4 IN 2 PORT BLADDER ATS (Procedure Accessories) ×1 IMPLANT
DRAPE STERI U-DRAPE 47X51IN (Drape) ×1
DRAPE SURGICAL ADHESIVE L51 IN X W47 IN (Drape) ×1
DRAPE SURGICAL ADHESIVE L51 IN X W47 IN STERI-DRAPE CLEAR (Drape) ×1 IMPLANT
DRESSING EMLSN OIL CURITY 3X8 (Dressing) ×2 IMPLANT
GLOVE SURG BIOGEL INDIC SZ 7.0 (Glove) ×1
GLOVE SURG BIOGEL PF LTX SZ7.0 (Glove) ×1
GLOVE SURGICAL 7 BIOGEL INDICATOR POWDER (Glove) ×1
GLOVE SURGICAL 7 BIOGEL INDICATOR POWDER FREE SMOOTH BEAD CUFF (Glove) ×1 IMPLANT
GLOVE SURGICAL 7 BIOGEL SURGEONS POWDER (Glove) ×1
GLOVE SURGICAL 7 BIOGEL SURGEONS POWDER FREE BEAD CUFF TEXTURE SURFACE (Glove) ×1 IMPLANT
GOWN SMART IMPERVIOUS LARGE (Gown) ×1
GOWN SRG LG SMARTGOWN LF STRL LVL 4 (Gown) ×1
GOWN SRGCL LG LEVEL 4 BRTHBL STRL LF DSPSBL SMARTGOWN (Gown) ×1 IMPLANT
KIT RM TURNOVER LF NS DISP (Kits) ×1
KIT ROOM TURNOVER NONSTERILE LATEX FREE DISPOSABLE (Kits) ×1 IMPLANT
KIT TURNOVER ~~LOC~~ ~~LOC~~ (Kits) ×1
NEEDLE 18GX1.5 (Needles) ×2
NEEDLE HPO PP RW SFGLD 25GA 5/8IN STRL (Needles) ×1 IMPLANT
NEEDLE HPO SS PP RW PRCSNGL 18GA 1.5IN (Needles) ×2
NEEDLE HYPODERMIC L1 1/2 IN OD18 GA REGULAR WALL BD PRECISIONGLIDE (Needles) ×2 IMPLANT
NEEDLE INJ SFTY 25GX5/8IN (Needles) ×1
PAD ABDOMINAL L9 IN X W5 IN 4 SEAL EDGE (Dressing) ×1 IMPLANT
PAD ARMBOARD FOAM 20X8X2IN (Positioning Supplies)
PAD ARMBOARD L20 IN X W8 IN X H2 IN (Positioning Supplies)
PAD ARMBOARD L20 IN X W8 IN X H2 IN CONVOLUTE FOAM PURPLE (Positioning Supplies) IMPLANT
PAD CURITY ABDOMINAL 5X9IN (Dressing) ×1
PADDING CAST L12 FT X W2 IN REGULAR FINISH WEBRIL WEBRIL (Cast) ×1 IMPLANT
PADDING CAST STRL 2IN (Cast) ×1
PADDING CST WBRL 12FTX2IN LF STRL UNDCST (Cast) ×1
SOL NACL .9% IRRIG 500ML (Irrigation Solutions) ×1
SOLUTION IRRIGATION 0.9% SDM CHLORIDE 500ML PR BTTL ISOTONIC NONPRGNC (Irrigation Solutions) ×1 IMPLANT
SOLUTION IRRIGATION 0.9% SODIUM CHLORIDE (Irrigation Solutions) ×1
SPONGE GAUZE 12PLY STRL 4X4IN (Sponge) ×1
SPONGE GAUZE L4 IN X W4 IN 12 PLY CURITY (Sponge) ×1 IMPLANT
SUT ETHILON 4-0 PS2 18IN (Suture) ×1
SUTURE ETHILON BLACK 4-0 PS-2 L19 IN (Suture) ×1
SUTURE ETHILON BLACK 4-0 PS-2 L19 IN MONOFILAMENT NONABSORBABLE (Suture) ×1 IMPLANT
SYRINGE 10 ML GRADUATE NONPYROGENIC DEHP (Syringes, Needles) ×1
SYRINGE 10 ML GRADUATE NONPYROGENIC DEHP FREE PVC FREE LOK MEDICAL (Syringes, Needles) ×1 IMPLANT
SYRINGE LUER LOCK 10CC (Syringes, Needles) ×1
TOURNIQUET 18IN STRL (Procedure Accessories) ×1
TRAY HAND UPPER EXTREMITY INOV (Pack) ×1
TRAY SURGICAL HAND UPPER EXTREMITY ~~LOC~~ (Pack) ×1 IMPLANT
WRAP COBAN SELF ADHRNT 2INX5YD (Procedure Accessories) ×1
WRAP COMPRESSION L5 YD X W2 IN SELF (Procedure Accessories) ×1
WRAP COMPRESSION L5 YD X W2 IN SELF ADHESIVE ELASTIC LIGHTWEIGHT FULL (Procedure Accessories) ×1 IMPLANT

## 2020-12-18 NOTE — Anesthesia Postprocedure Evaluation (Signed)
Anesthesia Post Evaluation    Patient: Jade Carey    Procedure(s):  LEFT WRIST FIRST DORSAL COMPARTMENT RELEASE    Anesthesia type: MAC    Last Vitals:   Vitals Value Taken Time   BP 107/58 12/18/20 0845   Temp 36.2 C (97.2 F) 12/18/20 0811   Pulse 58 12/18/20 0845   Resp 21 12/18/20 0845   SpO2 100 % 12/18/20 0845                 Anesthesia Post Evaluation:     Patient Evaluated: PACU    Level of Consciousness: awake and alert    Pain Management: adequate    Airway Patency: patent    Anesthetic complications: No      PONV Status: none    Cardiovascular status: acceptable  Respiratory status: acceptable  Hydration status: acceptable        Signed by: [place] S Ronak Duquette, DO, 12/18/2020 11:07 AM

## 2020-12-18 NOTE — Progress Notes (Signed)
Discharge criteria met. Patient is awake and alert. Pain level is controlled to tolerable level. Respirations easy and non labored. All vital signs remain stable. Surgical dressing clean dry and intact. Family is at bedside. Discharge instructions printed and discussed with patient and COACH family, both acknowledged understanding of instructions. Copy of instructions provided to patient. Peripheral IV removed. Patient escorted to main lobby by RN via wheelchair, family member to drive patient home.

## 2020-12-18 NOTE — Anesthesia Preprocedure Evaluation (Signed)
Anesthesia Evaluation    AIRWAY    Mallampati: II    TM distance: >3 FB  Neck ROM: full  Mouth Opening:full  Planned to use difficult airway equipment: No CARDIOVASCULAR           DENTAL    no notable dental hx     PULMONARY         OTHER FINDINGS                  Relevant Problems   No relevant active problems       PSS Anesthesia Comments: 60 year old female for left wrist carpal tunnel release.      Medical hx oste in her right knee.     Surgical hx: Knee replacement, hysterectomy          Anesthesia Plan    ASA 2     MAC                     intravenous induction   Detailed anesthesia plan: MAC        Post op pain management: per surgeon and PO analgesics    informed consent obtained    Plan discussed with CRNA.    ECG reviewed  pertinent labs reviewed             Signed by: [place] S Aerielle Stoklosa, DO 12/18/20 6:42 AM

## 2020-12-18 NOTE — Transfer of Care (Signed)
Anesthesia Transfer of Care Note    Patient: Jade Carey    Procedures performed: Procedure(s):  LEFT WRIST FIRST DORSAL COMPARTMENT RELEASE    Anesthesia type: MAC    Patient location:Phase I PACU    Last vitals:   Vitals:    12/18/20 0811   BP: 107/46   Pulse: 74   Resp: 16   Temp: 36.2 C (97.2 F)   SpO2: 98%       Post pain: Patient not complaining of pain, continue current therapy      Mental Status:awake    Respiratory Function: tolerating face mask    Cardiovascular: stable    Nausea/Vomiting: patient not complaining of nausea or vomiting    Hydration Status: adequate    Post assessment: no apparent anesthetic complications and no reportable events    Signed by: Isidoro Donning, CRNA  12/18/20 8:11 AM

## 2020-12-18 NOTE — H&P (Signed)
ADMISSION HISTORY AND PHYSICAL EXAM    Date Time: 12/18/20 7:08 AM  Patient Name: Jade Carey  Attending Physician: Janeal Holmes, MD    Assessment:   60 year old right hand dominant female who presented with left DeQuervain's tenosynovitis. On exam, she is tender over the left first dorsal compartment. The patient has failed conservative management and 2 steroid injections for de Quervain tenosynovitis and is requesting surgical intervention.   Informed consent was obtained from the patient.    Plan:   We discussed 1st dorsal compartment release with possible brachioradialis sling reconstruction if there is tendon subluxation with wrist motion intraoperatively. Risks of surgery include, but are not limited to, risk of anesthesia, bleeding, infection, damage to nerves (in particular the dorsal radial sensory nerve)/arteries/tendons, persistent or recurrent symptoms, need for further surgery, RSD, weakness, loss of motion, and the need for extensive post-operative therapy and immobilization. The patient understands the risks and wishes to proceed with left wrist 1st dorsal compartment release.    History of Present Illness:   Jade Carey is a 60 y.o. female who presents to the hospital with left wrist dequervain's    Past Medical History:     Past Medical History:   Diagnosis Date    Complication of anesthesia     was told "high tolerance" to meds       Past Surgical History:     Past Surgical History:   Procedure Laterality Date    CHOLECYSTECTOMY      HYSTERECTOMY      JOINT REPLACEMENT      LTKR       Family History:   History reviewed. No pertinent family history.    Social History:     Social History     Socioeconomic History    Marital status: Single     Spouse name: Not on file    Number of children: Not on file    Years of education: Not on file    Highest education level: Not on file   Occupational History    Not on file   Tobacco Use    Smoking status: Never    Smokeless tobacco: Never   Vaping  Use    Vaping Use: Never used   Substance and Sexual Activity    Alcohol use: Never    Drug use: Never    Sexual activity: Not on file   Other Topics Concern    Not on file   Social History Narrative    Not on file     Social Determinants of Health     Financial Resource Strain: Not on file   Food Insecurity: Not on file   Transportation Needs: Not on file   Physical Activity: Not on file   Stress: Not on file   Social Connections: Not on file   Intimate Partner Violence: Not on file   Housing Stability: Not on file       Allergies:     Allergies   Allergen Reactions    Penicillins Hives       Medications:     Medications Prior to Admission   Medication Sig    diclofenac (VOLTAREN) 75 MG EC tablet TAKE 1 TABLET BY MOUTH TWICE A DAY AS NEEDED FOR KNEE PAIN    Multiple Vitamin (multivitamin) capsule Take 1 capsule by mouth daily       Review of Systems:   A comprehensive review of systems was: Negative except left wrist pain  Physical Exam:     Vitals:    12/18/20 0626   BP: 163/78   Pulse: (!) 56   Resp: 16   Temp: 97.8 F (36.6 C)       Intake and Output Summary (Last 24 hours) at Date Time  No intake or output data in the 24 hours ending 12/18/20 0708    General appearance - alert, well appearing, and in no distress  Mental status - alert, oriented to person, place, and time  Eyes - pupils equal and reactive, extraocular eye movements intact  Ears - bilateral external ear canals normal  Nose - normal and patent, no erythema, discharge  Mouth - mucous membranes moist, pharynx normal  Neck - supple  Chest - clear to auscultation, symmetric air entry  Heart - normal rate, regular rhythm  Abdomen - soft, nontender, nondistended  Neurological - alert, oriented, normal speech, no focal findings or movement disorder noted  Musculoskeletal - no joint tenderness, deformity or swelling  Extremities - no pedal edema, no clubbing or cyanosis  Skin - normal coloration and turgor, no rashes, no suspicious skin lesions  noted      Labs:     Results       ** No results found for the last 24 hours. **                Rads:   Radiological Procedure reviewed.    Signed by: Janeal Holmes, MD

## 2020-12-18 NOTE — Discharge Instructions (Signed)
Post Anesthesia Discharge Instructions    Although you may be awake and alert in the recovery room, small amounts of anesthetic remain in your system for about 24 hours.  You may feel tired and sleepy during this time.      You are advised to go directly home from the hospital.    Plan to stay at home and rest for the remainder of the day.    It is advisable to have someone with you at home for 24 hours after surgery.    Do not operate a motor vehicle, or any mechanical or electrical equipment for the next 24 hours.      Be careful when you are walking around, you may become dizzy.  The effects of anesthesia and/or medications are still present and drowsiness may occur    Do not consume alcohol, tranquilizers, sleeping medications, or any other non prescribed medication for the remainder of the day.    Diet:  begin with liquids, progress your diet as tolerated or as directed by your surgeon.  Nausea and vomiting may occur in the next 24 hours.

## 2020-12-19 ENCOUNTER — Other Ambulatory Visit: Payer: Self-pay

## 2020-12-19 ENCOUNTER — Encounter: Payer: Self-pay | Admitting: Orthopaedic Surgery

## 2020-12-19 MED ORDER — BACITRACIN +/- ZINC 500 UNIT/GM EX OINT (WRAP)
TOPICAL_OINTMENT | CUTANEOUS | Status: DC | PRN
Start: 2020-12-18 — End: 2020-12-19
  Administered 2020-12-18: 1 via TOPICAL

## 2020-12-23 ENCOUNTER — Other Ambulatory Visit: Payer: Self-pay

## 2020-12-23 ENCOUNTER — Encounter: Payer: Self-pay | Admitting: Internal Medicine

## 2020-12-23 ENCOUNTER — Ambulatory Visit (INDEPENDENT_AMBULATORY_CARE_PROVIDER_SITE_OTHER): Payer: BC Managed Care – PPO | Admitting: Internal Medicine

## 2020-12-23 VITALS — BP 141/55 | HR 68 | Ht 60.0 in | Wt 113.5 lb

## 2020-12-23 DIAGNOSIS — J029 Acute pharyngitis, unspecified: Secondary | ICD-10-CM | POA: Diagnosis not present

## 2020-12-23 DIAGNOSIS — C259 Malignant neoplasm of pancreas, unspecified: Secondary | ICD-10-CM

## 2020-12-23 DIAGNOSIS — J431 Panlobular emphysema: Secondary | ICD-10-CM | POA: Diagnosis not present

## 2020-12-23 DIAGNOSIS — F1221 Cannabis dependence, in remission: Secondary | ICD-10-CM

## 2020-12-23 DIAGNOSIS — J3089 Other allergic rhinitis: Secondary | ICD-10-CM | POA: Diagnosis not present

## 2020-12-23 DIAGNOSIS — F3113 Bipolar disorder, current episode manic without psychotic features, severe: Secondary | ICD-10-CM

## 2020-12-23 LAB — POCT INFLUENZA A/B
Influenza A, POC: NEGATIVE
Influenza B, POC: NEGATIVE

## 2020-12-23 LAB — POC COVID19 BINAXNOW: SARS Coronavirus 2 Ag: NEGATIVE

## 2020-12-23 NOTE — Assessment & Plan Note (Signed)
Cannabis use is in remission

## 2020-12-23 NOTE — Assessment & Plan Note (Signed)
Patient is going to [place] to see a Psychologist, sport and exercise

## 2020-12-23 NOTE — Progress Notes (Signed)
Established Patient Office Visit  Subjective:  Patient ID: Sheryl Perez, female    DOB: [birth year]  Age: 60 y.o. MRN: [id number]  CC:  Chief Complaint  Patient presents with   Employment Physical    Physical exam for work     HPI  Sheryl Perez presents for sore throat.  Patient has a history of pancreatic cancer.  He does not have any fever or chills she is getting chemotherapy.  She is also going to see a Psychologist, sport and exercise at [facility] for a checkup on the pancreatic cancer.  I will check her for flu and COVID test will be done.  Her chest is clear heart is regular abdomen is soft nontender without any hepatosplenomegaly.  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    Bipolar disorder (HCC)    Chest pain, unspecified    Chronic low back pain 10/23/2013   Chronic UTI    COPD (chronic obstructive pulmonary disease) (HCC)    Depression    Dysfunction of eustachian tube    Enlargement of lymph nodes    Family history of brain cancer    Family history of breast cancer    Family history of prostate cancer    History of Bell's palsy Right   Injury, other and unspecified, knee, leg, ankle, and foot    MRSA (methicillin resistant staph aureus) culture positive 07/11/2014   abscess   Other specified disease of hair and hair follicles    Pancreatic cancer ([place])    Pancreatic cancer ([place])    Rectal prolapse    Unspecified symptom associated with female genital organs     Past Surgical History:  Procedure Laterality Date   ABDOMINAL HYSTERECTOMY     bladder tack  2010   CHOLECYSTECTOMY  2002   FOOT SURGERY Bilateral 2004   bunion   GANGLION CYST EXCISION  2002   INCISION AND DRAINAGE ABSCESS  04-16-15   PARTIAL HYSTERECTOMY  2010   uterus removed   PORTA CATH INSERTION N/A 09/11/2020   Procedure: PORTA CATH INSERTION;  Surgeon: Algernon Huxley, MD;  Location: [facility];  Service: Cardiovascular;  Laterality: N/A;     Family History  Problem Relation Age of Onset   Diabetes Father    Cancer Father        brain tumor   Depression Father    Depression Sister    Alcohol abuse Brother    Depression Brother    Leukemia Maternal Aunt    Prostate cancer Maternal Uncle    Breast cancer Paternal Aunt        dx >50   Heart disease Maternal Grandmother    Heart attack Maternal Grandmother    Stroke Maternal Grandmother    Parkinson's disease Maternal Grandfather    Leukemia Paternal Grandmother    Diabetes Paternal Grandfather    Breast cancer Cousin    Brain cancer Niece        dx under 36    Social History   Socioeconomic History   Marital status: Married    Spouse name: Not on file   Number of children: 3   Years of education: hs   Highest education level: Some college, no degree  Occupational History   Not on file  Tobacco Use   Smoking status: Former    Packs/day: 0.50    Years: 40.00    Pack years: 20.00  Types: Cigarettes    Quit date: 07/14/2020    Years since quitting: 0.4   Smokeless tobacco: Never  Vaping Use   Vaping Use: Never used  Substance and Sexual Activity   Alcohol use: No    Alcohol/week: 1.0 standard drink    Types: 1 Glasses of wine per week    Comment: occasional   Drug use: Yes    Types: Marijuana    Comment: last used 05-20-15   Sexual activity: Yes    Birth control/protection: None  Other Topics Concern   Not on file  Social History Narrative   Patient lives at home with her husband [name]).   Patient works full time.   Education CNA    Right handed.   Caffeine Tea , Soda.   Social Determinants of Health   Financial Resource Strain: Not on file  Food Insecurity: Not on file  Transportation Needs: Not on file  Physical Activity: Not on file  Stress: Not on file  Social Connections: Not on file  Intimate Partner Violence: Not on file     Current Outpatient Medications:    ALPRAZolam (XANAX) 0.5 MG tablet, Take 1 tablet (0.5 mg total)  by mouth 2 (two) times daily as needed for anxiety. (Patient not taking: No sig reported), Disp: 20 tablet, Rfl: 1   Ascorbic Acid (VITAMIN C) 1000 MG tablet, Take 1,000 mg by mouth daily., Disp: , Rfl:    dexamethasone (DECADRON) 4 MG tablet, Take 2 tablets (8 mg total) by mouth daily. Start the day after chemotherapy for 3 days. Take with food., Disp: 8 tablet, Rfl: 5   divalproex (DEPAKOTE) 250 MG DR tablet, Take 1 tablet (250 mg total) by mouth 2 (two) times daily., Disp: 180 tablet, Rfl: 1   famotidine (PEPCID) 20 MG tablet, Take 20 mg by mouth daily., Disp: , Rfl:    ferrous sulfate 325 (65 FE) MG tablet, Take 325 mg by mouth 2 (two) times daily with a meal., Disp: , Rfl:    fluticasone (FLONASE) 50 MCG/ACT nasal spray, Place 1 spray into both nostrils daily. (Patient taking differently: Place 1 spray into both nostrils daily as needed.), Disp: 11.1 mL, Rfl: 2   KLOR-CON M20 20 MEQ tablet, TAKE 1 TABLET BY MOUTH EVERY DAY, Disp: 21 tablet, Rfl: 0   loperamide (IMODIUM A-D) 2 MG tablet, Take 2 at onset of diarrhea, then 1 every 2hrs until 12hr without a BM. May take 2 tab every 4hrs at bedtime. If diarrhea recurs repeat., Disp: 100 tablet, Rfl: 1   loratadine (CLARITIN) 10 MG tablet, TAKE 1 TABLET BY MOUTH EVERY DAY (Patient not taking: Reported on 12/12/2020), Disp: 90 tablet, Rfl: 1   Multiple Vitamin (MULTIVITAMIN) tablet, Take 1 tablet by mouth daily., Disp: , Rfl:    naproxen sodium (ALEVE) 220 MG tablet, Take 220 mg by mouth daily as needed. (Patient not taking: No sig reported), Disp: , Rfl:    OLANZapine (ZYPREXA) 10 MG tablet, Take 1 tablet (10 mg total) by mouth at bedtime., Disp: 30 tablet, Rfl: 0   ondansetron (ZOFRAN) 8 MG tablet, Take 1 tablet (8 mg total) by mouth 2 (two) times daily as needed. Start on day 3 after chemotherapy. (Patient not taking: Reported on 12/12/2020), Disp: 30 tablet, Rfl: 1   prochlorperazine (COMPAZINE) 10 MG tablet, Take 1 tablet (10 mg total) by mouth every  6 (six) hours as needed (Nausea or vomiting). (Patient not taking: Reported on 12/12/2020), Disp: 30 tablet, Rfl: 1  propranolol (INDERAL) 20 MG tablet, Take 1 tablet (20 mg total) by mouth 3 (three) times daily., Disp: 270 tablet, Rfl: 1   QUEtiapine (SEROQUEL) 50 MG tablet, Take 1 tablet (50 mg total) by mouth at bedtime as needed. (Patient not taking: Reported on 12/12/2020), Disp: 90 tablet, Rfl: 0 No current facility-administered medications for this visit.  Facility-Administered Medications Ordered in Other Visits:    heparin lock flush 100 UNIT/ML injection, , , ,    sodium chloride flush (NS) 0.9 % injection 10 mL, 10 mL, Intracatheter, PRN, Sindy Guadeloupe, MD, 10 mL at 11/03/20 1441   Allergies  Allergen Reactions   Sulfacetamide Sodium Swelling    Diarrhea,dizziness, tongue swelling   Other Other (See Comments) and Swelling    PT states that anything with the sides effects "may cause rash" she cannot take due to her psoriasis, it will make the patches on her hand so thick she can't use her hands.  Diarrhea,dizziness, tongue swelling   Oxycontin [Oxycodone Hcl] Nausea And Vomiting   Sulfa Antibiotics Swelling    Diarrhea,dizziness, tongue swelling   Abilify [Aripiprazole] Other (See Comments)   Cephalosporins Rash   Erythromycin Other (See Comments)    Abd. pain Abd. pain Other reaction(s): Other (See Comments) Abd. pain   Lamotrigine Rash and Other (See Comments)    psorasis worsen psorasis worsen   Risperidone Other (See Comments)    tremors tremors tremors    ROS Review of Systems  Constitutional: Negative.   HENT: Negative.    Eyes: Negative.   Respiratory: Negative.    Cardiovascular: Negative.   Gastrointestinal: Negative.   Endocrine: Negative.   Genitourinary: Negative.   Musculoskeletal: Negative.   Skin: Negative.   Allergic/Immunologic: Negative.   Neurological: Negative.   Hematological: Negative.   Psychiatric/Behavioral: Negative.    All other  systems reviewed and are negative.    Objective:    Physical Exam Vitals reviewed.  Constitutional:      Appearance: Normal appearance.  HENT:     Mouth/Throat:     Mouth: Mucous membranes are moist.  Eyes:     Pupils: Pupils are equal, round, and reactive to light.  Neck:     Vascular: No carotid bruit.  Cardiovascular:     Rate and Rhythm: Normal rate and regular rhythm.     Pulses: Normal pulses.     Heart sounds: Normal heart sounds.  Pulmonary:     Effort: Pulmonary effort is normal.     Breath sounds: Normal breath sounds.  Abdominal:     General: Bowel sounds are normal.     Palpations: Abdomen is soft. There is no hepatomegaly, splenomegaly or mass.     Tenderness: There is no abdominal tenderness.     Hernia: No hernia is present.  Musculoskeletal:        General: No tenderness.     Cervical back: Neck supple.     Right lower leg: No edema.     Left lower leg: No edema.  Skin:    Findings: No rash.  Neurological:     Mental Status: She is alert and oriented to person, place, and time.     Motor: No weakness.  Psychiatric:        Mood and Affect: Mood and affect normal.        Behavior: Behavior normal.    BP (!) 141/55   Pulse 68   Ht 5' (1.524 m)   Wt 113 lb 8 oz (51.5 kg)  LMP 02/09/2008   BMI 22.17 kg/m  Wt Readings from Last 3 Encounters:  12/23/20 113 lb 8 oz (51.5 kg)  12/12/20 115 lb 14.4 oz (52.6 kg)  11/28/20 115 lb (52.2 kg)     There are no preventive care reminders to display for this patient.   There are no preventive care reminders to display for this patient.  Lab Results  Component Value Date   TSH 1.27 12/12/2019   Lab Results  Component Value Date   WBC 6.0 12/12/2020   HGB 9.4 (L) 12/12/2020   HCT 28.5 (L) 12/12/2020   MCV 104.0 (H) 12/12/2020   PLT 163 12/12/2020   Lab Results  Component Value Date   NA 139 12/12/2020   K 4.0 12/12/2020   CO2 27 12/12/2020   GLUCOSE 121 (H) 12/12/2020   BUN 9 12/12/2020    CREATININE 0.81 12/12/2020   BILITOT 0.3 12/12/2020   ALKPHOS 89 12/12/2020   AST 30 12/12/2020   ALT 19 12/12/2020   PROT 6.9 12/12/2020   ALBUMIN 3.5 12/12/2020   CALCIUM 8.7 (L) 12/12/2020   ANIONGAP 6 12/12/2020   Lab Results  Component Value Date   CHOL 197 12/12/2019   Lab Results  Component Value Date   HDL 51 12/12/2019   Lab Results  Component Value Date   LDLCALC 123 (H) 12/12/2019   Lab Results  Component Value Date   TRIG 68 08/04/2020   Lab Results  Component Value Date   CHOLHDL 3.9 12/12/2019   Lab Results  Component Value Date   HGBA1C 5.3 08/24/2018      Assessment & Plan:   Problem List Items Addressed This Visit       Respiratory   Allergic rhinitis due to allergen    Take Claritin 5 mg p.o. as needed for allergy      Panlobular emphysema ([place])    Patient has quit smoking        Digestive   Pancreatic adenocarcinoma [facility])    Patient is going to [place] to see a surgeon        Other   Bipolar disorder, manic ([place])    Stable at the present time.      Cannabis use disorder, moderate, in early remission ([place])    Cannabis use is in remission      Other Visit Diagnoses     Sore throat    -  Primary   Relevant Orders   POC COVID-19   POCT Influenza A/B       No orders of the defined types were placed in this encounter.   Follow-up: No follow-ups on file.    Cletis Athens, MD

## 2020-12-23 NOTE — Assessment & Plan Note (Signed)
Take Claritin 5 mg p.o. as needed for allergy. 

## 2020-12-23 NOTE — Assessment & Plan Note (Signed)
Stable at the present time. 

## 2020-12-23 NOTE — Assessment & Plan Note (Signed)
Patient has quit smoking ?

## 2020-12-26 DIAGNOSIS — F3110 Bipolar disorder, current episode manic without psychotic features, unspecified: Secondary | ICD-10-CM | POA: Diagnosis not present

## 2020-12-26 DIAGNOSIS — Z01818 Encounter for other preprocedural examination: Secondary | ICD-10-CM | POA: Diagnosis not present

## 2020-12-26 DIAGNOSIS — Z87891 Personal history of nicotine dependence: Secondary | ICD-10-CM | POA: Diagnosis not present

## 2020-12-26 DIAGNOSIS — C259 Malignant neoplasm of pancreas, unspecified: Secondary | ICD-10-CM | POA: Diagnosis not present

## 2020-12-29 ENCOUNTER — Telehealth: Payer: Self-pay | Admitting: *Deleted

## 2020-12-29 DIAGNOSIS — C259 Malignant neoplasm of pancreas, unspecified: Secondary | ICD-10-CM

## 2020-12-29 NOTE — Telephone Encounter (Signed)
Called to let pt. Know that we have cancelled 12/2 appt for chemo because pt is going for surgery. We have made an appt for her 02/13/2021 1:30 labs and then see md. Pt aware and if some reason that pt can't come or still recovering from surgery to reach out for r/s. She is agreeable

## 2021-01-07 ENCOUNTER — Telehealth: Payer: Self-pay

## 2021-01-07 NOTE — Telephone Encounter (Signed)
pt called and canceled her appt in december she states she having her cancer surgery on 01-13-21

## 2021-01-07 NOTE — Telephone Encounter (Signed)
Thank you for letting me know

## 2021-01-09 ENCOUNTER — Ambulatory Visit: Payer: BC Managed Care – PPO

## 2021-01-09 ENCOUNTER — Other Ambulatory Visit: Payer: BC Managed Care – PPO

## 2021-01-09 ENCOUNTER — Ambulatory Visit: Payer: BC Managed Care – PPO | Admitting: Oncology

## 2021-01-10 ENCOUNTER — Other Ambulatory Visit: Payer: Self-pay | Admitting: Oncology

## 2021-01-12 DIAGNOSIS — T413X5A Adverse effect of local anesthetics, initial encounter: Secondary | ICD-10-CM | POA: Diagnosis not present

## 2021-01-12 DIAGNOSIS — K219 Gastro-esophageal reflux disease without esophagitis: Secondary | ICD-10-CM | POA: Diagnosis not present

## 2021-01-12 DIAGNOSIS — C259 Malignant neoplasm of pancreas, unspecified: Secondary | ICD-10-CM | POA: Diagnosis not present

## 2021-01-12 DIAGNOSIS — J449 Chronic obstructive pulmonary disease, unspecified: Secondary | ICD-10-CM | POA: Diagnosis not present

## 2021-01-12 DIAGNOSIS — Z20822 Contact with and (suspected) exposure to covid-19: Secondary | ICD-10-CM | POA: Diagnosis not present

## 2021-01-12 DIAGNOSIS — Z87891 Personal history of nicotine dependence: Secondary | ICD-10-CM | POA: Diagnosis not present

## 2021-01-12 DIAGNOSIS — Z882 Allergy status to sulfonamides status: Secondary | ICD-10-CM | POA: Diagnosis not present

## 2021-01-12 DIAGNOSIS — I952 Hypotension due to drugs: Secondary | ICD-10-CM | POA: Diagnosis not present

## 2021-01-12 DIAGNOSIS — Z888 Allergy status to other drugs, medicaments and biological substances status: Secondary | ICD-10-CM | POA: Diagnosis not present

## 2021-01-12 DIAGNOSIS — Z885 Allergy status to narcotic agent status: Secondary | ICD-10-CM | POA: Diagnosis not present

## 2021-01-12 DIAGNOSIS — C250 Malignant neoplasm of head of pancreas: Secondary | ICD-10-CM | POA: Diagnosis not present

## 2021-01-12 DIAGNOSIS — Z881 Allergy status to other antibiotic agents status: Secondary | ICD-10-CM | POA: Diagnosis not present

## 2021-01-12 DIAGNOSIS — C772 Secondary and unspecified malignant neoplasm of intra-abdominal lymph nodes: Secondary | ICD-10-CM | POA: Diagnosis not present

## 2021-01-12 DIAGNOSIS — F319 Bipolar disorder, unspecified: Secondary | ICD-10-CM | POA: Diagnosis not present

## 2021-01-12 DIAGNOSIS — G8918 Other acute postprocedural pain: Secondary | ICD-10-CM | POA: Diagnosis not present

## 2021-01-12 DIAGNOSIS — Z9049 Acquired absence of other specified parts of digestive tract: Secondary | ICD-10-CM | POA: Diagnosis not present

## 2021-01-12 DIAGNOSIS — Z79899 Other long term (current) drug therapy: Secondary | ICD-10-CM | POA: Diagnosis not present

## 2021-01-12 DIAGNOSIS — D500 Iron deficiency anemia secondary to blood loss (chronic): Secondary | ICD-10-CM | POA: Diagnosis not present

## 2021-01-21 ENCOUNTER — Ambulatory Visit: Payer: BC Managed Care – PPO | Admitting: Psychiatry

## 2021-01-29 ENCOUNTER — Other Ambulatory Visit: Payer: Self-pay | Admitting: Internal Medicine

## 2021-01-30 DIAGNOSIS — C259 Malignant neoplasm of pancreas, unspecified: Secondary | ICD-10-CM | POA: Diagnosis not present

## 2021-02-12 DIAGNOSIS — C259 Malignant neoplasm of pancreas, unspecified: Secondary | ICD-10-CM | POA: Diagnosis not present

## 2021-02-13 ENCOUNTER — Other Ambulatory Visit: Payer: Self-pay

## 2021-02-13 ENCOUNTER — Inpatient Hospital Stay: Payer: BC Managed Care – PPO | Attending: Oncology

## 2021-02-13 ENCOUNTER — Telehealth: Payer: Self-pay | Admitting: *Deleted

## 2021-02-13 ENCOUNTER — Inpatient Hospital Stay: Payer: BC Managed Care – PPO | Admitting: Oncology

## 2021-02-13 DIAGNOSIS — Z818 Family history of other mental and behavioral disorders: Secondary | ICD-10-CM | POA: Diagnosis not present

## 2021-02-13 DIAGNOSIS — C257 Malignant neoplasm of other parts of pancreas: Secondary | ICD-10-CM | POA: Diagnosis not present

## 2021-02-13 DIAGNOSIS — Z79899 Other long term (current) drug therapy: Secondary | ICD-10-CM | POA: Diagnosis not present

## 2021-02-13 DIAGNOSIS — Z5111 Encounter for antineoplastic chemotherapy: Secondary | ICD-10-CM | POA: Diagnosis not present

## 2021-02-13 DIAGNOSIS — Z9049 Acquired absence of other specified parts of digestive tract: Secondary | ICD-10-CM | POA: Insufficient documentation

## 2021-02-13 DIAGNOSIS — Z808 Family history of malignant neoplasm of other organs or systems: Secondary | ICD-10-CM | POA: Diagnosis not present

## 2021-02-13 DIAGNOSIS — Z90411 Acquired partial absence of pancreas: Secondary | ICD-10-CM | POA: Insufficient documentation

## 2021-02-13 DIAGNOSIS — Z7952 Long term (current) use of systemic steroids: Secondary | ICD-10-CM | POA: Diagnosis not present

## 2021-02-13 DIAGNOSIS — Z881 Allergy status to other antibiotic agents status: Secondary | ICD-10-CM | POA: Diagnosis not present

## 2021-02-13 DIAGNOSIS — Z87891 Personal history of nicotine dependence: Secondary | ICD-10-CM | POA: Diagnosis not present

## 2021-02-13 DIAGNOSIS — R5383 Other fatigue: Secondary | ICD-10-CM | POA: Diagnosis not present

## 2021-02-13 DIAGNOSIS — Z8249 Family history of ischemic heart disease and other diseases of the circulatory system: Secondary | ICD-10-CM | POA: Diagnosis not present

## 2021-02-13 DIAGNOSIS — Z5189 Encounter for other specified aftercare: Secondary | ICD-10-CM | POA: Insufficient documentation

## 2021-02-13 DIAGNOSIS — Z803 Family history of malignant neoplasm of breast: Secondary | ICD-10-CM | POA: Insufficient documentation

## 2021-02-13 DIAGNOSIS — Z811 Family history of alcohol abuse and dependence: Secondary | ICD-10-CM | POA: Diagnosis not present

## 2021-02-13 DIAGNOSIS — Z833 Family history of diabetes mellitus: Secondary | ICD-10-CM | POA: Insufficient documentation

## 2021-02-13 DIAGNOSIS — E876 Hypokalemia: Secondary | ICD-10-CM | POA: Insufficient documentation

## 2021-02-13 DIAGNOSIS — Z806 Family history of leukemia: Secondary | ICD-10-CM | POA: Insufficient documentation

## 2021-02-13 DIAGNOSIS — F319 Bipolar disorder, unspecified: Secondary | ICD-10-CM | POA: Diagnosis not present

## 2021-02-13 DIAGNOSIS — Z885 Allergy status to narcotic agent status: Secondary | ICD-10-CM | POA: Diagnosis not present

## 2021-02-13 DIAGNOSIS — D649 Anemia, unspecified: Secondary | ICD-10-CM | POA: Insufficient documentation

## 2021-02-13 DIAGNOSIS — Z882 Allergy status to sulfonamides status: Secondary | ICD-10-CM | POA: Insufficient documentation

## 2021-02-13 DIAGNOSIS — Z87440 Personal history of urinary (tract) infections: Secondary | ICD-10-CM | POA: Insufficient documentation

## 2021-02-13 DIAGNOSIS — Z888 Allergy status to other drugs, medicaments and biological substances status: Secondary | ICD-10-CM | POA: Insufficient documentation

## 2021-02-13 DIAGNOSIS — C259 Malignant neoplasm of pancreas, unspecified: Secondary | ICD-10-CM

## 2021-02-13 DIAGNOSIS — Z8042 Family history of malignant neoplasm of prostate: Secondary | ICD-10-CM | POA: Insufficient documentation

## 2021-02-13 LAB — CBC WITH DIFFERENTIAL/PLATELET
Abs Immature Granulocytes: 0.02 10*3/uL (ref 0.00–0.07)
Basophils Absolute: 0 10*3/uL (ref 0.0–0.1)
Basophils Relative: 0 %
Eosinophils Absolute: 0.2 10*3/uL (ref 0.0–0.5)
Eosinophils Relative: 4 %
HCT: 32.3 % — ABNORMAL LOW (ref 36.0–46.0)
Hemoglobin: 11 g/dL — ABNORMAL LOW (ref 12.0–15.0)
Immature Granulocytes: 0 %
Lymphocytes Relative: 35 %
Lymphs Abs: 2.3 10*3/uL (ref 0.7–4.0)
MCH: 34.3 pg — ABNORMAL HIGH (ref 26.0–34.0)
MCHC: 34.1 g/dL (ref 30.0–36.0)
MCV: 100.6 fL — ABNORMAL HIGH (ref 80.0–100.0)
Monocytes Absolute: 0.5 10*3/uL (ref 0.1–1.0)
Monocytes Relative: 8 %
Neutro Abs: 3.5 10*3/uL (ref 1.7–7.7)
Neutrophils Relative %: 53 %
Platelets: 274 10*3/uL (ref 150–400)
RBC: 3.21 MIL/uL — ABNORMAL LOW (ref 3.87–5.11)
RDW: 12.3 % (ref 11.5–15.5)
WBC: 6.6 10*3/uL (ref 4.0–10.5)
nRBC: 0 % (ref 0.0–0.2)

## 2021-02-13 LAB — COMPREHENSIVE METABOLIC PANEL
ALT: 23 U/L (ref 0–44)
AST: 33 U/L (ref 15–41)
Albumin: 3.7 g/dL (ref 3.5–5.0)
Alkaline Phosphatase: 104 U/L (ref 38–126)
Anion gap: 9 (ref 5–15)
BUN: 11 mg/dL (ref 6–20)
CO2: 25 mmol/L (ref 22–32)
Calcium: 8.9 mg/dL (ref 8.9–10.3)
Chloride: 101 mmol/L (ref 98–111)
Creatinine, Ser: 0.99 mg/dL (ref 0.44–1.00)
GFR, Estimated: >60 mL/min (ref >60)
Glucose, Bld: 136 mg/dL — ABNORMAL HIGH (ref 70–99)
Potassium: 2.9 mmol/L — ABNORMAL LOW (ref 3.5–5.1)
Sodium: 135 mmol/L (ref 135–145)
Total Bilirubin: 0.4 mg/dL (ref 0.3–1.2)
Total Protein: 7.4 g/dL (ref 6.5–8.1)

## 2021-02-13 MED ORDER — POTASSIUM CHLORIDE CRYS ER 20 MEQ PO TBCR: 20.0000 meq | EXTENDED_RELEASE_TABLET | Freq: Two times a day (BID) | ORAL | 0 refills | Status: DC

## 2021-02-13 NOTE — Telephone Encounter (Signed)
Called pt at 5;30 PM to let pt know tha her potassium was 2.7. pt states she is taking potassium once a day but she forgot yest. And not taking it today. Dr. Janese Banks says that she needs to be on the potassium twice a day. The pt only has 6 pills left and I called her in 2 tablets a day for 7 days. Pt is coming in to see rao on Monday 1/9. I have asked her to stay on the potassium pills 1 tablet  twice a day until she come to see Janese Banks and she can ask her if the needs to take the entire 14 day supply. She is agreeable with this plan above

## 2021-02-14 LAB — CANCER ANTIGEN 19-9: CA 19-9: 23 U/mL (ref 0–35)

## 2021-02-16 ENCOUNTER — Telehealth: Payer: Self-pay | Admitting: *Deleted

## 2021-02-16 ENCOUNTER — Encounter: Payer: Self-pay | Admitting: Oncology

## 2021-02-16 ENCOUNTER — Other Ambulatory Visit: Payer: Self-pay

## 2021-02-16 ENCOUNTER — Inpatient Hospital Stay (HOSPITAL_BASED_OUTPATIENT_CLINIC_OR_DEPARTMENT_OTHER): Payer: BC Managed Care – PPO | Admitting: Oncology

## 2021-02-16 ENCOUNTER — Telehealth: Payer: Self-pay | Admitting: Oncology

## 2021-02-16 VITALS — BP 120/47 | HR 69 | Temp 97.6°F | Resp 16 | Ht 60.0 in | Wt 112.3 lb

## 2021-02-16 DIAGNOSIS — Z79899 Other long term (current) drug therapy: Secondary | ICD-10-CM | POA: Diagnosis not present

## 2021-02-16 DIAGNOSIS — Z5111 Encounter for antineoplastic chemotherapy: Secondary | ICD-10-CM | POA: Diagnosis not present

## 2021-02-16 DIAGNOSIS — C259 Malignant neoplasm of pancreas, unspecified: Secondary | ICD-10-CM

## 2021-02-16 DIAGNOSIS — Z7952 Long term (current) use of systemic steroids: Secondary | ICD-10-CM | POA: Diagnosis not present

## 2021-02-16 DIAGNOSIS — C257 Malignant neoplasm of other parts of pancreas: Secondary | ICD-10-CM | POA: Diagnosis not present

## 2021-02-16 DIAGNOSIS — Z87440 Personal history of urinary (tract) infections: Secondary | ICD-10-CM | POA: Diagnosis not present

## 2021-02-16 DIAGNOSIS — R5383 Other fatigue: Secondary | ICD-10-CM | POA: Diagnosis not present

## 2021-02-16 DIAGNOSIS — Z881 Allergy status to other antibiotic agents status: Secondary | ICD-10-CM | POA: Diagnosis not present

## 2021-02-16 DIAGNOSIS — Z7189 Other specified counseling: Secondary | ICD-10-CM

## 2021-02-16 DIAGNOSIS — Z882 Allergy status to sulfonamides status: Secondary | ICD-10-CM | POA: Diagnosis not present

## 2021-02-16 DIAGNOSIS — Z5189 Encounter for other specified aftercare: Secondary | ICD-10-CM | POA: Diagnosis not present

## 2021-02-16 DIAGNOSIS — Z87891 Personal history of nicotine dependence: Secondary | ICD-10-CM | POA: Diagnosis not present

## 2021-02-16 DIAGNOSIS — Z888 Allergy status to other drugs, medicaments and biological substances status: Secondary | ICD-10-CM | POA: Diagnosis not present

## 2021-02-16 DIAGNOSIS — Z885 Allergy status to narcotic agent status: Secondary | ICD-10-CM | POA: Diagnosis not present

## 2021-02-16 DIAGNOSIS — F319 Bipolar disorder, unspecified: Secondary | ICD-10-CM | POA: Diagnosis not present

## 2021-02-16 DIAGNOSIS — E876 Hypokalemia: Secondary | ICD-10-CM | POA: Diagnosis not present

## 2021-02-16 DIAGNOSIS — D649 Anemia, unspecified: Secondary | ICD-10-CM | POA: Diagnosis not present

## 2021-02-16 DIAGNOSIS — Z90411 Acquired partial absence of pancreas: Secondary | ICD-10-CM | POA: Diagnosis not present

## 2021-02-16 NOTE — Progress Notes (Signed)
Hematology/Oncology Consult note [facility]  [phone] [phone]  Patient Care Team: Cletis Athens, MD as PCP - General (Internal Medicine) Grant Fontana, [place] as Referring Physician (Chiropractic Medicine) Christene Lye, MD (General Surgery) Sindy Guadeloupe, MD as Consulting Physician (Hematology and Oncology)   Name of the patient: Sheryl Perez  [id number]  01/29/1961   Date of visit: 02/16/21  Diagnosis-pancreatic adenocarcinoma stage II apT2 N1 M0  Chief complaint/ Reason for visit-discuss pathology results and further management  Heme/Onc history: patient is a 61 year old female with a history of irritable bowel disease for which she follows up with [facility].  She has undergone EGD and colonoscopy with them in the past.  More recently patient was complaining of left upper quadrant and epigastric abdominal pain which causes sudden episodes of abdominal spasms and was prescribed as needed tramadol for it.  These episodes of pain are unrelated to food and are dull aching or throbbing.  She has undergone cholecystectomy in the past.  She underwent CT abdomen and pelvis with contrast in the ER on 08/04/2020 Which did not reveal any acute pathology which showed a possible hypoechoic 1.9 cm right hepatic lobe lesion.  This was followed by an MRI which showed no discrete lesion in the liver and the area of concern noted on CT scan was more likely to represent variable fat deposition.  However there was loss of normal T1 signal in the peripheral pancreas and a potential small pancreatic lesion suspicious for early pancreatic adenocarcinoma in the neck of the pancreas.     Patient underwent EUS by Dr. Mont Dutton which showed an irregular mass in the pancreatic neck measuring 15 x 11 mm.  FNA showed adenocarcinoma.  Pancreatic duct measured 1.1 mm in the head with abrupt dilatation in the region of the mass to 4.1 mm.  No abnormality noted in the  common bile duct and hepatic duct.  Celiac region showed no significant endosonographic abnormality.  No lymphadenopathy was seen.  CA 19-9 mildly elevated at 35   Patient was seen by Dr. Hyman Hopes from pancreaticobiliary surgery at [facility] for surgical opinion.  Although patient has a small lesion involving the pancreatic neck, there was a concern for tumor extension towards the splenic vein and narrowing of portal splenic confluence.  Patient received 3 months of neoadjuvant modified FOLFIRINOX chemotherapyAnd then underwent definitive Whipple surgery at [facility] with Dr. Hyman Hopes on 01/13/2021.  Final pathology showed 2.3 cm grade 2 moderately differentiated adenocarcinoma with lymphovascular and perineural invasion.  Treatment effect present with residual cancer showing evident tumor regression but more than single cells or rare small group of cancer cells] partial response.  Margins negative.  1 out of 22 lymph nodes positive for malignancy.  Interval history-patient is recovering well from surgery.  She still has aSmall midline wound which is draining serous discharge.  Her weight was down to 104 pounds after surgery and states that it is slowly up to 112 pounds today  ECOG PS- 1 Pain scale- 2 Opioid associated constipation- no  Review of systems- Review of Systems  Constitutional:  Positive for malaise/fatigue. Negative for chills, fever and weight loss.  HENT:  Negative for congestion, ear discharge and nosebleeds.   Eyes:  Negative for blurred vision.  Respiratory:  Negative for cough, hemoptysis, sputum production, shortness of breath and wheezing.   Cardiovascular:  Negative for chest pain, palpitations, orthopnea and claudication.  Gastrointestinal:  Negative for abdominal pain, blood in stool, constipation, diarrhea,  heartburn, melena, nausea and vomiting.  Genitourinary:  Negative for dysuria, flank pain, frequency, hematuria and urgency.  Musculoskeletal:  Negative for back pain, joint pain  and myalgias.  Skin:  Negative for rash.  Neurological:  Negative for dizziness, tingling, focal weakness, seizures, weakness and headaches.  Endo/Heme/Allergies:  Does not bruise/bleed easily.  Psychiatric/Behavioral:  Negative for depression and suicidal ideas. The patient does not have insomnia.      Allergies  Allergen Reactions   Sulfacetamide Sodium Swelling    Diarrhea,dizziness, tongue swelling   Other Other (See Comments) and Swelling    PT states that anything with the sides effects "may cause rash" she cannot take due to her psoriasis, it will make the patches on her hand so thick she can't use her hands.  Diarrhea,dizziness, tongue swelling   Oxycontin [Oxycodone Hcl] Nausea And Vomiting   Sulfa Antibiotics Swelling    Diarrhea,dizziness, tongue swelling   Abilify [Aripiprazole] Other (See Comments)   Cephalosporins Rash   Erythromycin Other (See Comments)    Abd. pain Abd. pain Other reaction(s): Other (See Comments) Abd. 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    Bipolar disorder (HCC)    Chest pain, unspecified    Chronic low back pain 10/23/2013   Chronic UTI    COPD (chronic obstructive pulmonary disease) (HCC)    Depression    Dysfunction of eustachian tube    Enlargement of lymph nodes    Family history of brain cancer    Family history of breast cancer    Family history of prostate cancer    History of Bell's palsy Right   Injury, other and unspecified, knee, leg, ankle, and foot    MRSA (methicillin resistant staph aureus) culture positive 07/11/2014   abscess   Other specified disease of hair and hair follicles    Pancreatic cancer ([place])    Pancreatic cancer ([place])    Rectal prolapse    Unspecified symptom associated with  female genital organs      Past Surgical History:  Procedure Laterality Date   ABDOMINAL HYSTERECTOMY     bladder tack  2010   CHOLECYSTECTOMY  2002   FOOT SURGERY Bilateral 2004   bunion   GANGLION CYST EXCISION  2002   INCISION AND DRAINAGE ABSCESS  04-16-15   PARTIAL HYSTERECTOMY  2010   uterus removed   PORTA CATH INSERTION N/A 09/11/2020   Procedure: PORTA CATH INSERTION;  Surgeon: Algernon Huxley, MD;  Location: [facility];  Service: Cardiovascular;  Laterality: N/A;    Social History   Socioeconomic History   Marital status: Married    Spouse name: Not on file   Number of children: 3   Years of education: hs   Highest education level: Some college, no degree  Occupational History   Not on file  Tobacco Use   Smoking status: Former    Packs/day: 0.50    Years: 40.00    Pack years: 20.00    Types: Cigarettes    Quit date: 07/14/2020    Years since quitting: 0.5   Smokeless tobacco: Never  Vaping Use   Vaping Use: Never used  Substance and Sexual Activity   Alcohol use: No  Alcohol/week: 1.0 standard drink    Types: 1 Glasses of wine per week    Comment: occasional   Drug use: Yes    Types: Marijuana    Comment: last used 05-20-15   Sexual activity: Yes    Birth control/protection: None  Other Topics Concern   Not on file  Social History Narrative   Patient lives at home with her husband [name]).   Patient works full time.   Education CNA    Right handed.   Caffeine Tea , Soda.   Social Determinants of Health   Financial Resource Strain: Not on file  Food Insecurity: Not on file  Transportation Needs: Not on file  Physical Activity: Not on file  Stress: Not on file  Social Connections: Not on file  Intimate Partner Violence: Not on file    Family History  Problem Relation Age of Onset   Diabetes Father    Cancer Father        brain tumor   Depression Father    Depression Sister    Alcohol abuse Brother    Depression Brother     Leukemia Maternal Aunt    Prostate cancer Maternal Uncle    Breast cancer Paternal Aunt        dx >50   Heart disease Maternal Grandmother    Heart attack Maternal Grandmother    Stroke Maternal Grandmother    Parkinson's disease Maternal Grandfather    Leukemia Paternal Grandmother    Diabetes Paternal Grandfather    Breast cancer Cousin    Brain cancer Niece        dx under 53     Current Outpatient Medications:    Ascorbic Acid (VITAMIN C) 1000 MG tablet, Take 1,000 mg by mouth daily., Disp: , Rfl:    divalproex (DEPAKOTE) 250 MG DR tablet, Take 1 tablet (250 mg total) by mouth 2 (two) times daily., Disp: 180 tablet, Rfl: 1   famotidine (PEPCID) 20 MG tablet, Take 20 mg by mouth daily., Disp: , Rfl:    ferrous sulfate 325 (65 FE) MG tablet, Take 325 mg by mouth 2 (two) times daily with a meal., Disp: , Rfl:    loperamide (IMODIUM A-D) 2 MG tablet, Take 2 at onset of diarrhea, then 1 every 2hrs until 12hr without a BM. May take 2 tab every 4hrs at bedtime. If diarrhea recurs repeat., Disp: 100 tablet, Rfl: 1   loratadine (CLARITIN) 10 MG tablet, TAKE 1 TABLET BY MOUTH EVERY DAY, Disp: 90 tablet, Rfl: 1   Multiple Vitamin (MULTIVITAMIN) tablet, Take 1 tablet by mouth daily., Disp: , Rfl:    naproxen sodium (ALEVE) 220 MG tablet, Take 220 mg by mouth daily as needed., Disp: , Rfl:    OLANZapine (ZYPREXA) 10 MG tablet, TAKE 1 TABLET BY MOUTH EVERYDAY AT BEDTIME, Disp: 90 tablet, Rfl: 1   potassium chloride SA (KLOR-CON M20) 20 MEQ tablet, Take 1 tablet (20 mEq total) by mouth 2 (two) times daily., Disp: 14 tablet, Rfl: 0   propranolol (INDERAL) 20 MG tablet, Take 1 tablet (20 mg total) by mouth 3 (three) times daily., Disp: 270 tablet, Rfl: 1   ALPRAZolam (XANAX) 0.5 MG tablet, Take 1 tablet (0.5 mg total) by mouth 2 (two) times daily as needed for anxiety. (Patient not taking: Reported on 11/25/2020), Disp: 20 tablet, Rfl: 1   dexamethasone (DECADRON) 4 MG tablet, Take 2 tablets (8 mg  total) by mouth daily. Start the day after chemotherapy for 3 days. Take  with food. (Patient not taking: Reported on 02/16/2021), Disp: 8 tablet, Rfl: 5   fluticasone (FLONASE) 50 MCG/ACT nasal spray, Place 1 spray into both nostrils daily. (Patient not taking: Reported on 02/16/2021), Disp: 11.1 mL, Rfl: 2   ondansetron (ZOFRAN) 8 MG tablet, Take 1 tablet (8 mg total) by mouth 2 (two) times daily as needed. Start on day 3 after chemotherapy. (Patient not taking: Reported on 12/12/2020), Disp: 30 tablet, Rfl: 1   prochlorperazine (COMPAZINE) 10 MG tablet, Take 1 tablet (10 mg total) by mouth every 6 (six) hours as needed (Nausea or vomiting). (Patient not taking: Reported on 12/12/2020), Disp: 30 tablet, Rfl: 1   QUEtiapine (SEROQUEL) 50 MG tablet, Take 1 tablet (50 mg total) by mouth at bedtime as needed. (Patient not taking: Reported on 12/12/2020), Disp: 90 tablet, Rfl: 0 No current facility-administered medications for this visit.  Facility-Administered Medications Ordered in Other Visits:    heparin lock flush 100 UNIT/ML injection, , , ,    sodium chloride flush (NS) 0.9 % injection 10 mL, 10 mL, Intracatheter, PRN, Sindy Guadeloupe, MD, 10 mL at 11/03/20 1441  Physical exam:  Vitals:   02/16/21 0936  BP: (!) 120/47  Pulse: 69  Resp: 16  Temp: 97.6 F (36.4 C)  TempSrc: Tympanic  SpO2: 100%  Weight: 112 lb 4.8 oz (50.9 kg)  Height: 5' (1.524 m)   Physical Exam Constitutional:      General: She is not in acute distress.    Comments: Patient appears thin and frail.  In no acute distress  Cardiovascular:     Rate and Rhythm: Normal rate and regular rhythm.     Heart sounds: Normal heart sounds.  Pulmonary:     Effort: Pulmonary effort is normal.     Breath sounds: Normal breath sounds.  Abdominal:     General: Bowel sounds are normal.     Palpations: Abdomen is soft.     Comments: Midline surgical scar of whipples seen  Skin:    General: Skin is warm and dry.  Neurological:      Mental Status: She is alert and oriented to person, place, and time.     CMP Latest Ref Rng & Units 02/13/2021  Glucose 70 - 99 mg/dL 136(H)  BUN 6 - 20 mg/dL 11  Creatinine 0.44 - 1.00 mg/dL 0.99  Sodium 135 - 145 mmol/L 135  Potassium 3.5 - 5.1 mmol/L 2.9(L)  Chloride 98 - 111 mmol/L 101  CO2 22 - 32 mmol/L 25  Calcium 8.9 - 10.3 mg/dL 8.9  Total Protein 6.5 - 8.1 g/dL 7.4  Total Bilirubin 0.3 - 1.2 mg/dL 0.4  Alkaline Phos 38 - 126 U/L 104  AST 15 - 41 U/L 33  ALT 0 - 44 U/L 23   CBC Latest Ref Rng & Units 02/13/2021  WBC 4.0 - 10.5 K/uL 6.6  Hemoglobin 12.0 - 15.0 g/dL 11.0(L)  Hematocrit 36.0 - 46.0 % 32.3(L)  Platelets 150 - 400 K/uL 274     Assessment and plan- Patient is a 61 y.o. female with pancreatic adenocarcinoma s/p 3 months of neoadjuvant modified FOLFIRINOX chemotherapy s/p Whipple's.  Final pathology showed T2N1 grade 2 disease.  She is here to discuss further management  Patient's midline surgical wound is healing well and there is only a small opening with minimal drainage seen.  Her anemia has also improved after a break from chemotherapy and is up to11 today.  It was down to 9.42 months ago.  CA  19-9 is normal.  We will plan to restart modified FOLFIRINOX chemotherapy in 1 week's time.  Plan is to do 6 cycles if she can tolerate it  Hypokalemia: She will restart oral potassium   Visit Diagnosis 1. Pancreatic adenocarcinoma ([place])   2. Goals of care, counseling/discussion      Dr. Randa Evens, MD, MPH [facility] [phone] 02/16/2021 1:10 PM

## 2021-02-16 NOTE — Telephone Encounter (Addendum)
02/16/21- Received incoming form Short term disability/[employer] Request form completed. Pt dropped off forms at her visit today with Dr. Janese Banks. She needs continuous leave through at least 05/12/21. Last treatment will be at the end of March.  Form completed- pending Dr. Elroy Channel signature.

## 2021-02-16 NOTE — Telephone Encounter (Signed)
Pt called and wants to start her chemo next week. Wants to talk to [employer]. Call back at [phone]

## 2021-02-17 ENCOUNTER — Encounter: Payer: Self-pay | Admitting: Oncology

## 2021-02-17 NOTE — Telephone Encounter (Signed)
Signed Form faxed to [employer]. Copy sent to be scanned into pt's chart. Fax confirmation [email or web address].

## 2021-02-27 ENCOUNTER — Other Ambulatory Visit: Payer: Self-pay | Admitting: Oncology

## 2021-02-27 ENCOUNTER — Inpatient Hospital Stay: Payer: BC Managed Care – PPO

## 2021-02-27 ENCOUNTER — Other Ambulatory Visit: Payer: Self-pay

## 2021-02-27 VITALS — BP 116/44 | HR 73 | Temp 97.5°F | Resp 20 | Wt 112.4 lb

## 2021-02-27 DIAGNOSIS — Z87891 Personal history of nicotine dependence: Secondary | ICD-10-CM | POA: Diagnosis not present

## 2021-02-27 DIAGNOSIS — C259 Malignant neoplasm of pancreas, unspecified: Secondary | ICD-10-CM | POA: Diagnosis not present

## 2021-02-27 DIAGNOSIS — C257 Malignant neoplasm of other parts of pancreas: Secondary | ICD-10-CM | POA: Diagnosis not present

## 2021-02-27 DIAGNOSIS — D649 Anemia, unspecified: Secondary | ICD-10-CM | POA: Diagnosis not present

## 2021-02-27 DIAGNOSIS — Z882 Allergy status to sulfonamides status: Secondary | ICD-10-CM | POA: Diagnosis not present

## 2021-02-27 DIAGNOSIS — F319 Bipolar disorder, unspecified: Secondary | ICD-10-CM | POA: Diagnosis not present

## 2021-02-27 DIAGNOSIS — Z90411 Acquired partial absence of pancreas: Secondary | ICD-10-CM | POA: Diagnosis not present

## 2021-02-27 DIAGNOSIS — Z5189 Encounter for other specified aftercare: Secondary | ICD-10-CM | POA: Diagnosis not present

## 2021-02-27 DIAGNOSIS — Z5111 Encounter for antineoplastic chemotherapy: Secondary | ICD-10-CM | POA: Diagnosis not present

## 2021-02-27 DIAGNOSIS — E876 Hypokalemia: Secondary | ICD-10-CM | POA: Diagnosis not present

## 2021-02-27 DIAGNOSIS — Z79899 Other long term (current) drug therapy: Secondary | ICD-10-CM | POA: Diagnosis not present

## 2021-02-27 DIAGNOSIS — Z888 Allergy status to other drugs, medicaments and biological substances status: Secondary | ICD-10-CM | POA: Diagnosis not present

## 2021-02-27 DIAGNOSIS — Z7952 Long term (current) use of systemic steroids: Secondary | ICD-10-CM | POA: Diagnosis not present

## 2021-02-27 DIAGNOSIS — Z885 Allergy status to narcotic agent status: Secondary | ICD-10-CM | POA: Diagnosis not present

## 2021-02-27 DIAGNOSIS — R5383 Other fatigue: Secondary | ICD-10-CM | POA: Diagnosis not present

## 2021-02-27 DIAGNOSIS — Z87440 Personal history of urinary (tract) infections: Secondary | ICD-10-CM | POA: Diagnosis not present

## 2021-02-27 DIAGNOSIS — Z881 Allergy status to other antibiotic agents status: Secondary | ICD-10-CM | POA: Diagnosis not present

## 2021-02-27 LAB — COMPREHENSIVE METABOLIC PANEL
ALT: 20 U/L (ref 0–44)
AST: 29 U/L (ref 15–41)
Albumin: 3.9 g/dL (ref 3.5–5.0)
Alkaline Phosphatase: 106 U/L (ref 38–126)
Anion gap: 8 (ref 5–15)
BUN: 14 mg/dL (ref 6–20)
CO2: 27 mmol/L (ref 22–32)
Calcium: 9.5 mg/dL (ref 8.9–10.3)
Chloride: 106 mmol/L (ref 98–111)
Creatinine, Ser: 0.98 mg/dL (ref 0.44–1.00)
GFR, Estimated: >60 mL/min (ref >60)
Glucose, Bld: 133 mg/dL — ABNORMAL HIGH (ref 70–99)
Potassium: 3.8 mmol/L (ref 3.5–5.1)
Sodium: 141 mmol/L (ref 135–145)
Total Bilirubin: 0.2 mg/dL — ABNORMAL LOW (ref 0.3–1.2)
Total Protein: 7.4 g/dL (ref 6.5–8.1)

## 2021-02-27 LAB — CBC WITH DIFFERENTIAL/PLATELET
Abs Immature Granulocytes: 0.01 10*3/uL (ref 0.00–0.07)
Basophils Absolute: 0 10*3/uL (ref 0.0–0.1)
Basophils Relative: 0 %
Eosinophils Absolute: 0.2 10*3/uL (ref 0.0–0.5)
Eosinophils Relative: 3 %
HCT: 33.2 % — ABNORMAL LOW (ref 36.0–46.0)
Hemoglobin: 10.9 g/dL — ABNORMAL LOW (ref 12.0–15.0)
Immature Granulocytes: 0 %
Lymphocytes Relative: 38 %
Lymphs Abs: 2 10*3/uL (ref 0.7–4.0)
MCH: 33.7 pg (ref 26.0–34.0)
MCHC: 32.8 g/dL (ref 30.0–36.0)
MCV: 102.8 fL — ABNORMAL HIGH (ref 80.0–100.0)
Monocytes Absolute: 0.4 10*3/uL (ref 0.1–1.0)
Monocytes Relative: 7 %
Neutro Abs: 2.7 10*3/uL (ref 1.7–7.7)
Neutrophils Relative %: 52 %
Platelets: 239 10*3/uL (ref 150–400)
RBC: 3.23 MIL/uL — ABNORMAL LOW (ref 3.87–5.11)
RDW: 11.9 % (ref 11.5–15.5)
WBC: 5.3 10*3/uL (ref 4.0–10.5)
nRBC: 0 % (ref 0.0–0.2)

## 2021-02-27 MED ORDER — SODIUM CHLORIDE 0.9 % IV SOLN
10.0000 mg | Freq: Once | INTRAVENOUS | Status: AC
  Administered 2021-02-27: 10 mg via INTRAVENOUS
  Filled 2021-02-27: qty 10

## 2021-02-27 MED ORDER — SODIUM CHLORIDE 0.9 % IV SOLN
600.0000 mg | Freq: Once | INTRAVENOUS | Status: AC
  Administered 2021-02-27: 600 mg via INTRAVENOUS
  Filled 2021-02-27: qty 25

## 2021-02-27 MED ORDER — SODIUM CHLORIDE 0.9% FLUSH
10.0000 mL | INTRAVENOUS | Status: DC | PRN
  Filled 2021-02-27: qty 10

## 2021-02-27 MED ORDER — POTASSIUM CHLORIDE CRYS ER 20 MEQ PO TBCR: 20.0000 meq | EXTENDED_RELEASE_TABLET | Freq: Two times a day (BID) | ORAL | 0 refills | Status: DC

## 2021-02-27 MED ORDER — PALONOSETRON HCL INJECTION 0.25 MG/5ML
0.2500 mg | Freq: Once | INTRAVENOUS | Status: AC
  Administered 2021-02-27: 0.25 mg via INTRAVENOUS
  Filled 2021-02-27: qty 5

## 2021-02-27 MED ORDER — SODIUM CHLORIDE 0.9 % IV SOLN
150.0000 mg/m2 | Freq: Once | INTRAVENOUS | Status: AC
  Administered 2021-02-27: 220 mg via INTRAVENOUS
  Filled 2021-02-27: qty 10

## 2021-02-27 MED ORDER — OXALIPLATIN CHEMO INJECTION 100 MG/20ML
65.0000 mg/m2 | Freq: Once | INTRAVENOUS | Status: AC
  Administered 2021-02-27: 100 mg via INTRAVENOUS
  Filled 2021-02-27: qty 20

## 2021-02-27 MED ORDER — DEXTROSE 5 % IV SOLN
Freq: Once | INTRAVENOUS | Status: AC
  Filled 2021-02-27: qty 250

## 2021-02-27 MED ORDER — ATROPINE SULFATE 1 MG/ML IV SOLN
0.5000 mg | Freq: Once | INTRAVENOUS | Status: AC | PRN
  Administered 2021-02-27: 0.5 mg via INTRAVENOUS
  Filled 2021-02-27: qty 1

## 2021-02-27 MED ORDER — SODIUM CHLORIDE 0.9 % IV SOLN
2400.0000 mg/m2 | INTRAVENOUS | Status: DC
  Administered 2021-02-27: 3600 mg via INTRAVENOUS
  Filled 2021-02-27: qty 72

## 2021-02-27 MED ORDER — SODIUM CHLORIDE 0.9 % IV SOLN
150.0000 mg | Freq: Once | INTRAVENOUS | Status: AC
  Administered 2021-02-27: 150 mg via INTRAVENOUS
  Filled 2021-02-27: qty 150

## 2021-02-27 NOTE — Patient Instructions (Signed)
[facility] AT [place]  Discharge Instructions: Thank you for choosing [facility] to provide your oncology and hematology care.  If you have a lab appointment with the [facility], please go directly to the [facility] and check in at the registration area.  Wear comfortable clothing and clothing appropriate for easy access to any Portacath or PICC line.   We strive to give you quality time with your provider. You may need to reschedule your appointment if you arrive late (15 or more minutes).  Arriving late affects you and other patients whose appointments are after yours.  Also, if you miss three or more appointments without notifying the office, you may be dismissed from the clinic at the providers discretion.      For prescription refill requests, have your pharmacy contact our office and allow 72 hours for refills to be completed.    Today you received the following chemotherapy and/or immunotherapy agents: FOLFIRINOX     To help prevent nausea and vomiting after your treatment, we encourage you to take your nausea medication as directed.  BELOW ARE SYMPTOMS THAT SHOULD BE REPORTED IMMEDIATELY: *FEVER GREATER THAN 100.4 F (38 C) OR HIGHER *CHILLS OR SWEATING *NAUSEA AND VOMITING THAT IS NOT CONTROLLED WITH YOUR NAUSEA MEDICATION *UNUSUAL SHORTNESS OF BREATH *UNUSUAL BRUISING OR BLEEDING *URINARY PROBLEMS (pain or burning when urinating, or frequent urination) *BOWEL PROBLEMS (unusual diarrhea, constipation, pain near the anus) TENDERNESS IN MOUTH AND THROAT WITH OR WITHOUT PRESENCE OF ULCERS (sore throat, sores in mouth, or a toothache) UNUSUAL RASH, SWELLING OR PAIN  UNUSUAL VAGINAL DISCHARGE OR ITCHING   Items with * indicate a potential emergency and should be followed up as soon as possible or go to the Emergency Department if any problems should occur.  Please show the CHEMOTHERAPY ALERT CARD or IMMUNOTHERAPY ALERT CARD at check-in to  the Emergency Department and triage nurse.  Should you have questions after your visit or need to cancel or reschedule your appointment, please contact [facility] [place] AT [place]  [phone] and follow the prompts.  Office hours are 8:00 a.m. to 4:30 p.m. Monday - Friday. Please note that voicemails left after 4:00 p.m. may not be returned until the following business day.  We are closed weekends and major holidays. You have access to a nurse at all times for urgent questions. Please call the main number to the clinic [phone] and follow the prompts.  For any non-urgent questions, you may also contact your provider using MyChart. We now offer e-Visits for anyone 81 and older to request care online for non-urgent symptoms. For details visit [email or web address].   Also download the MyChart app! Go to the app store, search "MyChart", open the app, select [facility], and log in with your MyChart username and password.  Due to Covid, a mask is required upon entering the hospital/clinic. If you do not have a mask, one will be given to you upon arrival. For doctor visits, patients may have 1 support person aged 24 or older with them. For treatment visits, patients cannot have anyone with them due to current Covid guidelines and our immunocompromised population. Fluorouracil, 5-FU injection What is this medication? FLUOROURACIL, 5-FU (flure oh YOOR a sil) is a chemotherapy drug. It slows the growth of cancer cells. This medicine is used to treat many types of cancer like breast cancer, colon or rectal cancer, pancreatic cancer, and stomach cancer. This medicine may be used for other purposes; ask your health  care provider or pharmacist if you have questions. COMMON BRAND NAME(S): Adrucil What should I tell my care team before I take this medication? They need to know if you have any of these conditions: blood disorders dihydropyrimidine dehydrogenase (DPD) deficiency infection  (especially a virus infection such as chickenpox, cold sores, or herpes) kidney disease liver disease malnourished, poor nutrition recent or ongoing radiation therapy an unusual or allergic reaction to fluorouracil, other chemotherapy, other medicines, foods, dyes, or preservatives pregnant or trying to get pregnant breast-feeding How should I use this medication? This drug is given as an infusion or injection into a vein. It is administered in a hospital or clinic by a specially trained health care professional. Talk to your pediatrician regarding the use of this medicine in children. Special care may be needed. Overdosage: If you think you have taken too much of this medicine contact a poison control center or emergency room at once. NOTE: This medicine is only for you. Do not share this medicine with others. What if I miss a dose? It is important not to miss your dose. Call your doctor or health care professional if you are unable to keep an appointment. What may interact with this medication? Do not take this medicine with any of the following medications: live virus vaccines This medicine may also interact with the following medications: medicines that treat or prevent blood clots like warfarin, enoxaparin, and dalteparin This list may not describe all possible interactions. Give your health care provider a list of all the medicines, herbs, non-prescription drugs, or dietary supplements you use. Also tell them if you smoke, drink alcohol, or use illegal drugs. Some items may interact with your medicine. What should I watch for while using this medication? Visit your doctor for checks on your progress. This drug may make you feel generally unwell. This is not uncommon, as chemotherapy can affect healthy cells as well as cancer cells. Report any side effects. Continue your course of treatment even though you feel ill unless your doctor tells you to stop. In some cases, you may be given  additional medicines to help with side effects. Follow all directions for their use. Call your doctor or health care professional for advice if you get a fever, chills or sore throat, or other symptoms of a cold or flu. Do not treat yourself. This drug decreases your body's ability to fight infections. Try to avoid being around people who are sick. This medicine may increase your risk to bruise or bleed. Call your doctor or health care professional if you notice any unusual bleeding. Be careful brushing and flossing your teeth or using a toothpick because you may get an infection or bleed more easily. If you have any dental work done, tell your dentist you are receiving this medicine. Avoid taking products that contain aspirin, acetaminophen, ibuprofen, naproxen, or ketoprofen unless instructed by your doctor. These medicines may hide a fever. Do not become pregnant while taking this medicine. Women should inform their doctor if they wish to become pregnant or think they might be pregnant. There is a potential for serious side effects to an unborn child. Talk to your health care professional or pharmacist for more information. Do not breast-feed an infant while taking this medicine. Men should inform their doctor if they wish to father a child. This medicine may lower sperm counts. Do not treat diarrhea with over the counter products. Contact your doctor if you have diarrhea that lasts more than 2 days or if  it is severe and watery. This medicine can make you more sensitive to the sun. Keep out of the sun. If you cannot avoid being in the sun, wear protective clothing and use sunscreen. Do not use sun lamps or tanning beds/booths. What side effects may I notice from receiving this medication? Side effects that you should report to your doctor or health care professional as soon as possible: allergic reactions like skin rash, itching or hives, swelling of the face, lips, or tongue low blood counts - this  medicine may decrease the number of white blood cells, red blood cells and platelets. You may be at increased risk for infections and bleeding. signs of infection - fever or chills, cough, sore throat, pain or difficulty passing urine signs of decreased platelets or bleeding - bruising, pinpoint red spots on the skin, black, tarry stools, blood in the urine signs of decreased red blood cells - unusually weak or tired, fainting spells, lightheadedness breathing problems changes in vision chest pain mouth sores nausea and vomiting pain, swelling, redness at site where injected pain, tingling, numbness in the hands or feet redness, swelling, or sores on hands or feet stomach pain unusual bleeding Side effects that usually do not require medical attention (report to your doctor or health care professional if they continue or are bothersome): changes in finger or toe nails diarrhea dry or itchy skin hair loss headache loss of appetite sensitivity of eyes to the light stomach upset unusually teary eyes This list may not describe all possible side effects. Call your doctor for medical advice about side effects. You may report side effects to FDA at 1-800-FDA-1088. Where should I keep my medication? This drug is given in a hospital or clinic and will not be stored at home. NOTE: This sheet is a summary. It may not cover all possible information. If you have questions about this medicine, talk to your doctor, pharmacist, or health care provider.  2022 Elsevier/Gold Standard (2020-10-14 00:00:00) Leucovorin injection What is this medication? LEUCOVORIN (loo koe VOR in) is used to prevent or treat the harmful effects of some medicines. This medicine is used to treat anemia caused by a low amount of folic acid in the body. It is also used with 5-fluorouracil (5-FU) to treat colon cancer. This medicine may be used for other purposes; ask your health care provider or pharmacist if you have  questions. What should I tell my care team before I take this medication? They need to know if you have any of these conditions: anemia from low levels of vitamin B-12 in the blood an unusual or allergic reaction to leucovorin, folic acid, other medicines, foods, dyes, or preservatives pregnant or trying to get pregnant breast-feeding How should I use this medication? This medicine is for injection into a muscle or into a vein. It is given by a health care professional in a hospital or clinic setting. Talk to your pediatrician regarding the use of this medicine in children. Special care may be needed. Overdosage: If you think you have taken too much of this medicine contact a poison control center or emergency room at once. NOTE: This medicine is only for you. Do not share this medicine with others. What if I miss a dose? This does not apply. What may interact with this medication? capecitabine fluorouracil phenobarbital phenytoin primidone trimethoprim-sulfamethoxazole This list may not describe all possible interactions. Give your health care provider a list of all the medicines, herbs, non-prescription drugs, or dietary supplements you use. Also  tell them if you smoke, drink alcohol, or use illegal drugs. Some items may interact with your medicine. What should I watch for while using this medication? Your condition will be monitored carefully while you are receiving this medicine. This medicine may increase the side effects of 5-fluorouracil, 5-FU. Tell your doctor or health care professional if you have diarrhea or mouth sores that do not get better or that get worse. What side effects may I notice from receiving this medication? Side effects that you should report to your doctor or health care professional as soon as possible: allergic reactions like skin rash, itching or hives, swelling of the face, lips, or tongue breathing problems fever, infection mouth sores unusual bleeding  or bruising unusually weak or tired Side effects that usually do not require medical attention (report to your doctor or health care professional if they continue or are bothersome): constipation or diarrhea loss of appetite nausea, vomiting This list may not describe all possible side effects. Call your doctor for medical advice about side effects. You may report side effects to FDA at 1-800-FDA-1088. Where should I keep my medication? This drug is given in a hospital or clinic and will not be stored at home. NOTE: This sheet is a summary. It may not cover all possible information. If you have questions about this medicine, talk to your doctor, pharmacist, or health care provider.  2022 Elsevier/Gold Standard (2007-08-03 00:00:00) Irinotecan injection What is this medication? IRINOTECAN (ir in oh TEE kan ) is a chemotherapy drug. It is used to treat colon and rectal cancer. This medicine may be used for other purposes; ask your health care provider or pharmacist if you have questions. COMMON BRAND NAME(S): Camptosar What should I tell my care team before I take this medication? They need to know if you have any of these conditions: dehydration diarrhea infection (especially a virus infection such as chickenpox, cold sores, or herpes) liver disease low blood counts, like low white cell, platelet, or red cell counts low levels of calcium, magnesium, or potassium in the blood recent or ongoing radiation therapy an unusual or allergic reaction to irinotecan, other medicines, foods, dyes, or preservatives pregnant or trying to get pregnant breast-feeding How should I use this medication? This drug is given as an infusion into a vein. It is administered in a hospital or clinic by a specially trained health care professional. Talk to your pediatrician regarding the use of this medicine in children. Special care may be needed. Overdosage: If you think you have taken too much of this medicine  contact a poison control center or emergency room at once. NOTE: This medicine is only for you. Do not share this medicine with others. What if I miss a dose? It is important not to miss your dose. Call your doctor or health care professional if you are unable to keep an appointment. What may interact with this medication? Do not take this medicine with any of the following medications: cobicistat itraconazole This medicine may interact with the following medications: antiviral medicines for HIV or AIDS certain antibiotics like rifampin or rifabutin certain medicines for fungal infections like ketoconazole, posaconazole, and voriconazole certain medicines for seizures like carbamazepine, phenobarbital, phenotoin clarithromycin gemfibrozil nefazodone St. John's Wort This list may not describe all possible interactions. Give your health care provider a list of all the medicines, herbs, non-prescription drugs, or dietary supplements you use. Also tell them if you smoke, drink alcohol, or use illegal drugs. Some items may interact with  your medicine. What should I watch for while using this medication? Your condition will be monitored carefully while you are receiving this medicine. You will need important blood work done while you are taking this medicine. This drug may make you feel generally unwell. This is not uncommon, as chemotherapy can affect healthy cells as well as cancer cells. Report any side effects. Continue your course of treatment even though you feel ill unless your doctor tells you to stop. In some cases, you may be given additional medicines to help with side effects. Follow all directions for their use. You may get drowsy or dizzy. Do not drive, use machinery, or do anything that needs mental alertness until you know how this medicine affects you. Do not stand or sit up quickly, especially if you are an older patient. This reduces the risk of dizzy or fainting spells. Call your  health care professional for advice if you get a fever, chills, or sore throat, or other symptoms of a cold or flu. Do not treat yourself. This medicine decreases your body's ability to fight infections. Try to avoid being around people who are sick. Avoid taking products that contain aspirin, acetaminophen, ibuprofen, naproxen, or ketoprofen unless instructed by your doctor. These medicines may hide a fever. This medicine may increase your risk to bruise or bleed. Call your doctor or health care professional if you notice any unusual bleeding. Be careful brushing and flossing your teeth or using a toothpick because you may get an infection or bleed more easily. If you have any dental work done, tell your dentist you are receiving this medicine. Do not become pregnant while taking this medicine or for 6 months after stopping it. Women should inform their health care professional if they wish to become pregnant or think they might be pregnant. Men should not father a child while taking this medicine and for 3 months after stopping it. There is potential for serious side effects to an unborn child. Talk to your health care professional for more information. Do not breast-feed an infant while taking this medicine or for 7 days after stopping it. This medicine has caused ovarian failure in some women. This medicine may make it more difficult to get pregnant. Talk to your health care professional if you are concerned about your fertility. This medicine has caused decreased sperm counts in some men. This may make it more difficult to father a child. Talk to your health care professional if you are concerned about your fertility. What side effects may I notice from receiving this medication? Side effects that you should report to your doctor or health care professional as soon as possible: allergic reactions like skin rash, itching or hives, swelling of the face, lips, or tongue chest pain diarrhea flushing,  runny nose, sweating during infusion low blood counts - this medicine may decrease the number of white blood cells, red blood cells and platelets. You may be at increased risk for infections and bleeding. nausea, vomiting pain, swelling, warmth in the leg signs of decreased platelets or bleeding - bruising, pinpoint red spots on the skin, black, tarry stools, blood in the urine signs of infection - fever or chills, cough, sore throat, pain or difficulty passing urine signs of decreased red blood cells - unusually weak or tired, fainting spells, lightheadedness Side effects that usually do not require medical attention (report to your doctor or health care professional if they continue or are bothersome): constipation hair loss headache loss of appetite mouth sores  stomach pain This list may not describe all possible side effects. Call your doctor for medical advice about side effects. You may report side effects to FDA at 1-800-FDA-1088. Where should I keep my medication? This drug is given in a hospital or clinic and will not be stored at home. NOTE: This sheet is a summary. It may not cover all possible information. If you have questions about this medicine, talk to your doctor, pharmacist, or health care provider.  2022 Elsevier/Gold Standard (2020-10-14 00:00:00) Oxaliplatin Injection What is this medication? OXALIPLATIN (ox AL i PLA tin) is a chemotherapy drug. It targets fast dividing cells, like cancer cells, and causes these cells to die. This medicine is used to treat cancers of the colon and rectum, and many other cancers. This medicine may be used for other purposes; ask your health care provider or pharmacist if you have questions. COMMON BRAND NAME(S): Eloxatin What should I tell my care team before I take this medication? They need to know if you have any of these conditions: heart disease history of irregular heartbeat liver disease low blood counts, like white cells,  platelets, or red blood cells lung or breathing disease, like asthma take medicines that treat or prevent blood clots tingling of the fingers or toes, or other nerve disorder an unusual or allergic reaction to oxaliplatin, other chemotherapy, other medicines, foods, dyes, or preservatives pregnant or trying to get pregnant breast-feeding How should I use this medication? This drug is given as an infusion into a vein. It is administered in a hospital or clinic by a specially trained health care professional. Talk to your pediatrician regarding the use of this medicine in children. Special care may be needed. Overdosage: If you think you have taken too much of this medicine contact a poison control center or emergency room at once. NOTE: This medicine is only for you. Do not share this medicine with others. What if I miss a dose? It is important not to miss a dose. Call your doctor or health care professional if you are unable to keep an appointment. What may interact with this medication? Do not take this medicine with any of the following medications: cisapride dronedarone pimozide thioridazine This medicine may also interact with the following medications: aspirin and aspirin-like medicines certain medicines that treat or prevent blood clots like warfarin, apixaban, dabigatran, and rivaroxaban cisplatin cyclosporine diuretics medicines for infection like acyclovir, adefovir, amphotericin B, bacitracin, cidofovir, foscarnet, ganciclovir, gentamicin, pentamidine, vancomycin NSAIDs, medicines for pain and inflammation, like ibuprofen or naproxen other medicines that prolong the QT interval (an abnormal heart rhythm) pamidronate zoledronic acid This list may not describe all possible interactions. Give your health care provider a list of all the medicines, herbs, non-prescription drugs, or dietary supplements you use. Also tell them if you smoke, drink alcohol, or use illegal drugs. Some  items may interact with your medicine. What should I watch for while using this medication? Your condition will be monitored carefully while you are receiving this medicine. You may need blood work done while you are taking this medicine. This medicine may make you feel generally unwell. This is not uncommon as chemotherapy can affect healthy cells as well as cancer cells. Report any side effects. Continue your course of treatment even though you feel ill unless your healthcare professional tells you to stop. This medicine can make you more sensitive to cold. Do not drink cold drinks or use ice. Cover exposed skin before coming in contact with cold temperatures or  cold objects. When out in cold weather wear warm clothing and cover your mouth and nose to warm the air that goes into your lungs. Tell your doctor if you get sensitive to the cold. Do not become pregnant while taking this medicine or for 9 months after stopping it. Women should inform their health care professional if they wish to become pregnant or think they might be pregnant. Men should not father a child while taking this medicine and for 6 months after stopping it. There is potential for serious side effects to an unborn child. Talk to your health care professional for more information. Do not breast-feed a child while taking this medicine or for 3 months after stopping it. This medicine has caused ovarian failure in some women. This medicine may make it more difficult to get pregnant. Talk to your health care professional if you are concerned about your fertility. This medicine has caused decreased sperm counts in some men. This may make it more difficult to father a child. Talk to your health care professional if you are concerned about your fertility. This medicine may increase your risk of getting an infection. Call your health care professional for advice if you get a fever, chills, or sore throat, or other symptoms of a cold or flu.  Do not treat yourself. Try to avoid being around people who are sick. Avoid taking medicines that contain aspirin, acetaminophen, ibuprofen, naproxen, or ketoprofen unless instructed by your health care professional. These medicines may hide a fever. Be careful brushing or flossing your teeth or using a toothpick because you may get an infection or bleed more easily. If you have any dental work done, tell your dentist you are receiving this medicine. What side effects may I notice from receiving this medication? Side effects that you should report to your doctor or health care professional as soon as possible: allergic reactions like skin rash, itching or hives, swelling of the face, lips, or tongue breathing problems cough low blood counts - this medicine may decrease the number of white blood cells, red blood cells, and platelets. You may be at increased risk for infections and bleeding nausea, vomiting pain, redness, or irritation at site where injected pain, tingling, numbness in the hands or feet signs and symptoms of bleeding such as bloody or black, tarry stools; red or dark brown urine; spitting up blood or brown material that looks like coffee grounds; red spots on the skin; unusual bruising or bleeding from the eyes, gums, or nose signs and symptoms of a dangerous change in heartbeat or heart rhythm like chest pain; dizziness; fast, irregular heartbeat; palpitations; feeling faint or lightheaded; falls signs and symptoms of infection like fever; chills; cough; sore throat; pain or trouble passing urine signs and symptoms of liver injury like dark yellow or brown urine; general ill feeling or flu-like symptoms; light-colored stools; loss of appetite; nausea; right upper belly pain; unusually weak or tired; yellowing of the eyes or skin signs and symptoms of low red blood cells or anemia such as unusually weak or tired; feeling faint or lightheaded; falls signs and symptoms of muscle injury  like dark urine; trouble passing urine or change in the amount of urine; unusually weak or tired; muscle pain; back pain Side effects that usually do not require medical attention (report to your doctor or health care professional if they continue or are bothersome): changes in taste diarrhea gas hair loss loss of appetite mouth sores This list may not describe all possible  side effects. Call your doctor for medical advice about side effects. You may report side effects to FDA at 1-800-FDA-1088. Where should I keep my medication? This drug is given in a hospital or clinic and will not be stored at home. NOTE: This sheet is a summary. It may not cover all possible information. If you have questions about this medicine, talk to your doctor, pharmacist, or health care provider.  2022 Elsevier/Gold Standard (2020-10-14 00:00:00)

## 2021-02-27 NOTE — Progress Notes (Signed)
Nutrition Follow-up:  Patient with stage I pancreatic cancer.  Patient completed neoadjuvant chemotherapy.  S/p whipple on 12/6 at [facility].  Patient starting adjuvant treatment of modified folfirinox.    Met with patient during infusion.  Patient reports that appetite is better, was down following surgery.  Has been eating 3 meals per day and snack before bed.  Ate jelly biscuit for breakfast yesterday, chicken with cream of chicken soup and vegetables for lunch yesterday. Supper last night was brunswick stew and cheese puffs for bedtime snack.  Drinking milk (chocolate).  Drinking boost shakes some but not as much as before surgery, says that they taste chalky.  Reports having good bowel movements, no diarrhea.      Medications: reviewed  Labs: reviewed  Anthropometrics:   Weight 112 lb 7 oz  115 lb 14.4 oz on 11/4 113 lb 14.4 oz on 10/7 113 lb 12.8 oz on 9/23 111 lb on 9/6 113 lb 4.8 oz on 8/19   NUTRITION DIAGNOSIS: Unintentional weight loss continues following surgery   INTERVENTION:  Reviewed foods to choose following whipple procedure and handout given from [facility]. Encouraged protein food at every meal/snack.  Wound still not healed completely.   Encouraged milk with meals for added nutrition. Will have to be mindful of cold items following treatment and patient aware.      MONITORING, EVALUATION, GOAL: weight trends, intake   NEXT VISIT: Friday, Feb 10th in infusion  Faraz Ponciano B. Zenia Resides, [place], [place] Registered Dietitian [phone] (mobile)

## 2021-03-02 ENCOUNTER — Other Ambulatory Visit: Payer: Self-pay

## 2021-03-02 ENCOUNTER — Inpatient Hospital Stay: Payer: BC Managed Care – PPO

## 2021-03-02 VITALS — BP 139/64 | HR 97 | Resp 18

## 2021-03-02 DIAGNOSIS — Z5111 Encounter for antineoplastic chemotherapy: Secondary | ICD-10-CM | POA: Diagnosis not present

## 2021-03-02 DIAGNOSIS — Z885 Allergy status to narcotic agent status: Secondary | ICD-10-CM | POA: Diagnosis not present

## 2021-03-02 DIAGNOSIS — Z90411 Acquired partial absence of pancreas: Secondary | ICD-10-CM | POA: Diagnosis not present

## 2021-03-02 DIAGNOSIS — Z87440 Personal history of urinary (tract) infections: Secondary | ICD-10-CM | POA: Diagnosis not present

## 2021-03-02 DIAGNOSIS — Z79899 Other long term (current) drug therapy: Secondary | ICD-10-CM | POA: Diagnosis not present

## 2021-03-02 DIAGNOSIS — Z5189 Encounter for other specified aftercare: Secondary | ICD-10-CM | POA: Diagnosis not present

## 2021-03-02 DIAGNOSIS — R5383 Other fatigue: Secondary | ICD-10-CM | POA: Diagnosis not present

## 2021-03-02 DIAGNOSIS — E876 Hypokalemia: Secondary | ICD-10-CM | POA: Diagnosis not present

## 2021-03-02 DIAGNOSIS — Z7952 Long term (current) use of systemic steroids: Secondary | ICD-10-CM | POA: Diagnosis not present

## 2021-03-02 DIAGNOSIS — Z881 Allergy status to other antibiotic agents status: Secondary | ICD-10-CM | POA: Diagnosis not present

## 2021-03-02 DIAGNOSIS — Z882 Allergy status to sulfonamides status: Secondary | ICD-10-CM | POA: Diagnosis not present

## 2021-03-02 DIAGNOSIS — Z888 Allergy status to other drugs, medicaments and biological substances status: Secondary | ICD-10-CM | POA: Diagnosis not present

## 2021-03-02 DIAGNOSIS — Z87891 Personal history of nicotine dependence: Secondary | ICD-10-CM | POA: Diagnosis not present

## 2021-03-02 DIAGNOSIS — C257 Malignant neoplasm of other parts of pancreas: Secondary | ICD-10-CM | POA: Diagnosis not present

## 2021-03-02 DIAGNOSIS — D649 Anemia, unspecified: Secondary | ICD-10-CM | POA: Diagnosis not present

## 2021-03-02 DIAGNOSIS — C259 Malignant neoplasm of pancreas, unspecified: Secondary | ICD-10-CM

## 2021-03-02 DIAGNOSIS — F319 Bipolar disorder, unspecified: Secondary | ICD-10-CM | POA: Diagnosis not present

## 2021-03-02 MED ORDER — HEPARIN SOD (PORK) LOCK FLUSH 100 UNIT/ML IV SOLN
500.0000 [IU] | Freq: Once | INTRAVENOUS | Status: AC | PRN
  Administered 2021-03-02: 500 [IU]
  Filled 2021-03-02: qty 5

## 2021-03-02 MED ORDER — PEGFILGRASTIM-CBQV 6 MG/0.6ML ~~LOC~~ SOSY
6.0000 mg | PREFILLED_SYRINGE | Freq: Once | SUBCUTANEOUS | Status: AC
  Administered 2021-03-02: 6 mg via SUBCUTANEOUS
  Filled 2021-03-02: qty 0.6

## 2021-03-02 MED ORDER — HEPARIN SOD (PORK) LOCK FLUSH 100 UNIT/ML IV SOLN
INTRAVENOUS | Status: AC
  Filled 2021-03-02: qty 5

## 2021-03-02 MED ORDER — SODIUM CHLORIDE 0.9% FLUSH
10.0000 mL | INTRAVENOUS | Status: DC | PRN
  Administered 2021-03-02: 10 mL
  Filled 2021-03-02: qty 10

## 2021-03-12 ENCOUNTER — Other Ambulatory Visit: Payer: Self-pay

## 2021-03-12 ENCOUNTER — Encounter: Payer: Self-pay | Admitting: Psychiatry

## 2021-03-12 ENCOUNTER — Telehealth (INDEPENDENT_AMBULATORY_CARE_PROVIDER_SITE_OTHER): Payer: BC Managed Care – PPO | Admitting: Psychiatry

## 2021-03-12 DIAGNOSIS — F1221 Cannabis dependence, in remission: Secondary | ICD-10-CM

## 2021-03-12 DIAGNOSIS — F411 Generalized anxiety disorder: Secondary | ICD-10-CM | POA: Diagnosis not present

## 2021-03-12 DIAGNOSIS — F3178 Bipolar disorder, in full remission, most recent episode mixed: Secondary | ICD-10-CM

## 2021-03-12 DIAGNOSIS — F17200 Nicotine dependence, unspecified, uncomplicated: Secondary | ICD-10-CM

## 2021-03-12 DIAGNOSIS — F603 Borderline personality disorder: Secondary | ICD-10-CM | POA: Diagnosis not present

## 2021-03-12 MED ORDER — DIVALPROEX SODIUM 250 MG PO DR TAB: 250.0000 mg | DELAYED_RELEASE_TABLET | Freq: Two times a day (BID) | ORAL | 1 refills | Status: DC

## 2021-03-12 MED ORDER — PROPRANOLOL HCL 20 MG PO TABS: 20.0000 mg | ORAL_TABLET | Freq: Three times a day (TID) | ORAL | 1 refills | Status: DC

## 2021-03-12 NOTE — Progress Notes (Signed)
Virtual Visit via Telephone Note  I connected with Sheryl Perez on 03/12/21 at 10:00 AM EST by telephone and verified that I am speaking with the correct person using two identifiers.  Location Provider Location : [facility] Patient Location : Home  Participants: Patient ,spouse, Provider    I discussed the limitations, risks, security and privacy concerns of performing an evaluation and management service by telephone and the availability of in person appointments. I also discussed with the patient that there may be a patient responsible charge related to this service. The patient expressed understanding and agreed to proceed.    I discussed the assessment and treatment plan with the patient. The patient was provided an opportunity to ask questions and all were answered. The patient agreed with the plan and demonstrated an understanding of the instructions.   The patient was advised to call back or seek an in-person evaluation if the symptoms worsen or if the condition fails to improve as anticipated.   [place] MD OP Progress Note  03/12/2021 1:25 PM Sheryl Perez  MRN:  [id number]  Chief Complaint:  Chief Complaint   Follow-up 61 year old Caucasian female who has a history of bipolar disorder, borderline personality disorder, cannabis use disorder in remission, generalized anxiety disorder, recent diagnosis of pancreatic adenocarcinoma status post surgery currently undergoing chemotherapy presented for medication management.    HPI: Sheryl Perez is a 61 year old Caucasian female who has a history of bipolar disorder, borderline personality disorder, cannabis use disorder in remission, GAD, is married, lives in [place], currently out of work, was evaluated by telemedicine today.  Patient with pancreatic adenocarcinoma status post pancreatectomy, proximal subtotal with total duodenectomy, partial gastrectomy-admitted 01/13/2021 to [facility], discharged 01/18/2021.  Per review of  notes per Dr.Rao -dated 02/16/2021-patient with adenocarcinoma stage II of pancreas, plan to restart modified FOLFIRINOX chemotherapy, plan to do 6 cycles if she can tolerate it.  Patient today reports she continues to be taking time off from work.  Currently restarted chemotherapy.  Patient reports overall she is managing okay.  Although anxious she is coping well.  Reports appetite is fair.  She had lost a few pounds however has gained some back.  Patient reports sleep is okay.  Compliant on medications like Depakote and propranolol.  Denies side effects.  Patient was prescribed olanzapine by her oncologist, currently uses it only as needed when she has chemotherapy related side effects of nausea or vomiting.  Currently not on Seroquel.  Patient denies any suicidality, homicidality or perceptual disturbances.  Patient denies any other concerns today.  Visit Diagnosis:    ICD-10-CM   1. Bipolar disorder, in full remission, most recent episode mixed (HCC)  F31.78 divalproex (DEPAKOTE) 250 MG DR tablet    propranolol (INDERAL) 20 MG tablet    2. GAD (generalized anxiety disorder)  F41.1 divalproex (DEPAKOTE) 250 MG DR tablet    3. Tobacco use disorder  F17.200     4. Borderline personality disorder (Sheryl Perez)  F60.3     5. Cannabis use disorder, moderate, in early remission ([place])  F12.21       Past Psychiatric History: Reviewed past psychiatric history from progress note on 04/12/2017.  Past trials of risperidone, Abilify, Lamictal, Prozac, trazodone, lithium.  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    Bipolar disorder (HCC)    Chest pain, unspecified    Chronic low back  pain 10/23/2013   Chronic UTI    COPD (chronic obstructive pulmonary disease) (HCC)    Depression    Dysfunction of eustachian tube    Enlargement of lymph nodes    Family history of brain cancer    Family history of breast  cancer    Family history of prostate cancer    History of Bell's palsy Right   Injury, other and unspecified, knee, leg, ankle, and foot    MRSA (methicillin resistant staph aureus) culture positive 07/11/2014   abscess   Other specified disease of hair and hair follicles    Pancreatic cancer ([place])    Pancreatic cancer ([place])    Rectal prolapse    Unspecified symptom associated with female genital organs     Past Surgical History:  Procedure Laterality Date   ABDOMINAL HYSTERECTOMY     bladder tack  2010   CHOLECYSTECTOMY  2002   FOOT SURGERY Bilateral 2004   bunion   GANGLION CYST EXCISION  2002   INCISION AND DRAINAGE ABSCESS  04-16-15   PARTIAL HYSTERECTOMY  2010   uterus removed   PORTA CATH INSERTION N/A 09/11/2020   Procedure: PORTA CATH INSERTION;  Surgeon: Algernon Huxley, MD;  Location: [facility];  Service: Cardiovascular;  Laterality: N/A;    Family Psychiatric History: Reviewed family psychiatric history from progress note on 04/12/2017.  Family History:  Family History  Problem Relation Age of Onset   Diabetes Father    Cancer Father        brain tumor   Depression Father    Depression Sister    Alcohol abuse Brother    Depression Brother    Leukemia Maternal Aunt    Prostate cancer Maternal Uncle    Breast cancer Paternal Aunt        dx >50   Heart disease Maternal Grandmother    Heart attack Maternal Grandmother    Stroke Maternal Grandmother    Parkinson's disease Maternal Grandfather    Leukemia Paternal Grandmother    Diabetes Paternal Grandfather    Breast cancer Cousin    Brain cancer Niece        dx under 81    Social History: Reviewed social history from progress note on 04/12/2017. Social History   Socioeconomic History   Marital status: Married    Spouse name: Not on file   Number of children: 3   Years of education: hs   Highest education level: Some college, no degree  Occupational History   Not on file  Tobacco Use   Smoking  status: Former    Packs/day: 0.50    Years: 40.00    Pack years: 20.00    Types: Cigarettes    Quit date: 07/14/2020    Years since quitting: 0.6   Smokeless tobacco: Never  Vaping Use   Vaping Use: Never used  Substance and Sexual Activity   Alcohol use: No    Alcohol/week: 1.0 standard drink    Types: 1 Glasses of wine per week    Comment: occasional   Drug use: Yes    Types: Marijuana    Comment: last used 05-20-15   Sexual activity: Yes    Birth control/protection: None  Other Topics Concern   Not on file  Social History Narrative   Patient lives at home with her husband [name]).   Patient works full time.   Education CNA    Right handed.   Caffeine Tea , Soda.   Social  Determinants of Health   Financial Resource Strain: Not on file  Food Insecurity: Not on file  Transportation Needs: Not on file  Physical Activity: Not on file  Stress: Not on file  Social Connections: Not on file    Allergies:  Allergies  Allergen Reactions   Sulfacetamide Sodium Swelling    Diarrhea,dizziness, tongue swelling   Other Other (See Comments) and Swelling    PT states that anything with the sides effects "may cause rash" she cannot take due to her psoriasis, it will make the patches on her hand so thick she can't use her hands.  Diarrhea,dizziness, tongue swelling   Oxycontin [Oxycodone Hcl] Nausea And Vomiting   Sulfa Antibiotics Swelling    Diarrhea,dizziness, tongue swelling   Abilify [Aripiprazole] Other (See Comments)   Cephalosporins Rash   Erythromycin Other (See Comments)    Abd. pain Abd. pain Other reaction(s): Other (See Comments) Abd. pain   Lamotrigine Rash and Other (See Comments)    psorasis worsen psorasis worsen   Risperidone Other (See Comments)    tremors tremors tremors    Metabolic Disorder Labs: Lab Results  Component Value Date   HGBA1C 5.3 08/24/2018   MPG 105.41 08/24/2018   MPG 105.41 04/14/2017   Lab Results  Component Value Date    PROLACTIN 9.3 08/24/2018   PROLACTIN 12.5 04/14/2017   Lab Results  Component Value Date   CHOL 197 12/12/2019   TRIG 68 08/04/2020   HDL 51 12/12/2019   CHOLHDL 3.9 12/12/2019   VLDL 37 08/24/2018   LDLCALC 123 (H) 12/12/2019   LDLCALC 107 (H) 08/24/2018   Lab Results  Component Value Date   TSH 1.27 12/12/2019   TSH 2.376 04/14/2017    Therapeutic Level Labs: Lab Results  Component Value Date   LITHIUM 0.34 (L) 04/14/2017   LITHIUM 0.3 (L) 06/15/2016   Lab Results  Component Value Date   VALPROATE 25 (L) 11/19/2020   VALPROATE 17 (L) 02/22/2020   No components found for:  CBMZ  Current Medications: Current Outpatient Medications  Medication Sig Dispense Refill   Ascorbic Acid (VITAMIN C) 1000 MG tablet Take 1,000 mg by mouth daily.     dexamethasone (DECADRON) 4 MG tablet Take 2 tablets (8 mg total) by mouth daily. Start the day after chemotherapy for 3 days. Take with food. 8 tablet 5   famotidine (PEPCID) 20 MG tablet Take 20 mg by mouth daily.     ferrous sulfate 325 (65 FE) MG tablet Take 325 mg by mouth 2 (two) times daily with a meal.     fluticasone (FLONASE) 50 MCG/ACT nasal spray Place 1 spray into both nostrils daily. 11.1 mL 2   loperamide (IMODIUM A-D) 2 MG tablet Take 2 at onset of diarrhea, then 1 every 2hrs until 12hr without a BM. May take 2 tab every 4hrs at bedtime. If diarrhea recurs repeat. 100 tablet 1   loratadine (CLARITIN) 10 MG tablet TAKE 1 TABLET BY MOUTH EVERY DAY 90 tablet 1   Multiple Vitamin (MULTIVITAMIN) tablet Take 1 tablet by mouth daily.     naproxen sodium (ALEVE) 220 MG tablet Take 220 mg by mouth daily as needed.     ondansetron (ZOFRAN) 8 MG tablet Take 1 tablet (8 mg total) by mouth 2 (two) times daily as needed. Start on day 3 after chemotherapy. 30 tablet 1   potassium chloride SA (KLOR-CON M20) 20 MEQ tablet Take 1 tablet (20 mEq total) by mouth 2 (two) times daily. [place]  tablet 0   ALPRAZolam (XANAX) 0.5 MG tablet Take 1 tablet  (0.5 mg total) by mouth 2 (two) times daily as needed for anxiety. (Patient not taking: Reported on 11/25/2020) 20 tablet 1   [START ON 04/20/2021] divalproex (DEPAKOTE) 250 MG DR tablet Take 1 tablet (250 mg total) by mouth 2 (two) times daily. 180 tablet 1   enoxaparin (LOVENOX) 30 MG/0.3ML injection SMARTSIG:0.3 Milliliter(s) SUB-Q Daily (Patient not taking: Reported on 03/12/2021)     HYDROmorphone (DILAUDID) 2 MG tablet Take by mouth. (Patient not taking: Reported on 03/12/2021)     OLANZapine (ZYPREXA) 10 MG tablet TAKE 1 TABLET BY MOUTH EVERYDAY AT BEDTIME (Patient not taking: Reported on 03/12/2021) 90 tablet 1   ondansetron (ZOFRAN-ODT) 4 MG disintegrating tablet Take 4 mg by mouth every 8 (eight) hours as needed. (Patient not taking: Reported on 03/12/2021)     pantoprazole (PROTONIX) 40 MG tablet Take by mouth. (Patient not taking: Reported on 03/12/2021)     prochlorperazine (COMPAZINE) 10 MG tablet Take 1 tablet (10 mg total) by mouth every 6 (six) hours as needed (Nausea or vomiting). (Patient not taking: Reported on 03/12/2021) 30 tablet 1   [START ON 04/20/2021] propranolol (INDERAL) 20 MG tablet Take 1 tablet (20 mg total) by mouth 3 (three) times daily. 270 tablet 1   No current facility-administered medications for this visit.   Facility-Administered Medications Ordered in Other Visits  Medication Dose Route Frequency Provider Last Rate Last Admin   heparin lock flush 100 UNIT/ML injection            sodium chloride flush (NS) 0.9 % injection 10 mL  10 mL Intracatheter PRN Sindy Guadeloupe, MD   10 mL at 11/03/20 1441     Musculoskeletal: Strength & Muscle Tone:  UTA Gait & Station:  UTA Patient leans: N/A  Psychiatric Specialty Exam: Review of Systems  Constitutional:  Positive for fatigue.  Psychiatric/Behavioral:  The patient is nervous/anxious.   All other systems reviewed and are negative.  Last menstrual period 02/09/2008.There is no height or weight on file to calculate BMI.   General Appearance:  UTA  Eye Contact:   UTA  Speech:  Clear and Coherent  Volume:  Normal  Mood:  Anxious  Affect:   UTA  Thought Process:  Goal Directed and Descriptions of Associations: Intact  Orientation:  Full (Time, Place, and Person)  Thought Content: Logical   Suicidal Thoughts:  No  Homicidal Thoughts:  No  Memory:  Immediate;   Fair Recent;   Fair Remote;   Fair  Judgement:  Fair  Insight:  Fair  Psychomotor Activity:   UTA  Concentration:  Concentration: Fair and Attention Span: Fair  Recall:  [employer] of Knowledge: Fair  Language: Fair  Akathisia:  No  Handed:  Right  AIMS (if indicated): not done  Assets:  Communication Skills Desire for Improvement Housing Social Support  ADL's:  Intact  Cognition: WNL  Sleep:  Fair   Screenings: AIMS    Flowsheet Row Video Visit from 04/21/2020 in [facility] Office Visit from 05/03/2016 in [facility] Admission (Discharged) from 01/15/2015 in [facility] Total Score 0 0 0      AUDIT    [place] Admission (Discharged) from 01/15/2015 in [place]  Alcohol Use Disorder Identification Test Final Score (AUDIT) 1      GAD-7    Flowsheet Row Video Visit from 09/08/2020 in [facility]  [facility] Office Visit from 08/19/2020 in [place] Office Visit from 04/18/2015 in [facility]  Total GAD-7 Score 6 2 14       PHQ2-9    [place] Office Visit from 12/23/2020 in [facility] Office Visit from 11/25/2020 in [facility] Video Visit from 10/22/2020 in [place] Video Visit from 09/08/2020 in [facility] [facility] Office Visit from 08/19/2020 in [facility]  PHQ-2 Total Score 0 1 0 2 0  PHQ-9 Total Score -- 3 2 5  0       [place] Office Visit from 11/25/2020 in [place] Video Visit from 09/08/2020 in [place] Office Visit from 08/19/2020 in [place] No Risk No Risk No Risk        Assessment and Plan: VELLA COLQUITT is a 61 year old Caucasian female who has a history of bipolar disorder, cannabis use disorder, tobacco use disorder, psoriatic arthritis, pancreatic adenocarcinoma was evaluated by telemedicine today.  Patient is currently stable.  Plan as noted below.  Plan Bipolar disorder in remission Depakote 250 mg p.o. twice daily Discontinue Seroquel since she is currently on olanzapine prescribed by her oncologist.  Olanzapine 10 mg at bedtime ,she uses it only as needed for side effects of chemotherapy. Depakote level-10/12/202-25-subtherapeutic.  GAD-stable Depakote 250 mg p.o. twice daily Propranolol 10 mg p.o. 3 times daily Hydroxyzine 10 mg p.o. 3 times daily as needed  Cannabis use disorder in remission-Will monitor closely  Reviewed notes per Dr. Margarito Courser as noted above.  Follow-up in clinic in 3 months in person.   I have spent at least 20 minutes non face to face with patient today.    This note was generated in part or whole with voice recognition software. Voice recognition is usually quite accurate but there are transcription errors that can and very often do occur. I apologize for any typographical errors that were not detected and corrected.        Ursula Alert, MD 03/12/2021, 1:25 PM

## 2021-03-13 ENCOUNTER — Ambulatory Visit: Payer: BC Managed Care – PPO | Admitting: Oncology

## 2021-03-13 ENCOUNTER — Ambulatory Visit: Payer: BC Managed Care – PPO

## 2021-03-13 ENCOUNTER — Other Ambulatory Visit: Payer: BC Managed Care – PPO

## 2021-03-15 DIAGNOSIS — C259 Malignant neoplasm of pancreas, unspecified: Secondary | ICD-10-CM | POA: Diagnosis not present

## 2021-03-18 ENCOUNTER — Inpatient Hospital Stay: Payer: BC Managed Care – PPO | Attending: Oncology

## 2021-03-18 ENCOUNTER — Inpatient Hospital Stay (HOSPITAL_BASED_OUTPATIENT_CLINIC_OR_DEPARTMENT_OTHER): Payer: BC Managed Care – PPO | Admitting: Oncology

## 2021-03-18 ENCOUNTER — Encounter: Payer: Self-pay | Admitting: Oncology

## 2021-03-18 ENCOUNTER — Other Ambulatory Visit: Payer: Self-pay

## 2021-03-18 VITALS — BP 115/68 | HR 70 | Temp 96.0°F | Resp 16 | Ht 60.0 in | Wt 111.1 lb

## 2021-03-18 DIAGNOSIS — T451X5A Adverse effect of antineoplastic and immunosuppressive drugs, initial encounter: Secondary | ICD-10-CM | POA: Diagnosis not present

## 2021-03-18 DIAGNOSIS — Z79899 Other long term (current) drug therapy: Secondary | ICD-10-CM | POA: Diagnosis not present

## 2021-03-18 DIAGNOSIS — Z833 Family history of diabetes mellitus: Secondary | ICD-10-CM | POA: Diagnosis not present

## 2021-03-18 DIAGNOSIS — Z803 Family history of malignant neoplasm of breast: Secondary | ICD-10-CM | POA: Insufficient documentation

## 2021-03-18 DIAGNOSIS — C257 Malignant neoplasm of other parts of pancreas: Secondary | ICD-10-CM | POA: Insufficient documentation

## 2021-03-18 DIAGNOSIS — Z5111 Encounter for antineoplastic chemotherapy: Secondary | ICD-10-CM | POA: Diagnosis not present

## 2021-03-18 DIAGNOSIS — R5383 Other fatigue: Secondary | ICD-10-CM | POA: Diagnosis not present

## 2021-03-18 DIAGNOSIS — C259 Malignant neoplasm of pancreas, unspecified: Secondary | ICD-10-CM | POA: Diagnosis not present

## 2021-03-18 DIAGNOSIS — Z5189 Encounter for other specified aftercare: Secondary | ICD-10-CM | POA: Insufficient documentation

## 2021-03-18 DIAGNOSIS — Z808 Family history of malignant neoplasm of other organs or systems: Secondary | ICD-10-CM | POA: Diagnosis not present

## 2021-03-18 DIAGNOSIS — Z8042 Family history of malignant neoplasm of prostate: Secondary | ICD-10-CM | POA: Diagnosis not present

## 2021-03-18 DIAGNOSIS — Z8249 Family history of ischemic heart disease and other diseases of the circulatory system: Secondary | ICD-10-CM | POA: Diagnosis not present

## 2021-03-18 DIAGNOSIS — Z811 Family history of alcohol abuse and dependence: Secondary | ICD-10-CM | POA: Insufficient documentation

## 2021-03-18 DIAGNOSIS — Z818 Family history of other mental and behavioral disorders: Secondary | ICD-10-CM | POA: Insufficient documentation

## 2021-03-18 DIAGNOSIS — Z806 Family history of leukemia: Secondary | ICD-10-CM | POA: Insufficient documentation

## 2021-03-18 DIAGNOSIS — Z823 Family history of stroke: Secondary | ICD-10-CM | POA: Diagnosis not present

## 2021-03-18 DIAGNOSIS — D6481 Anemia due to antineoplastic chemotherapy: Secondary | ICD-10-CM | POA: Insufficient documentation

## 2021-03-18 DIAGNOSIS — Z87891 Personal history of nicotine dependence: Secondary | ICD-10-CM | POA: Insufficient documentation

## 2021-03-18 DIAGNOSIS — C779 Secondary and unspecified malignant neoplasm of lymph node, unspecified: Secondary | ICD-10-CM | POA: Diagnosis not present

## 2021-03-18 LAB — CBC WITH DIFFERENTIAL/PLATELET
Abs Immature Granulocytes: 0.09 10*3/uL — ABNORMAL HIGH (ref 0.00–0.07)
Basophils Absolute: 0 10*3/uL (ref 0.0–0.1)
Basophils Relative: 0 %
Eosinophils Absolute: 0.1 10*3/uL (ref 0.0–0.5)
Eosinophils Relative: 2 %
HCT: 34.2 % — ABNORMAL LOW (ref 36.0–46.0)
Hemoglobin: 11.3 g/dL — ABNORMAL LOW (ref 12.0–15.0)
Immature Granulocytes: 1 %
Lymphocytes Relative: 24 %
Lymphs Abs: 1.6 10*3/uL (ref 0.7–4.0)
MCH: 33.3 pg (ref 26.0–34.0)
MCHC: 33 g/dL (ref 30.0–36.0)
MCV: 100.9 fL — ABNORMAL HIGH (ref 80.0–100.0)
Monocytes Absolute: 0.6 10*3/uL (ref 0.1–1.0)
Monocytes Relative: 10 %
Neutro Abs: 4 10*3/uL (ref 1.7–7.7)
Neutrophils Relative %: 63 %
Platelets: 279 10*3/uL (ref 150–400)
RBC: 3.39 MIL/uL — ABNORMAL LOW (ref 3.87–5.11)
RDW: 12.9 % (ref 11.5–15.5)
WBC: 6.4 10*3/uL (ref 4.0–10.5)
nRBC: 0 % (ref 0.0–0.2)

## 2021-03-18 LAB — COMPREHENSIVE METABOLIC PANEL
ALT: 21 U/L (ref 0–44)
AST: 28 U/L (ref 15–41)
Albumin: 3.7 g/dL (ref 3.5–5.0)
Alkaline Phosphatase: 132 U/L — ABNORMAL HIGH (ref 38–126)
Anion gap: 8 (ref 5–15)
BUN: 12 mg/dL (ref 6–20)
CO2: 26 mmol/L (ref 22–32)
Calcium: 9.2 mg/dL (ref 8.9–10.3)
Chloride: 104 mmol/L (ref 98–111)
Creatinine, Ser: 0.89 mg/dL (ref 0.44–1.00)
GFR, Estimated: >60 mL/min (ref >60)
Glucose, Bld: 143 mg/dL — ABNORMAL HIGH (ref 70–99)
Potassium: 3.4 mmol/L — ABNORMAL LOW (ref 3.5–5.1)
Sodium: 138 mmol/L (ref 135–145)
Total Bilirubin: 0.1 mg/dL — ABNORMAL LOW (ref 0.3–1.2)
Total Protein: 7.1 g/dL (ref 6.5–8.1)

## 2021-03-18 MED ORDER — POTASSIUM CHLORIDE CRYS ER 20 MEQ PO TBCR: 20.0000 meq | EXTENDED_RELEASE_TABLET | Freq: Two times a day (BID) | ORAL | 0 refills | Status: DC

## 2021-03-18 NOTE — Progress Notes (Signed)
Hematology/Oncology Consult note [facility]  [phone] [phone]  Patient Care Team: Cletis Athens, MD as PCP - General (Internal Medicine) Grant Fontana, [place] as Referring Physician (Chiropractic Medicine) Christene Lye, MD (General Surgery) Sindy Guadeloupe, MD as Consulting Physician (Hematology and Oncology)   Name of the patient: Sheryl Perez  [id number]  10-16-1960   Date of visit: 03/18/21  Diagnosis- pancreatic adenocarcinoma stage II apT2 N1 M0  Chief complaint/ Reason for visit-on treatment assessment prior to cycle 9 of adjuvant modified FOLFIRINOX chemotherapy  Heme/Onc history: patient is a 61 year old female with a history of irritable bowel disease for which she follows up with [facility].  She has undergone EGD and colonoscopy with them in the past.  More recently patient was complaining of left upper quadrant and epigastric abdominal pain which causes sudden episodes of abdominal spasms and was prescribed as needed tramadol for it.  These episodes of pain are unrelated to food and are dull aching or throbbing.  She has undergone cholecystectomy in the past.  She underwent CT abdomen and pelvis with contrast in the ER on 08/04/2020 Which did not reveal any acute pathology which showed a possible hypoechoic 1.9 cm right hepatic lobe lesion.  This was followed by an MRI which showed no discrete lesion in the liver and the area of concern noted on CT scan was more likely to represent variable fat deposition.  However there was loss of normal T1 signal in the peripheral pancreas and a potential small pancreatic lesion suspicious for early pancreatic adenocarcinoma in the neck of the pancreas.     Patient underwent EUS by Dr. Mont Dutton which showed an irregular mass in the pancreatic neck measuring 15 x 11 mm.  FNA showed adenocarcinoma.  Pancreatic duct measured 1.1 mm in the head with abrupt dilatation in the region of the mass to  4.1 mm.  No abnormality noted in the common bile duct and hepatic duct.  Celiac region showed no significant endosonographic abnormality.  No lymphadenopathy was seen.  CA 19-9 mildly elevated at 35   Patient was seen by Dr. Hyman Hopes from pancreaticobiliary surgery at [facility] for surgical opinion.  Although patient has a small lesion involving the pancreatic neck, there was a concern for tumor extension towards the splenic vein and narrowing of portal splenic confluence.  Patient received 3 months of neoadjuvant modified FOLFIRINOX chemotherapyAnd then underwent definitive Whipple surgery at [facility] with Dr. Hyman Hopes on 01/13/2021.  Final pathology showed 2.3 cm grade 2 moderately differentiated adenocarcinoma with lymphovascular and perineural invasion.  Treatment effect present with residual cancer showing evident tumor regression but more than single cells or rare small group of cancer cells] partial response.  Margins negative.  1 out of 22 lymph nodes positive for malignancy.  Interval history-she has lost 2 more pounds since November 2022.  Reports that her appetite is improving.  Denies any significant nausea.  She still has a small area in her midline wound which leaks constantly.  She is seeing Dr. Hyman Hopes next week for this.  Denies any significant tingling numbness in her extremities  ECOG PS- 1 Pain scale- 0   Review of systems- Review of Systems  Constitutional:  Positive for malaise/fatigue and weight loss. Negative for chills and fever.  HENT:  Negative for congestion, ear discharge and nosebleeds.   Eyes:  Negative for blurred vision.  Respiratory:  Negative for cough, hemoptysis, sputum production, shortness of breath and wheezing.   Cardiovascular:  Negative for chest pain, palpitations, orthopnea and claudication.  Gastrointestinal:  Negative for abdominal pain, blood in stool, constipation, diarrhea, heartburn, melena, nausea and vomiting.  Genitourinary:  Negative for dysuria, flank  pain, frequency, hematuria and urgency.  Musculoskeletal:  Negative for back pain, joint pain and myalgias.  Skin:  Negative for rash.  Neurological:  Negative for dizziness, tingling, focal weakness, seizures, weakness and headaches.  Endo/Heme/Allergies:  Does not bruise/bleed easily.  Psychiatric/Behavioral:  Negative for depression and suicidal ideas. The patient does not have insomnia.       Allergies  Allergen Reactions   Sulfacetamide Sodium Swelling    Diarrhea,dizziness, tongue swelling   Other Other (See Comments) and Swelling    PT states that anything with the sides effects "may cause rash" she cannot take due to her psoriasis, it will make the patches on her hand so thick she can't use her hands.  Diarrhea,dizziness, tongue swelling   Oxycontin [Oxycodone Hcl] Nausea And Vomiting   Sulfa Antibiotics Swelling    Diarrhea,dizziness, tongue swelling   Abilify [Aripiprazole] Other (See Comments)   Cephalosporins Rash   Erythromycin Other (See Comments)    Abd. pain Abd. pain Other reaction(s): Other (See Comments) Abd. 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    Bipolar disorder (HCC)    Chest pain, unspecified    Chronic low back pain 10/23/2013   Chronic UTI    COPD (chronic obstructive pulmonary disease) (HCC)    Depression    Dysfunction of eustachian tube    Enlargement of lymph nodes    Family history of brain cancer    Family history of breast cancer    Family history of prostate cancer    History of Bell's palsy Right   Injury, other and unspecified, knee, leg, ankle, and foot    MRSA (methicillin resistant staph aureus) culture positive 07/11/2014   abscess   Other specified disease of hair and hair follicles    Pancreatic cancer  ([place])    Pancreatic cancer ([place])    Rectal prolapse    Unspecified symptom associated with female genital organs      Past Surgical History:  Procedure Laterality Date   ABDOMINAL HYSTERECTOMY     bladder tack  2010   CHOLECYSTECTOMY  2002   FOOT SURGERY Bilateral 2004   bunion   GANGLION CYST EXCISION  2002   INCISION AND DRAINAGE ABSCESS  04-16-15   PARTIAL HYSTERECTOMY  2010   uterus removed   PORTA CATH INSERTION N/A 09/11/2020   Procedure: PORTA CATH INSERTION;  Surgeon: Algernon Huxley, MD;  Location: [facility];  Service: Cardiovascular;  Laterality: N/A;    Social History   Socioeconomic History   Marital status: Married    Spouse name: Not on file   Number of children: 3   Years of education: hs   Highest education level: Some college, no degree  Occupational History   Not on file  Tobacco Use   Smoking status: Former    Packs/day: 0.50    Years: 40.00    Pack years: 20.00    Types: Cigarettes    Quit date: 07/14/2020    Years since quitting: 0.6   Smokeless tobacco: Never  Vaping Use   Vaping Use: Never used  Substance and Sexual Activity   Alcohol use: No    Alcohol/week: 1.0 standard drink    Types: 1 Glasses of wine per week    Comment: occasional   Drug use: Yes    Types: Marijuana    Comment: last used 05-20-15   Sexual activity: Yes    Birth control/protection: None  Other Topics Concern   Not on file  Social History Narrative   Patient lives at home with her husband [name]).   Patient works full time.   Education CNA    Right handed.   Caffeine Tea , Soda.   Social Determinants of Health   Financial Resource Strain: Not on file  Food Insecurity: Not on file  Transportation Needs: Not on file  Physical Activity: Not on file  Stress: Not on file  Social Connections: Not on file  Intimate Partner Violence: Not on file    Family History  Problem Relation Age of Onset   Diabetes Father    Cancer Father        brain tumor    Depression Father    Depression Sister    Alcohol abuse Brother    Depression Brother    Leukemia Maternal Aunt    Prostate cancer Maternal Uncle    Breast cancer Paternal Aunt        dx >50   Heart disease Maternal Grandmother    Heart attack Maternal Grandmother    Stroke Maternal Grandmother    Parkinson's disease Maternal Grandfather    Leukemia Paternal Grandmother    Diabetes Paternal Grandfather    Breast cancer Cousin    Brain cancer Niece        dx under 50     Current Outpatient Medications:    ALPRAZolam (XANAX) 0.5 MG tablet, Take 1 tablet (0.5 mg total) by mouth 2 (two) times daily as needed for anxiety. (Patient not taking: Reported on 11/25/2020), Disp: 20 tablet, Rfl: 1   Ascorbic Acid (VITAMIN C) 1000 MG tablet, Take 1,000 mg by mouth daily., Disp: , Rfl:    dexamethasone (DECADRON) 4 MG tablet, Take 2 tablets (8 mg total) by mouth daily. Start the day after chemotherapy for 3 days. Take with food., Disp: 8 tablet, Rfl: 5   [START ON 04/20/2021] divalproex (DEPAKOTE) 250 MG DR tablet, Take 1 tablet (250 mg total) by mouth 2 (two) times daily., Disp: 180 tablet, Rfl: 1   enoxaparin (LOVENOX) 30 MG/0.3ML injection, SMARTSIG:0.3 Milliliter(s) SUB-Q Daily (Patient not taking: Reported on 03/12/2021), Disp: , Rfl:    famotidine (PEPCID) 20 MG tablet, Take 20 mg by mouth daily., Disp: , Rfl:    ferrous sulfate 325 (65 FE) MG tablet, Take 325 mg by mouth 2 (two) times daily with a meal., Disp: , Rfl:    fluticasone (FLONASE) 50 MCG/ACT nasal spray, Place 1 spray into both nostrils daily., Disp: 11.1 mL, Rfl: 2   HYDROmorphone (DILAUDID) 2 MG tablet, Take by mouth. (Patient not taking: Reported on 03/12/2021), Disp: , Rfl:    loperamide (IMODIUM A-D) 2 MG tablet, Take 2 at onset of diarrhea, then 1 every 2hrs until 12hr without a BM. May take 2 tab every 4hrs at bedtime. If diarrhea recurs repeat., Disp: 100 tablet, Rfl: 1   loratadine (CLARITIN) 10 MG tablet, TAKE 1 TABLET BY  MOUTH EVERY DAY, Disp: 90 tablet, Rfl: 1   Multiple Vitamin (MULTIVITAMIN) tablet, Take 1 tablet by mouth daily., Disp: , Rfl:  naproxen sodium (ALEVE) 220 MG tablet, Take 220 mg by mouth daily as needed., Disp: , Rfl:    OLANZapine (ZYPREXA) 10 MG tablet, TAKE 1 TABLET BY MOUTH EVERYDAY AT BEDTIME (Patient not taking: Reported on 03/12/2021), Disp: 90 tablet, Rfl: 1   ondansetron (ZOFRAN) 8 MG tablet, Take 1 tablet (8 mg total) by mouth 2 (two) times daily as needed. Start on day 3 after chemotherapy., Disp: 30 tablet, Rfl: 1   ondansetron (ZOFRAN-ODT) 4 MG disintegrating tablet, Take 4 mg by mouth every 8 (eight) hours as needed. (Patient not taking: Reported on 03/12/2021), Disp: , Rfl:    pantoprazole (PROTONIX) 40 MG tablet, Take by mouth. (Patient not taking: Reported on 03/12/2021), Disp: , Rfl:    potassium chloride SA (KLOR-CON M20) 20 MEQ tablet, Take 1 tablet (20 mEq total) by mouth 2 (two) times daily., Disp: 14 tablet, Rfl: 0   prochlorperazine (COMPAZINE) 10 MG tablet, Take 1 tablet (10 mg total) by mouth every 6 (six) hours as needed (Nausea or vomiting). (Patient not taking: Reported on 03/12/2021), Disp: 30 tablet, Rfl: 1   [START ON 04/20/2021] propranolol (INDERAL) 20 MG tablet, Take 1 tablet (20 mg total) by mouth 3 (three) times daily., Disp: 270 tablet, Rfl: 1 No current facility-administered medications for this visit.  Facility-Administered Medications Ordered in Other Visits:    heparin lock flush 100 UNIT/ML injection, , , ,    sodium chloride flush (NS) 0.9 % injection 10 mL, 10 mL, Intracatheter, PRN, Sindy Guadeloupe, MD, 10 mL at 11/03/20 1441  Physical exam:  Vitals:   03/18/21 0900 03/18/21 0915  BP: 115/68   Pulse: 70   Resp: 16   Temp: (!) 96 F (35.6 C)   TempSrc: Tympanic   SpO2: 99%   Weight: 111 lb (50.3 kg) 111 lb 1.6 oz (50.4 kg)  Height: 5' (1.524 m) 5' (1.524 m)   Physical Exam Cardiovascular:     Rate and Rhythm: Normal rate and regular rhythm.      Heart sounds: Normal heart sounds.  Pulmonary:     Effort: Pulmonary effort is normal.     Breath sounds: Normal breath sounds.  Abdominal:     General: Bowel sounds are normal.     Palpations: Abdomen is soft.     Comments: Midline surgical wound over pulse is overall healed well except for a small area in the lower part of the incision which is still open with intermittent serous drainage  Skin:    General: Skin is warm and dry.  Neurological:     Mental Status: She is alert and oriented to person, place, and time.     CMP Latest Ref Rng & Units 02/27/2021  Glucose 70 - 99 mg/dL 133(H)  BUN 6 - 20 mg/dL 14  Creatinine 0.44 - 1.00 mg/dL 0.98  Sodium 135 - 145 mmol/L 141  Potassium 3.5 - 5.1 mmol/L 3.8  Chloride 98 - 111 mmol/L 106  CO2 22 - 32 mmol/L 27  Calcium 8.9 - 10.3 mg/dL 9.5  Total Protein 6.5 - 8.1 g/dL 7.4  Total Bilirubin 0.3 - 1.2 mg/dL 0.2(L)  Alkaline Phos 38 - 126 U/L 106  AST 15 - 41 U/L 29  ALT 0 - 44 U/L 20   CBC Latest Ref Rng & Units 03/18/2021  WBC 4.0 - 10.5 K/uL 6.4  Hemoglobin 12.0 - 15.0 g/dL 11.3(L)  Hematocrit 36.0 - 46.0 % 34.2(L)  Platelets 150 - 400 K/uL 279  Assessment and plan- Patient is a 61 y.o. female with pancreatic adenocarcinoma s/p 3 months of neoadjuvant modified FOLFIRINOX chemotherapy s/p Whipple's.  Final pathology showed T2N1 disease.  She is here for on treatment assessment prior to cycle 9 of adjuvant modified FOLFIRINOX chemotherapy  Counts okay to proceed with cycle 9 of adjuvant modified FOLFIRINOX chemotherapy which will be given on 03/20/2021 with pump disconnect on Monday.  Plan is to offer total 12 cycles of treatment.  I will see her 2 weeks from now for cycle 10.  Chemo induced anemia: Overall stable continue to monitor  Postoperative midline surgical wound.  She has a small opening in the lower part of her visible scar which is draining clear serous fluid.  It does not appear to be infected.  She sees Dr. Hyman Hopes  next week as well.  Continue to monitor   Visit Diagnosis 1. Encounter for antineoplastic chemotherapy   2. Pancreatic adenocarcinoma [facility])      Dr. Randa Evens, MD, MPH [facility] at [facility] [phone] 03/18/2021 9:13 AM

## 2021-03-20 ENCOUNTER — Other Ambulatory Visit: Payer: Self-pay

## 2021-03-20 ENCOUNTER — Inpatient Hospital Stay: Payer: BC Managed Care – PPO

## 2021-03-20 VITALS — BP 128/50 | HR 77 | Temp 96.7°F | Resp 18 | Wt 113.4 lb

## 2021-03-20 DIAGNOSIS — C779 Secondary and unspecified malignant neoplasm of lymph node, unspecified: Secondary | ICD-10-CM | POA: Diagnosis not present

## 2021-03-20 DIAGNOSIS — Z833 Family history of diabetes mellitus: Secondary | ICD-10-CM | POA: Diagnosis not present

## 2021-03-20 DIAGNOSIS — T451X5A Adverse effect of antineoplastic and immunosuppressive drugs, initial encounter: Secondary | ICD-10-CM | POA: Diagnosis not present

## 2021-03-20 DIAGNOSIS — C259 Malignant neoplasm of pancreas, unspecified: Secondary | ICD-10-CM

## 2021-03-20 DIAGNOSIS — Z808 Family history of malignant neoplasm of other organs or systems: Secondary | ICD-10-CM | POA: Diagnosis not present

## 2021-03-20 DIAGNOSIS — Z8042 Family history of malignant neoplasm of prostate: Secondary | ICD-10-CM | POA: Diagnosis not present

## 2021-03-20 DIAGNOSIS — Z818 Family history of other mental and behavioral disorders: Secondary | ICD-10-CM | POA: Diagnosis not present

## 2021-03-20 DIAGNOSIS — Z8249 Family history of ischemic heart disease and other diseases of the circulatory system: Secondary | ICD-10-CM | POA: Diagnosis not present

## 2021-03-20 DIAGNOSIS — Z79899 Other long term (current) drug therapy: Secondary | ICD-10-CM | POA: Diagnosis not present

## 2021-03-20 DIAGNOSIS — Z5189 Encounter for other specified aftercare: Secondary | ICD-10-CM | POA: Diagnosis not present

## 2021-03-20 DIAGNOSIS — Z87891 Personal history of nicotine dependence: Secondary | ICD-10-CM | POA: Diagnosis not present

## 2021-03-20 DIAGNOSIS — R5383 Other fatigue: Secondary | ICD-10-CM | POA: Diagnosis not present

## 2021-03-20 DIAGNOSIS — C257 Malignant neoplasm of other parts of pancreas: Secondary | ICD-10-CM | POA: Diagnosis not present

## 2021-03-20 DIAGNOSIS — D6481 Anemia due to antineoplastic chemotherapy: Secondary | ICD-10-CM | POA: Diagnosis not present

## 2021-03-20 DIAGNOSIS — Z803 Family history of malignant neoplasm of breast: Secondary | ICD-10-CM | POA: Diagnosis not present

## 2021-03-20 DIAGNOSIS — Z811 Family history of alcohol abuse and dependence: Secondary | ICD-10-CM | POA: Diagnosis not present

## 2021-03-20 DIAGNOSIS — Z5111 Encounter for antineoplastic chemotherapy: Secondary | ICD-10-CM | POA: Diagnosis not present

## 2021-03-20 MED ORDER — OXALIPLATIN CHEMO INJECTION 100 MG/20ML
65.0000 mg/m2 | Freq: Once | INTRAVENOUS | Status: AC
  Administered 2021-03-20: 100 mg via INTRAVENOUS
  Filled 2021-03-20: qty 20

## 2021-03-20 MED ORDER — DEXTROSE 5 % IV SOLN
Freq: Once | INTRAVENOUS | Status: AC
  Filled 2021-03-20: qty 250

## 2021-03-20 MED ORDER — SODIUM CHLORIDE 0.9 % IV SOLN
10.0000 mg | Freq: Once | INTRAVENOUS | Status: AC
  Administered 2021-03-20: 10 mg via INTRAVENOUS
  Filled 2021-03-20: qty 10

## 2021-03-20 MED ORDER — SODIUM CHLORIDE 0.9 % IV SOLN
Freq: Once | INTRAVENOUS | Status: AC
  Filled 2021-03-20: qty 250

## 2021-03-20 MED ORDER — SODIUM CHLORIDE 0.9 % IV SOLN
150.0000 mg | Freq: Once | INTRAVENOUS | Status: AC
  Administered 2021-03-20: 150 mg via INTRAVENOUS
  Filled 2021-03-20: qty 150

## 2021-03-20 MED ORDER — SODIUM CHLORIDE 0.9 % IV SOLN
150.0000 mg/m2 | Freq: Once | INTRAVENOUS | Status: AC
  Administered 2021-03-20: 220 mg via INTRAVENOUS
  Filled 2021-03-20: qty 10

## 2021-03-20 MED ORDER — PALONOSETRON HCL INJECTION 0.25 MG/5ML
0.2500 mg | Freq: Once | INTRAVENOUS | Status: AC
  Administered 2021-03-20: 0.25 mg via INTRAVENOUS
  Filled 2021-03-20: qty 5

## 2021-03-20 MED ORDER — SODIUM CHLORIDE 0.9 % IV SOLN
2400.0000 mg/m2 | INTRAVENOUS | Status: DC
  Administered 2021-03-20: 3600 mg via INTRAVENOUS
  Filled 2021-03-20: qty 72

## 2021-03-20 MED ORDER — ATROPINE SULFATE 1 MG/ML IV SOLN
0.5000 mg | Freq: Once | INTRAVENOUS | Status: AC | PRN
  Administered 2021-03-20: 0.5 mg via INTRAVENOUS
  Filled 2021-03-20: qty 1

## 2021-03-20 MED ORDER — SODIUM CHLORIDE 0.9 % IV SOLN
600.0000 mg | Freq: Once | INTRAVENOUS | Status: AC
  Administered 2021-03-20: 600 mg via INTRAVENOUS
  Filled 2021-03-20: qty 30

## 2021-03-20 NOTE — Patient Instructions (Signed)
[facility]  Discharge Instructions: °Thank you for choosing [facility] to provide your oncology and hematology care.  ° °If you have a lab appointment with the Cancer Center, please go directly to the Cancer Center and check in at the registration area. °  °Wear comfortable clothing and clothing appropriate for easy access to any Portacath or PICC line.  ° °We strive to give you quality time with your provider. You may need to reschedule your appointment if you arrive late (15 or more minutes).  Arriving late affects you and other patients whose appointments are after yours.  Also, if you miss three or more appointments without notifying the office, you may be dismissed from the clinic at the provider’s discretion.    °  °For prescription refill requests, have your pharmacy contact our office and allow 72 hours for refills to be completed.   ° °Today you received the following chemotherapy and/or immunotherapy agents     °  °To help prevent nausea and vomiting after your treatment, we encourage you to take your nausea medication as directed. ° °BELOW ARE SYMPTOMS THAT SHOULD BE REPORTED IMMEDIATELY: °*FEVER GREATER THAN 100.4 F (38 °C) OR HIGHER °*CHILLS OR SWEATING °*NAUSEA AND VOMITING THAT IS NOT CONTROLLED WITH YOUR NAUSEA MEDICATION °*UNUSUAL SHORTNESS OF BREATH °*UNUSUAL BRUISING OR BLEEDING °*URINARY PROBLEMS (pain or burning when urinating, or frequent urination) °*BOWEL PROBLEMS (unusual diarrhea, constipation, pain near the anus) °TENDERNESS IN MOUTH AND THROAT WITH OR WITHOUT PRESENCE OF ULCERS (sore throat, sores in mouth, or a toothache) °UNUSUAL RASH, SWELLING OR PAIN  °UNUSUAL VAGINAL DISCHARGE OR ITCHING  ° °Items with * indicate a potential emergency and should be followed up as soon as possible or go to the Emergency Department if any problems should occur. ° °Please show the CHEMOTHERAPY ALERT CARD or IMMUNOTHERAPY ALERT CARD at check-in to the  Emergency Department and triage nurse. ° °Should you have questions after your visit or need to cancel or reschedule your appointment, please contact [facility]  Dept: [phone]  and follow the prompts.  Office hours are 8:00 a.m. to 4:30 p.m. Monday - Friday. Please note that voicemails left after 4:00 p.m. may not be returned until the following business day.  We are closed weekends and major holidays. You have access to a nurse at all times for urgent questions. Please call the main number to the clinic Dept: [phone] and follow the prompts. ° ° °For any non-urgent questions, you may also contact your provider using MyChart. We now offer e-Visits for anyone 18 and older to request care online for non-urgent symptoms. For details visit [email or web address]. °  °Also download the MyChart app! Go to the app store, search "MyChart", open the app, select [facility], and log in with your MyChart username and password. ° °Due to Covid, a mask is required upon entering the hospital/clinic. If you do not have a mask, one will be given to you upon arrival. For doctor visits, patients may have 1 support person aged 18 or older with them. For treatment visits, patients cannot have anyone with them due to current Covid guidelines and our immunocompromised population.  ° °

## 2021-03-20 NOTE — Progress Notes (Signed)
Nutrition Follow-up:  Patient with stage I pancreatic cancer.  Patient completed neoadjuvant chemotherapy.  S/p whipple on 12/6 at [facility].  Patient on adjuvant treatment of modified folfirinox.   Met with patient during infusion.  Appetite is fairly good.  Ate a bacon, egg and cheese biscuit before coming to treatment.  Has been eating brunswick stew, drinking chocolate milk. Sometimes eating doughnuts.  Reports having to take imodium times 2 since 1/20.     Medications: reviewed  Labs: reviewed  Anthropometrics:   Weight 113 lb 7 oz today  112 lb 7 oz on 1/20 115 lb 14.4 oz on 11/4 113 lb 14.4 oz on 10/7 111 lb on 9/6 113 lb 4.8 oz on 8/19   NUTRITION DIAGNOSIS: Unintentional weight loss improving   INTERVENTION:  Continue high calorie, high protein foods Continue chocolate milk (aware of cold items following treatment).      MONITORING, EVALUATION, GOAL: weight trends, intake   NEXT VISIT: ~4 weeks during treatment  Friday, March 10th  Reinaldo Helt B. Zenia Resides, [place], [place] Registered Dietitian [phone] (mobile)

## 2021-03-23 ENCOUNTER — Other Ambulatory Visit: Payer: Self-pay

## 2021-03-23 ENCOUNTER — Inpatient Hospital Stay: Payer: BC Managed Care – PPO

## 2021-03-23 DIAGNOSIS — Z811 Family history of alcohol abuse and dependence: Secondary | ICD-10-CM | POA: Diagnosis not present

## 2021-03-23 DIAGNOSIS — Z5111 Encounter for antineoplastic chemotherapy: Secondary | ICD-10-CM | POA: Diagnosis not present

## 2021-03-23 DIAGNOSIS — Z818 Family history of other mental and behavioral disorders: Secondary | ICD-10-CM | POA: Diagnosis not present

## 2021-03-23 DIAGNOSIS — Z808 Family history of malignant neoplasm of other organs or systems: Secondary | ICD-10-CM | POA: Diagnosis not present

## 2021-03-23 DIAGNOSIS — C257 Malignant neoplasm of other parts of pancreas: Secondary | ICD-10-CM | POA: Diagnosis not present

## 2021-03-23 DIAGNOSIS — D6481 Anemia due to antineoplastic chemotherapy: Secondary | ICD-10-CM | POA: Diagnosis not present

## 2021-03-23 DIAGNOSIS — Z803 Family history of malignant neoplasm of breast: Secondary | ICD-10-CM | POA: Diagnosis not present

## 2021-03-23 DIAGNOSIS — Z5189 Encounter for other specified aftercare: Secondary | ICD-10-CM | POA: Diagnosis not present

## 2021-03-23 DIAGNOSIS — Z8249 Family history of ischemic heart disease and other diseases of the circulatory system: Secondary | ICD-10-CM | POA: Diagnosis not present

## 2021-03-23 DIAGNOSIS — C779 Secondary and unspecified malignant neoplasm of lymph node, unspecified: Secondary | ICD-10-CM | POA: Diagnosis not present

## 2021-03-23 DIAGNOSIS — Z87891 Personal history of nicotine dependence: Secondary | ICD-10-CM | POA: Diagnosis not present

## 2021-03-23 DIAGNOSIS — Z79899 Other long term (current) drug therapy: Secondary | ICD-10-CM | POA: Diagnosis not present

## 2021-03-23 DIAGNOSIS — R5383 Other fatigue: Secondary | ICD-10-CM | POA: Diagnosis not present

## 2021-03-23 DIAGNOSIS — Z833 Family history of diabetes mellitus: Secondary | ICD-10-CM | POA: Diagnosis not present

## 2021-03-23 DIAGNOSIS — T451X5A Adverse effect of antineoplastic and immunosuppressive drugs, initial encounter: Secondary | ICD-10-CM | POA: Diagnosis not present

## 2021-03-23 DIAGNOSIS — Z8042 Family history of malignant neoplasm of prostate: Secondary | ICD-10-CM | POA: Diagnosis not present

## 2021-03-23 DIAGNOSIS — C259 Malignant neoplasm of pancreas, unspecified: Secondary | ICD-10-CM

## 2021-03-23 MED ORDER — HEPARIN SOD (PORK) LOCK FLUSH 100 UNIT/ML IV SOLN
500.0000 [IU] | Freq: Once | INTRAVENOUS | Status: AC | PRN
  Administered 2021-03-23: 500 [IU]
  Filled 2021-03-23: qty 5

## 2021-03-23 MED ORDER — SODIUM CHLORIDE 0.9% FLUSH
10.0000 mL | INTRAVENOUS | Status: DC | PRN
  Administered 2021-03-23: 10 mL
  Filled 2021-03-23: qty 10

## 2021-03-23 NOTE — Patient Instructions (Signed)
[facility] [place]  Discharge Instructions: Thank you for choosing [facility] to provide your oncology and hematology care.  If you have a lab appointment with the [facility], please go directly to the [facility] and check in at the registration area.  Wear comfortable clothing and clothing appropriate for easy access to any Portacath or PICC line.   We strive to give you quality time with your provider. You may need to reschedule your appointment if you arrive late (15 or more minutes).  Arriving late affects you and other patients whose appointments are after yours.  Also, if you miss three or more appointments without notifying the office, you may be dismissed from the clinic at the providers discretion.      For prescription refill requests, have your pharmacy contact our office and allow 72 hours for refills to be completed.    Today you received the following chemotherapy and/or immunotherapy agents pump stop      To help prevent nausea and vomiting after your treatment, we encourage you to take your nausea medication as directed.  BELOW ARE SYMPTOMS THAT SHOULD BE REPORTED IMMEDIATELY: *FEVER GREATER THAN 100.4 F (38 C) OR HIGHER *CHILLS OR SWEATING *NAUSEA AND VOMITING THAT IS NOT CONTROLLED WITH YOUR NAUSEA MEDICATION *UNUSUAL SHORTNESS OF BREATH *UNUSUAL BRUISING OR BLEEDING *URINARY PROBLEMS (pain or burning when urinating, or frequent urination) *BOWEL PROBLEMS (unusual diarrhea, constipation, pain near the anus) TENDERNESS IN MOUTH AND THROAT WITH OR WITHOUT PRESENCE OF ULCERS (sore throat, sores in mouth, or a toothache) UNUSUAL RASH, SWELLING OR PAIN  UNUSUAL VAGINAL DISCHARGE OR ITCHING   Items with * indicate a potential emergency and should be followed up as soon as possible or go to the Emergency Department if any problems should occur.  Please show the CHEMOTHERAPY ALERT CARD or IMMUNOTHERAPY ALERT CARD at check-in to  the Emergency Department and triage nurse.  Should you have questions after your visit or need to cancel or reschedule your appointment, please contact [facility] [place] [facility] [place]  [phone] and follow the prompts.  Office hours are 8:00 a.m. to 4:30 p.m. Monday - Friday. Please note that voicemails left after 4:00 p.m. may not be returned until the following business day.  We are closed weekends and major holidays. You have access to a nurse at all times for urgent questions. Please call the main number to the clinic [phone] and follow the prompts.  For any non-urgent questions, you may also contact your provider using MyChart. We now offer e-Visits for anyone 79 and older to request care online for non-urgent symptoms. For details visit [email or web address].   Also download the MyChart app! Go to the app store, search "MyChart", open the app, select [facility], and log in with your MyChart username and password.  Due to Covid, a mask is required upon entering the hospital/clinic. If you do not have a mask, one will be given to you upon arrival. For doctor visits, patients may have 1 support person aged 52 or older with them. For treatment visits, patients cannot have anyone with them due to current Covid guidelines and our immunocompromised population.

## 2021-03-27 DIAGNOSIS — C259 Malignant neoplasm of pancreas, unspecified: Secondary | ICD-10-CM | POA: Diagnosis not present

## 2021-04-01 ENCOUNTER — Ambulatory Visit: Payer: BC Managed Care – PPO | Admitting: Oncology

## 2021-04-01 ENCOUNTER — Other Ambulatory Visit: Payer: BC Managed Care – PPO

## 2021-04-01 ENCOUNTER — Ambulatory Visit: Payer: BC Managed Care – PPO

## 2021-04-03 ENCOUNTER — Other Ambulatory Visit: Payer: Self-pay

## 2021-04-03 ENCOUNTER — Encounter: Payer: Self-pay | Admitting: Oncology

## 2021-04-03 ENCOUNTER — Inpatient Hospital Stay (HOSPITAL_BASED_OUTPATIENT_CLINIC_OR_DEPARTMENT_OTHER): Payer: BC Managed Care – PPO | Admitting: Oncology

## 2021-04-03 ENCOUNTER — Inpatient Hospital Stay: Payer: BC Managed Care – PPO

## 2021-04-03 VITALS — BP 109/57 | HR 75 | Temp 96.0°F | Resp 17 | Wt 110.0 lb

## 2021-04-03 DIAGNOSIS — E876 Hypokalemia: Secondary | ICD-10-CM

## 2021-04-03 DIAGNOSIS — Z808 Family history of malignant neoplasm of other organs or systems: Secondary | ICD-10-CM | POA: Diagnosis not present

## 2021-04-03 DIAGNOSIS — D6481 Anemia due to antineoplastic chemotherapy: Secondary | ICD-10-CM | POA: Diagnosis not present

## 2021-04-03 DIAGNOSIS — Z803 Family history of malignant neoplasm of breast: Secondary | ICD-10-CM | POA: Diagnosis not present

## 2021-04-03 DIAGNOSIS — Z5111 Encounter for antineoplastic chemotherapy: Secondary | ICD-10-CM | POA: Diagnosis not present

## 2021-04-03 DIAGNOSIS — C259 Malignant neoplasm of pancreas, unspecified: Secondary | ICD-10-CM

## 2021-04-03 DIAGNOSIS — T451X5A Adverse effect of antineoplastic and immunosuppressive drugs, initial encounter: Secondary | ICD-10-CM | POA: Diagnosis not present

## 2021-04-03 DIAGNOSIS — Z8042 Family history of malignant neoplasm of prostate: Secondary | ICD-10-CM | POA: Diagnosis not present

## 2021-04-03 DIAGNOSIS — Z79899 Other long term (current) drug therapy: Secondary | ICD-10-CM | POA: Diagnosis not present

## 2021-04-03 DIAGNOSIS — C779 Secondary and unspecified malignant neoplasm of lymph node, unspecified: Secondary | ICD-10-CM | POA: Diagnosis not present

## 2021-04-03 DIAGNOSIS — Z5189 Encounter for other specified aftercare: Secondary | ICD-10-CM | POA: Diagnosis not present

## 2021-04-03 DIAGNOSIS — Z833 Family history of diabetes mellitus: Secondary | ICD-10-CM | POA: Diagnosis not present

## 2021-04-03 DIAGNOSIS — Z8249 Family history of ischemic heart disease and other diseases of the circulatory system: Secondary | ICD-10-CM | POA: Diagnosis not present

## 2021-04-03 DIAGNOSIS — Z818 Family history of other mental and behavioral disorders: Secondary | ICD-10-CM | POA: Diagnosis not present

## 2021-04-03 DIAGNOSIS — R5383 Other fatigue: Secondary | ICD-10-CM | POA: Diagnosis not present

## 2021-04-03 DIAGNOSIS — Z87891 Personal history of nicotine dependence: Secondary | ICD-10-CM | POA: Diagnosis not present

## 2021-04-03 DIAGNOSIS — C257 Malignant neoplasm of other parts of pancreas: Secondary | ICD-10-CM | POA: Diagnosis not present

## 2021-04-03 DIAGNOSIS — Z811 Family history of alcohol abuse and dependence: Secondary | ICD-10-CM | POA: Diagnosis not present

## 2021-04-03 LAB — CBC WITH DIFFERENTIAL/PLATELET
Abs Immature Granulocytes: 0.01 10*3/uL (ref 0.00–0.07)
Basophils Absolute: 0 10*3/uL (ref 0.0–0.1)
Basophils Relative: 0 %
Eosinophils Absolute: 0.1 10*3/uL (ref 0.0–0.5)
Eosinophils Relative: 1 %
HCT: 30.4 % — ABNORMAL LOW (ref 36.0–46.0)
Hemoglobin: 10.3 g/dL — ABNORMAL LOW (ref 12.0–15.0)
Immature Granulocytes: 0 %
Lymphocytes Relative: 27 %
Lymphs Abs: 1.2 10*3/uL (ref 0.7–4.0)
MCH: 33.7 pg (ref 26.0–34.0)
MCHC: 33.9 g/dL (ref 30.0–36.0)
MCV: 99.3 fL (ref 80.0–100.0)
Monocytes Absolute: 0.5 10*3/uL (ref 0.1–1.0)
Monocytes Relative: 10 %
Neutro Abs: 2.8 10*3/uL (ref 1.7–7.7)
Neutrophils Relative %: 62 %
Platelets: 220 10*3/uL (ref 150–400)
RBC: 3.06 MIL/uL — ABNORMAL LOW (ref 3.87–5.11)
RDW: 13.8 % (ref 11.5–15.5)
WBC: 4.5 10*3/uL (ref 4.0–10.5)
nRBC: 0 % (ref 0.0–0.2)

## 2021-04-03 LAB — COMPREHENSIVE METABOLIC PANEL
ALT: 22 U/L (ref 0–44)
AST: 34 U/L (ref 15–41)
Albumin: 3.5 g/dL (ref 3.5–5.0)
Alkaline Phosphatase: 111 U/L (ref 38–126)
Anion gap: 9 (ref 5–15)
BUN: 7 mg/dL (ref 6–20)
CO2: 21 mmol/L — ABNORMAL LOW (ref 22–32)
Calcium: 8.8 mg/dL — ABNORMAL LOW (ref 8.9–10.3)
Chloride: 110 mmol/L (ref 98–111)
Creatinine, Ser: 0.89 mg/dL (ref 0.44–1.00)
GFR, Estimated: >60 mL/min (ref >60)
Glucose, Bld: 146 mg/dL — ABNORMAL HIGH (ref 70–99)
Potassium: 2.6 mmol/L — CL (ref 3.5–5.1)
Sodium: 140 mmol/L (ref 135–145)
Total Bilirubin: 0.1 mg/dL — ABNORMAL LOW (ref 0.3–1.2)
Total Protein: 6.9 g/dL (ref 6.5–8.1)

## 2021-04-03 MED ORDER — SODIUM CHLORIDE 0.9 % IV SOLN
150.0000 mg | Freq: Once | INTRAVENOUS | Status: AC
  Administered 2021-04-03: 150 mg via INTRAVENOUS
  Filled 2021-04-03: qty 150

## 2021-04-03 MED ORDER — SODIUM CHLORIDE 0.9 % IV SOLN
150.0000 mg/m2 | Freq: Once | INTRAVENOUS | Status: AC
  Administered 2021-04-03: 220 mg via INTRAVENOUS
  Filled 2021-04-03: qty 10

## 2021-04-03 MED ORDER — OXALIPLATIN CHEMO INJECTION 100 MG/20ML
65.0000 mg/m2 | Freq: Once | INTRAVENOUS | Status: AC
  Administered 2021-04-03: 100 mg via INTRAVENOUS
  Filled 2021-04-03: qty 20

## 2021-04-03 MED ORDER — ATROPINE SULFATE 1 MG/ML IV SOLN
0.5000 mg | Freq: Once | INTRAVENOUS | Status: AC | PRN
  Administered 2021-04-03: 0.5 mg via INTRAVENOUS
  Filled 2021-04-03: qty 1

## 2021-04-03 MED ORDER — POTASSIUM CHLORIDE CRYS ER 20 MEQ PO TBCR: 20.0000 meq | EXTENDED_RELEASE_TABLET | Freq: Two times a day (BID) | ORAL | 0 refills | Status: DC

## 2021-04-03 MED ORDER — DEXTROSE 5 % IV SOLN
Freq: Once | INTRAVENOUS | Status: AC
  Filled 2021-04-03: qty 250

## 2021-04-03 MED ORDER — SODIUM CHLORIDE 0.9 % IV SOLN
INTRAVENOUS | Status: DC
  Filled 2021-04-03: qty 250

## 2021-04-03 MED ORDER — PALONOSETRON HCL INJECTION 0.25 MG/5ML
0.2500 mg | Freq: Once | INTRAVENOUS | Status: AC
  Administered 2021-04-03: 0.25 mg via INTRAVENOUS
  Filled 2021-04-03: qty 5

## 2021-04-03 MED ORDER — SODIUM CHLORIDE 0.9 % IV SOLN
10.0000 mg | Freq: Once | INTRAVENOUS | Status: AC
  Administered 2021-04-03: 10 mg via INTRAVENOUS
  Filled 2021-04-03: qty 10

## 2021-04-03 MED ORDER — SODIUM CHLORIDE 0.9% FLUSH
10.0000 mL | INTRAVENOUS | Status: DC | PRN
  Administered 2021-04-03: 10 mL via INTRAVENOUS
  Filled 2021-04-03: qty 10

## 2021-04-03 MED ORDER — SODIUM CHLORIDE 0.9 % IV SOLN
600.0000 mg | Freq: Once | INTRAVENOUS | Status: AC
  Administered 2021-04-03: 600 mg via INTRAVENOUS
  Filled 2021-04-03: qty 30

## 2021-04-03 MED ORDER — SODIUM CHLORIDE 0.9 % IV SOLN
2400.0000 mg/m2 | INTRAVENOUS | Status: DC
  Administered 2021-04-03: 3600 mg via INTRAVENOUS
  Filled 2021-04-03: qty 72

## 2021-04-03 MED ORDER — POTASSIUM CHLORIDE IN NACL 20-0.9 MEQ/L-% IV SOLN
Freq: Once | INTRAVENOUS | Status: AC
  Filled 2021-04-03: qty 1000

## 2021-04-03 NOTE — Patient Instructions (Signed)
[facility] CANCER CTR AT [place]  Discharge Instructions: Thank you for choosing [facility] to provide your oncology and hematology care.  If you have a lab appointment with the [facility], please go directly to the [place] and check in at the registration area.  Wear comfortable clothing and clothing appropriate for easy access to any Portacath or PICC line.   We strive to give you quality time with your provider. You may need to reschedule your appointment if you arrive late (15 or more minutes).  Arriving late affects you and other patients whose appointments are after yours.  Also, if you miss three or more appointments without notifying the office, you may be dismissed from the clinic at the providers discretion.      For prescription refill requests, have your pharmacy contact our office and allow 72 hours for refills to be completed.    Today you received the following chemotherapy and/or immunotherapy agents : Folfirinox   To help prevent nausea and vomiting after your treatment, we encourage you to take your nausea medication as directed.  BELOW ARE SYMPTOMS THAT SHOULD BE REPORTED IMMEDIATELY: *FEVER GREATER THAN 100.4 F (38 C) OR HIGHER *CHILLS OR SWEATING *NAUSEA AND VOMITING THAT IS NOT CONTROLLED WITH YOUR NAUSEA MEDICATION *UNUSUAL SHORTNESS OF BREATH *UNUSUAL BRUISING OR BLEEDING *URINARY PROBLEMS (pain or burning when urinating, or frequent urination) *BOWEL PROBLEMS (unusual diarrhea, constipation, pain near the anus) TENDERNESS IN MOUTH AND THROAT WITH OR WITHOUT PRESENCE OF ULCERS (sore throat, sores in mouth, or a toothache) UNUSUAL RASH, SWELLING OR PAIN  UNUSUAL VAGINAL DISCHARGE OR ITCHING   Items with * indicate a potential emergency and should be followed up as soon as possible or go to the Emergency Department if any problems should occur.  Please show the CHEMOTHERAPY ALERT CARD or IMMUNOTHERAPY ALERT CARD at check-in to  the Emergency Department and triage nurse.  Should you have questions after your visit or need to cancel or reschedule your appointment, please contact [facility] CANCER [place] AT [place]  [phone] and follow the prompts.  Office hours are 8:00 a.m. to 4:30 p.m. Monday - Friday. Please note that voicemails left after 4:00 p.m. may not be returned until the following business day.  We are closed weekends and major holidays. You have access to a nurse at all times for urgent questions. Please call the main number to the clinic [phone] and follow the prompts.  For any non-urgent questions, you may also contact your provider using MyChart. We now offer e-Visits for anyone 55 and older to request care online for non-urgent symptoms. For details visit [email or web address].   Also download the MyChart app! Go to the app store, search "MyChart", open the app, select [place], and log in with your MyChart username and password.  Due to Covid, a mask is required upon entering the hospital/clinic. If you do not have a mask, one will be given to you upon arrival. For doctor visits, patients may have 1 support person aged 2 or older with them. For treatment visits, patients cannot have anyone with them due to current Covid guidelines and our immunocompromised population.

## 2021-04-03 NOTE — Progress Notes (Signed)
Hematology/Oncology Consult note [facility]  Telephone:([phone] [phone]  Patient Care Team: Cletis Athens, MD as PCP - General (Internal Medicine) Grant Fontana, [place] as Referring Physician (Chiropractic Medicine) Christene Lye, MD (General Surgery) Sindy Guadeloupe, MD as Consulting Physician (Hematology and Oncology)   Name of the patient: [facility]  [id number]  [birth year]   Date of visit: 04/03/21  Diagnosis- pancreatic adenocarcinoma stage II apT2 N1 M0  Chief complaint/ Reason for visit-on treatment assessment prior to cycle 10 of modified FOLFIRINOX chemotherapy adjuvant  Heme/Onc history:  patient is a 61 year old female with a history of irritable bowel disease for which she follows up with [facility].  She has undergone EGD and colonoscopy with them in the past.  More recently patient was complaining of left upper quadrant and epigastric abdominal pain which causes sudden episodes of abdominal spasms and was prescribed as needed tramadol for it.  These episodes of pain are unrelated to food and are dull aching or throbbing.  She has undergone cholecystectomy in the past.  She underwent CT abdomen and pelvis with contrast in the ER on 08/04/2020 Which did not reveal any acute pathology which showed a possible hypoechoic 1.9 cm right hepatic lobe lesion.  This was followed by an MRI which showed no discrete lesion in the liver and the area of concern noted on CT scan was more likely to represent variable fat deposition.  However there was loss of normal T1 signal in the peripheral pancreas and a potential small pancreatic lesion suspicious for early pancreatic adenocarcinoma in the neck of the pancreas.     Patient underwent EUS by Dr. Mont Dutton which showed an irregular mass in the pancreatic neck measuring 15 x 11 mm.  FNA showed adenocarcinoma.  Pancreatic duct measured 1.1 mm in the head with abrupt dilatation in the region of the mass  to 4.1 mm.  No abnormality noted in the common bile duct and hepatic duct.  Celiac region showed no significant endosonographic abnormality.  No lymphadenopathy was seen.  CA 19-9 mildly elevated at 35   Patient was seen by Dr. Hyman Hopes from pancreaticobiliary surgery at [facility] for surgical opinion.  Although patient has a small lesion involving the pancreatic neck, there was a concern for tumor extension towards the splenic vein and narrowing of portal splenic confluence.  Patient received 3 months of neoadjuvant modified FOLFIRINOX chemotherapyAnd then underwent definitive Whipple surgery at [facility] with Dr. Hyman Hopes on 01/13/2021.  Final pathology showed 2.3 cm grade 2 moderately differentiated adenocarcinoma with lymphovascular and perineural invasion.  Treatment effect present with residual cancer showing evident tumor regression but more than single cells or rare small group of cancer cells] partial response.  Margins negative.  1 out of 22 lymph nodes positive for malignancy.  Interval history-   Appetite is fair and she has unable to gain weight.  In fact she has lost 2 pounds in the last 6 weeks.  Denies any significant abdominal pain.  She is seeing Dr. Hyman Hopes for small area of open wound which is being treated with packing.  ECOG PS- 1 Pain scale- 0   Review of systems- Review of Systems  Constitutional:  Positive for malaise/fatigue. Negative for chills, fever and weight loss.       Loss of appetite  HENT:  Negative for congestion, ear discharge and nosebleeds.   Eyes:  Negative for blurred vision.  Respiratory:  Negative for cough, hemoptysis, sputum production, shortness of breath and  wheezing.   Cardiovascular:  Negative for chest pain, palpitations, orthopnea and claudication.  Gastrointestinal:  Negative for abdominal pain, blood in stool, constipation, diarrhea, heartburn, melena, nausea and vomiting.  Genitourinary:  Negative for dysuria, flank pain, frequency, hematuria and urgency.   Musculoskeletal:  Negative for back pain, joint pain and myalgias.  Skin:  Negative for rash.  Neurological:  Negative for dizziness, tingling, focal weakness, seizures, weakness and headaches.  Endo/Heme/Allergies:  Does not bruise/bleed easily.  Psychiatric/Behavioral:  Negative for depression and suicidal ideas. The patient does not have insomnia.       Allergies  Allergen Reactions   Sulfacetamide Sodium Swelling    Diarrhea,dizziness, tongue swelling   Other Other (See Comments) and Swelling    PT states that anything with the sides effects "may cause rash" she cannot take due to her psoriasis, it will make the patches on her hand so thick she can't use her hands.  Diarrhea,dizziness, tongue swelling   Oxycontin [Oxycodone Hcl] Nausea And Vomiting   Sulfa Antibiotics Swelling    Diarrhea,dizziness, tongue swelling   Abilify [Aripiprazole] Other (See Comments)   Cephalosporins Rash   Erythromycin Other (See Comments)    Abd. pain Abd. pain Other reaction(s): Other (See Comments) Abd. 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    Bipolar disorder (HCC)    Chest pain, unspecified    Chronic low back pain 10/23/2013   Chronic UTI    COPD (chronic obstructive pulmonary disease) (HCC)    Depression    Dysfunction of eustachian tube    Enlargement of lymph nodes    Family history of brain cancer    Family history of breast cancer    Family history of prostate cancer    History of Bell's palsy Right   Injury, other and unspecified, knee, leg, ankle, and foot    MRSA (methicillin resistant staph aureus) culture positive 07/11/2014   abscess   Other specified disease of hair and hair follicles    Pancreatic cancer ([place])    Pancreatic cancer ([place])     Rectal prolapse    Unspecified symptom associated with female genital organs      Past Surgical History:  Procedure Laterality Date   ABDOMINAL HYSTERECTOMY     bladder tack  2010   CHOLECYSTECTOMY  2002   FOOT SURGERY Bilateral 2004   bunion   GANGLION CYST EXCISION  2002   INCISION AND DRAINAGE ABSCESS  04-16-15   PARTIAL HYSTERECTOMY  2010   uterus removed   PORTA CATH INSERTION N/A 09/11/2020   Procedure: PORTA CATH INSERTION;  Surgeon: Algernon Huxley, MD;  Location: [facility];  Service: Cardiovascular;  Laterality: N/A;    Social History   Socioeconomic History   Marital status: Married    Spouse name: Not on file   Number of children: 3   Years of education: hs   Highest education level: Some college, no degree  Occupational History   Not on file  Tobacco Use   Smoking status: Former    Packs/day: 0.50    Years: 40.00    Pack years: 20.00    Types: Cigarettes    Quit date: 07/14/2020    Years since quitting: 0.7  Smokeless tobacco: Never  Vaping Use   Vaping Use: Never used  Substance and Sexual Activity   Alcohol use: No    Alcohol/week: 1.0 standard drink    Types: 1 Glasses of wine per week    Comment: occasional   Drug use: Yes    Types: Marijuana    Comment: last used 05-20-15   Sexual activity: Yes    Birth control/protection: None  Other Topics Concern   Not on file  Social History Narrative   Patient lives at home with her husband [name]).   Patient works full time.   Education CNA    Right handed.   Caffeine Tea , Soda.   Social Determinants of Health   Financial Resource Strain: Not on file  Food Insecurity: Not on file  Transportation Needs: Not on file  Physical Activity: Not on file  Stress: Not on file  Social Connections: Not on file  Intimate Partner Violence: Not on file    Family History  Problem Relation Age of Onset   Diabetes Father    Cancer Father        brain tumor   Depression Father    Depression Sister     Alcohol abuse Brother    Depression Brother    Leukemia Maternal Aunt    Prostate cancer Maternal Uncle    Breast cancer Paternal Aunt        dx >50   Heart disease Maternal Grandmother    Heart attack Maternal Grandmother    Stroke Maternal Grandmother    Parkinson's disease Maternal Grandfather    Leukemia Paternal Grandmother    Diabetes Paternal Grandfather    Breast cancer Cousin    Brain cancer Niece        dx under 50     Current Outpatient Medications:    ALPRAZolam (XANAX) 0.5 MG tablet, Take 1 tablet (0.5 mg total) by mouth 2 (two) times daily as needed for anxiety., Disp: 20 tablet, Rfl: 1   Ascorbic Acid (VITAMIN C) 1000 MG tablet, Take 1,000 mg by mouth daily., Disp: , Rfl:    dexamethasone (DECADRON) 4 MG tablet, Take 2 tablets (8 mg total) by mouth daily. Start the day after chemotherapy for 3 days. Take with food., Disp: 8 tablet, Rfl: 5   [START ON 04/20/2021] divalproex (DEPAKOTE) 250 MG DR tablet, Take 1 tablet (250 mg total) by mouth 2 (two) times daily., Disp: 180 tablet, Rfl: 1   famotidine (PEPCID) 20 MG tablet, Take 20 mg by mouth daily., Disp: , Rfl:    famotidine (PEPCID) 20 MG tablet, Take 1 tablet by mouth 2 (two) times daily., Disp: , Rfl:    ferrous sulfate 325 (65 FE) MG tablet, Take 325 mg by mouth 2 (two) times daily with a meal., Disp: , Rfl:    fluticasone (FLONASE) 50 MCG/ACT nasal spray, Place 1 spray into both nostrils daily., Disp: 11.1 mL, Rfl: 2   loperamide (IMODIUM A-D) 2 MG tablet, Take 2 at onset of diarrhea, then 1 every 2hrs until 12hr without a BM. May take 2 tab every 4hrs at bedtime. If diarrhea recurs repeat., Disp: 100 tablet, Rfl: 1   loratadine (CLARITIN) 10 MG tablet, TAKE 1 TABLET BY MOUTH EVERY DAY, Disp: 90 tablet, Rfl: 1   Multiple Vitamin (MULTIVITAMIN) tablet, Take 1 tablet by mouth daily., Disp: , Rfl:    naproxen sodium (ALEVE) 220 MG tablet, Take 220 mg by mouth daily as needed., Disp: , Rfl:    ondansetron (  ZOFRAN) 8 MG  tablet, Take 1 tablet (8 mg total) by mouth 2 (two) times daily as needed. Start on day 3 after chemotherapy., Disp: 30 tablet, Rfl: 1   ondansetron (ZOFRAN-ODT) 4 MG disintegrating tablet, Take 4 mg by mouth every 8 (eight) hours as needed., Disp: , Rfl:    pantoprazole (PROTONIX) 40 MG tablet, Take by mouth., Disp: , Rfl:    prochlorperazine (COMPAZINE) 10 MG tablet, Take 1 tablet (10 mg total) by mouth every 6 (six) hours as needed (Nausea or vomiting)., Disp: 30 tablet, Rfl: 1   [START ON 04/20/2021] propranolol (INDERAL) 20 MG tablet, Take 1 tablet (20 mg total) by mouth 3 (three) times daily., Disp: 270 tablet, Rfl: 1   enoxaparin (LOVENOX) 30 MG/0.3ML injection, SMARTSIG:0.3 Milliliter(s) SUB-Q Daily (Patient not taking: Reported on 03/12/2021), Disp: , Rfl:    HYDROmorphone (DILAUDID) 2 MG tablet, Take by mouth. (Patient not taking: Reported on 03/12/2021), Disp: , Rfl:    OLANZapine (ZYPREXA) 10 MG tablet, TAKE 1 TABLET BY MOUTH EVERYDAY AT BEDTIME (Patient not taking: Reported on 03/12/2021), Disp: 90 tablet, Rfl: 1   potassium chloride SA (KLOR-CON M20) 20 MEQ tablet, Take 1 tablet (20 mEq total) by mouth 2 (two) times daily., Disp: 14 tablet, Rfl: 0 No current facility-administered medications for this visit.  Facility-Administered Medications Ordered in Other Visits:    0.9 %  sodium chloride infusion, , Intravenous, Continuous, Sindy Guadeloupe, MD, Last Rate: 10 mL/hr at 04/03/21 0927, New Bag at 04/03/21 0927   fluorouracil (ADRUCIL) 3,600 mg in sodium chloride 0.9 % 78 mL chemo infusion, 2,400 mg/m2 (Treatment Plan Recorded), Intravenous, 1 day or 1 dose, Sindy Guadeloupe, MD   heparin lock flush 100 UNIT/ML injection, , , ,    irinotecan (CAMPTOSAR) 220 mg in sodium chloride 0.9 % 500 mL chemo infusion, 150 mg/m2 (Treatment Plan Recorded), Intravenous, Once, Sindy Guadeloupe, MD, Last Rate: 341 mL/hr at 04/03/21 1329, 220 mg at 04/03/21 1329   leucovorin 600 mg in sodium chloride 0.9 % 250 mL  infusion, 600 mg, Intravenous, Once, Sindy Guadeloupe, MD, Last Rate: 187 mL/hr at 04/03/21 1329, 600 mg at 04/03/21 1329   sodium chloride flush (NS) 0.9 % injection 10 mL, 10 mL, Intracatheter, PRN, Sindy Guadeloupe, MD, 10 mL at 11/03/20 1441   sodium chloride flush (NS) 0.9 % injection 10 mL, 10 mL, Intravenous, PRN, Sindy Guadeloupe, MD, 10 mL at 04/03/21 9563  Physical exam:  Vitals:   04/03/21 0837  BP: (!) 109/57  Pulse: 75  Resp: 17  Temp: (!) 96 F (35.6 C)  TempSrc: Tympanic  SpO2: 100%  Weight: 110 lb (49.9 kg)   Physical Exam Cardiovascular:     Rate and Rhythm: Normal rate and regular rhythm.     Heart sounds: Normal heart sounds.  Pulmonary:     Effort: Pulmonary effort is normal.     Breath sounds: Normal breath sounds.  Abdominal:     General: Bowel sounds are normal.     Palpations: Abdomen is soft.     Comments: There is a midline surgical scar which is overall well-healed but the lowermost 1 cm part of the wound has packing in it with serous discharge  Skin:    General: Skin is warm and dry.  Neurological:     Mental Status: She is alert and oriented to person, place, and time.     CMP Latest Ref Rng & Units 04/03/2021  Glucose 70 - 99  mg/dL 146(H)  BUN 6 - 20 mg/dL 7  Creatinine 0.44 - 1.00 mg/dL 0.89  Sodium 135 - 145 mmol/L 140  Potassium 3.5 - 5.1 mmol/L 2.6(LL)  Chloride 98 - 111 mmol/L 110  CO2 22 - 32 mmol/L 21(L)  Calcium 8.9 - 10.3 mg/dL 8.8(L)  Total Protein 6.5 - 8.1 g/dL 6.9  Total Bilirubin 0.3 - 1.2 mg/dL 0.1(L)  Alkaline Phos 38 - 126 U/L 111  AST 15 - 41 U/L 34  ALT 0 - 44 U/L 22   CBC Latest Ref Rng & Units 04/03/2021  WBC 4.0 - 10.5 K/uL 4.5  Hemoglobin 12.0 - 15.0 g/dL 10.3(L)  Hematocrit 36.0 - 46.0 % 30.4(L)  Platelets 150 - 400 K/uL 220     Assessment and plan- Patient is a 61 y.o. female with pancreatic adenocarcinoma s/p 3 months of neoadjuvant modified FOLFIRINOX chemotherapy s/p Whipple's.  Final pathology showed T2N1  disease.  She is here for on treatment assessment prior to cycle 10 of adjuvant modified FOLFIRINOX chemotherapy  Other than chemo induced anemia which has remained overall stable patient is otherwise okay to proceed with cycle 10 of modified FOLFIRINOX chemotherapy today.  Pump disconnect on day 4 with Udenyca.  I will see her back in 2 weeks for cycle 11.  Pump disconnect on day 4  Hypokalemia: We will give 1 L of IV fluids today with 20 mEq of IV potassium.  Repeat BMP on day 4 when she comes for urine need for for more IV potassium.   Visit Diagnosis 1. Pancreatic adenocarcinoma ([place])   2. Hypokalemia   3. Encounter for antineoplastic chemotherapy      Dr. Randa Evens, MD, MPH [facility] at [facility] [phone] 04/03/2021 1:38 PM

## 2021-04-03 NOTE — Progress Notes (Signed)
Patient here for oncology follow-up appointment, expresses complaints  of diarrhea

## 2021-04-06 ENCOUNTER — Other Ambulatory Visit: Payer: Self-pay

## 2021-04-06 ENCOUNTER — Inpatient Hospital Stay: Payer: BC Managed Care – PPO

## 2021-04-06 VITALS — BP 110/61 | HR 77 | Temp 97.0°F | Resp 19

## 2021-04-06 DIAGNOSIS — R5383 Other fatigue: Secondary | ICD-10-CM | POA: Diagnosis not present

## 2021-04-06 DIAGNOSIS — Z833 Family history of diabetes mellitus: Secondary | ICD-10-CM | POA: Diagnosis not present

## 2021-04-06 DIAGNOSIS — Z5189 Encounter for other specified aftercare: Secondary | ICD-10-CM | POA: Diagnosis not present

## 2021-04-06 DIAGNOSIS — C779 Secondary and unspecified malignant neoplasm of lymph node, unspecified: Secondary | ICD-10-CM | POA: Diagnosis not present

## 2021-04-06 DIAGNOSIS — Z811 Family history of alcohol abuse and dependence: Secondary | ICD-10-CM | POA: Diagnosis not present

## 2021-04-06 DIAGNOSIS — T451X5A Adverse effect of antineoplastic and immunosuppressive drugs, initial encounter: Secondary | ICD-10-CM | POA: Diagnosis not present

## 2021-04-06 DIAGNOSIS — E876 Hypokalemia: Secondary | ICD-10-CM

## 2021-04-06 DIAGNOSIS — Z5111 Encounter for antineoplastic chemotherapy: Secondary | ICD-10-CM | POA: Diagnosis not present

## 2021-04-06 DIAGNOSIS — Z87891 Personal history of nicotine dependence: Secondary | ICD-10-CM | POA: Diagnosis not present

## 2021-04-06 DIAGNOSIS — Z8249 Family history of ischemic heart disease and other diseases of the circulatory system: Secondary | ICD-10-CM | POA: Diagnosis not present

## 2021-04-06 DIAGNOSIS — Z808 Family history of malignant neoplasm of other organs or systems: Secondary | ICD-10-CM | POA: Diagnosis not present

## 2021-04-06 DIAGNOSIS — D6481 Anemia due to antineoplastic chemotherapy: Secondary | ICD-10-CM | POA: Diagnosis not present

## 2021-04-06 DIAGNOSIS — Z803 Family history of malignant neoplasm of breast: Secondary | ICD-10-CM | POA: Diagnosis not present

## 2021-04-06 DIAGNOSIS — C257 Malignant neoplasm of other parts of pancreas: Secondary | ICD-10-CM | POA: Diagnosis not present

## 2021-04-06 DIAGNOSIS — Z818 Family history of other mental and behavioral disorders: Secondary | ICD-10-CM | POA: Diagnosis not present

## 2021-04-06 DIAGNOSIS — Z79899 Other long term (current) drug therapy: Secondary | ICD-10-CM | POA: Diagnosis not present

## 2021-04-06 DIAGNOSIS — C259 Malignant neoplasm of pancreas, unspecified: Secondary | ICD-10-CM

## 2021-04-06 DIAGNOSIS — Z8042 Family history of malignant neoplasm of prostate: Secondary | ICD-10-CM | POA: Diagnosis not present

## 2021-04-06 LAB — BASIC METABOLIC PANEL
Anion gap: 10 (ref 5–15)
BUN: 10 mg/dL (ref 6–20)
CO2: 22 mmol/L (ref 22–32)
Calcium: 8.2 mg/dL — ABNORMAL LOW (ref 8.9–10.3)
Chloride: 105 mmol/L (ref 98–111)
Creatinine, Ser: 0.86 mg/dL (ref 0.44–1.00)
GFR, Estimated: >60 mL/min (ref >60)
Glucose, Bld: 128 mg/dL — ABNORMAL HIGH (ref 70–99)
Potassium: 3.7 mmol/L (ref 3.5–5.1)
Sodium: 137 mmol/L (ref 135–145)

## 2021-04-06 MED ORDER — HEPARIN SOD (PORK) LOCK FLUSH 100 UNIT/ML IV SOLN
INTRAVENOUS | Status: AC
  Filled 2021-04-06: qty 5

## 2021-04-06 MED ORDER — SODIUM CHLORIDE 0.9% FLUSH
10.0000 mL | Freq: Once | INTRAVENOUS | Status: AC
  Administered 2021-04-06: 10 mL via INTRAVENOUS
  Filled 2021-04-06: qty 10

## 2021-04-06 MED ORDER — SODIUM CHLORIDE 0.9% FLUSH
10.0000 mL | INTRAVENOUS | Status: DC | PRN
  Filled 2021-04-06: qty 10

## 2021-04-06 MED ORDER — PEGFILGRASTIM-CBQV 6 MG/0.6ML ~~LOC~~ SOSY
6.0000 mg | PREFILLED_SYRINGE | Freq: Once | SUBCUTANEOUS | Status: AC
  Administered 2021-04-06: 6 mg via SUBCUTANEOUS
  Filled 2021-04-06: qty 0.6

## 2021-04-06 MED ORDER — HEPARIN SOD (PORK) LOCK FLUSH 100 UNIT/ML IV SOLN
500.0000 [IU] | Freq: Once | INTRAVENOUS | Status: DC | PRN
  Filled 2021-04-06: qty 5

## 2021-04-06 NOTE — Progress Notes (Signed)
Labs reviewed with MD. Per MD pt does not need extra potassium today and continue taking 2 potassium tablets a day until the prescription runs out and then the medical team will send in another prescription for her to take 1 potassium tablet a day. Pt updated and all questions answered at this time. VSS. Pt stable at discharge.   [facility] [employer]

## 2021-04-10 DIAGNOSIS — C259 Malignant neoplasm of pancreas, unspecified: Secondary | ICD-10-CM | POA: Diagnosis not present

## 2021-04-12 DIAGNOSIS — C259 Malignant neoplasm of pancreas, unspecified: Secondary | ICD-10-CM | POA: Diagnosis not present

## 2021-04-13 ENCOUNTER — Other Ambulatory Visit: Payer: Self-pay | Admitting: Oncology

## 2021-04-13 NOTE — Telephone Encounter (Signed)
?  Component Ref Range & Units 7 d ago ?(04/06/21) 10 d ago ?(04/03/21) 3 wk ago ?(03/18/21) 1 mo ago ?(02/27/21) 1 mo ago ?(02/13/21) 4 mo ago ?(12/12/20) 4 mo ago ?(11/28/20)  ?Potassium 3.5 - 5.1 mmol/L 3.7  2.6 Low Panic  CM  3.4 Low   3.8  2.9 Low   4.0  3.9   ?  ? ?

## 2021-04-17 ENCOUNTER — Inpatient Hospital Stay: Payer: BC Managed Care – PPO | Attending: Oncology

## 2021-04-17 ENCOUNTER — Telehealth: Payer: Self-pay | Admitting: *Deleted

## 2021-04-17 ENCOUNTER — Other Ambulatory Visit: Payer: Self-pay

## 2021-04-17 ENCOUNTER — Encounter: Payer: Self-pay | Admitting: Oncology

## 2021-04-17 ENCOUNTER — Inpatient Hospital Stay: Payer: BC Managed Care – PPO

## 2021-04-17 ENCOUNTER — Other Ambulatory Visit: Payer: Self-pay | Admitting: *Deleted

## 2021-04-17 ENCOUNTER — Inpatient Hospital Stay (HOSPITAL_BASED_OUTPATIENT_CLINIC_OR_DEPARTMENT_OTHER): Payer: BC Managed Care – PPO | Admitting: Oncology

## 2021-04-17 VITALS — BP 104/58 | HR 73 | Temp 97.6°F | Resp 18 | Wt 108.4 lb

## 2021-04-17 DIAGNOSIS — Z823 Family history of stroke: Secondary | ICD-10-CM | POA: Diagnosis not present

## 2021-04-17 DIAGNOSIS — Z885 Allergy status to narcotic agent status: Secondary | ICD-10-CM | POA: Insufficient documentation

## 2021-04-17 DIAGNOSIS — E876 Hypokalemia: Secondary | ICD-10-CM | POA: Diagnosis not present

## 2021-04-17 DIAGNOSIS — R1013 Epigastric pain: Secondary | ICD-10-CM | POA: Insufficient documentation

## 2021-04-17 DIAGNOSIS — C257 Malignant neoplasm of other parts of pancreas: Secondary | ICD-10-CM | POA: Diagnosis not present

## 2021-04-17 DIAGNOSIS — Z808 Family history of malignant neoplasm of other organs or systems: Secondary | ICD-10-CM | POA: Diagnosis not present

## 2021-04-17 DIAGNOSIS — K589 Irritable bowel syndrome without diarrhea: Secondary | ICD-10-CM | POA: Diagnosis not present

## 2021-04-17 DIAGNOSIS — Z881 Allergy status to other antibiotic agents status: Secondary | ICD-10-CM | POA: Insufficient documentation

## 2021-04-17 DIAGNOSIS — Z87891 Personal history of nicotine dependence: Secondary | ICD-10-CM | POA: Diagnosis not present

## 2021-04-17 DIAGNOSIS — R110 Nausea: Secondary | ICD-10-CM | POA: Insufficient documentation

## 2021-04-17 DIAGNOSIS — Z7901 Long term (current) use of anticoagulants: Secondary | ICD-10-CM | POA: Diagnosis not present

## 2021-04-17 DIAGNOSIS — Z87440 Personal history of urinary (tract) infections: Secondary | ICD-10-CM | POA: Insufficient documentation

## 2021-04-17 DIAGNOSIS — R5383 Other fatigue: Secondary | ICD-10-CM | POA: Insufficient documentation

## 2021-04-17 DIAGNOSIS — Z806 Family history of leukemia: Secondary | ICD-10-CM | POA: Insufficient documentation

## 2021-04-17 DIAGNOSIS — F3178 Bipolar disorder, in full remission, most recent episode mixed: Secondary | ICD-10-CM | POA: Diagnosis not present

## 2021-04-17 DIAGNOSIS — Z79899 Other long term (current) drug therapy: Secondary | ICD-10-CM | POA: Diagnosis not present

## 2021-04-17 DIAGNOSIS — C259 Malignant neoplasm of pancreas, unspecified: Secondary | ICD-10-CM

## 2021-04-17 DIAGNOSIS — Z882 Allergy status to sulfonamides status: Secondary | ICD-10-CM | POA: Diagnosis not present

## 2021-04-17 DIAGNOSIS — Z8249 Family history of ischemic heart disease and other diseases of the circulatory system: Secondary | ICD-10-CM | POA: Diagnosis not present

## 2021-04-17 DIAGNOSIS — Z9049 Acquired absence of other specified parts of digestive tract: Secondary | ICD-10-CM | POA: Diagnosis not present

## 2021-04-17 DIAGNOSIS — Z5111 Encounter for antineoplastic chemotherapy: Secondary | ICD-10-CM | POA: Diagnosis not present

## 2021-04-17 DIAGNOSIS — R1012 Left upper quadrant pain: Secondary | ICD-10-CM | POA: Insufficient documentation

## 2021-04-17 DIAGNOSIS — T451X5A Adverse effect of antineoplastic and immunosuppressive drugs, initial encounter: Secondary | ICD-10-CM

## 2021-04-17 DIAGNOSIS — R197 Diarrhea, unspecified: Secondary | ICD-10-CM | POA: Insufficient documentation

## 2021-04-17 DIAGNOSIS — Z818 Family history of other mental and behavioral disorders: Secondary | ICD-10-CM | POA: Insufficient documentation

## 2021-04-17 DIAGNOSIS — Z811 Family history of alcohol abuse and dependence: Secondary | ICD-10-CM | POA: Insufficient documentation

## 2021-04-17 DIAGNOSIS — Z5189 Encounter for other specified aftercare: Secondary | ICD-10-CM | POA: Diagnosis not present

## 2021-04-17 DIAGNOSIS — Z803 Family history of malignant neoplasm of breast: Secondary | ICD-10-CM | POA: Insufficient documentation

## 2021-04-17 DIAGNOSIS — Z888 Allergy status to other drugs, medicaments and biological substances status: Secondary | ICD-10-CM | POA: Diagnosis not present

## 2021-04-17 DIAGNOSIS — Z8042 Family history of malignant neoplasm of prostate: Secondary | ICD-10-CM | POA: Insufficient documentation

## 2021-04-17 DIAGNOSIS — Z833 Family history of diabetes mellitus: Secondary | ICD-10-CM | POA: Insufficient documentation

## 2021-04-17 LAB — CBC WITH DIFFERENTIAL/PLATELET
Abs Immature Granulocytes: 0.28 10*3/uL — ABNORMAL HIGH (ref 0.00–0.07)
Basophils Absolute: 0 10*3/uL (ref 0.0–0.1)
Basophils Relative: 0 %
Eosinophils Absolute: 0.1 10*3/uL (ref 0.0–0.5)
Eosinophils Relative: 1 %
HCT: 32.9 % — ABNORMAL LOW (ref 36.0–46.0)
Hemoglobin: 11.1 g/dL — ABNORMAL LOW (ref 12.0–15.0)
Immature Granulocytes: 3 %
Lymphocytes Relative: 19 %
Lymphs Abs: 2.2 10*3/uL (ref 0.7–4.0)
MCH: 33.4 pg (ref 26.0–34.0)
MCHC: 33.7 g/dL (ref 30.0–36.0)
MCV: 99.1 fL (ref 80.0–100.0)
Monocytes Absolute: 1 10*3/uL (ref 0.1–1.0)
Monocytes Relative: 9 %
Neutro Abs: 7.8 10*3/uL — ABNORMAL HIGH (ref 1.7–7.7)
Neutrophils Relative %: 68 %
Platelets: 146 10*3/uL — ABNORMAL LOW (ref 150–400)
RBC: 3.32 MIL/uL — ABNORMAL LOW (ref 3.87–5.11)
RDW: 14.6 % (ref 11.5–15.5)
WBC: 11.4 10*3/uL — ABNORMAL HIGH (ref 4.0–10.5)
nRBC: 0 % (ref 0.0–0.2)

## 2021-04-17 LAB — COMPREHENSIVE METABOLIC PANEL
ALT: 36 U/L (ref 0–44)
AST: 49 U/L — ABNORMAL HIGH (ref 15–41)
Albumin: 3.2 g/dL — ABNORMAL LOW (ref 3.5–5.0)
Alkaline Phosphatase: 188 U/L — ABNORMAL HIGH (ref 38–126)
Anion gap: 8 (ref 5–15)
BUN: 8 mg/dL (ref 6–20)
CO2: 23 mmol/L (ref 22–32)
Calcium: 8.5 mg/dL — ABNORMAL LOW (ref 8.9–10.3)
Chloride: 105 mmol/L (ref 98–111)
Creatinine, Ser: 0.82 mg/dL (ref 0.44–1.00)
GFR, Estimated: >60 mL/min (ref >60)
Glucose, Bld: 120 mg/dL — ABNORMAL HIGH (ref 70–99)
Potassium: 3.1 mmol/L — ABNORMAL LOW (ref 3.5–5.1)
Sodium: 136 mmol/L (ref 135–145)
Total Bilirubin: 0.4 mg/dL (ref 0.3–1.2)
Total Protein: 6.7 g/dL (ref 6.5–8.1)

## 2021-04-17 MED ORDER — SODIUM CHLORIDE 0.9 % IV SOLN
Freq: Once | INTRAVENOUS | Status: AC
  Filled 2021-04-17: qty 250

## 2021-04-17 MED ORDER — SODIUM CHLORIDE 0.9 % IV SOLN
10.0000 mg | Freq: Once | INTRAVENOUS | Status: AC
  Administered 2021-04-17: 10 mg via INTRAVENOUS
  Filled 2021-04-17: qty 10

## 2021-04-17 MED ORDER — DEXTROSE 5 % IV SOLN
Freq: Once | INTRAVENOUS | Status: AC
  Filled 2021-04-17: qty 250

## 2021-04-17 MED ORDER — SODIUM CHLORIDE 0.9 % IV SOLN
2400.0000 mg/m2 | INTRAVENOUS | Status: DC
  Administered 2021-04-17: 3600 mg via INTRAVENOUS
  Filled 2021-04-17: qty 72

## 2021-04-17 MED ORDER — PALONOSETRON HCL INJECTION 0.25 MG/5ML
0.2500 mg | Freq: Once | INTRAVENOUS | Status: AC
  Administered 2021-04-17: 0.25 mg via INTRAVENOUS
  Filled 2021-04-17: qty 5

## 2021-04-17 MED ORDER — POTASSIUM CHLORIDE CRYS ER 20 MEQ PO TBCR: 20.0000 meq | EXTENDED_RELEASE_TABLET | Freq: Every day | ORAL | 3 refills | Status: DC

## 2021-04-17 MED ORDER — SODIUM CHLORIDE 0.9% FLUSH
10.0000 mL | INTRAVENOUS | Status: DC | PRN
  Filled 2021-04-17: qty 10

## 2021-04-17 MED ORDER — SODIUM CHLORIDE 0.9 % IV SOLN
150.0000 mg/m2 | Freq: Once | INTRAVENOUS | Status: AC
  Administered 2021-04-17: 220 mg via INTRAVENOUS
  Filled 2021-04-17: qty 10

## 2021-04-17 MED ORDER — PROCHLORPERAZINE MALEATE 10 MG PO TABS
10.0000 mg | ORAL_TABLET | Freq: Once | ORAL | Status: AC
  Administered 2021-04-17: 10 mg via ORAL
  Filled 2021-04-17: qty 1

## 2021-04-17 MED ORDER — OXALIPLATIN CHEMO INJECTION 100 MG/20ML
65.0000 mg/m2 | Freq: Once | INTRAVENOUS | Status: AC
  Administered 2021-04-17: 100 mg via INTRAVENOUS
  Filled 2021-04-17: qty 20

## 2021-04-17 MED ORDER — POTASSIUM CHLORIDE IN NACL 20-0.9 MEQ/L-% IV SOLN
Freq: Once | INTRAVENOUS | Status: AC
  Filled 2021-04-17: qty 1000

## 2021-04-17 MED ORDER — SODIUM CHLORIDE 0.9 % IV SOLN
2400.0000 mg/m2 | INTRAVENOUS | Status: DC
  Filled 2021-04-17: qty 72

## 2021-04-17 MED ORDER — SODIUM CHLORIDE 0.9 % IV SOLN
150.0000 mg | Freq: Once | INTRAVENOUS | Status: AC
  Administered 2021-04-17: 150 mg via INTRAVENOUS
  Filled 2021-04-17: qty 150

## 2021-04-17 MED ORDER — ATROPINE SULFATE 1 MG/ML IV SOLN
0.5000 mg | Freq: Once | INTRAVENOUS | Status: AC | PRN
  Administered 2021-04-17: 0.5 mg via INTRAVENOUS
  Filled 2021-04-17: qty 1

## 2021-04-17 MED ORDER — SODIUM CHLORIDE 0.9 % IV SOLN
600.0000 mg | Freq: Once | INTRAVENOUS | Status: AC
  Administered 2021-04-17: 600 mg via INTRAVENOUS
  Filled 2021-04-17: qty 30

## 2021-04-17 NOTE — Progress Notes (Signed)
pt states at times she still gets the pain under her ribcage on her left side; at times will last awhile. Also, needs a note in order to return to work.  ?

## 2021-04-17 NOTE — Progress Notes (Signed)
Hematology/Oncology Consult note [facility]  Telephone:([phone] [phone]  Patient Care Team: Cletis Athens, MD as PCP - General (Internal Medicine) Grant Fontana, [place] as Referring Physician (Chiropractic Medicine) Christene Lye, MD (General Surgery) Sindy Guadeloupe, MD as Consulting Physician (Hematology and Oncology)   Name of the patient: Sheryl Perez  [id number]  December 01, 1960   Date of visit: 04/17/21  Diagnosis- pancreatic adenocarcinoma stage II apT2 N1 M0    Chief complaint/ Reason for visit-on treatment assessment prior to cycle 11 of modified FOLFIRINOX chemotherapy  Heme/Onc history:  patient is a 61 year old female with a history of irritable bowel disease for which she follows up with [facility].  She has undergone EGD and colonoscopy with them in the past.  More recently patient was complaining of left upper quadrant and epigastric abdominal pain which causes sudden episodes of abdominal spasms and was prescribed as needed tramadol for it.  These episodes of pain are unrelated to food and are dull aching or throbbing.  She has undergone cholecystectomy in the past.  She underwent CT abdomen and pelvis with contrast in the ER on 08/04/2020 Which did not reveal any acute pathology which showed a possible hypoechoic 1.9 cm right hepatic lobe lesion.  This was followed by an MRI which showed no discrete lesion in the liver and the area of concern noted on CT scan was more likely to represent variable fat deposition.  However there was loss of normal T1 signal in the peripheral pancreas and a potential small pancreatic lesion suspicious for early pancreatic adenocarcinoma in the neck of the pancreas.     Patient underwent EUS by Dr. Mont Dutton which showed an irregular mass in the pancreatic neck measuring 15 x 11 mm.  FNA showed adenocarcinoma.  Pancreatic duct measured 1.1 mm in the head with abrupt dilatation in the region of the mass to 4.1  mm.  No abnormality noted in the common bile duct and hepatic duct.  Celiac region showed no significant endosonographic abnormality.  No lymphadenopathy was seen.  CA 19-9 mildly elevated at 35   Patient was seen by Dr. Hyman Hopes from pancreaticobiliary surgery at [facility] for surgical opinion.  Although patient has a small lesion involving the pancreatic neck, there was a concern for tumor extension towards the splenic vein and narrowing of portal splenic confluence.  Patient received 3 months of neoadjuvant modified FOLFIRINOX chemotherapyAnd then underwent definitive Whipple surgery at [facility] with Dr. Hyman Hopes on 01/13/2021.  Final pathology showed 2.3 cm grade 2 moderately differentiated adenocarcinoma with lymphovascular and perineural invasion.  Treatment effect present with residual cancer showing evident tumor regression but more than single cells or rare small group of cancer cells] partial response.  Margins negative.  1 out of 22 lymph nodes positive for malignancy.    Interval history-lower part of her midline abdominal wound has still not healed well.She still reports fatigue.  Appetite is fair to poor.  She has lost about 3 pounds in the last 1 month  ECOG PS- 1 Pain scale- 0 Opioid associated constipation- no  Review of systems- Review of Systems  Constitutional:  Positive for malaise/fatigue and weight loss. Negative for chills and fever.  HENT:  Negative for congestion, ear discharge and nosebleeds.   Eyes:  Negative for blurred vision.  Respiratory:  Negative for cough, hemoptysis, sputum production, shortness of breath and wheezing.   Cardiovascular:  Negative for chest pain, palpitations, orthopnea and claudication.  Gastrointestinal:  Negative for abdominal  pain, blood in stool, constipation, diarrhea, heartburn, melena, nausea and vomiting.  Genitourinary:  Negative for dysuria, flank pain, frequency, hematuria and urgency.  Musculoskeletal:  Negative for back pain, joint pain and  myalgias.  Skin:  Negative for rash.  Neurological:  Negative for dizziness, tingling, focal weakness, seizures, weakness and headaches.  Endo/Heme/Allergies:  Does not bruise/bleed easily.  Psychiatric/Behavioral:  Negative for depression and suicidal ideas. The patient does not have insomnia.       Allergies  Allergen Reactions   Sulfacetamide Sodium Swelling    Diarrhea,dizziness, tongue swelling   Other Other (See Comments) and Swelling    PT states that anything with the sides effects "may cause rash" she cannot take due to her psoriasis, it will make the patches on her hand so thick she can't use her hands.  Diarrhea,dizziness, tongue swelling   Oxycontin [Oxycodone Hcl] Nausea And Vomiting   Sulfa Antibiotics Swelling    Diarrhea,dizziness, tongue swelling   Abilify [Aripiprazole] Other (See Comments)   Cephalosporins Rash   Erythromycin Other (See Comments)    Abd. pain Abd. pain Other reaction(s): Other (See Comments) Abd. 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    Bipolar disorder (HCC)    Chest pain, unspecified    Chronic low back pain 10/23/2013   Chronic UTI    COPD (chronic obstructive pulmonary disease) (HCC)    Depression    Dysfunction of eustachian tube    Enlargement of lymph nodes    Family history of brain cancer    Family history of breast cancer    Family history of prostate cancer    History of Bell's palsy Right   Injury, other and unspecified, knee, leg, ankle, and foot    MRSA (methicillin resistant staph aureus) culture positive 07/11/2014   abscess   Other specified disease of hair and hair follicles    Pancreatic cancer ([place])    Pancreatic cancer ([place])    Rectal prolapse    Unspecified symptom associated with female  genital organs      Past Surgical History:  Procedure Laterality Date   ABDOMINAL HYSTERECTOMY     bladder tack  2010   CHOLECYSTECTOMY  2002   FOOT SURGERY Bilateral 2004   bunion   GANGLION CYST EXCISION  2002   INCISION AND DRAINAGE ABSCESS  04-16-15   PARTIAL HYSTERECTOMY  2010   uterus removed   PORTA CATH INSERTION N/A 09/11/2020   Procedure: PORTA CATH INSERTION;  Surgeon: Algernon Huxley, MD;  Location: [facility];  Service: Cardiovascular;  Laterality: N/A;    Social History   Socioeconomic History   Marital status: Married    Spouse name: Not on file   Number of children: 3   Years of education: hs   Highest education level: Some college, no degree  Occupational History   Not on file  Tobacco Use   Smoking status: Former    Packs/day: 0.50    Years: 40.00    Pack years: 20.00    Types: Cigarettes    Quit date: 07/14/2020    Years since quitting: 0.7   Smokeless tobacco: Never  Vaping Use   Vaping Use: Never used  Substance and Sexual Activity  Alcohol use: No    Alcohol/week: 1.0 standard drink    Types: 1 Glasses of wine per week    Comment: occasional   Drug use: Yes    Types: Marijuana    Comment: last used 05-20-15   Sexual activity: Yes    Birth control/protection: None  Other Topics Concern   Not on file  Social History Narrative   Patient lives at home with her husband [name]).   Patient works full time.   Education CNA    Right handed.   Caffeine Tea , Soda.   Social Determinants of Health   Financial Resource Strain: Not on file  Food Insecurity: Not on file  Transportation Needs: Not on file  Physical Activity: Not on file  Stress: Not on file  Social Connections: Not on file  Intimate Partner Violence: Not on file    Family History  Problem Relation Age of Onset   Diabetes Father    Cancer Father        brain tumor   Depression Father    Depression Sister    Alcohol abuse Brother    Depression Brother    Leukemia  Maternal Aunt    Prostate cancer Maternal Uncle    Breast cancer Paternal Aunt        dx >50   Heart disease Maternal Grandmother    Heart attack Maternal Grandmother    Stroke Maternal Grandmother    Parkinson's disease Maternal Grandfather    Leukemia Paternal Grandmother    Diabetes Paternal Grandfather    Breast cancer Cousin    Brain cancer Niece        dx under 50     Current Outpatient Medications:    ALPRAZolam (XANAX) 0.5 MG tablet, Take 1 tablet (0.5 mg total) by mouth 2 (two) times daily as needed for anxiety., Disp: 20 tablet, Rfl: 1   Ascorbic Acid (VITAMIN C) 1000 MG tablet, Take 1,000 mg by mouth daily., Disp: , Rfl:    dexamethasone (DECADRON) 4 MG tablet, Take 2 tablets (8 mg total) by mouth daily. Start the day after chemotherapy for 3 days. Take with food., Disp: 8 tablet, Rfl: 5   [START ON 04/20/2021] divalproex (DEPAKOTE) 250 MG DR tablet, Take 1 tablet (250 mg total) by mouth 2 (two) times daily., Disp: 180 tablet, Rfl: 1   famotidine (PEPCID) 20 MG tablet, Take 20 mg by mouth daily., Disp: , Rfl:    famotidine (PEPCID) 20 MG tablet, Take 1 tablet by mouth 2 (two) times daily., Disp: , Rfl:    ferrous sulfate 325 (65 FE) MG tablet, Take 325 mg by mouth 2 (two) times daily with a meal., Disp: , Rfl:    fluticasone (FLONASE) 50 MCG/ACT nasal spray, Place 1 spray into both nostrils daily., Disp: 11.1 mL, Rfl: 2   loperamide (IMODIUM A-D) 2 MG tablet, Take 2 at onset of diarrhea, then 1 every 2hrs until 12hr without a BM. May take 2 tab every 4hrs at bedtime. If diarrhea recurs repeat., Disp: 100 tablet, Rfl: 1   loratadine (CLARITIN) 10 MG tablet, TAKE 1 TABLET BY MOUTH EVERY DAY, Disp: 90 tablet, Rfl: 1   Multiple Vitamin (MULTIVITAMIN) tablet, Take 1 tablet by mouth daily., Disp: , Rfl:    naproxen sodium (ALEVE) 220 MG tablet, Take 220 mg by mouth daily as needed., Disp: , Rfl:    ondansetron (ZOFRAN) 8 MG tablet, Take 1 tablet (8 mg total) by mouth 2 (two) times  daily as needed.  Start on day 3 after chemotherapy., Disp: 30 tablet, Rfl: 1   ondansetron (ZOFRAN-ODT) 4 MG disintegrating tablet, Take 4 mg by mouth every 8 (eight) hours as needed., Disp: , Rfl:    pantoprazole (PROTONIX) 40 MG tablet, Take by mouth., Disp: , Rfl:    potassium chloride SA (KLOR-CON M) 20 MEQ tablet, Take 1 tablet (20 mEq total) by mouth daily., Disp: 30 tablet, Rfl: 3   [START ON 04/20/2021] propranolol (INDERAL) 20 MG tablet, Take 1 tablet (20 mg total) by mouth 3 (three) times daily., Disp: 270 tablet, Rfl: 1   enoxaparin (LOVENOX) 30 MG/0.3ML injection, SMARTSIG:0.3 Milliliter(s) SUB-Q Daily (Patient not taking: Reported on 03/12/2021), Disp: , Rfl:    HYDROmorphone (DILAUDID) 2 MG tablet, Take by mouth. (Patient not taking: Reported on 03/12/2021), Disp: , Rfl:    OLANZapine (ZYPREXA) 10 MG tablet, TAKE 1 TABLET BY MOUTH EVERYDAY AT BEDTIME (Patient not taking: Reported on 03/12/2021), Disp: 90 tablet, Rfl: 1   prochlorperazine (COMPAZINE) 10 MG tablet, Take 1 tablet (10 mg total) by mouth every 6 (six) hours as needed (Nausea or vomiting). (Patient not taking: Reported on 04/17/2021), Disp: 30 tablet, Rfl: 1 No current facility-administered medications for this visit.  Facility-Administered Medications Ordered in Other Visits:    atropine injection 0.5 mg, 0.5 mg, Intravenous, Once PRN, Sindy Guadeloupe, MD   fluorouracil (ADRUCIL) 3,600 mg in sodium chloride 0.9 % 78 mL chemo infusion, 2,400 mg/m2 (Treatment Plan Recorded), Intravenous, 1 day or 1 dose, Sindy Guadeloupe, MD   heparin lock flush 100 UNIT/ML injection, , , ,    irinotecan (CAMPTOSAR) 220 mg in sodium chloride 0.9 % 500 mL chemo infusion, 150 mg/m2 (Treatment Plan Recorded), Intravenous, Once, Sindy Guadeloupe, MD   leucovorin 600 mg in sodium chloride 0.9 % 250 mL infusion, 600 mg, Intravenous, Once, Sindy Guadeloupe, MD   oxaliplatin (ELOXATIN) 100 mg in dextrose 5 % 500 mL chemo infusion, 65 mg/m2 (Treatment Plan  Recorded), Intravenous, Once, Sindy Guadeloupe, MD, Last Rate: 260 mL/hr at 04/17/21 1101, 100 mg at 04/17/21 1101   sodium chloride flush (NS) 0.9 % injection 10 mL, 10 mL, Intracatheter, PRN, Sindy Guadeloupe, MD, 10 mL at 11/03/20 1441   sodium chloride flush (NS) 0.9 % injection 10 mL, 10 mL, Intracatheter, PRN, Sindy Guadeloupe, MD  Physical exam:  Vitals:   04/17/21 0841  BP: (!) 104/58  Pulse: 73  Resp: 18  Temp: 97.6 F (36.4 C)  SpO2: 100%  Weight: 108 lb 6.4 oz (49.2 kg)   Physical Exam Constitutional:      General: She is not in acute distress. Cardiovascular:     Rate and Rhythm: Normal rate and regular rhythm.     Heart sounds: Normal heart sounds.  Pulmonary:     Effort: Pulmonary effort is normal.     Breath sounds: Normal breath sounds.  Abdominal:     General: Bowel sounds are normal.     Palpations: Abdomen is soft.     Comments: Dressing in place over midline abdominal wound.  There is a small open area in the lowermost part roughly 1 cm with clear discharge  Skin:    General: Skin is warm and dry.  Neurological:     Mental Status: She is alert and oriented to person, place, and time.     CMP Latest Ref Rng & Units 04/17/2021  Glucose 70 - 99 mg/dL 120(H)  BUN 6 - 20 mg/dL 8  Creatinine  0.44 - 1.00 mg/dL 0.82  Sodium 135 - 145 mmol/L 136  Potassium 3.5 - 5.1 mmol/L 3.1(L)  Chloride 98 - 111 mmol/L 105  CO2 22 - 32 mmol/L 23  Calcium 8.9 - 10.3 mg/dL 8.5(L)  Total Protein 6.5 - 8.1 g/dL 6.7  Total Bilirubin 0.3 - 1.2 mg/dL 0.4  Alkaline Phos 38 - 126 U/L 188(H)  AST 15 - 41 U/L 49(H)  ALT 0 - 44 U/L 36   CBC Latest Ref Rng & Units 04/17/2021  WBC 4.0 - 10.5 K/uL 11.4(H)  Hemoglobin 12.0 - 15.0 g/dL 11.1(L)  Hematocrit 36.0 - 46.0 % 32.9(L)  Platelets 150 - 400 K/uL 146(L)     Assessment and plan- Patient is a 61 y.o. female with pancreatic adenocarcinoma s/p 3 months of neoadjuvant modified FOLFIRINOX chemotherapy s/p Whipple's.  Final pathology  showed T2N1 disease.  She is here for on treatment assessment prior to cycle 11 of adjuvant modified FOLFIRINOX chemotherapy  Counts okay to proceed with cycle 11 of modified FOLFIRINOX chemotherapy today with pump disconnect on day 3 and gets Udenyca.  I will see her back in 2 weeks for cycle 12 which would be her last chemotherapy.  Patient still has ongoing issues with abdominal wound healing s/p Whipple's which will likely improve after stopping chemotherapy.  Hypokalemia: We will renew oral potassium prescription and we will give her extra IV fluids with 20 mEq of IV potassium.  We will plan to get repeat CT chest abdomen and pelvis with contrast about 3 to 4 weeks from now.  She also follows up with Dr. Hyman Hopes from [facility] pancreaticobiliary.   Visit Diagnosis 1. Hypokalemia   2. Encounter for antineoplastic chemotherapy   3. Chemotherapy-induced fatigue   4. Pancreatic adenocarcinoma [facility])      Dr. Randa Evens, MD, MPH [facility] at [facility] [phone] 04/17/2021 12:31 PM

## 2021-04-17 NOTE — Progress Notes (Signed)
Nutrition Follow-up: ? ?Patient with stage I pancreatic cancer.  Patient completed neoadjuvant chemotherapy, s/p whipple on 12/6 at [facility].  Patient on adjuvant treatment of modified folfirinox.   ? ?Met with patient during infusion.  Patient reports that her appetite is decreased while on treatment.  "I try to eat something."  "Sometimes I can just eat a mayonnaise sandwich." Drinking chocolate milk, chicken noodle soup, vegetable soups with meat, eggs with bacon/sausage.  Got sick after drinking boost shake so not drinking them anymore. Patient having diarrhea.  ? ?Medications: imodium, pepcid, zofran,  ? ?Labs: glucose 120 ? ?Anthropometrics:  ? ?Weight 108 lb 3/10 ? ?113 lb on 2/10 ?112 lb on 1/20 ?115 lb on 11/4 ?113 lb on 10/7 ?111 lb on 9/6 ?113 on 8/19 ? ? ?NUTRITION DIAGNOSIS: Unintentional weight loss improving ? ? ?INTERVENTION:  ?Continue chocolate milk with meals (avoids cold items after treatment) ?Continue antidiarrheal medication ?Encouraged protein foods ?  ? ?MONITORING, EVALUATION, GOAL: weight trends, intake ? ? ?NEXT VISIT: Friday, March 24th during infusion ? ?Drena Ham B. Zenia Resides, RD, LDN ?Registered Dietitian ?[phone] (mobile) ? ? ?

## 2021-04-17 NOTE — Telephone Encounter (Signed)
Spoke with Ms. Sheryl Perez about setting up yearly Lung CA CT Scan. She requested a call back in about 1 month after chemo is finished. ?

## 2021-04-17 NOTE — Patient Instructions (Signed)
[facility] [place]  Discharge Instructions: Thank you for choosing [facility] to provide your oncology and hematology care.  If you have a lab appointment with the [facility], please go directly to the [facility] and check in at the registration area.  Wear comfortable clothing and clothing appropriate for easy access to any Portacath or PICC line.   We strive to give you quality time with your provider. You may need to reschedule your appointment if you arrive late (15 or more minutes).  Arriving late affects you and other patients whose appointments are after yours.  Also, if you miss three or more appointments without notifying the office, you may be dismissed from the clinic at the providers discretion.      For prescription refill requests, have your pharmacy contact our office and allow 72 hours for refills to be completed.    Today you received the following chemotherapy and/or immunotherapy agents - oxaliplatin, irinotecan, fluorouracil      To help prevent nausea and vomiting after your treatment, we encourage you to take your nausea medication as directed.  BELOW ARE SYMPTOMS THAT SHOULD BE REPORTED IMMEDIATELY: *FEVER GREATER THAN 100.4 F (38 C) OR HIGHER *CHILLS OR SWEATING *NAUSEA AND VOMITING THAT IS NOT CONTROLLED WITH YOUR NAUSEA MEDICATION *UNUSUAL SHORTNESS OF BREATH *UNUSUAL BRUISING OR BLEEDING *URINARY PROBLEMS (pain or burning when urinating, or frequent urination) *BOWEL PROBLEMS (unusual diarrhea, constipation, pain near the anus) TENDERNESS IN MOUTH AND THROAT WITH OR WITHOUT PRESENCE OF ULCERS (sore throat, sores in mouth, or a toothache) UNUSUAL RASH, SWELLING OR PAIN  UNUSUAL VAGINAL DISCHARGE OR ITCHING   Items with * indicate a potential emergency and should be followed up as soon as possible or go to the Emergency Department if any problems should occur.  Please show the CHEMOTHERAPY ALERT CARD or  IMMUNOTHERAPY ALERT CARD at check-in to the Emergency Department and triage nurse.  Should you have questions after your visit or need to cancel or reschedule your appointment, please contact [facility] [place] [facility] [place]  [phone] and follow the prompts.  Office hours are 8:00 a.m. to 4:30 p.m. Monday - Friday. Please note that voicemails left after 4:00 p.m. may not be returned until the following business day.  We are closed weekends and major holidays. You have access to a nurse at all times for urgent questions. Please call the main number to the clinic [phone] and follow the prompts.  For any non-urgent questions, you may also contact your provider using MyChart. We now offer e-Visits for anyone 70 and older to request care online for non-urgent symptoms. For details visit [email or web address].   Also download the MyChart app! Go to the app store, search "MyChart", open the app, select [facility], and log in with your MyChart username and password.  Due to Covid, a mask is required upon entering the hospital/clinic. If you do not have a mask, one will be given to you upon arrival. For doctor visits, patients may have 1 support person aged 23 or older with them. For treatment visits, patients cannot have anyone with them due to current Covid guidelines and our immunocompromised population.   Oxaliplatin Injection What is this medication? OXALIPLATIN (ox AL i PLA tin) is a chemotherapy drug. It targets fast dividing cells, like cancer cells, and causes these cells to die. This medicine is used to treat cancers of the colon and rectum, and many other cancers. This medicine may be used for  other purposes; ask your health care provider or pharmacist if you have questions. COMMON BRAND NAME(S): Eloxatin What should I tell my care team before I take this medication? They need to know if you have any of these conditions: heart disease history of irregular heartbeat liver  disease low blood counts, like white cells, platelets, or red blood cells lung or breathing disease, like asthma take medicines that treat or prevent blood clots tingling of the fingers or toes, or other nerve disorder an unusual or allergic reaction to oxaliplatin, other chemotherapy, other medicines, foods, dyes, or preservatives pregnant or trying to get pregnant breast-feeding How should I use this medication? This drug is given as an infusion into a vein. It is administered in a hospital or clinic by a specially trained health care professional. Talk to your pediatrician regarding the use of this medicine in children. Special care may be needed. Overdosage: If you think you have taken too much of this medicine contact a poison control center or emergency room at once. NOTE: This medicine is only for you. Do not share this medicine with others. What if I miss a dose? It is important not to miss a dose. Call your doctor or health care professional if you are unable to keep an appointment. What may interact with this medication? Do not take this medicine with any of the following medications: cisapride dronedarone pimozide thioridazine This medicine may also interact with the following medications: aspirin and aspirin-like medicines certain medicines that treat or prevent blood clots like warfarin, apixaban, dabigatran, and rivaroxaban cisplatin cyclosporine diuretics medicines for infection like acyclovir, adefovir, amphotericin B, bacitracin, cidofovir, foscarnet, ganciclovir, gentamicin, pentamidine, vancomycin NSAIDs, medicines for pain and inflammation, like ibuprofen or naproxen other medicines that prolong the QT interval (an abnormal heart rhythm) pamidronate zoledronic acid This list may not describe all possible interactions. Give your health care provider a list of all the medicines, herbs, non-prescription drugs, or dietary supplements you use. Also tell them if you  smoke, drink alcohol, or use illegal drugs. Some items may interact with your medicine. What should I watch for while using this medication? Your condition will be monitored carefully while you are receiving this medicine. You may need blood work done while you are taking this medicine. This medicine may make you feel generally unwell. This is not uncommon as chemotherapy can affect healthy cells as well as cancer cells. Report any side effects. Continue your course of treatment even though you feel ill unless your healthcare professional tells you to stop. This medicine can make you more sensitive to cold. Do not drink cold drinks or use ice. Cover exposed skin before coming in contact with cold temperatures or cold objects. When out in cold weather wear warm clothing and cover your mouth and nose to warm the air that goes into your lungs. Tell your doctor if you get sensitive to the cold. Do not become pregnant while taking this medicine or for 9 months after stopping it. Women should inform their health care professional if they wish to become pregnant or think they might be pregnant. Men should not father a child while taking this medicine and for 6 months after stopping it. There is potential for serious side effects to an unborn child. Talk to your health care professional for more information. Do not breast-feed a child while taking this medicine or for 3 months after stopping it. This medicine has caused ovarian failure in some women. This medicine may make it more difficult  to get pregnant. Talk to your health care professional if you are concerned about your fertility. This medicine has caused decreased sperm counts in some men. This may make it more difficult to father a child. Talk to your health care professional if you are concerned about your fertility. This medicine may increase your risk of getting an infection. Call your health care professional for advice if you get a fever, chills, or  sore throat, or other symptoms of a cold or flu. Do not treat yourself. Try to avoid being around people who are sick. Avoid taking medicines that contain aspirin, acetaminophen, ibuprofen, naproxen, or ketoprofen unless instructed by your health care professional. These medicines may hide a fever. Be careful brushing or flossing your teeth or using a toothpick because you may get an infection or bleed more easily. If you have any dental work done, tell your dentist you are receiving this medicine. What side effects may I notice from receiving this medication? Side effects that you should report to your doctor or health care professional as soon as possible: allergic reactions like skin rash, itching or hives, swelling of the face, lips, or tongue breathing problems cough low blood counts - this medicine may decrease the number of white blood cells, red blood cells, and platelets. You may be at increased risk for infections and bleeding nausea, vomiting pain, redness, or irritation at site where injected pain, tingling, numbness in the hands or feet signs and symptoms of bleeding such as bloody or black, tarry stools; red or dark brown urine; spitting up blood or brown material that looks like coffee grounds; red spots on the skin; unusual bruising or bleeding from the eyes, gums, or nose signs and symptoms of a dangerous change in heartbeat or heart rhythm like chest pain; dizziness; fast, irregular heartbeat; palpitations; feeling faint or lightheaded; falls signs and symptoms of infection like fever; chills; cough; sore throat; pain or trouble passing urine signs and symptoms of liver injury like dark yellow or brown urine; general ill feeling or flu-like symptoms; light-colored stools; loss of appetite; nausea; right upper belly pain; unusually weak or tired; yellowing of the eyes or skin signs and symptoms of low red blood cells or anemia such as unusually weak or tired; feeling faint or  lightheaded; falls signs and symptoms of muscle injury like dark urine; trouble passing urine or change in the amount of urine; unusually weak or tired; muscle pain; back pain Side effects that usually do not require medical attention (report to your doctor or health care professional if they continue or are bothersome): changes in taste diarrhea gas hair loss loss of appetite mouth sores This list may not describe all possible side effects. Call your doctor for medical advice about side effects. You may report side effects to FDA at 1-800-FDA-1088. Where should I keep my medication? This drug is given in a hospital or clinic and will not be stored at home. NOTE: This sheet is a summary. It may not cover all possible information. If you have questions about this medicine, talk to your doctor, pharmacist, or health care provider.  2022 Elsevier/Gold Standard (2020-10-14 00:00:00)  Irinotecan injection What is this medication? IRINOTECAN (ir in oh TEE kan ) is a chemotherapy drug. It is used to treat colon and rectal cancer. This medicine may be used for other purposes; ask your health care provider or pharmacist if you have questions. COMMON BRAND NAME(S): Camptosar What should I tell my care team before I take this  medication? They need to know if you have any of these conditions: dehydration diarrhea infection (especially a virus infection such as chickenpox, cold sores, or herpes) liver disease low blood counts, like low white cell, platelet, or red cell counts low levels of calcium, magnesium, or potassium in the blood recent or ongoing radiation therapy an unusual or allergic reaction to irinotecan, other medicines, foods, dyes, or preservatives pregnant or trying to get pregnant breast-feeding How should I use this medication? This drug is given as an infusion into a vein. It is administered in a hospital or clinic by a specially trained health care professional. Talk to your  pediatrician regarding the use of this medicine in children. Special care may be needed. Overdosage: If you think you have taken too much of this medicine contact a poison control center or emergency room at once. NOTE: This medicine is only for you. Do not share this medicine with others. What if I miss a dose? It is important not to miss your dose. Call your doctor or health care professional if you are unable to keep an appointment. What may interact with this medication? Do not take this medicine with any of the following medications: cobicistat itraconazole This medicine may interact with the following medications: antiviral medicines for HIV or AIDS certain antibiotics like rifampin or rifabutin certain medicines for fungal infections like ketoconazole, posaconazole, and voriconazole certain medicines for seizures like carbamazepine, phenobarbital, phenotoin clarithromycin gemfibrozil nefazodone [facility]. John's Wort This list may not describe all possible interactions. Give your health care provider a list of all the medicines, herbs, non-prescription drugs, or dietary supplements you use. Also tell them if you smoke, drink alcohol, or use illegal drugs. Some items may interact with your medicine. What should I watch for while using this medication? Your condition will be monitored carefully while you are receiving this medicine. You will need important blood work done while you are taking this medicine. This drug may make you feel generally unwell. This is not uncommon, as chemotherapy can affect healthy cells as well as cancer cells. Report any side effects. Continue your course of treatment even though you feel ill unless your doctor tells you to stop. In some cases, you may be given additional medicines to help with side effects. Follow all directions for their use. You may get drowsy or dizzy. Do not drive, use machinery, or do anything that needs mental alertness until you know how this  medicine affects you. Do not stand or sit up quickly, especially if you are an older patient. This reduces the risk of dizzy or fainting spells. Call your health care professional for advice if you get a fever, chills, or sore throat, or other symptoms of a cold or flu. Do not treat yourself. This medicine decreases your body's ability to fight infections. Try to avoid being around people who are sick. Avoid taking products that contain aspirin, acetaminophen, ibuprofen, naproxen, or ketoprofen unless instructed by your doctor. These medicines may hide a fever. This medicine may increase your risk to bruise or bleed. Call your doctor or health care professional if you notice any unusual bleeding. Be careful brushing and flossing your teeth or using a toothpick because you may get an infection or bleed more easily. If you have any dental work done, tell your dentist you are receiving this medicine. Do not become pregnant while taking this medicine or for 6 months after stopping it. Women should inform their health care professional if they wish to become  pregnant or think they might be pregnant. Men should not father a child while taking this medicine and for 3 months after stopping it. There is potential for serious side effects to an unborn child. Talk to your health care professional for more information. Do not breast-feed an infant while taking this medicine or for 7 days after stopping it. This medicine has caused ovarian failure in some women. This medicine may make it more difficult to get pregnant. Talk to your health care professional if you are concerned about your fertility. This medicine has caused decreased sperm counts in some men. This may make it more difficult to father a child. Talk to your health care professional if you are concerned about your fertility. What side effects may I notice from receiving this medication? Side effects that you should report to your doctor or health care  professional as soon as possible: allergic reactions like skin rash, itching or hives, swelling of the face, lips, or tongue chest pain diarrhea flushing, runny nose, sweating during infusion low blood counts - this medicine may decrease the number of white blood cells, red blood cells and platelets. You may be at increased risk for infections and bleeding. nausea, vomiting pain, swelling, warmth in the leg signs of decreased platelets or bleeding - bruising, pinpoint red spots on the skin, black, tarry stools, blood in the urine signs of infection - fever or chills, cough, sore throat, pain or difficulty passing urine signs of decreased red blood cells - unusually weak or tired, fainting spells, lightheadedness Side effects that usually do not require medical attention (report to your doctor or health care professional if they continue or are bothersome): constipation hair loss headache loss of appetite mouth sores stomach pain This list may not describe all possible side effects. Call your doctor for medical advice about side effects. You may report side effects to FDA at 1-800-FDA-1088. Where should I keep my medication? This drug is given in a hospital or clinic and will not be stored at home. NOTE: This sheet is a summary. It may not cover all possible information. If you have questions about this medicine, talk to your doctor, pharmacist, or health care provider.  2022 Elsevier/Gold Standard (2020-10-14 00:00:00)  Fluorouracil, 5-FU injection What is this medication? FLUOROURACIL, 5-FU (flure oh YOOR a sil) is a chemotherapy drug. It slows the growth of cancer cells. This medicine is used to treat many types of cancer like breast cancer, colon or rectal cancer, pancreatic cancer, and stomach cancer. This medicine may be used for other purposes; ask your health care provider or pharmacist if you have questions. COMMON BRAND NAME(S): Adrucil What should I tell my care team before I  take this medication? They need to know if you have any of these conditions: blood disorders dihydropyrimidine dehydrogenase (DPD) deficiency infection (especially a virus infection such as chickenpox, cold sores, or herpes) kidney disease liver disease malnourished, poor nutrition recent or ongoing radiation therapy an unusual or allergic reaction to fluorouracil, other chemotherapy, other medicines, foods, dyes, or preservatives pregnant or trying to get pregnant breast-feeding How should I use this medication? This drug is given as an infusion or injection into a vein. It is administered in a hospital or clinic by a specially trained health care professional. Talk to your pediatrician regarding the use of this medicine in children. Special care may be needed. Overdosage: If you think you have taken too much of this medicine contact a poison control center or emergency room at  once. NOTE: This medicine is only for you. Do not share this medicine with others. What if I miss a dose? It is important not to miss your dose. Call your doctor or health care professional if you are unable to keep an appointment. What may interact with this medication? Do not take this medicine with any of the following medications: live virus vaccines This medicine may also interact with the following medications: medicines that treat or prevent blood clots like warfarin, enoxaparin, and dalteparin This list may not describe all possible interactions. Give your health care provider a list of all the medicines, herbs, non-prescription drugs, or dietary supplements you use. Also tell them if you smoke, drink alcohol, or use illegal drugs. Some items may interact with your medicine. What should I watch for while using this medication? Visit your doctor for checks on your progress. This drug may make you feel generally unwell. This is not uncommon, as chemotherapy can affect healthy cells as well as cancer cells.  Report any side effects. Continue your course of treatment even though you feel ill unless your doctor tells you to stop. In some cases, you may be given additional medicines to help with side effects. Follow all directions for their use. Call your doctor or health care professional for advice if you get a fever, chills or sore throat, or other symptoms of a cold or flu. Do not treat yourself. This drug decreases your body's ability to fight infections. Try to avoid being around people who are sick. This medicine may increase your risk to bruise or bleed. Call your doctor or health care professional if you notice any unusual bleeding. Be careful brushing and flossing your teeth or using a toothpick because you may get an infection or bleed more easily. If you have any dental work done, tell your dentist you are receiving this medicine. Avoid taking products that contain aspirin, acetaminophen, ibuprofen, naproxen, or ketoprofen unless instructed by your doctor. These medicines may hide a fever. Do not become pregnant while taking this medicine. Women should inform their doctor if they wish to become pregnant or think they might be pregnant. There is a potential for serious side effects to an unborn child. Talk to your health care professional or pharmacist for more information. Do not breast-feed an infant while taking this medicine. Men should inform their doctor if they wish to father a child. This medicine may lower sperm counts. Do not treat diarrhea with over the counter products. Contact your doctor if you have diarrhea that lasts more than 2 days or if it is severe and watery. This medicine can make you more sensitive to the sun. Keep out of the sun. If you cannot avoid being in the sun, wear protective clothing and use sunscreen. Do not use sun lamps or tanning beds/booths. What side effects may I notice from receiving this medication? Side effects that you should report to your doctor or health  care professional as soon as possible: allergic reactions like skin rash, itching or hives, swelling of the face, lips, or tongue low blood counts - this medicine may decrease the number of white blood cells, red blood cells and platelets. You may be at increased risk for infections and bleeding. signs of infection - fever or chills, cough, sore throat, pain or difficulty passing urine signs of decreased platelets or bleeding - bruising, pinpoint red spots on the skin, black, tarry stools, blood in the urine signs of decreased red blood cells - unusually weak  or tired, fainting spells, lightheadedness breathing problems changes in vision chest pain mouth sores nausea and vomiting pain, swelling, redness at site where injected pain, tingling, numbness in the hands or feet redness, swelling, or sores on hands or feet stomach pain unusual bleeding Side effects that usually do not require medical attention (report to your doctor or health care professional if they continue or are bothersome): changes in finger or toe nails diarrhea dry or itchy skin hair loss headache loss of appetite sensitivity of eyes to the light stomach upset unusually teary eyes This list may not describe all possible side effects. Call your doctor for medical advice about side effects. You may report side effects to FDA at 1-800-FDA-1088. Where should I keep my medication? This drug is given in a hospital or clinic and will not be stored at home. NOTE: This sheet is a summary. It may not cover all possible information. If you have questions about this medicine, talk to your doctor, pharmacist, or health care provider.  2022 Elsevier/Gold Standard (2020-10-14 00:00:00)

## 2021-04-20 ENCOUNTER — Inpatient Hospital Stay: Payer: BC Managed Care – PPO

## 2021-04-20 ENCOUNTER — Other Ambulatory Visit: Payer: Self-pay

## 2021-04-20 VITALS — BP 114/49 | HR 77 | Temp 98.1°F | Resp 20

## 2021-04-20 DIAGNOSIS — R1012 Left upper quadrant pain: Secondary | ICD-10-CM | POA: Diagnosis not present

## 2021-04-20 DIAGNOSIS — R197 Diarrhea, unspecified: Secondary | ICD-10-CM | POA: Diagnosis not present

## 2021-04-20 DIAGNOSIS — K589 Irritable bowel syndrome without diarrhea: Secondary | ICD-10-CM | POA: Diagnosis not present

## 2021-04-20 DIAGNOSIS — R5383 Other fatigue: Secondary | ICD-10-CM | POA: Diagnosis not present

## 2021-04-20 DIAGNOSIS — R110 Nausea: Secondary | ICD-10-CM | POA: Diagnosis not present

## 2021-04-20 DIAGNOSIS — Z7901 Long term (current) use of anticoagulants: Secondary | ICD-10-CM | POA: Diagnosis not present

## 2021-04-20 DIAGNOSIS — F3178 Bipolar disorder, in full remission, most recent episode mixed: Secondary | ICD-10-CM | POA: Diagnosis not present

## 2021-04-20 DIAGNOSIS — R1013 Epigastric pain: Secondary | ICD-10-CM | POA: Diagnosis not present

## 2021-04-20 DIAGNOSIS — Z79899 Other long term (current) drug therapy: Secondary | ICD-10-CM | POA: Diagnosis not present

## 2021-04-20 DIAGNOSIS — C259 Malignant neoplasm of pancreas, unspecified: Secondary | ICD-10-CM

## 2021-04-20 DIAGNOSIS — Z881 Allergy status to other antibiotic agents status: Secondary | ICD-10-CM | POA: Diagnosis not present

## 2021-04-20 DIAGNOSIS — E876 Hypokalemia: Secondary | ICD-10-CM | POA: Diagnosis not present

## 2021-04-20 DIAGNOSIS — C257 Malignant neoplasm of other parts of pancreas: Secondary | ICD-10-CM | POA: Diagnosis not present

## 2021-04-20 DIAGNOSIS — Z87440 Personal history of urinary (tract) infections: Secondary | ICD-10-CM | POA: Diagnosis not present

## 2021-04-20 DIAGNOSIS — Z5189 Encounter for other specified aftercare: Secondary | ICD-10-CM | POA: Diagnosis not present

## 2021-04-20 DIAGNOSIS — Z87891 Personal history of nicotine dependence: Secondary | ICD-10-CM | POA: Diagnosis not present

## 2021-04-20 DIAGNOSIS — Z5111 Encounter for antineoplastic chemotherapy: Secondary | ICD-10-CM | POA: Diagnosis not present

## 2021-04-20 MED ORDER — SODIUM CHLORIDE 0.9% FLUSH
10.0000 mL | INTRAVENOUS | Status: DC | PRN
  Administered 2021-04-20: 10 mL
  Filled 2021-04-20: qty 10

## 2021-04-20 MED ORDER — HEPARIN SOD (PORK) LOCK FLUSH 100 UNIT/ML IV SOLN
500.0000 [IU] | Freq: Once | INTRAVENOUS | Status: AC | PRN
  Administered 2021-04-20: 500 [IU]
  Filled 2021-04-20: qty 5

## 2021-04-20 MED ORDER — PEGFILGRASTIM-CBQV 6 MG/0.6ML ~~LOC~~ SOSY
6.0000 mg | PREFILLED_SYRINGE | Freq: Once | SUBCUTANEOUS | Status: AC
  Administered 2021-04-20: 6 mg via SUBCUTANEOUS
  Filled 2021-04-20: qty 0.6

## 2021-04-20 NOTE — Progress Notes (Signed)
Patient presented to clinic with 5FU pump reading low. Per pump, 3.50m volume left. Patient wishes to have pump removed at this time as she is unable to wait any longer.  ?

## 2021-04-24 DIAGNOSIS — C259 Malignant neoplasm of pancreas, unspecified: Secondary | ICD-10-CM | POA: Diagnosis not present

## 2021-04-30 MED FILL — Dexamethasone Sodium Phosphate Inj 100 MG/10ML: INTRAMUSCULAR | Qty: 1 | Status: AC

## 2021-04-30 MED FILL — Fosaprepitant Dimeglumine For IV Infusion 150 MG (Base Eq): INTRAVENOUS | Qty: 5 | Status: AC

## 2021-05-01 ENCOUNTER — Inpatient Hospital Stay: Payer: BC Managed Care – PPO

## 2021-05-01 ENCOUNTER — Encounter: Payer: Self-pay | Admitting: Oncology

## 2021-05-01 ENCOUNTER — Other Ambulatory Visit: Payer: Self-pay

## 2021-05-01 ENCOUNTER — Inpatient Hospital Stay (HOSPITAL_BASED_OUTPATIENT_CLINIC_OR_DEPARTMENT_OTHER): Payer: BC Managed Care – PPO | Admitting: Oncology

## 2021-05-01 VITALS — BP 122/50 | HR 85 | Temp 96.4°F | Resp 14 | Wt 103.8 lb

## 2021-05-01 DIAGNOSIS — C259 Malignant neoplasm of pancreas, unspecified: Secondary | ICD-10-CM | POA: Diagnosis not present

## 2021-05-01 DIAGNOSIS — Z7901 Long term (current) use of anticoagulants: Secondary | ICD-10-CM | POA: Diagnosis not present

## 2021-05-01 DIAGNOSIS — R1012 Left upper quadrant pain: Secondary | ICD-10-CM | POA: Diagnosis not present

## 2021-05-01 DIAGNOSIS — Z7189 Other specified counseling: Secondary | ICD-10-CM

## 2021-05-01 DIAGNOSIS — Z5189 Encounter for other specified aftercare: Secondary | ICD-10-CM | POA: Diagnosis not present

## 2021-05-01 DIAGNOSIS — R110 Nausea: Secondary | ICD-10-CM | POA: Diagnosis not present

## 2021-05-01 DIAGNOSIS — F3178 Bipolar disorder, in full remission, most recent episode mixed: Secondary | ICD-10-CM | POA: Diagnosis not present

## 2021-05-01 DIAGNOSIS — Z881 Allergy status to other antibiotic agents status: Secondary | ICD-10-CM | POA: Diagnosis not present

## 2021-05-01 DIAGNOSIS — R1013 Epigastric pain: Secondary | ICD-10-CM | POA: Diagnosis not present

## 2021-05-01 DIAGNOSIS — Z87891 Personal history of nicotine dependence: Secondary | ICD-10-CM | POA: Diagnosis not present

## 2021-05-01 DIAGNOSIS — Z87440 Personal history of urinary (tract) infections: Secondary | ICD-10-CM | POA: Diagnosis not present

## 2021-05-01 DIAGNOSIS — Z79899 Other long term (current) drug therapy: Secondary | ICD-10-CM | POA: Diagnosis not present

## 2021-05-01 DIAGNOSIS — E876 Hypokalemia: Secondary | ICD-10-CM | POA: Diagnosis not present

## 2021-05-01 DIAGNOSIS — K589 Irritable bowel syndrome without diarrhea: Secondary | ICD-10-CM | POA: Diagnosis not present

## 2021-05-01 DIAGNOSIS — C257 Malignant neoplasm of other parts of pancreas: Secondary | ICD-10-CM | POA: Diagnosis not present

## 2021-05-01 DIAGNOSIS — Z5111 Encounter for antineoplastic chemotherapy: Secondary | ICD-10-CM | POA: Diagnosis not present

## 2021-05-01 DIAGNOSIS — R197 Diarrhea, unspecified: Secondary | ICD-10-CM | POA: Diagnosis not present

## 2021-05-01 DIAGNOSIS — R5383 Other fatigue: Secondary | ICD-10-CM | POA: Diagnosis not present

## 2021-05-01 LAB — COMPREHENSIVE METABOLIC PANEL
ALT: 33 U/L (ref 0–44)
AST: 40 U/L (ref 15–41)
Albumin: 3.4 g/dL — ABNORMAL LOW (ref 3.5–5.0)
Alkaline Phosphatase: 220 U/L — ABNORMAL HIGH (ref 38–126)
Anion gap: 7 (ref 5–15)
BUN: 9 mg/dL (ref 6–20)
CO2: 23 mmol/L (ref 22–32)
Calcium: 8.9 mg/dL (ref 8.9–10.3)
Chloride: 107 mmol/L (ref 98–111)
Creatinine, Ser: 0.97 mg/dL (ref 0.44–1.00)
GFR, Estimated: >60 mL/min (ref >60)
Glucose, Bld: 114 mg/dL — ABNORMAL HIGH (ref 70–99)
Potassium: 3.6 mmol/L (ref 3.5–5.1)
Sodium: 137 mmol/L (ref 135–145)
Total Bilirubin: 0.2 mg/dL — ABNORMAL LOW (ref 0.3–1.2)
Total Protein: 6.9 g/dL (ref 6.5–8.1)

## 2021-05-01 LAB — CBC WITH DIFFERENTIAL/PLATELET
Abs Immature Granulocytes: 0.54 10*3/uL — ABNORMAL HIGH (ref 0.00–0.07)
Basophils Absolute: 0.1 10*3/uL (ref 0.0–0.1)
Basophils Relative: 1 %
Eosinophils Absolute: 0.1 10*3/uL (ref 0.0–0.5)
Eosinophils Relative: 0 %
HCT: 32.3 % — ABNORMAL LOW (ref 36.0–46.0)
Hemoglobin: 10.7 g/dL — ABNORMAL LOW (ref 12.0–15.0)
Immature Granulocytes: 5 %
Lymphocytes Relative: 13 %
Lymphs Abs: 1.5 10*3/uL (ref 0.7–4.0)
MCH: 33.2 pg (ref 26.0–34.0)
MCHC: 33.1 g/dL (ref 30.0–36.0)
MCV: 100.3 fL — ABNORMAL HIGH (ref 80.0–100.0)
Monocytes Absolute: 1.1 10*3/uL — ABNORMAL HIGH (ref 0.1–1.0)
Monocytes Relative: 9 %
Neutro Abs: 8.3 10*3/uL — ABNORMAL HIGH (ref 1.7–7.7)
Neutrophils Relative %: 72 %
Platelets: 176 10*3/uL (ref 150–400)
RBC: 3.22 MIL/uL — ABNORMAL LOW (ref 3.87–5.11)
RDW: 15.4 % (ref 11.5–15.5)
WBC: 11.6 10*3/uL — ABNORMAL HIGH (ref 4.0–10.5)
nRBC: 0 % (ref 0.0–0.2)

## 2021-05-01 MED ORDER — HEPARIN SOD (PORK) LOCK FLUSH 100 UNIT/ML IV SOLN
500.0000 [IU] | Freq: Once | INTRAVENOUS | Status: AC
  Filled 2021-05-01: qty 5

## 2021-05-01 MED ORDER — SODIUM CHLORIDE 0.9% FLUSH
10.0000 mL | INTRAVENOUS | Status: AC | PRN
  Filled 2021-05-01: qty 10

## 2021-05-01 NOTE — Progress Notes (Signed)
Pt states she has been feeling very nauseous and dealing with diarrhea for the past couple of weeks. Diarrhea has been on and off will take IMODIUM but when medications wears off she has to go again.  ?

## 2021-05-01 NOTE — Progress Notes (Signed)
? ? ? ?Hematology/Oncology Consult note ?[place]  ?Telephone:([phone]) [phone] [phone] ? ?Patient Care Team: ?Cletis Athens, MD as PCP - General (Internal Medicine) ?Grant Fontana, [place] as Referring Physician (Chiropractic Medicine) ?Christene Lye, MD (General Surgery) ?Sindy Guadeloupe, MD as Consulting Physician (Hematology and Oncology)  ? ?Name of the patient: Sheryl Perez  ?[id number]  ?1960/02/21  ? ?Date of visit: 05/01/21 ? ?Diagnosis- pancreatic adenocarcinoma stage II apT2 N1 M0 ? ?Chief complaint/ Reason for visit-on treatment assessment prior to cycle 12 of modified FOLFIRINOX chemotherapy ? ?Heme/Onc history: patient is a 61 year old female with a history of irritable bowel disease for which she follows up with [facility].  She has undergone EGD and colonoscopy with them in the past.  More recently patient was complaining of left upper quadrant and epigastric abdominal pain which causes sudden episodes of abdominal spasms and was prescribed as needed tramadol for it.  These episodes of pain are unrelated to food and are dull aching or throbbing.  She has undergone cholecystectomy in the past.  She underwent CT abdomen and pelvis with contrast in the ER on 08/04/2020 Which did not reveal any acute pathology which showed a possible hypoechoic 1.9 cm right hepatic lobe lesion.  This was followed by an MRI which showed no discrete lesion in the liver and the area of concern noted on CT scan was more likely to represent variable fat deposition.  However there was loss of normal T1 signal in the peripheral pancreas and a potential small pancreatic lesion suspicious for early pancreatic adenocarcinoma in the neck of the pancreas.   ?  ?Patient underwent EUS by Dr. Mont Dutton which showed an irregular mass in the pancreatic neck measuring 15 x 11 mm.  FNA showed adenocarcinoma.  Pancreatic duct measured 1.1 mm in the head with abrupt dilatation in the region of the mass to 4.1 mm.   No abnormality noted in the common bile duct and hepatic duct.  Celiac region showed no significant endosonographic abnormality.  No lymphadenopathy was seen.  CA 19-9 mildly elevated at 35 ?  ?Patient was seen by Dr. Hyman Hopes from pancreaticobiliary surgery at [facility] for surgical opinion.  Although patient has a small lesion involving the pancreatic neck, there was a concern for tumor extension towards the splenic vein and narrowing of portal splenic confluence.  Patient received 3 months of neoadjuvant modified FOLFIRINOX chemotherapyAnd then underwent definitive Whipple surgery at [facility] with Dr. Hyman Hopes on 01/13/2021.  Final pathology showed 2.3 cm grade 2 moderately differentiated adenocarcinoma with lymphovascular and perineural invasion.  Treatment effect present with residual cancer showing evident tumor regression but more than single cells or rare small group of cancer cells] partial response.  Margins negative.  1 out of 22 lymph nodes positive for malignancy. ? ?Interval history-patient reports nausea as well as diarrhea which has been ongoing for the last 2 weeks intermittently.  States that the symptoms are somewhat better but she still not feeling good today to receive chemotherapy.  Lower part of her abdominal wound is still open and draining serous discharge and has not healed yet. ? ?ECOG PS- 1 ?Pain scale- 0 ? ? ?Review of systems- Review of Systems  ?Constitutional:  Positive for malaise/fatigue. Negative for chills, fever and weight loss.  ?HENT:  Negative for congestion, ear discharge and nosebleeds.   ?Eyes:  Negative for blurred vision.  ?Respiratory:  Negative for cough, hemoptysis, sputum production, shortness of breath and wheezing.   ?Cardiovascular:  Negative for  chest pain, palpitations, orthopnea and claudication.  ?Gastrointestinal:  Positive for diarrhea and nausea. Negative for abdominal pain, blood in stool, constipation, heartburn, melena and vomiting.  ?Genitourinary:  Negative for  dysuria, flank pain, frequency, hematuria and urgency.  ?Musculoskeletal:  Negative for back pain, joint pain and myalgias.  ?Skin:  Negative for rash.  ?Neurological:  Negative for dizziness, tingling, focal weakness, seizures, weakness and headaches.  ?Endo/Heme/Allergies:  Does not bruise/bleed easily.  ?Psychiatric/Behavioral:  Negative for depression and suicidal ideas. The patient does not have insomnia.    ? ? ?Allergies  ?Allergen Reactions  ? Sulfacetamide Sodium Swelling  ?  Diarrhea,dizziness, tongue swelling  ? Other Other (See Comments) and Swelling  ?  PT states that anything with the sides effects "may cause rash" she cannot take due to her psoriasis, it will make the patches on her hand so thick she can't use her hands.  ?Diarrhea,dizziness, tongue swelling  ? Oxycontin [Oxycodone Hcl] Nausea And Vomiting  ? Sulfa Antibiotics Swelling  ?  Diarrhea,dizziness, tongue swelling  ? Abilify [Aripiprazole] Other (See Comments)  ? Cephalosporins Rash  ? Erythromycin Other (See Comments)  ?  Abd. pain ?Abd. pain ?Other reaction(s): Other (See Comments) ?Abd. pain  ? Lamotrigine Rash and Other (See Comments)  ?  psorasis worsen ?psorasis worsen  ? Risperidone Other (See Comments)  ?  tremors ?tremors ?tremors  ? ? ? ?Past Medical History:  ?Diagnosis Date  ? Back injury   ? 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 Bipolar disorder (HCC)   ? Chest pain, unspecified   ? Chronic low back pain 10/23/2013  ? Chronic UTI   ? COPD (chronic obstructive pulmonary disease) ([place])   ? Depression   ? Dysfunction of eustachian tube   ? Enlargement of lymph nodes   ? Family history of brain cancer   ? Family history of breast cancer   ? Family history of prostate cancer   ? History of Bell's palsy Right  ? Injury, other and unspecified, knee, leg, ankle, and foot   ? MRSA (methicillin resistant staph aureus) culture positive 07/11/2014  ? abscess  ? Other specified disease of hair and hair follicles   ?  Pancreatic cancer ([place])   ? Pancreatic cancer ([place])   ? Rectal prolapse   ? Unspecified symptom associated with female genital organs   ? ? ? ?Past Surgical History:  ?Procedure Laterality Date  ? ABDOMINAL HYSTERECTOMY    ? bladder tack  2010  ? CHOLECYSTECTOMY  2002  ? FOOT SURGERY Bilateral 2004  ? bunion  ? GANGLION CYST EXCISION  2002  ? INCISION AND DRAINAGE ABSCESS  04-16-15  ? PARTIAL HYSTERECTOMY  2010  ? uterus removed  ? PORTA CATH INSERTION N/A 09/11/2020  ? Procedure: PORTA CATH INSERTION;  Surgeon: Algernon Huxley, MD;  Location: [facility];  Service: Cardiovascular;  Laterality: N/A;  ? ? ?Social History  ? ?Socioeconomic History  ? Marital status: Married  ?  Spouse name: Not on file  ? Number of children: 3  ? Years of education: hs  ? Highest education level: Some college, no degree  ?Occupational History  ? Not on file  ?Tobacco Use  ? Smoking status: Former  ?  Packs/day: 0.50  ?  Years: 40.00  ?  Pack years: 20.00  ?  Types: Cigarettes  ?  Quit date: 07/14/2020  ?  Years since quitting: 0.7  ? Smokeless tobacco: Never  ?  Vaping Use  ? Vaping Use: Never used  ?Substance and Sexual Activity  ? Alcohol use: No  ?  Alcohol/week: 1.0 standard drink  ?  Types: 1 Glasses of wine per week  ?  Comment: occasional  ? Drug use: Yes  ?  Types: Marijuana  ?  Comment: last used 05-20-15  ? Sexual activity: Yes  ?  Birth control/protection: None  ?Other Topics Concern  ? Not on file  ?Social History Narrative  ? Patient lives at home with her husband [name]).  ? 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  ? Caffeine Tea , Soda.  ? ?Social Determinants of Health  ? ?Financial Resource Strain: Not on file  ?Food Insecurity: Not on file  ?Transportation Needs: Not on file  ?Physical Activity: Not on file  ?Stress: Not on file  ?Social Connections: Not on file  ?Intimate Partner Violence: Not on file  ? ? ?Family History  ?Problem Relation Age of Onset  ? Diabetes Father   ? Cancer Father   ?      brain tumor  ? Depression Father   ? Depression Sister   ? Alcohol abuse Brother   ? Depression Brother   ? Leukemia Maternal Aunt   ? Prostate cancer Maternal Uncle   ? Breast cancer Paternal Aunt   ?     d

## 2021-05-01 NOTE — Progress Notes (Signed)
Nutrition ? ?RD planning to see patient in infusion but infusion cancelled for today.  Patient ready to go home.  RD to follow-up as able.   ? ?Soul Hackman B. Zenia Resides, RD, LDN ?Registered Dietitian ?[phone] ? ?

## 2021-05-04 ENCOUNTER — Inpatient Hospital Stay: Payer: BC Managed Care – PPO

## 2021-05-12 ENCOUNTER — Other Ambulatory Visit: Payer: Self-pay

## 2021-06-01 ENCOUNTER — Inpatient Hospital Stay: Payer: BC Managed Care – PPO | Attending: Oncology

## 2021-06-01 ENCOUNTER — Other Ambulatory Visit: Payer: Self-pay | Admitting: *Deleted

## 2021-06-01 ENCOUNTER — Encounter: Payer: Self-pay | Admitting: Oncology

## 2021-06-01 ENCOUNTER — Inpatient Hospital Stay (HOSPITAL_BASED_OUTPATIENT_CLINIC_OR_DEPARTMENT_OTHER): Payer: BC Managed Care – PPO | Admitting: Oncology

## 2021-06-01 ENCOUNTER — Inpatient Hospital Stay: Payer: BC Managed Care – PPO

## 2021-06-01 VITALS — BP 111/43 | HR 69 | Temp 98.7°F | Resp 16 | Ht 60.0 in | Wt 104.0 lb

## 2021-06-01 DIAGNOSIS — Z833 Family history of diabetes mellitus: Secondary | ICD-10-CM | POA: Insufficient documentation

## 2021-06-01 DIAGNOSIS — Z9049 Acquired absence of other specified parts of digestive tract: Secondary | ICD-10-CM | POA: Insufficient documentation

## 2021-06-01 DIAGNOSIS — D649 Anemia, unspecified: Secondary | ICD-10-CM | POA: Diagnosis not present

## 2021-06-01 DIAGNOSIS — Z885 Allergy status to narcotic agent status: Secondary | ICD-10-CM | POA: Insufficient documentation

## 2021-06-01 DIAGNOSIS — Z79899 Other long term (current) drug therapy: Secondary | ICD-10-CM | POA: Insufficient documentation

## 2021-06-01 DIAGNOSIS — Z888 Allergy status to other drugs, medicaments and biological substances status: Secondary | ICD-10-CM | POA: Insufficient documentation

## 2021-06-01 DIAGNOSIS — Z811 Family history of alcohol abuse and dependence: Secondary | ICD-10-CM | POA: Diagnosis not present

## 2021-06-01 DIAGNOSIS — F319 Bipolar disorder, unspecified: Secondary | ICD-10-CM | POA: Diagnosis not present

## 2021-06-01 DIAGNOSIS — Z806 Family history of leukemia: Secondary | ICD-10-CM | POA: Insufficient documentation

## 2021-06-01 DIAGNOSIS — R1012 Left upper quadrant pain: Secondary | ICD-10-CM | POA: Insufficient documentation

## 2021-06-01 DIAGNOSIS — Z87440 Personal history of urinary (tract) infections: Secondary | ICD-10-CM | POA: Diagnosis not present

## 2021-06-01 DIAGNOSIS — R1013 Epigastric pain: Secondary | ICD-10-CM | POA: Insufficient documentation

## 2021-06-01 DIAGNOSIS — Z8249 Family history of ischemic heart disease and other diseases of the circulatory system: Secondary | ICD-10-CM | POA: Insufficient documentation

## 2021-06-01 DIAGNOSIS — F1290 Cannabis use, unspecified, uncomplicated: Secondary | ICD-10-CM | POA: Diagnosis not present

## 2021-06-01 DIAGNOSIS — Z881 Allergy status to other antibiotic agents status: Secondary | ICD-10-CM | POA: Diagnosis not present

## 2021-06-01 DIAGNOSIS — E43 Unspecified severe protein-calorie malnutrition: Secondary | ICD-10-CM | POA: Insufficient documentation

## 2021-06-01 DIAGNOSIS — Z882 Allergy status to sulfonamides status: Secondary | ICD-10-CM | POA: Insufficient documentation

## 2021-06-01 DIAGNOSIS — Z803 Family history of malignant neoplasm of breast: Secondary | ICD-10-CM | POA: Insufficient documentation

## 2021-06-01 DIAGNOSIS — Z823 Family history of stroke: Secondary | ICD-10-CM | POA: Insufficient documentation

## 2021-06-01 DIAGNOSIS — E876 Hypokalemia: Secondary | ICD-10-CM

## 2021-06-01 DIAGNOSIS — Z87891 Personal history of nicotine dependence: Secondary | ICD-10-CM | POA: Insufficient documentation

## 2021-06-01 DIAGNOSIS — C253 Malignant neoplasm of pancreatic duct: Secondary | ICD-10-CM | POA: Diagnosis not present

## 2021-06-01 DIAGNOSIS — Z5111 Encounter for antineoplastic chemotherapy: Secondary | ICD-10-CM | POA: Diagnosis present

## 2021-06-01 DIAGNOSIS — C259 Malignant neoplasm of pancreas, unspecified: Secondary | ICD-10-CM

## 2021-06-01 DIAGNOSIS — Z808 Family history of malignant neoplasm of other organs or systems: Secondary | ICD-10-CM | POA: Diagnosis not present

## 2021-06-01 DIAGNOSIS — D539 Nutritional anemia, unspecified: Secondary | ICD-10-CM

## 2021-06-01 DIAGNOSIS — K589 Irritable bowel syndrome without diarrhea: Secondary | ICD-10-CM | POA: Insufficient documentation

## 2021-06-01 DIAGNOSIS — C257 Malignant neoplasm of other parts of pancreas: Secondary | ICD-10-CM | POA: Diagnosis present

## 2021-06-01 DIAGNOSIS — Z8042 Family history of malignant neoplasm of prostate: Secondary | ICD-10-CM | POA: Insufficient documentation

## 2021-06-01 LAB — CBC WITH DIFFERENTIAL/PLATELET
Abs Immature Granulocytes: 0.01 10*3/uL (ref 0.00–0.07)
Basophils Absolute: 0 10*3/uL (ref 0.0–0.1)
Basophils Relative: 0 %
Eosinophils Absolute: 0.2 10*3/uL (ref 0.0–0.5)
Eosinophils Relative: 4 %
HCT: 28 % — ABNORMAL LOW (ref 36.0–46.0)
Hemoglobin: 9.2 g/dL — ABNORMAL LOW (ref 12.0–15.0)
Immature Granulocytes: 0 %
Lymphocytes Relative: 24 %
Lymphs Abs: 1.2 10*3/uL (ref 0.7–4.0)
MCH: 33.7 pg (ref 26.0–34.0)
MCHC: 32.9 g/dL (ref 30.0–36.0)
MCV: 102.6 fL — ABNORMAL HIGH (ref 80.0–100.0)
Monocytes Absolute: 0.4 10*3/uL (ref 0.1–1.0)
Monocytes Relative: 9 %
Neutro Abs: 3.1 10*3/uL (ref 1.7–7.7)
Neutrophils Relative %: 63 %
Platelets: 147 10*3/uL — ABNORMAL LOW (ref 150–400)
RBC: 2.73 MIL/uL — ABNORMAL LOW (ref 3.87–5.11)
RDW: 15 % (ref 11.5–15.5)
WBC: 4.9 10*3/uL (ref 4.0–10.5)
nRBC: 0 % (ref 0.0–0.2)

## 2021-06-01 LAB — COMPREHENSIVE METABOLIC PANEL
ALT: 24 U/L (ref 0–44)
AST: 46 U/L — ABNORMAL HIGH (ref 15–41)
Albumin: 2.8 g/dL — ABNORMAL LOW (ref 3.5–5.0)
Alkaline Phosphatase: 131 U/L — ABNORMAL HIGH (ref 38–126)
Anion gap: 3 — ABNORMAL LOW (ref 5–15)
BUN: 6 mg/dL (ref 6–20)
CO2: 22 mmol/L (ref 22–32)
Calcium: 8.1 mg/dL — ABNORMAL LOW (ref 8.9–10.3)
Chloride: 113 mmol/L — ABNORMAL HIGH (ref 98–111)
Creatinine, Ser: 0.91 mg/dL (ref 0.44–1.00)
GFR, Estimated: >60 mL/min (ref >60)
Glucose, Bld: 158 mg/dL — ABNORMAL HIGH (ref 70–99)
Potassium: 2.8 mmol/L — ABNORMAL LOW (ref 3.5–5.1)
Sodium: 138 mmol/L (ref 135–145)
Total Bilirubin: 0.3 mg/dL (ref 0.3–1.2)
Total Protein: 5.8 g/dL — ABNORMAL LOW (ref 6.5–8.1)

## 2021-06-01 MED ORDER — SODIUM CHLORIDE 0.9% FLUSH
10.0000 mL | Freq: Once | INTRAVENOUS | Status: AC
  Administered 2021-06-01: 10 mL via INTRAVENOUS
  Filled 2021-06-01: qty 10

## 2021-06-01 MED ORDER — HEPARIN SOD (PORK) LOCK FLUSH 100 UNIT/ML IV SOLN
INTRAVENOUS | Status: AC
  Filled 2021-06-01: qty 5

## 2021-06-01 MED ORDER — HEPARIN SOD (PORK) LOCK FLUSH 100 UNIT/ML IV SOLN
500.0000 [IU] | Freq: Once | INTRAVENOUS | Status: DC
  Filled 2021-06-01: qty 5

## 2021-06-01 MED ORDER — HEPARIN SOD (PORK) LOCK FLUSH 100 UNIT/ML IV SOLN
500.0000 [IU] | Freq: Once | INTRAVENOUS | Status: AC
  Administered 2021-06-01: 500 [IU] via INTRAVENOUS
  Filled 2021-06-01: qty 5

## 2021-06-01 MED ORDER — POTASSIUM CHLORIDE IN NACL 20-0.9 MEQ/L-% IV SOLN
Freq: Once | INTRAVENOUS | Status: AC
  Filled 2021-06-01: qty 1000

## 2021-06-01 NOTE — Progress Notes (Signed)
? ? ? ?Hematology/Oncology Consult note ?[place]  ?Telephone:(336) [phone] [phone] ? ?Patient Care Team: ?Cletis Athens, MD as PCP - General (Internal Medicine) ?Grant Fontana, [place] as Referring Physician (Chiropractic Medicine) ?Christene Lye, MD (General Surgery) ?Sindy Guadeloupe, MD as Consulting Physician (Hematology and Oncology)  ? ?Name of the patient: Sheryl Perez  ?[id number]  ?05-30-1960  ? ?Date of visit: 06/01/21 ? ?Diagnosis- pancreatic adenocarcinoma stage II apT2 N1 M0 ? ?Chief complaint/ Reason for visit-routine follow-up of pancreatic cancer ? ?Heme/Onc history: patient is a 61 year old female with a history of irritable bowel disease for which she follows up with [facility].  She has undergone EGD and colonoscopy with them in the past.  More recently patient was complaining of left upper quadrant and epigastric abdominal pain which causes sudden episodes of abdominal spasms and was prescribed as needed tramadol for it.  These episodes of pain are unrelated to food and are dull aching or throbbing.  She has undergone cholecystectomy in the past.  She underwent CT abdomen and pelvis with contrast in the ER on 08/04/2020 Which did not reveal any acute pathology which showed a possible hypoechoic 1.9 cm right hepatic lobe lesion.  This was followed by an MRI which showed no discrete lesion in the liver and the area of concern noted on CT scan was more likely to represent variable fat deposition.  However there was loss of normal T1 signal in the peripheral pancreas and a potential small pancreatic lesion suspicious for early pancreatic adenocarcinoma in the neck of the pancreas.   ?  ?Patient underwent EUS by Dr. Mont Dutton which showed an irregular mass in the pancreatic neck measuring 15 x 11 mm.  FNA showed adenocarcinoma.  Pancreatic duct measured 1.1 mm in the head with abrupt dilatation in the region of the mass to 4.1 mm.  No abnormality noted in the common  bile duct and hepatic duct.  Celiac region showed no significant endosonographic abnormality.  No lymphadenopathy was seen.  CA 19-9 mildly elevated at 35 ?  ?Patient was seen by Dr. Hyman Hopes from pancreaticobiliary surgery at [facility] for surgical opinion.  Although patient has a small lesion involving the pancreatic neck, there was a concern for tumor extension towards the splenic vein and narrowing of portal splenic confluence.  Patient received 3 months of neoadjuvant modified FOLFIRINOX chemotherapyAnd then underwent definitive Whipple surgery at [facility] with Dr. Hyman Hopes on 01/13/2021.  Final pathology showed 2.3 cm grade 2 moderately differentiated adenocarcinoma with lymphovascular and perineural invasion.  Treatment effect present with residual cancer showing evident tumor regression but more than single cells or rare small group of cancer cells] partial response.  Margins negative.  1 out of 22 lymph nodes positive for malignancy. ?  ?Patient completed adjuvant chemotherapy in March 2023 ? ?Interval history-patient still reports feeling fatigued.  She has not been taking her protein drinks as she does not like the taste of it. ? ?ECOG PS- 2 ?Pain scale- 0 ? ? ?Review of systems- Review of Systems  ?Constitutional:  Positive for malaise/fatigue. Negative for chills, fever and weight loss.  ?HENT:  Negative for congestion, ear discharge and nosebleeds.   ?Eyes:  Negative for blurred vision.  ?Respiratory:  Negative for cough, hemoptysis, sputum production, shortness of breath and wheezing.   ?Cardiovascular:  Negative for chest pain, palpitations, orthopnea and claudication.  ?Gastrointestinal:  Negative for abdominal pain, blood in stool, constipation, diarrhea, heartburn, melena, nausea and vomiting.  ?Genitourinary:  Negative for  dysuria, flank pain, frequency, hematuria and urgency.  ?Musculoskeletal:  Negative for back pain, joint pain and myalgias.  ?Skin:  Negative for rash.  ?Neurological:  Negative for  dizziness, tingling, focal weakness, seizures, weakness and headaches.  ?Endo/Heme/Allergies:  Does not bruise/bleed easily.  ?Psychiatric/Behavioral:  Negative for depression and suicidal ideas. The patient does not have insomnia.    ? ? ? ?Allergies  ?Allergen Reactions  ? Sulfacetamide Sodium Swelling  ?  Diarrhea,dizziness, tongue swelling  ? Other Other (See Comments) and Swelling  ?  PT states that anything with the sides effects "may cause rash" she cannot take due to her psoriasis, it will make the patches on her hand so thick she can't use her hands.  ?Diarrhea,dizziness, tongue swelling  ? Oxycontin [Oxycodone Hcl] Nausea And Vomiting  ? Sulfa Antibiotics Swelling  ?  Diarrhea,dizziness, tongue swelling  ? Abilify [Aripiprazole] Other (See Comments)  ? Cephalosporins Rash  ? Erythromycin Other (See Comments)  ?  Abd. pain ?Abd. pain ?Other reaction(s): Other (See Comments) ?Abd. pain  ? Lamotrigine Rash and Other (See Comments)  ?  psorasis worsen ?psorasis worsen  ? Risperidone Other (See Comments)  ?  tremors ?tremors ?tremors  ? ? ? ?Past Medical History:  ?Diagnosis Date  ? Back injury   ? 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 Bipolar disorder (HCC)   ? Chest pain, unspecified   ? Chronic low back pain 10/23/2013  ? Chronic UTI   ? COPD (chronic obstructive pulmonary disease) ([place])   ? Depression   ? Dysfunction of eustachian tube   ? Enlargement of lymph nodes   ? Family history of brain cancer   ? Family history of breast cancer   ? Family history of prostate cancer   ? History of Bell's palsy Right  ? Injury, other and unspecified, knee, leg, ankle, and foot   ? MRSA (methicillin resistant staph aureus) culture positive 07/11/2014  ? abscess  ? Other specified disease of hair and hair follicles   ? Pancreatic cancer ([place])   ? Pancreatic cancer ([place])   ? Rectal prolapse   ? Unspecified symptom associated with female genital organs   ? ? ? ?Past Surgical History:  ?Procedure  Laterality Date  ? ABDOMINAL HYSTERECTOMY    ? bladder tack  2010  ? CHOLECYSTECTOMY  2002  ? FOOT SURGERY Bilateral 2004  ? bunion  ? GANGLION CYST EXCISION  2002  ? INCISION AND DRAINAGE ABSCESS  04-16-15  ? PARTIAL HYSTERECTOMY  2010  ? uterus removed  ? PORTA CATH INSERTION N/A 09/11/2020  ? Procedure: PORTA CATH INSERTION;  Surgeon: Algernon Huxley, MD;  Location: [facility];  Service: Cardiovascular;  Laterality: N/A;  ? ? ?Social History  ? ?Socioeconomic History  ? Marital status: Married  ?  Spouse name: Not on file  ? Number of children: 3  ? Years of education: hs  ? Highest education level: Some college, no degree  ?Occupational History  ? Not on file  ?Tobacco Use  ? Smoking status: Former  ?  Packs/day: 0.50  ?  Years: 40.00  ?  Pack years: 20.00  ?  Types: Cigarettes  ?  Quit date: 07/14/2020  ?  Years since quitting: 0.8  ? Smokeless tobacco: Never  ?Vaping Use  ? Vaping Use: Never used  ?Substance and Sexual Activity  ? Alcohol use: No  ?  Alcohol/week: 1.0 standard drink  ?  Types: 1  Glasses of wine per week  ? Drug use: Yes  ?  Types: Marijuana  ?  Comment: last used 05-20-15  ? Sexual activity: Yes  ?  Birth control/protection: None  ?Other Topics Concern  ? Not on file  ?Social History Narrative  ? Patient lives at home with her husband [name]).  ? 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  ? Caffeine Tea , Soda.  ? ?Social Determinants of Health  ? ?Financial Resource Strain: Not on file  ?Food Insecurity: Not on file  ?Transportation Needs: Not on file  ?Physical Activity: Not on file  ?Stress: Not on file  ?Social Connections: Not on file  ?Intimate Partner Violence: Not on file  ? ? ?Family History  ?Problem Relation Age of Onset  ? Diabetes Father   ? Cancer Father   ?     brain tumor  ? Depression Father   ? Depression Sister   ? Alcohol abuse Brother   ? Depression Brother   ? Leukemia Maternal Aunt   ? Prostate cancer Maternal Uncle   ? Breast cancer Paternal Aunt   ?      dx >50  ? Heart disease Maternal Grandmother   ? Heart attack Maternal Grandmother   ? Stroke Maternal Grandmother   ? Parkinson's disease Maternal Grandfather   ? Leukemia Paternal Grandmother   ? Diabetes Pater

## 2021-06-02 LAB — CANCER ANTIGEN 19-9: CA 19-9: 34 U/mL (ref 0–35)

## 2021-06-03 ENCOUNTER — Telehealth: Payer: Self-pay

## 2021-06-03 NOTE — Telephone Encounter (Signed)
Order for acupuncture due to numbness/tingling after chemo and lack of appetite has been faxed to [facility] to [phone] and have receieved a fax confirmation.  ?

## 2021-06-08 ENCOUNTER — Other Ambulatory Visit: Payer: Self-pay

## 2021-06-08 ENCOUNTER — Telehealth: Payer: Self-pay | Admitting: *Deleted

## 2021-06-08 ENCOUNTER — Inpatient Hospital Stay: Payer: BC Managed Care – PPO

## 2021-06-08 DIAGNOSIS — C259 Malignant neoplasm of pancreas, unspecified: Secondary | ICD-10-CM

## 2021-06-08 NOTE — Telephone Encounter (Signed)
Pt called and scheduled for port labs/IV fluids today.  ?

## 2021-06-08 NOTE — Telephone Encounter (Signed)
Can you call and schedule?

## 2021-06-08 NOTE — Telephone Encounter (Signed)
Patient called reporting that she is felling funny and thinks she may need IV potassium and is asking t come in to have all her labs levels checked and possibly get IV fluids and IV K+ ?

## 2021-06-09 ENCOUNTER — Encounter: Payer: Self-pay | Admitting: Internal Medicine

## 2021-06-09 ENCOUNTER — Other Ambulatory Visit: Payer: Self-pay

## 2021-06-09 ENCOUNTER — Ambulatory Visit (INDEPENDENT_AMBULATORY_CARE_PROVIDER_SITE_OTHER): Payer: BC Managed Care – PPO | Admitting: Internal Medicine

## 2021-06-09 ENCOUNTER — Inpatient Hospital Stay (HOSPITAL_BASED_OUTPATIENT_CLINIC_OR_DEPARTMENT_OTHER): Payer: BC Managed Care – PPO | Admitting: Hospice and Palliative Medicine

## 2021-06-09 ENCOUNTER — Inpatient Hospital Stay: Payer: BC Managed Care – PPO

## 2021-06-09 ENCOUNTER — Inpatient Hospital Stay: Payer: BC Managed Care – PPO | Attending: Oncology

## 2021-06-09 VITALS — BP 100/41 | HR 59 | Ht 60.0 in | Wt 110.9 lb

## 2021-06-09 VITALS — BP 109/43 | HR 61 | Temp 97.5°F | Resp 16 | Wt 110.8 lb

## 2021-06-09 DIAGNOSIS — R1013 Epigastric pain: Secondary | ICD-10-CM | POA: Diagnosis not present

## 2021-06-09 DIAGNOSIS — Z803 Family history of malignant neoplasm of breast: Secondary | ICD-10-CM | POA: Diagnosis not present

## 2021-06-09 DIAGNOSIS — R609 Edema, unspecified: Secondary | ICD-10-CM

## 2021-06-09 DIAGNOSIS — Z87440 Personal history of urinary (tract) infections: Secondary | ICD-10-CM | POA: Diagnosis not present

## 2021-06-09 DIAGNOSIS — Z8042 Family history of malignant neoplasm of prostate: Secondary | ICD-10-CM | POA: Insufficient documentation

## 2021-06-09 DIAGNOSIS — M545 Low back pain, unspecified: Secondary | ICD-10-CM | POA: Insufficient documentation

## 2021-06-09 DIAGNOSIS — I700 Atherosclerosis of aorta: Secondary | ICD-10-CM | POA: Diagnosis not present

## 2021-06-09 DIAGNOSIS — E876 Hypokalemia: Secondary | ICD-10-CM | POA: Insufficient documentation

## 2021-06-09 DIAGNOSIS — Z9049 Acquired absence of other specified parts of digestive tract: Secondary | ICD-10-CM | POA: Diagnosis not present

## 2021-06-09 DIAGNOSIS — Z882 Allergy status to sulfonamides status: Secondary | ICD-10-CM | POA: Diagnosis not present

## 2021-06-09 DIAGNOSIS — E46 Unspecified protein-calorie malnutrition: Secondary | ICD-10-CM | POA: Diagnosis not present

## 2021-06-09 DIAGNOSIS — Z823 Family history of stroke: Secondary | ICD-10-CM | POA: Insufficient documentation

## 2021-06-09 DIAGNOSIS — Z885 Allergy status to narcotic agent status: Secondary | ICD-10-CM | POA: Diagnosis not present

## 2021-06-09 DIAGNOSIS — R1012 Left upper quadrant pain: Secondary | ICD-10-CM | POA: Insufficient documentation

## 2021-06-09 DIAGNOSIS — Z833 Family history of diabetes mellitus: Secondary | ICD-10-CM | POA: Insufficient documentation

## 2021-06-09 DIAGNOSIS — Z5111 Encounter for antineoplastic chemotherapy: Secondary | ICD-10-CM | POA: Diagnosis not present

## 2021-06-09 DIAGNOSIS — F319 Bipolar disorder, unspecified: Secondary | ICD-10-CM | POA: Insufficient documentation

## 2021-06-09 DIAGNOSIS — F17200 Nicotine dependence, unspecified, uncomplicated: Secondary | ICD-10-CM | POA: Diagnosis not present

## 2021-06-09 DIAGNOSIS — Z807 Family history of other malignant neoplasms of lymphoid, hematopoietic and related tissues: Secondary | ICD-10-CM | POA: Diagnosis not present

## 2021-06-09 DIAGNOSIS — R5383 Other fatigue: Secondary | ICD-10-CM | POA: Diagnosis not present

## 2021-06-09 DIAGNOSIS — F3162 Bipolar disorder, current episode mixed, moderate: Secondary | ICD-10-CM

## 2021-06-09 DIAGNOSIS — C253 Malignant neoplasm of pancreatic duct: Secondary | ICD-10-CM | POA: Diagnosis not present

## 2021-06-09 DIAGNOSIS — Z808 Family history of malignant neoplasm of other organs or systems: Secondary | ICD-10-CM | POA: Insufficient documentation

## 2021-06-09 DIAGNOSIS — F1290 Cannabis use, unspecified, uncomplicated: Secondary | ICD-10-CM | POA: Diagnosis not present

## 2021-06-09 DIAGNOSIS — G8929 Other chronic pain: Secondary | ICD-10-CM | POA: Diagnosis not present

## 2021-06-09 DIAGNOSIS — Z888 Allergy status to other drugs, medicaments and biological substances status: Secondary | ICD-10-CM | POA: Diagnosis not present

## 2021-06-09 DIAGNOSIS — R600 Localized edema: Secondary | ICD-10-CM | POA: Diagnosis not present

## 2021-06-09 DIAGNOSIS — C259 Malignant neoplasm of pancreas, unspecified: Secondary | ICD-10-CM

## 2021-06-09 DIAGNOSIS — Z79899 Other long term (current) drug therapy: Secondary | ICD-10-CM | POA: Insufficient documentation

## 2021-06-09 DIAGNOSIS — Z881 Allergy status to other antibiotic agents status: Secondary | ICD-10-CM | POA: Diagnosis not present

## 2021-06-09 DIAGNOSIS — J431 Panlobular emphysema: Secondary | ICD-10-CM | POA: Diagnosis not present

## 2021-06-09 DIAGNOSIS — Z8249 Family history of ischemic heart disease and other diseases of the circulatory system: Secondary | ICD-10-CM | POA: Insufficient documentation

## 2021-06-09 DIAGNOSIS — Z8507 Personal history of malignant neoplasm of pancreas: Secondary | ICD-10-CM | POA: Diagnosis not present

## 2021-06-09 DIAGNOSIS — Z95828 Presence of other vascular implants and grafts: Secondary | ICD-10-CM

## 2021-06-09 LAB — CBC WITH DIFFERENTIAL/PLATELET
Abs Immature Granulocytes: 0.01 10*3/uL (ref 0.00–0.07)
Basophils Absolute: 0 10*3/uL (ref 0.0–0.1)
Basophils Relative: 0 %
Eosinophils Absolute: 0.2 10*3/uL (ref 0.0–0.5)
Eosinophils Relative: 6 %
HCT: 27 % — ABNORMAL LOW (ref 36.0–46.0)
Hemoglobin: 8.6 g/dL — ABNORMAL LOW (ref 12.0–15.0)
Immature Granulocytes: 0 %
Lymphocytes Relative: 31 %
Lymphs Abs: 1.1 10*3/uL (ref 0.7–4.0)
MCH: 33.5 pg (ref 26.0–34.0)
MCHC: 31.9 g/dL (ref 30.0–36.0)
MCV: 105.1 fL — ABNORMAL HIGH (ref 80.0–100.0)
Monocytes Absolute: 0.3 10*3/uL (ref 0.1–1.0)
Monocytes Relative: 8 %
Neutro Abs: 2 10*3/uL (ref 1.7–7.7)
Neutrophils Relative %: 55 %
Platelets: 155 10*3/uL (ref 150–400)
RBC: 2.57 MIL/uL — ABNORMAL LOW (ref 3.87–5.11)
RDW: 14.5 % (ref 11.5–15.5)
WBC: 3.6 10*3/uL — ABNORMAL LOW (ref 4.0–10.5)
nRBC: 0 % (ref 0.0–0.2)

## 2021-06-09 LAB — COMPREHENSIVE METABOLIC PANEL
ALT: 43 U/L (ref 0–44)
AST: 71 U/L — ABNORMAL HIGH (ref 15–41)
Albumin: 2.8 g/dL — ABNORMAL LOW (ref 3.5–5.0)
Alkaline Phosphatase: 131 U/L — ABNORMAL HIGH (ref 38–126)
Anion gap: 3 — ABNORMAL LOW (ref 5–15)
BUN: 10 mg/dL (ref 6–20)
CO2: 22 mmol/L (ref 22–32)
Calcium: 8 mg/dL — ABNORMAL LOW (ref 8.9–10.3)
Chloride: 114 mmol/L — ABNORMAL HIGH (ref 98–111)
Creatinine, Ser: 0.84 mg/dL (ref 0.44–1.00)
GFR, Estimated: >60 mL/min (ref >60)
Glucose, Bld: 80 mg/dL (ref 70–99)
Potassium: 4.8 mmol/L (ref 3.5–5.1)
Sodium: 139 mmol/L (ref 135–145)
Total Bilirubin: 0.7 mg/dL (ref 0.3–1.2)
Total Protein: 5.7 g/dL — ABNORMAL LOW (ref 6.5–8.1)

## 2021-06-09 LAB — BRAIN NATRIURETIC PEPTIDE: B Natriuretic Peptide: 146.8 pg/mL — ABNORMAL HIGH (ref 0.0–100.0)

## 2021-06-09 MED ORDER — HEPARIN SOD (PORK) LOCK FLUSH 100 UNIT/ML IV SOLN
500.0000 [IU] | Freq: Once | INTRAVENOUS | Status: AC
  Administered 2021-06-09: 500 [IU] via INTRAVENOUS
  Filled 2021-06-09: qty 5

## 2021-06-09 MED ORDER — SODIUM CHLORIDE 0.9% FLUSH
10.0000 mL | Freq: Once | INTRAVENOUS | Status: AC
  Administered 2021-06-09: 10 mL via INTRAVENOUS
  Filled 2021-06-09: qty 10

## 2021-06-09 NOTE — Assessment & Plan Note (Signed)
Patient states that she has quit smoking ?

## 2021-06-09 NOTE — Assessment & Plan Note (Signed)
Chronic problem. 

## 2021-06-09 NOTE — Progress Notes (Signed)
Pt was scheduled for labs/IV fluids due to "feeling funny" yesterday. Upon arriving to clinic, pt reports that she has developed bilateral lower leg edema and has gained 7+ pounds this week. Pt was therefore added to [facility] schedule. Pt denies shortness of breath or history of heart disease/CHF. She endorses nausea and diarrhea since receiving her last chemo treatment. No fever.  ?

## 2021-06-09 NOTE — Assessment & Plan Note (Signed)
Patient is doing well after whipple procedure. ?

## 2021-06-09 NOTE — Assessment & Plan Note (Signed)
Advised low-cholesterol diet ?

## 2021-06-09 NOTE — Progress Notes (Signed)
? ?[facility] ?[place] at [facility] ?[phone] Fax:([phone] ? ?Patient Care Team: ?Cletis Athens, MD as PCP - General (Internal Medicine) ?Grant Fontana, [place] as Referring Physician (Chiropractic Medicine) ?Christene Lye, MD (General Surgery) ?Sindy Guadeloupe, MD as Consulting Physician (Hematology and Oncology)  ? ?Name of the patient: Sheryl Perez  ?[id number]  ?02-Jul-1960  ? ?Date of visit: 06/09/21 ? ?Reason for Consult: ?FENDI MEINHARDT is a 61 y.o. female with multiple medical problems including panlobular emphysema, history of IBS, bipolar/anxiety, tobacco abuse.  Patient was diagnosed with stage II adenocarcinoma of the pancreas in June 2022.  She is status post neoadjuvant FOLFIRINOX and then underwent definitive Whipple surgery on 01/13/2021.  Patient completed adjuvant chemotherapy in March 2023. ? ?She last saw Dr. Janese Banks on 06/01/2021 at which time patient remained fatigued and was felt to have protein calorie malnutrition.  Plan was for follow-up CT in June 2023.  ? ?Patient requested [facility] visit and labs feeling like she may need fluids and IV potassium.  She complains of bilateral lower extremity edema and weight gain over the past 1 to 2 weeks.  She denies shortness of breath or wheezing, no fever or chills, no nausea or vomiting.  She says her appetite has picked up significantly over the past couple weeks.  She is trying to use protein powder daily.  She endorses chronic diarrhea and uses Imodium for that but it is unchanged in severity or characteristic. ? ?Denies any neurologic complaints. Denies recent fevers or illnesses. Denies any easy bleeding or bruising. Reports good appetite and denies weight loss. Denies chest pain. Denies any nausea, vomiting, constipation. Denies urinary complaints. Patient offers no further specific complaints today. ? ?PAST MEDICAL HISTORY: ?Past Medical History:  ?Diagnosis Date  ? Back injury   ? 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 Bipolar disorder (HCC)   ? Chest pain, unspecified   ? Chronic low back pain 10/23/2013  ? Chronic UTI   ? COPD (chronic obstructive pulmonary disease) ([place])   ? Depression   ? Dysfunction of eustachian tube   ? Enlargement of lymph nodes   ? Family history of brain cancer   ? Family history of breast cancer   ? Family history of prostate cancer   ? History of Bell's palsy Right  ? Injury, other and unspecified, knee, leg, ankle, and foot   ? MRSA (methicillin resistant staph aureus) culture positive 07/11/2014  ? abscess  ? Other specified disease of hair and hair follicles   ? Pancreatic cancer ([place])   ? Pancreatic cancer ([place])   ? Rectal prolapse   ? Unspecified symptom associated with female genital organs   ? ? ?PAST SURGICAL HISTORY:  ?Past Surgical History:  ?Procedure Laterality Date  ? ABDOMINAL HYSTERECTOMY    ? bladder tack  2010  ? CHOLECYSTECTOMY  2002  ? FOOT SURGERY Bilateral 2004  ? bunion  ? GANGLION CYST EXCISION  2002  ? INCISION AND DRAINAGE ABSCESS  04-16-15  ? PARTIAL HYSTERECTOMY  2010  ? uterus removed  ? PORTA CATH INSERTION N/A 09/11/2020  ? Procedure: PORTA CATH INSERTION;  Surgeon: Algernon Huxley, MD;  Location: [place] [facility];  Service: Cardiovascular;  Laterality: N/A;  ? ? ?HEMATOLOGY/ONCOLOGY HISTORY:  ?Oncology History  ?Pancreatic adenocarcinoma ([place])  ?09/01/2020 Initial Diagnosis  ? Pancreatic adenocarcinoma ([place]) ? ?  ?09/01/2020 Cancer Staging  ? Staging form: Exocrine Pancreas, AJCC 8th Edition ?-  Clinical stage from 09/01/2020: Stage IA (cT1, cN0, cM0) - Signed by Sindy Guadeloupe, MD on 09/01/2020 ?Total positive nodes: 0 ? ?  ?09/12/2020 -  Chemotherapy  ? Patient is on Treatment Plan : PANCREAS Modified FOLFIRINOX q14d x 4 cycles  ? ?  ?  ? Genetic Testing  ? Negative genetic testing. No pathogenic variants identified on the [employer] CancerNext-Expanded+RNA Panel. VUS in [place] identified. The report date is 10/09/2020. ? ?The  CancerNext-Expanded + RNAinsight gene panel offered by [employer] and includes sequencing and rearrangement analysis for the following 77 genes: IP, ALK, APC*, ATM*, AXIN2, BAP1, BARD1, BLM, BMPR1A, BRCA1*, BRCA2*, BRIP1*, CDC73, CDH1*,CDK4, CDKN1B, CDKN2A, CHEK2*, CTNNA1, DICER1, FANCC, FH, FLCN, GALNT12, KIF1B, LZTR1, MAX, MEN1, MET, MLH1*, MSH2*, MSH3, MSH6*, MUTYH*, NBN, NF1*, NF2, NTHL1, PALB2*, PHOX2B, PMS2*, POT1, PRKAR1A, PTCH1, PTEN*, RAD51C*, RAD51D*,RB1, RECQL, RET, SDHA, SDHAF2, SDHB, SDHC, SDHD, SMAD4, SMARCA4, SMARCB1, SMARCE1, STK11, SUFU, TMEM127, TP53*,TSC1, TSC2, VHL and XRCC2 (sequencing and deletion/duplication); EGFR, EGLN1, HOXB13, KIT, MITF, PDGFRA, POLD1 and POLE (sequencing only); EPCAM and GREM1 (deletion/duplication only). ?  ?02/16/2021 Cancer Staging  ? Staging form: Exocrine Pancreas, AJCC 8th Edition ?- Pathologic stage from 02/16/2021: Stage IIB (pT2, pN1, cM0) - Signed by Sindy Guadeloupe, MD on 02/16/2021 ?Residual tumor (R): R0 - None ? ?  ? ? ?ALLERGIES:  is allergic to sulfacetamide sodium, other, oxycontin [oxycodone hcl], sulfa antibiotics, abilify [aripiprazole], cephalosporins, erythromycin, lamotrigine, and risperidone. ? ?MEDICATIONS:  ?Current Outpatient Medications  ?Medication Sig Dispense Refill  ? ALPRAZolam (XANAX) 0.5 MG tablet Take 1 tablet (0.5 mg total) by mouth 2 (two) times daily as needed for anxiety. 20 tablet 1  ? Ascorbic Acid (VITAMIN C) 1000 MG tablet Take 1,000 mg by mouth daily.    ? divalproex (DEPAKOTE) 250 MG DR tablet Take 1 tablet (250 mg total) by mouth 2 (two) times daily. 180 tablet 1  ? famotidine (PEPCID) 20 MG tablet Take 1 tablet by mouth 2 (two) times daily.    ? ferrous sulfate 325 (65 FE) MG tablet Take 325 mg by mouth 2 (two) times daily with a meal.    ? fluticasone (FLONASE) 50 MCG/ACT nasal spray Place 1 spray into both nostrils daily. 11.1 mL 2  ? loperamide (IMODIUM A-D) 2 MG tablet Take 2 at onset of diarrhea, then 1 every 2hrs until  12hr without a BM. May take 2 tab every 4hrs at bedtime. If diarrhea recurs repeat. 100 tablet 1  ? loratadine (CLARITIN) 10 MG tablet Take 10 mg by mouth daily.    ? Multiple Vitamin (MULTIVITAMIN) tablet Take 1 tablet by mouth daily.    ? naproxen sodium (ALEVE) 220 MG tablet Take 220 mg by mouth daily as needed.    ? ondansetron (ZOFRAN) 8 MG tablet Take 1 tablet (8 mg total) by mouth 2 (two) times daily as needed. Start on day 3 after chemotherapy. (Patient not taking: Reported on 06/01/2021) 30 tablet 1  ? ondansetron (ZOFRAN-ODT) 4 MG disintegrating tablet Take 4 mg by mouth every 8 (eight) hours as needed. (Patient not taking: Reported on 06/01/2021)    ? potassium chloride SA (KLOR-CON M) 20 MEQ tablet Take 1 tablet (20 mEq total) by mouth daily. 30 tablet 3  ? prochlorperazine (COMPAZINE) 10 MG tablet Take 1 tablet (10 mg total) by mouth every 6 (six) hours as needed (Nausea or vomiting). (Patient not taking: Reported on 04/17/2021) 30 tablet 1  ? propranolol (INDERAL) 20 MG tablet Take 1 tablet (20 mg total)  by mouth 3 (three) times daily. 270 tablet 1  ? ?No current facility-administered medications for this visit.  ? ?Facility-Administered Medications Ordered in Other Visits  ?Medication Dose Route Frequency Provider Last Rate Last Admin  ? heparin lock flush 100 UNIT/ML injection           ? heparin lock flush 100 unit/mL  500 Units Intravenous Once Sindy Guadeloupe, MD      ? sodium chloride flush (NS) 0.9 % injection 10 mL  10 mL Intracatheter PRN Sindy Guadeloupe, MD   10 mL at 11/03/20 1441  ? sodium chloride flush (NS) 0.9 % injection 10 mL  10 mL Intravenous PRN Sindy Guadeloupe, MD      ? ? ?VITAL SIGNS: ?BP (!) 109/43   Pulse 61   Temp (!) 97.5 ?F (36.4 ?C) (Tympanic)   Resp 16   Wt 110 lb 12 oz (50.2 kg)   LMP 02/09/2008   SpO2 100%   BMI 21.63 kg/m?  ?Filed Weights  ? 06/09/21 0900  ?Weight: 110 lb 12 oz (50.2 kg)  ?  ?Estimated body mass index is 21.63 kg/m? as calculated from the following: ?   Height as of 06/01/21: 5' (1.524 m). ?  Weight as of this encounter: 110 lb 12 oz (50.2 kg). ? ?LABS: ?CBC: ?   ?Component Value Date/Time  ? WBC 4.9 06/01/2021 1008  ? HGB 9.2 (L) 06/01/2021 1008  ? HGB 11.6 02/22/2020 1

## 2021-06-09 NOTE — Progress Notes (Signed)
? ?Established Patient Office Visit ? ?Subjective:  ?Patient ID: Sheryl Perez, female    DOB: [birth year]  Age: 61 y.o. MRN: [id number] ? ?CC:  ?Chief Complaint  ?Patient presents with  ? Hypotension  ?  Patient complains of hypotension.  ? ? ?HPI ? ?Sheryl Perez presents after a Whipple procedure for pancreatic cancer, she is doing well, she has a low blood pressure so I advised her to drink a lot of fluid.  She is not taking any blood pressure medication. ?Past Medical History:  ?Diagnosis Date  ? Back injury   ? 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 Bipolar disorder (HCC)   ? Chest pain, unspecified   ? Chronic low back pain 10/23/2013  ? Chronic UTI   ? COPD (chronic obstructive pulmonary disease) ([place])   ? Depression   ? Dysfunction of eustachian tube   ? Enlargement of lymph nodes   ? Family history of brain cancer   ? Family history of breast cancer   ? Family history of prostate cancer   ? History of Bell's palsy Right  ? Injury, other and unspecified, knee, leg, ankle, and foot   ? MRSA (methicillin resistant staph aureus) culture positive 07/11/2014  ? abscess  ? Other specified disease of hair and hair follicles   ? Pancreatic cancer ([place])   ? Pancreatic cancer ([place])   ? Rectal prolapse   ? Unspecified symptom associated with female genital organs   ? ? ?Past Surgical History:  ?Procedure Laterality Date  ? ABDOMINAL HYSTERECTOMY    ? bladder tack  2010  ? CHOLECYSTECTOMY  2002  ? FOOT SURGERY Bilateral 2004  ? bunion  ? GANGLION CYST EXCISION  2002  ? INCISION AND DRAINAGE ABSCESS  04-16-15  ? PARTIAL HYSTERECTOMY  2010  ? uterus removed  ? PORTA CATH INSERTION N/A 09/11/2020  ? Procedure: PORTA CATH INSERTION;  Surgeon: Algernon Huxley, MD;  Location: [place] [facility];  Service: Cardiovascular;  Laterality: N/A;  ? ? ?Family History  ?Problem Relation Age of Onset  ? Diabetes Father   ? Cancer Father   ?     brain tumor  ? Depression Father   ? Depression Sister   ? Alcohol abuse  Brother   ? Depression Brother   ? Leukemia Maternal Aunt   ? Prostate cancer Maternal Uncle   ? Breast cancer Paternal Aunt   ?     dx >50  ? Heart disease Maternal Grandmother   ? Heart attack Maternal Grandmother   ? Stroke Maternal Grandmother   ? Parkinson's disease Maternal Grandfather   ? Leukemia Paternal Grandmother   ? Diabetes Paternal Grandfather   ? Breast cancer Cousin   ? Brain cancer Niece   ?     dx under 65  ? ? ?Social History  ? ?Socioeconomic History  ? Marital status: Married  ?  Spouse name: Not on file  ? Number of children: 3  ? Years of education: hs  ? Highest education level: Some college, no degree  ?Occupational History  ? Not on file  ?Tobacco Use  ? Smoking status: Former  ?  Packs/day: 0.50  ?  Years: 40.00  ?  Pack years: 20.00  ?  Types: Cigarettes  ?  Quit date: 07/14/2020  ?  Years since quitting: 0.9  ? Smokeless tobacco: Never  ?Vaping Use  ? Vaping Use: Never used  ?Substance and Sexual Activity  ?  Alcohol use: No  ?  Alcohol/week: 1.0 standard drink  ?  Types: 1 Glasses of wine per week  ? Drug use: Yes  ?  Types: Marijuana  ?  Comment: last used 05-20-15  ? Sexual activity: Yes  ?  Birth control/protection: None  ?Other Topics Concern  ? Not on file  ?Social History Narrative  ? Patient lives at home with her husband Sheryl Perez).  ? 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  ? Caffeine Tea , Soda.  ? ?Social Determinants of Health  ? ?Financial Resource Strain: Not on file  ?Food Insecurity: Not on file  ?Transportation Needs: Not on file  ?Physical Activity: Not on file  ?Stress: Not on file  ?Social Connections: Not on file  ?Intimate Partner Violence: Not on file  ? ? ? ?Current Outpatient Medications:  ?  Ascorbic Acid (VITAMIN C) 1000 MG tablet, Take 1,000 mg by mouth daily., Disp: , Rfl:  ?  divalproex (DEPAKOTE) 250 MG DR tablet, Take 1 tablet (250 mg total) by mouth 2 (two) times daily. (Patient taking differently: Take 250 mg by mouth 3 (three) times  daily.), Disp: 180 tablet, Rfl: 1 ?  famotidine (PEPCID) 20 MG tablet, Take 1 tablet by mouth 2 (two) times daily., Disp: , Rfl:  ?  ferrous sulfate 325 (65 FE) MG tablet, Take 325 mg by mouth 2 (two) times daily with a meal., Disp: , Rfl:  ?  loperamide (IMODIUM A-D) 2 MG tablet, Take 2 at onset of diarrhea, then 1 every 2hrs until 12hr without a BM. May take 2 tab every 4hrs at bedtime. If diarrhea recurs repeat., Disp: 100 tablet, Rfl: 1 ?  loratadine (CLARITIN) 10 MG tablet, Take 10 mg by mouth daily., Disp: , Rfl:  ?  Multiple Vitamin (MULTIVITAMIN) tablet, Take 1 tablet by mouth daily., Disp: , Rfl:  ?  potassium chloride SA (KLOR-CON M) 20 MEQ tablet, Take 1 tablet (20 mEq total) by mouth daily. (Patient taking differently: Take 40 mEq by mouth daily.), Disp: 30 tablet, Rfl: 3 ?  propranolol (INDERAL) 20 MG tablet, Take 1 tablet (20 mg total) by mouth 3 (three) times daily., Disp: 270 tablet, Rfl: 1 ?  fluticasone (FLONASE) 50 MCG/ACT nasal spray, Place 1 spray into both nostrils daily., Disp: 11.1 mL, Rfl: 2 ?No current facility-administered medications for this visit. ? ?Facility-Administered Medications Ordered in Other Visits:  ?  heparin lock flush 100 UNIT/ML injection, , , ,  ?  heparin lock flush 100 unit/mL, 500 Units, Intravenous, Once, Sindy Guadeloupe, MD ?  sodium chloride flush (NS) 0.9 % injection 10 mL, 10 mL, Intracatheter, PRN, Sindy Guadeloupe, MD, 10 mL at 11/03/20 1441 ?  sodium chloride flush (NS) 0.9 % injection 10 mL, 10 mL, Intravenous, PRN, Sindy Guadeloupe, MD  ? ?Allergies  ?Allergen Reactions  ? Sulfacetamide Sodium Swelling  ?  Diarrhea,dizziness, tongue swelling  ? Other Other (See Comments) and Swelling  ?  PT states that anything with the sides effects "may cause rash" she cannot take due to her psoriasis, it will make the patches on her hand so thick she can't use her hands.  ?Diarrhea,dizziness, tongue swelling  ? Oxycontin [Oxycodone Hcl] Nausea And Vomiting  ? Sulfa Antibiotics  Swelling  ?  Diarrhea,dizziness, tongue swelling  ? Abilify [Aripiprazole] Other (See Comments)  ? Cephalosporins Rash  ? Erythromycin Other (See Comments)  ?  Abd. pain ?Abd. pain ?Other reaction(s): Other (See  Comments) ?Abd. pain  ? Lamotrigine Rash and Other (See Comments)  ?  psorasis worsen ?psorasis worsen  ? Risperidone Other (See Comments)  ?  tremors ?tremors ?tremors  ? ? ?ROS ?Review of Systems  ?Constitutional: Negative.   ?HENT: Negative.    ?Eyes: Negative.   ?Respiratory: Negative.    ?Cardiovascular: Negative.   ?Gastrointestinal: Negative.   ?Endocrine: Negative.   ?Genitourinary: Negative.   ?Musculoskeletal: Negative.   ?Skin: Negative.   ?Allergic/Immunologic: Negative.   ?Neurological: Negative.   ?Hematological: Negative.   ?Psychiatric/Behavioral: Negative.    ?All other systems reviewed and are negative. ? ?  ?Objective:  ?  ?Physical Exam ?Vitals reviewed.  ?Constitutional:   ?   Appearance: Normal appearance.  ?HENT:  ?   Mouth/Throat:  ?   Mouth: Mucous membranes are moist.  ?Eyes:  ?   Pupils: Pupils are equal, round, and reactive to light.  ?Neck:  ?   Vascular: No carotid bruit.  ?Cardiovascular:  ?   Rate and Rhythm: Normal rate and regular rhythm.  ?   Pulses: Normal pulses.  ?   Heart sounds: Normal heart sounds.  ?Pulmonary:  ?   Effort: Pulmonary effort is normal.  ?   Breath sounds: Normal breath sounds.  ?Abdominal:  ?   General: Bowel sounds are normal.  ?   Palpations: Abdomen is soft. There is no hepatomegaly, splenomegaly or mass.  ?   Tenderness: There is no abdominal tenderness.  ?   Hernia: No hernia is present.  ? ? ?   Comments: Mild discharge from periumbilical area  ?Musculoskeletal:     ?   General: No tenderness.  ?   Cervical back: Neck supple.  ?   Right lower leg: No edema.  ?   Left lower leg: No edema.  ?Skin: ?   Findings: No rash.  ?Neurological:  ?   Mental Status: She is alert and oriented to person, place, and time.  ?   Motor: No weakness.  ?Psychiatric:      ?   Mood and Affect: Mood and affect normal.     ?   Behavior: Behavior normal.  ? ? ?BP (!) 100/41   Pulse (!) 59   Ht 5' (1.524 m)   Wt 110 lb 14.4 oz (50.3 kg)   LMP 02/09/2008   BMI 21.66 kg/m?  ?Wt

## 2021-06-16 ENCOUNTER — Inpatient Hospital Stay: Payer: BC Managed Care – PPO

## 2021-06-16 VITALS — BP 128/55 | HR 68 | Temp 96.5°F | Resp 14

## 2021-06-16 DIAGNOSIS — R5383 Other fatigue: Secondary | ICD-10-CM | POA: Diagnosis not present

## 2021-06-16 DIAGNOSIS — M545 Low back pain, unspecified: Secondary | ICD-10-CM | POA: Diagnosis not present

## 2021-06-16 DIAGNOSIS — Z881 Allergy status to other antibiotic agents status: Secondary | ICD-10-CM | POA: Diagnosis not present

## 2021-06-16 DIAGNOSIS — Z5111 Encounter for antineoplastic chemotherapy: Secondary | ICD-10-CM | POA: Diagnosis not present

## 2021-06-16 DIAGNOSIS — R1012 Left upper quadrant pain: Secondary | ICD-10-CM | POA: Diagnosis not present

## 2021-06-16 DIAGNOSIS — Z87440 Personal history of urinary (tract) infections: Secondary | ICD-10-CM | POA: Diagnosis not present

## 2021-06-16 DIAGNOSIS — C253 Malignant neoplasm of pancreatic duct: Secondary | ICD-10-CM | POA: Diagnosis not present

## 2021-06-16 DIAGNOSIS — E876 Hypokalemia: Secondary | ICD-10-CM

## 2021-06-16 DIAGNOSIS — F319 Bipolar disorder, unspecified: Secondary | ICD-10-CM | POA: Diagnosis not present

## 2021-06-16 DIAGNOSIS — C259 Malignant neoplasm of pancreas, unspecified: Secondary | ICD-10-CM

## 2021-06-16 DIAGNOSIS — R1013 Epigastric pain: Secondary | ICD-10-CM | POA: Diagnosis not present

## 2021-06-16 DIAGNOSIS — Z95828 Presence of other vascular implants and grafts: Secondary | ICD-10-CM

## 2021-06-16 DIAGNOSIS — Z79899 Other long term (current) drug therapy: Secondary | ICD-10-CM | POA: Diagnosis not present

## 2021-06-16 DIAGNOSIS — R600 Localized edema: Secondary | ICD-10-CM | POA: Diagnosis not present

## 2021-06-16 DIAGNOSIS — G8929 Other chronic pain: Secondary | ICD-10-CM | POA: Diagnosis not present

## 2021-06-16 DIAGNOSIS — J431 Panlobular emphysema: Secondary | ICD-10-CM | POA: Diagnosis not present

## 2021-06-16 DIAGNOSIS — F1290 Cannabis use, unspecified, uncomplicated: Secondary | ICD-10-CM | POA: Diagnosis not present

## 2021-06-16 DIAGNOSIS — E46 Unspecified protein-calorie malnutrition: Secondary | ICD-10-CM | POA: Diagnosis not present

## 2021-06-16 LAB — CBC WITH DIFFERENTIAL/PLATELET
Abs Immature Granulocytes: 0.01 10*3/uL (ref 0.00–0.07)
Basophils Absolute: 0 10*3/uL (ref 0.0–0.1)
Basophils Relative: 1 %
Eosinophils Absolute: 0.2 10*3/uL (ref 0.0–0.5)
Eosinophils Relative: 4 %
HCT: 28.2 % — ABNORMAL LOW (ref 36.0–46.0)
Hemoglobin: 9 g/dL — ABNORMAL LOW (ref 12.0–15.0)
Immature Granulocytes: 0 %
Lymphocytes Relative: 32 %
Lymphs Abs: 1.4 10*3/uL (ref 0.7–4.0)
MCH: 33.3 pg (ref 26.0–34.0)
MCHC: 31.9 g/dL (ref 30.0–36.0)
MCV: 104.4 fL — ABNORMAL HIGH (ref 80.0–100.0)
Monocytes Absolute: 0.4 10*3/uL (ref 0.1–1.0)
Monocytes Relative: 9 %
Neutro Abs: 2.3 10*3/uL (ref 1.7–7.7)
Neutrophils Relative %: 54 %
Platelets: 169 10*3/uL (ref 150–400)
RBC: 2.7 MIL/uL — ABNORMAL LOW (ref 3.87–5.11)
RDW: 13.8 % (ref 11.5–15.5)
WBC: 4.3 10*3/uL (ref 4.0–10.5)
nRBC: 0 % (ref 0.0–0.2)

## 2021-06-16 LAB — BASIC METABOLIC PANEL
Anion gap: 6 (ref 5–15)
BUN: 6 mg/dL (ref 6–20)
CO2: 25 mmol/L (ref 22–32)
Calcium: 8.3 mg/dL — ABNORMAL LOW (ref 8.9–10.3)
Chloride: 105 mmol/L (ref 98–111)
Creatinine, Ser: 0.73 mg/dL (ref 0.44–1.00)
GFR, Estimated: >60 mL/min (ref >60)
Glucose, Bld: 99 mg/dL (ref 70–99)
Potassium: 3.3 mmol/L — ABNORMAL LOW (ref 3.5–5.1)
Sodium: 136 mmol/L (ref 135–145)

## 2021-06-16 LAB — IRON AND TIBC
Iron: 70 ug/dL (ref 28–170)
Saturation Ratios: 26 % (ref 10.4–31.8)
TIBC: 273 ug/dL (ref 250–450)
UIBC: 203 ug/dL

## 2021-06-16 LAB — TSH: TSH: 1.241 u[IU]/mL (ref 0.350–4.500)

## 2021-06-16 LAB — FERRITIN: Ferritin: 215 ng/mL (ref 11–307)

## 2021-06-16 LAB — FOLATE: Folate: 26 ng/mL (ref >5.9)

## 2021-06-16 LAB — VITAMIN B12: Vitamin B-12: 1039 pg/mL — ABNORMAL HIGH (ref 180–914)

## 2021-06-16 MED ORDER — HEPARIN SOD (PORK) LOCK FLUSH 100 UNIT/ML IV SOLN
500.0000 [IU] | Freq: Once | INTRAVENOUS | Status: AC
  Administered 2021-06-16: 500 [IU] via INTRAVENOUS
  Filled 2021-06-16: qty 5

## 2021-06-16 MED ORDER — CYANOCOBALAMIN 1000 MCG/ML IJ SOLN
1000.0000 ug | Freq: Once | INTRAMUSCULAR | Status: AC
  Administered 2021-06-16: 1000 ug via INTRAMUSCULAR
  Filled 2021-06-16: qty 1

## 2021-06-16 MED ORDER — SODIUM CHLORIDE 0.9 % IV SOLN
40.0000 meq | Freq: Once | INTRAVENOUS | Status: AC
  Administered 2021-06-16: 40 meq via INTRAVENOUS
  Filled 2021-06-16: qty 20

## 2021-06-16 MED ORDER — SODIUM CHLORIDE 0.9% FLUSH
10.0000 mL | INTRAVENOUS | Status: DC | PRN
  Administered 2021-06-16: 10 mL via INTRAVENOUS
  Filled 2021-06-16: qty 10

## 2021-06-16 NOTE — Progress Notes (Signed)
Nutrition Follow-up: ? ?Patient with stage I pancreatic cancer.  Patient completed neoadjuvant chemotherapy, s/p whipple on 12/6 at [facility].  Patient completed 11 cycles of folfirinox chemotherapy.   ? ?Met with patient following lab and B12 injection.  Patient reports that she has a good appetite and eating everything in sight.  Says bowel movements are more soft vs watery.  Has them about 4 times per day.  Utilizing imodium.   ? ?Medications: reviewed ? ?Labs: reviewed ? ?Anthropometrics:  ? ?Weight 106 lb 4 oz today (some edema but patient reports better than when seen on 5/2) ?Wearing compression hose ? ?110 lb 14.4 oz on 5/2 (bilateral edema) ?108 lb 3/10 ?113 lb on 2/10 ?112 lb on 1/20 ? ? ?NUTRITION DIAGNOSIS: Unintentional weight loss ongoing ? ? ?INTERVENTION:  ?Patient has been using protein powder mixed in milk 2 times per day.  Adds an orange dreamscile ice cream and makes a shake.  ?Discussed with patient option of adding psyllium fiber to diet to help with firming up stool.  Start with one dose after a meal 1 time per day.  Can increase gradually to up to 4 doses per day.   ?  ? ?MONITORING, EVALUATION, GOAL: weight trends, intake ? ? ?NEXT VISIT: as needed ? ?Jude Naclerio B. Zenia Resides, RD, LDN ?Registered Dietitian ?[phone] ? ? ?

## 2021-06-17 ENCOUNTER — Telehealth: Payer: Self-pay

## 2021-06-17 NOTE — Telephone Encounter (Signed)
pt called asked if she needs labwork before her appt .  ?

## 2021-06-17 NOTE — Telephone Encounter (Signed)
left message on patient phone that no labs were needed.  ?

## 2021-06-17 NOTE — Telephone Encounter (Signed)
No she does not

## 2021-06-18 ENCOUNTER — Telehealth: Payer: Self-pay | Admitting: *Deleted

## 2021-06-18 ENCOUNTER — Encounter: Payer: Self-pay | Admitting: Internal Medicine

## 2021-06-18 NOTE — Telephone Encounter (Signed)
Patient called stating that she called on call last night and was to get a call this morning and she has not yet so she is calling back. Per Dr B note patient needs [facility] visit today for possible infiltration of port and possible need for potassium. Please return her call ASAP ?

## 2021-06-18 NOTE — Progress Notes (Signed)
Patient called regarding severe cramping in the legs/concerns for low potassium. ? ?Also complains of tightness around the port/consent for infiltration  ? ?Recommend evaluation in [facility]-labs- cmp; mag; cbc- possible Kcl 20.

## 2021-06-18 NOTE — Telephone Encounter (Signed)
I got in touch with Sheryl Perez and she came in 3:15 and I spoke to [place] and she wanted both of [place] to look at port and see if there is anything odd, and to put needle in and make sure the port works and if there was any issues that Merrily Pew would see her and Merrily Pew was good with the plan. Sharyn Lull put a note in about the Sheryl Perez. When she came ?

## 2021-06-19 DIAGNOSIS — C259 Malignant neoplasm of pancreas, unspecified: Secondary | ICD-10-CM | POA: Diagnosis not present

## 2021-06-22 ENCOUNTER — Other Ambulatory Visit: Payer: Self-pay | Admitting: Oncology

## 2021-06-22 NOTE — Telephone Encounter (Signed)
Component Ref Range & Units 6 d ago ?(06/16/21) 13 d ago ?(06/09/21) 3 wk ago ?(06/01/21) 1 mo ago ?(05/01/21) 2 mo ago ?(04/17/21) 2 mo ago ?(04/06/21) 2 mo ago ?(04/03/21)  ?Potassium 3.5 - 5.1 mmol/L 3.3 Low   4.8  2.8 Low   3.6  3.1 Low   3.7  2.6 Low Panic  CM   ? ?

## 2021-06-23 ENCOUNTER — Telehealth: Payer: Self-pay | Admitting: *Deleted

## 2021-06-23 ENCOUNTER — Ambulatory Visit: Payer: BC Managed Care – PPO | Admitting: Psychiatry

## 2021-06-23 NOTE — Telephone Encounter (Signed)
Pt called and wants to get another b12 inj. She just had one on 5/9. I checked with Dr/ Janese Banks and she said she can take it monthly. Her b12 level was 1000 so she can only get it monthly. She feels like it gives her energy. I told her that she could have in June and we will make the appt and let her know the date. She is good with this. She wants the appt to be last of the day. She has to pick up her grand kid at 3 pm ?

## 2021-06-28 ENCOUNTER — Encounter: Payer: Self-pay | Admitting: *Deleted

## 2021-07-01 ENCOUNTER — Ambulatory Visit: Payer: BC Managed Care – PPO

## 2021-07-01 ENCOUNTER — Other Ambulatory Visit: Payer: Self-pay

## 2021-07-01 ENCOUNTER — Telehealth: Payer: Self-pay | Admitting: *Deleted

## 2021-07-01 DIAGNOSIS — E876 Hypokalemia: Secondary | ICD-10-CM

## 2021-07-01 NOTE — Telephone Encounter (Signed)
Pt contacted and scheduled for labs/poss fluids tomorrow AM.

## 2021-07-01 NOTE — Telephone Encounter (Signed)
Bmp and possible fluids tomorrow with [facility]

## 2021-07-01 NOTE — Telephone Encounter (Signed)
Patient called requesting to come in for lab check tomorrow stating that she is going to be going out of town and feels "off" like her potassium is low or something and she wants to come in for lab check TOMORROW. Please advise

## 2021-07-02 ENCOUNTER — Inpatient Hospital Stay: Payer: BC Managed Care – PPO

## 2021-07-02 VITALS — BP 112/63 | HR 66 | Resp 19

## 2021-07-02 DIAGNOSIS — Z87440 Personal history of urinary (tract) infections: Secondary | ICD-10-CM | POA: Diagnosis not present

## 2021-07-02 DIAGNOSIS — C253 Malignant neoplasm of pancreatic duct: Secondary | ICD-10-CM | POA: Diagnosis not present

## 2021-07-02 DIAGNOSIS — Z5111 Encounter for antineoplastic chemotherapy: Secondary | ICD-10-CM | POA: Diagnosis not present

## 2021-07-02 DIAGNOSIS — R600 Localized edema: Secondary | ICD-10-CM | POA: Diagnosis not present

## 2021-07-02 DIAGNOSIS — R1013 Epigastric pain: Secondary | ICD-10-CM | POA: Diagnosis not present

## 2021-07-02 DIAGNOSIS — E46 Unspecified protein-calorie malnutrition: Secondary | ICD-10-CM | POA: Diagnosis not present

## 2021-07-02 DIAGNOSIS — J431 Panlobular emphysema: Secondary | ICD-10-CM | POA: Diagnosis not present

## 2021-07-02 DIAGNOSIS — R5383 Other fatigue: Secondary | ICD-10-CM

## 2021-07-02 DIAGNOSIS — M545 Low back pain, unspecified: Secondary | ICD-10-CM | POA: Diagnosis not present

## 2021-07-02 DIAGNOSIS — R1012 Left upper quadrant pain: Secondary | ICD-10-CM | POA: Diagnosis not present

## 2021-07-02 DIAGNOSIS — R531 Weakness: Secondary | ICD-10-CM

## 2021-07-02 DIAGNOSIS — E876 Hypokalemia: Secondary | ICD-10-CM

## 2021-07-02 DIAGNOSIS — G8929 Other chronic pain: Secondary | ICD-10-CM | POA: Diagnosis not present

## 2021-07-02 DIAGNOSIS — F1290 Cannabis use, unspecified, uncomplicated: Secondary | ICD-10-CM | POA: Diagnosis not present

## 2021-07-02 DIAGNOSIS — Z881 Allergy status to other antibiotic agents status: Secondary | ICD-10-CM | POA: Diagnosis not present

## 2021-07-02 DIAGNOSIS — Z79899 Other long term (current) drug therapy: Secondary | ICD-10-CM | POA: Diagnosis not present

## 2021-07-02 DIAGNOSIS — F319 Bipolar disorder, unspecified: Secondary | ICD-10-CM | POA: Diagnosis not present

## 2021-07-02 LAB — BASIC METABOLIC PANEL
Anion gap: 5 (ref 5–15)
BUN: 10 mg/dL (ref 6–20)
CO2: 25 mmol/L (ref 22–32)
Calcium: 8.4 mg/dL — ABNORMAL LOW (ref 8.9–10.3)
Chloride: 108 mmol/L (ref 98–111)
Creatinine, Ser: 0.8 mg/dL (ref 0.44–1.00)
GFR, Estimated: >60 mL/min (ref >60)
Glucose, Bld: 97 mg/dL (ref 70–99)
Potassium: 3.8 mmol/L (ref 3.5–5.1)
Sodium: 138 mmol/L (ref 135–145)

## 2021-07-02 MED ORDER — SODIUM CHLORIDE 0.9 % IV SOLN
Freq: Once | INTRAVENOUS | Status: AC
  Filled 2021-07-02: qty 250

## 2021-07-02 MED ORDER — HEPARIN SOD (PORK) LOCK FLUSH 100 UNIT/ML IV SOLN
500.0000 [IU] | Freq: Once | INTRAVENOUS | Status: AC
  Administered 2021-07-02: 500 [IU] via INTRAVENOUS
  Filled 2021-07-02: qty 5

## 2021-07-02 MED ORDER — SODIUM CHLORIDE 0.9% FLUSH
10.0000 mL | Freq: Once | INTRAVENOUS | Status: AC
  Administered 2021-07-02: 10 mL via INTRAVENOUS
  Filled 2021-07-02: qty 10

## 2021-07-07 ENCOUNTER — Inpatient Hospital Stay: Payer: BC Managed Care – PPO

## 2021-07-07 ENCOUNTER — Inpatient Hospital Stay (HOSPITAL_BASED_OUTPATIENT_CLINIC_OR_DEPARTMENT_OTHER): Payer: BC Managed Care – PPO | Admitting: Oncology

## 2021-07-07 ENCOUNTER — Encounter: Payer: Self-pay | Admitting: Oncology

## 2021-07-07 VITALS — BP 122/46 | HR 62 | Temp 98.5°F | Resp 18 | Wt 99.6 lb

## 2021-07-07 DIAGNOSIS — E876 Hypokalemia: Secondary | ICD-10-CM

## 2021-07-07 DIAGNOSIS — Z881 Allergy status to other antibiotic agents status: Secondary | ICD-10-CM | POA: Diagnosis not present

## 2021-07-07 DIAGNOSIS — C259 Malignant neoplasm of pancreas, unspecified: Secondary | ICD-10-CM

## 2021-07-07 DIAGNOSIS — R1012 Left upper quadrant pain: Secondary | ICD-10-CM | POA: Diagnosis not present

## 2021-07-07 DIAGNOSIS — Z79899 Other long term (current) drug therapy: Secondary | ICD-10-CM | POA: Diagnosis not present

## 2021-07-07 DIAGNOSIS — G8929 Other chronic pain: Secondary | ICD-10-CM | POA: Diagnosis not present

## 2021-07-07 DIAGNOSIS — E46 Unspecified protein-calorie malnutrition: Secondary | ICD-10-CM | POA: Diagnosis not present

## 2021-07-07 DIAGNOSIS — C253 Malignant neoplasm of pancreatic duct: Secondary | ICD-10-CM | POA: Diagnosis not present

## 2021-07-07 DIAGNOSIS — Z5111 Encounter for antineoplastic chemotherapy: Secondary | ICD-10-CM | POA: Diagnosis not present

## 2021-07-07 DIAGNOSIS — E860 Dehydration: Secondary | ICD-10-CM

## 2021-07-07 DIAGNOSIS — Z87440 Personal history of urinary (tract) infections: Secondary | ICD-10-CM | POA: Diagnosis not present

## 2021-07-07 DIAGNOSIS — R600 Localized edema: Secondary | ICD-10-CM | POA: Diagnosis not present

## 2021-07-07 DIAGNOSIS — F319 Bipolar disorder, unspecified: Secondary | ICD-10-CM | POA: Diagnosis not present

## 2021-07-07 DIAGNOSIS — R1013 Epigastric pain: Secondary | ICD-10-CM | POA: Diagnosis not present

## 2021-07-07 DIAGNOSIS — M545 Low back pain, unspecified: Secondary | ICD-10-CM | POA: Diagnosis not present

## 2021-07-07 DIAGNOSIS — J431 Panlobular emphysema: Secondary | ICD-10-CM | POA: Diagnosis not present

## 2021-07-07 DIAGNOSIS — R5383 Other fatigue: Secondary | ICD-10-CM | POA: Diagnosis not present

## 2021-07-07 DIAGNOSIS — F1290 Cannabis use, unspecified, uncomplicated: Secondary | ICD-10-CM | POA: Diagnosis not present

## 2021-07-07 LAB — CBC WITH DIFFERENTIAL/PLATELET
Abs Immature Granulocytes: 0.01 10*3/uL (ref 0.00–0.07)
Basophils Absolute: 0 10*3/uL (ref 0.0–0.1)
Basophils Relative: 0 %
Eosinophils Absolute: 0.1 10*3/uL (ref 0.0–0.5)
Eosinophils Relative: 1 %
HCT: 28.3 % — ABNORMAL LOW (ref 36.0–46.0)
Hemoglobin: 9.3 g/dL — ABNORMAL LOW (ref 12.0–15.0)
Immature Granulocytes: 0 %
Lymphocytes Relative: 26 %
Lymphs Abs: 1.3 10*3/uL (ref 0.7–4.0)
MCH: 33.5 pg (ref 26.0–34.0)
MCHC: 32.9 g/dL (ref 30.0–36.0)
MCV: 101.8 fL — ABNORMAL HIGH (ref 80.0–100.0)
Monocytes Absolute: 0.4 10*3/uL (ref 0.1–1.0)
Monocytes Relative: 8 %
Neutro Abs: 3.3 10*3/uL (ref 1.7–7.7)
Neutrophils Relative %: 65 %
Platelets: 191 10*3/uL (ref 150–400)
RBC: 2.78 MIL/uL — ABNORMAL LOW (ref 3.87–5.11)
RDW: 13 % (ref 11.5–15.5)
WBC: 5.1 10*3/uL (ref 4.0–10.5)
nRBC: 0 % (ref 0.0–0.2)

## 2021-07-07 LAB — COMPREHENSIVE METABOLIC PANEL
ALT: 26 U/L (ref 0–44)
AST: 52 U/L — ABNORMAL HIGH (ref 15–41)
Albumin: 2.8 g/dL — ABNORMAL LOW (ref 3.5–5.0)
Alkaline Phosphatase: 132 U/L — ABNORMAL HIGH (ref 38–126)
Anion gap: 5 (ref 5–15)
BUN: <5 mg/dL — ABNORMAL LOW (ref 6–20)
CO2: 24 mmol/L (ref 22–32)
Calcium: 7.8 mg/dL — ABNORMAL LOW (ref 8.9–10.3)
Chloride: 110 mmol/L (ref 98–111)
Creatinine, Ser: 0.82 mg/dL (ref 0.44–1.00)
GFR, Estimated: >60 mL/min (ref >60)
Glucose, Bld: 189 mg/dL — ABNORMAL HIGH (ref 70–99)
Potassium: 2.7 mmol/L — CL (ref 3.5–5.1)
Sodium: 139 mmol/L (ref 135–145)
Total Bilirubin: 0.4 mg/dL (ref 0.3–1.2)
Total Protein: 6.2 g/dL — ABNORMAL LOW (ref 6.5–8.1)

## 2021-07-07 MED ORDER — SODIUM CHLORIDE 0.9% FLUSH
10.0000 mL | Freq: Once | INTRAVENOUS | Status: AC
  Administered 2021-07-07: 10 mL via INTRAVENOUS
  Filled 2021-07-07: qty 10

## 2021-07-07 MED ORDER — HEPARIN SOD (PORK) LOCK FLUSH 100 UNIT/ML IV SOLN
500.0000 [IU] | Freq: Once | INTRAVENOUS | Status: AC
  Administered 2021-07-07: 500 [IU] via INTRAVENOUS
  Filled 2021-07-07: qty 5

## 2021-07-07 MED ORDER — SODIUM CHLORIDE 0.9 % IV SOLN
Freq: Once | INTRAVENOUS | Status: AC
  Filled 2021-07-07: qty 250

## 2021-07-07 MED ORDER — POTASSIUM CHLORIDE 20 MEQ/100ML IV SOLN
20.0000 meq | Freq: Once | INTRAVENOUS | Status: AC
  Administered 2021-07-07: 20 meq via INTRAVENOUS

## 2021-07-07 NOTE — Progress Notes (Signed)
IVF's with 40 mEq KCL given over 2 hours. Tolerated well. Discharged to home at completion

## 2021-07-08 ENCOUNTER — Inpatient Hospital Stay: Payer: BC Managed Care – PPO

## 2021-07-08 LAB — CANCER ANTIGEN 19-9: CA 19-9: 32 U/mL (ref 0–35)

## 2021-07-08 NOTE — Progress Notes (Signed)
Nutrition  Called patient for scheduled nutrition phone follow-up visit.  No answer.  Left message with call back number. Will follow-up at 6/27 office visit.   Sheryl Perez B. Zenia Resides, [place], [place] Registered Dietitian [phone]

## 2021-07-09 ENCOUNTER — Encounter: Payer: Self-pay | Admitting: Oncology

## 2021-07-09 NOTE — Progress Notes (Signed)
Hematology/Oncology Consult note [facility]  Telephone:([phone] [phone]  Patient Care Team: Cletis Athens, MD as PCP - General (Internal Medicine) Grant Fontana, [place] as Referring Physician (Chiropractic Medicine) Christene Lye, MD (General Surgery) Sindy Guadeloupe, MD as Consulting Physician (Hematology and Oncology)   Name of the patient: Sheryl Perez  [id number]  [birth year]   Date of visit: 07/09/21  Diagnosis- pancreatic adenocarcinoma stage II apT2 N1 M0  Chief complaint/ Reason for visit-follow-up of pancreatic cancer  Heme/Onc history: patient is a 61 year old female with a history of irritable bowel disease for which she follows up with [facility].  She has undergone EGD and colonoscopy with them in the past.  More recently patient was complaining of left upper quadrant and epigastric abdominal pain which causes sudden episodes of abdominal spasms and was prescribed as needed tramadol for it.  These episodes of pain are unrelated to food and are dull aching or throbbing.  She has undergone cholecystectomy in the past.  She underwent CT abdomen and pelvis with contrast in the ER on 08/04/2020 Which did not reveal any acute pathology which showed a possible hypoechoic 1.9 cm right hepatic lobe lesion.  This was followed by an MRI which showed no discrete lesion in the liver and the area of concern noted on CT scan was more likely to represent variable fat deposition.  However there was loss of normal T1 signal in the peripheral pancreas and a potential small pancreatic lesion suspicious for early pancreatic adenocarcinoma in the neck of the pancreas.     Patient underwent EUS by Dr. Mont Dutton which showed an irregular mass in the pancreatic neck measuring 15 x 11 mm.  FNA showed adenocarcinoma.  Pancreatic duct measured 1.1 mm in the head with abrupt dilatation in the region of the mass to 4.1 mm.  No abnormality noted in the common bile duct  and hepatic duct.  Celiac region showed no significant endosonographic abnormality.  No lymphadenopathy was seen.  CA 19-9 mildly elevated at 35   Patient was seen by Dr. Hyman Hopes from pancreaticobiliary surgery at [facility] for surgical opinion.  Although patient has a small lesion involving the pancreatic neck, there was a concern for tumor extension towards the splenic vein and narrowing of portal splenic confluence.  Patient received 3 months of neoadjuvant modified FOLFIRINOX chemotherapyAnd then underwent definitive Whipple [facility] at [facility] with Dr. Hyman Hopes on 01/13/2021.  Final pathology showed 2.3 cm grade 2 moderately differentiated adenocarcinoma with lymphovascular and perineural invasion.  Treatment effect present with residual cancer showing evident tumor regression but more than single cells or rare small group of cancer cells] partial response.  Margins negative.  1 out of 22 lymph nodes positive for malignancy.   Patient completed adjuvant chemotherapy in March 2023  Interval history-a lot of personal stress.  She is concerned that she may be going through a divorce soon.  Appetite is still not good although she is trying to eat.  Denies any significant nausea or vomiting  ECOG PS- 1 Pain scale- 0   Review of systems- Review of Systems  Constitutional:  Positive for malaise/fatigue. Negative for chills, fever and weight loss.       Lack of appetite  HENT:  Negative for congestion, ear discharge and nosebleeds.   Eyes:  Negative for blurred vision.  Respiratory:  Negative for cough, hemoptysis, sputum production, shortness of breath and wheezing.   Cardiovascular:  Negative for chest pain, palpitations, orthopnea and claudication.  Gastrointestinal:  Negative for abdominal pain, blood in stool, constipation, diarrhea, heartburn, melena, nausea and vomiting.  Genitourinary:  Negative for dysuria, flank pain, frequency, hematuria and urgency.  Musculoskeletal:  Negative for back pain,  joint pain and myalgias.  Skin:  Negative for rash.  Neurological:  Negative for dizziness, tingling, focal weakness, seizures, weakness and headaches.  Endo/Heme/Allergies:  Does not bruise/bleed easily.  Psychiatric/Behavioral:  Negative for depression and suicidal ideas. The patient does not have insomnia.       Allergies  Allergen Reactions   Sulfacetamide Sodium Swelling    Diarrhea,dizziness, tongue swelling   Other Other (See Comments) and Swelling    PT states that anything with the sides effects "may cause rash" she cannot take due to her psoriasis, it will make the patches on her hand so thick she can't use her hands.  Diarrhea,dizziness, tongue swelling   Oxycontin [Oxycodone Hcl] Nausea And Vomiting   Sulfa Antibiotics Swelling    Diarrhea,dizziness, tongue swelling   Abilify [Aripiprazole] Other (See Comments)   Cephalosporins Rash   Erythromycin Other (See Comments)    Abd. pain Abd. pain Other reaction(s): Other (See Comments) Abd. 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    Bipolar disorder (HCC)    Chest pain, unspecified    Chronic low back pain 10/23/2013   Chronic UTI    COPD (chronic obstructive pulmonary disease) (HCC)    Depression    Dysfunction of eustachian tube    Enlargement of lymph nodes    Family history of brain cancer    Family history of breast cancer    Family history of prostate cancer    History of Bell's palsy Right   Injury, other and unspecified, knee, leg, ankle, and foot    MRSA (methicillin resistant staph aureus) culture positive 07/11/2014   abscess   Other specified disease of hair and hair follicles    Pancreatic cancer ([place])    Pancreatic cancer ([place])    Rectal prolapse    Unspecified symptom  associated with female genital organs      Past Surgical History:  Procedure Laterality Date   ABDOMINAL HYSTERECTOMY     bladder tack  2010   CHOLECYSTECTOMY  2002   FOOT SURGERY Bilateral 2004   bunion   GANGLION CYST EXCISION  2002   INCISION AND DRAINAGE ABSCESS  04-16-15   PARTIAL HYSTERECTOMY  2010   uterus removed   PORTA CATH INSERTION N/A 09/11/2020   Procedure: PORTA CATH INSERTION;  Surgeon: Algernon Huxley, MD;  Location: [place] [facility];  Service: Cardiovascular;  Laterality: N/A;    Social History   Socioeconomic History   Marital status: Married    Spouse name: Not on file   Number of children: 3   Years of education: hs   Highest education level: Some college, no degree  Occupational History   Not on file  Tobacco Use   Smoking status: Former    Packs/day: 0.50    Years: 40.00    Pack years: 20.00    Types: Cigarettes    Quit date: 07/14/2020    Years since quitting: 0.9   Smokeless tobacco: Never  Vaping Use   Vaping Use: Never used  Substance and Sexual Activity   Alcohol use: No    Alcohol/week: 1.0 standard drink    Types: 1 Glasses of wine per week   Drug use: Yes    Types: Marijuana    Comment: last used 05-20-15   Sexual activity: Yes    Birth control/protection: None  Other Topics Concern   Not on file  Social History Narrative   Patient lives at home with her husband [name]).   Patient works full time.   Education CNA    Right handed.   Caffeine Tea , Soda.   Social Determinants of Health   Financial Resource Strain: Not on file  Food Insecurity: Not on file  Transportation Needs: Not on file  Physical Activity: Not on file  Stress: Not on file  Social Connections: Not on file  Intimate Partner Violence: Not on file    Family History  Problem Relation Age of Onset   Diabetes Father    Cancer Father        brain tumor   Depression Father    Depression Sister    Alcohol abuse Brother    Depression Brother    Leukemia  Maternal Aunt    Prostate cancer Maternal Uncle    Breast cancer Paternal Aunt        dx >50   Heart disease Maternal Grandmother    Heart attack Maternal Grandmother    Stroke Maternal Grandmother    Parkinson's disease Maternal Grandfather    Leukemia Paternal Grandmother    Diabetes Paternal Grandfather    Breast cancer Cousin    Brain cancer Niece        dx under 20     Current Outpatient Medications:    Ascorbic Acid (VITAMIN C) 1000 MG tablet, Take 1,000 mg by mouth daily., Disp: , Rfl:    divalproex (DEPAKOTE) 250 MG DR tablet, Take 1 tablet (250 mg total) by mouth 2 (two) times daily. (Patient taking differently: Take 250 mg by mouth 3 (three) times daily.), Disp: 180 tablet, Rfl: 1   famotidine (PEPCID) 20 MG tablet, Take 1 tablet by mouth 2 (two) times daily., Disp: , Rfl:    ferrous sulfate 325 (65 FE) MG tablet, Take 325 mg by mouth 2 (two) times daily with a meal., Disp: , Rfl:    fluticasone (FLONASE) 50 MCG/ACT nasal spray, Place 1 spray into both nostrils daily., Disp: 11.1 mL, Rfl: 2   KLOR-CON M20 20 MEQ tablet, TAKE 1 TABLET BY MOUTH EVERY DAY, Disp: 90 tablet, Rfl: 1   loperamide (IMODIUM A-D) 2 MG tablet, Take 2 at onset of diarrhea, then 1 every 2hrs until 12hr without a BM. May take 2 tab every 4hrs at bedtime. If diarrhea recurs repeat., Disp: 100 tablet, Rfl: 1   loratadine (CLARITIN) 10 MG tablet, Take 10 mg by mouth daily., Disp: , Rfl:    Multiple Vitamin (MULTIVITAMIN) tablet, Take 1 tablet by mouth daily., Disp: , Rfl:    propranolol (INDERAL) 20 MG tablet, Take 1 tablet (20 mg total) by mouth 3 (three) times daily., Disp: 270 tablet, Rfl: 1 No current facility-administered medications for this visit.  Facility-Administered Medications Ordered in Other Visits:    heparin lock flush 100 UNIT/ML injection, , , ,    heparin lock flush 100 unit/mL, 500 Units, Intravenous, Once, Sindy Guadeloupe, MD   sodium chloride flush (NS) 0.9 % injection 10 mL, 10 mL,  Intracatheter, PRN, Sindy Guadeloupe, MD, 10 mL at 11/03/20 1441  sodium chloride flush (NS) 0.9 % injection 10 mL, 10 mL, Intravenous, PRN, Sindy Guadeloupe, MD  Physical exam:  Vitals:   07/07/21 1040  BP: (!) 122/46  Pulse: 62  Resp: 18  Temp: 98.5 F (36.9 C)  TempSrc: Tympanic  SpO2: 100%  Weight: 99 lb 9.6 oz (45.2 kg)   Physical Exam Constitutional:      General: She is not in acute distress. Cardiovascular:     Rate and Rhythm: Normal rate and regular rhythm.     Heart sounds: Normal heart sounds.  Pulmonary:     Effort: Pulmonary effort is normal.     Breath sounds: Normal breath sounds.  Abdominal:     General: Bowel sounds are normal.     Palpations: Abdomen is soft.  Skin:    General: Skin is warm and dry.  Neurological:     Mental Status: She is alert and oriented to person, place, and time.        Latest Ref Rng & Units 07/07/2021   10:30 AM  CMP  Glucose 70 - 99 mg/dL 189    BUN 6 - 20 mg/dL <5    Creatinine 0.44 - 1.00 mg/dL 0.82    Sodium 135 - 145 mmol/L 139    Potassium 3.5 - 5.1 mmol/L 2.7    Chloride 98 - 111 mmol/L 110    CO2 22 - 32 mmol/L 24    Calcium 8.9 - 10.3 mg/dL 7.8    Total Protein 6.5 - 8.1 g/dL 6.2    Total Bilirubin 0.3 - 1.2 mg/dL 0.4    Alkaline Phos 38 - 126 U/L 132    AST 15 - 41 U/L 52    ALT 0 - 44 U/L 26        Latest Ref Rng & Units 07/07/2021   10:30 AM  CBC  WBC 4.0 - 10.5 K/uL 5.1    Hemoglobin 12.0 - 15.0 g/dL 9.3    Hematocrit 36.0 - 46.0 % 28.3    Platelets 150 - 400 K/uL 191     Assessment and plan- Patient is a 61 y.o. female  with stage II pancreatic adenocarcinoma T2 N0 M0 s/p neoadjuvant and adjuvant modified FOLFIRINOX chemotherapy completed in April 2023 .  This is a routine follow-up visit  Despite completing chemotherapy in March 2023 patient still continues to do poorly overall.  Her appetite is poor and her albumin is still low at 2.8.  She has chronic hypokalemia for which she has been receiving IV  fluids and IV potassium.  She has upcoming scans with Dr. Hyman Hopes at [facility] next month.  Patient will receive IV fluids with IV potassium today and then again in 2 weeks in 4 weeks and I will see her back in 4 weeks   Visit Diagnosis 1. Hypokalemia   2. Dehydration      Dr. Randa Evens, MD, MPH [facility] at [facility] [phone] 07/09/2021 2:30 PM

## 2021-07-10 ENCOUNTER — Ambulatory Visit: Payer: BC Managed Care – PPO

## 2021-07-10 ENCOUNTER — Ambulatory Visit: Payer: BC Managed Care – PPO | Admitting: Oncology

## 2021-07-10 ENCOUNTER — Other Ambulatory Visit: Payer: BC Managed Care – PPO

## 2021-07-16 ENCOUNTER — Encounter: Payer: Self-pay | Admitting: Nurse Practitioner

## 2021-07-16 ENCOUNTER — Ambulatory Visit (INDEPENDENT_AMBULATORY_CARE_PROVIDER_SITE_OTHER): Payer: BC Managed Care – PPO | Admitting: Nurse Practitioner

## 2021-07-16 VITALS — BP 107/66 | HR 86 | Ht 60.0 in | Wt 97.0 lb

## 2021-07-16 DIAGNOSIS — J0110 Acute frontal sinusitis, unspecified: Secondary | ICD-10-CM

## 2021-07-16 DIAGNOSIS — J019 Acute sinusitis, unspecified: Secondary | ICD-10-CM | POA: Insufficient documentation

## 2021-07-16 MED ORDER — PREDNISONE 20 MG PO TABS: 20.0000 mg | ORAL_TABLET | Freq: Every day | ORAL | 0 refills | Status: DC

## 2021-07-16 MED ORDER — ALBUTEROL SULFATE HFA 108 (90 BASE) MCG/ACT IN AERS: 2.0000 | INHALATION_SPRAY | Freq: Four times a day (QID) | RESPIRATORY_TRACT | 0 refills | Status: DC | PRN

## 2021-07-16 MED ORDER — AMOXICILLIN 500 MG PO TABS: 500.0000 mg | ORAL_TABLET | Freq: Two times a day (BID) | ORAL | 0 refills | Status: DC

## 2021-07-16 NOTE — Progress Notes (Signed)
Established Patient Office Visit  Subjective:  Patient ID: Sheryl Perez, female    DOB: 1960/09/29  Age: 61 y.o. MRN: [id number]  CC:  Chief Complaint  Patient presents with   Sinusitis    Patient states that she has been having issues with her sinuses x 3 weeks. Patient describes as head feels like its in a barrel. Marland Kitchen      HPI  Sheryl Perez presents for:  Sinusitis This is a new problem. The current episode started 1 to 4 weeks ago. The problem has been gradually worsening since onset. There has been no fever. Her pain is at a severity of 4/10. The pain is mild. Associated symptoms include congestion, coughing, headaches, sinus pressure and sneezing. Pertinent negatives include no ear pain or sore throat. Past treatments include nothing.     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    Bipolar disorder (HCC)    Chest pain, unspecified    Chronic low back pain 10/23/2013   Chronic UTI    COPD (chronic obstructive pulmonary disease) (HCC)    Depression    Dysfunction of eustachian tube    Enlargement of lymph nodes    Family history of brain cancer    Family history of breast cancer    Family history of prostate cancer    History of Bell's palsy Right   Injury, other and unspecified, knee, leg, ankle, and foot    MRSA (methicillin resistant staph aureus) culture positive 07/11/2014   abscess   Other specified disease of hair and hair follicles    Pancreatic cancer ([place])    Pancreatic cancer ([place])    Rectal prolapse    Unspecified symptom associated with female genital organs     Past Surgical History:  Procedure Laterality Date   ABDOMINAL HYSTERECTOMY     bladder tack  2010   CHOLECYSTECTOMY  2002   FOOT SURGERY Bilateral 2004   bunion   GANGLION CYST EXCISION  2002   INCISION AND DRAINAGE ABSCESS  04-16-15   PARTIAL HYSTERECTOMY  2010   uterus removed   PORTA CATH INSERTION N/A 09/11/2020    Procedure: PORTA CATH INSERTION;  Surgeon: Algernon Huxley, MD;  Location: [place] [facility];  Service: Cardiovascular;  Laterality: N/A;    Family History  Problem Relation Age of Onset   Diabetes Father    Cancer Father        brain tumor   Depression Father    Depression Sister    Alcohol abuse Brother    Depression Brother    Leukemia Maternal Aunt    Prostate cancer Maternal Uncle    Breast cancer Paternal Aunt        dx >50   Heart disease Maternal Grandmother    Heart attack Maternal Grandmother    Stroke Maternal Grandmother    Parkinson's disease Maternal Grandfather    Leukemia Paternal Grandmother    Diabetes Paternal Grandfather    Breast cancer Cousin    Brain cancer Niece        dx under 28    Social History   Socioeconomic History   Marital status: Married    Spouse name: Not on file   Number of children: 3   Years of education: hs   Highest education level: Some college, no degree  Occupational History   Not on file  Tobacco Use   Smoking  status: Former    Packs/day: 0.50    Years: 40.00    Total pack years: 20.00    Types: Cigarettes    Quit date: 07/14/2020    Years since quitting: 1.0   Smokeless tobacco: Never  Vaping Use   Vaping Use: Never used  Substance and Sexual Activity   Alcohol use: No    Alcohol/week: 1.0 standard drink of alcohol    Types: 1 Glasses of wine per week   Drug use: Yes    Types: Marijuana    Comment: last used 05-20-15   Sexual activity: Yes    Birth control/protection: None  Other Topics Concern   Not on file  Social History Narrative   Patient lives at home with her husband [name]).   Patient works full time.   Education CNA    Right handed.   Caffeine Tea , Soda.   Social Determinants of Health   Financial Resource Strain: High Risk (01/10/2017)   Overall Financial Resource Strain (CARDIA)    Difficulty of Paying Living Expenses: Hard  Food Insecurity: No Food Insecurity (01/10/2017)   Hunger Vital  Sign    Worried About Running Out of Food in the Last Year: Never true    Ran Out of Food in the Last Year: Never true  Transportation Needs: No Transportation Needs (01/10/2017)   PRAPARE - Hydrologist (Medical): No    Lack of Transportation (Non-Medical): No  Physical Activity: Inactive (01/10/2017)   Exercise Vital Sign    Days of Exercise per Week: 0 days    Minutes of Exercise per Session: 0 min  Stress: Stress Concern Present (01/10/2017)   [place]    Feeling of Stress : Very much  Social Connections: Somewhat Isolated (01/10/2017)   Social Connection and Isolation Panel [NHANES]    Frequency of Communication with Friends and Family: More than three times a week    Frequency of Social Gatherings with Friends and Family: More than three times a week    Attends Religious Services: More than 4 times per year    Active Member of [employer] or Organizations: No    Attends Archivist Meetings: Never    Marital Status: Separated  Intimate Partner Violence: Not At Risk (01/10/2017)   Humiliation, Afraid, Rape, and Kick questionnaire    Fear of Current or Ex-Partner: No    Emotionally Abused: No    Physically Abused: No    Sexually Abused: No     Outpatient Medications Prior to Visit  Medication Sig Dispense Refill   Ascorbic Acid (VITAMIN C) 1000 MG tablet Take 1,000 mg by mouth daily.     famotidine (PEPCID) 20 MG tablet Take 1 tablet by mouth 2 (two) times daily.     ferrous sulfate 325 (65 FE) MG tablet Take 325 mg by mouth 2 (two) times daily with a meal.     fluticasone (FLONASE) 50 MCG/ACT nasal spray Place 1 spray into both nostrils daily. 11.1 mL 2   KLOR-CON M20 20 MEQ tablet TAKE 1 TABLET BY MOUTH EVERY DAY 90 tablet 1   loperamide (IMODIUM A-D) 2 MG tablet Take 2 at onset of diarrhea, then 1 every 2hrs until 12hr without a BM. May take 2 tab every 4hrs at bedtime. If  diarrhea recurs repeat. 100 tablet 1   loratadine (CLARITIN) 10 MG tablet Take 10 mg by mouth daily.     Multiple Vitamin (MULTIVITAMIN)  tablet Take 1 tablet by mouth daily.     divalproex (DEPAKOTE) 250 MG DR tablet Take 1 tablet (250 mg total) by mouth 2 (two) times daily. (Patient taking differently: Take 250 mg by mouth 3 (three) times daily.) 180 tablet 1   propranolol (INDERAL) 20 MG tablet Take 1 tablet (20 mg total) by mouth 3 (three) times daily. 270 tablet 1   Facility-Administered Medications Prior to Visit  Medication Dose Route Frequency Provider Last Rate Last Admin   heparin lock flush 100 UNIT/ML injection            heparin lock flush 100 unit/mL  500 Units Intravenous Once Sindy Guadeloupe, MD       sodium chloride flush (NS) 0.9 % injection 10 mL  10 mL Intracatheter PRN Sindy Guadeloupe, MD   10 mL at 11/03/20 1441   sodium chloride flush (NS) 0.9 % injection 10 mL  10 mL Intravenous PRN Sindy Guadeloupe, MD        Allergies  Allergen Reactions   Sulfacetamide Sodium Swelling    Diarrhea,dizziness, tongue swelling   Other Other (See Comments) and Swelling    PT states that anything with the sides effects "may cause rash" she cannot take due to her psoriasis, it will make the patches on her hand so thick she can't use her hands.  Diarrhea,dizziness, tongue swelling   Oxycontin [Oxycodone Hcl] Nausea And Vomiting   Sulfa Antibiotics Swelling    Diarrhea,dizziness, tongue swelling   Abilify [Aripiprazole] Other (See Comments)   Cephalosporins Rash   Erythromycin Other (See Comments)    Abd. pain Abd. pain Other reaction(s): Other (See Comments) Abd. pain   Lamotrigine Rash and Other (See Comments)    psorasis worsen psorasis worsen   Risperidone Other (See Comments)    tremors tremors tremors    ROS Review of Systems  Constitutional:  Negative for activity change and appetite change.  HENT:  Positive for congestion, sinus pressure and sneezing. Negative for ear  pain and sore throat.   Eyes:  Negative for pain and discharge.  Respiratory:  Positive for cough. Negative for apnea and chest tightness.   Cardiovascular:  Negative for chest pain and palpitations.  Gastrointestinal:  Negative for abdominal distention and anal bleeding.  Genitourinary:  Negative for difficulty urinating and frequency.  Musculoskeletal:  Negative for arthralgias, back pain and gait problem.  Skin:  Negative for color change and pallor.  Neurological:  Positive for headaches. Negative for dizziness.  Psychiatric/Behavioral:  Negative for agitation, behavioral problems and confusion.       Objective:    Physical Exam Constitutional:      Appearance: Normal appearance. She is normal weight.  HENT:     Head: Normocephalic.     Right Ear: Tympanic membrane normal.     Left Ear: Tympanic membrane normal.     Nose:     Right Sinus: Frontal sinus tenderness present.     Left Sinus: Frontal sinus tenderness present.     Mouth/Throat:     Mouth: Mucous membranes are moist.     Pharynx: Oropharynx is clear.  Eyes:     Extraocular Movements: Extraocular movements intact.     Conjunctiva/sclera: Conjunctivae normal.     Pupils: Pupils are equal, round, and reactive to light.  Cardiovascular:     Rate and Rhythm: Normal rate and regular rhythm.     Pulses: Normal pulses.     Heart sounds: Normal heart sounds.  Pulmonary:  Effort: Pulmonary effort is normal.     Breath sounds: Normal breath sounds.  Abdominal:     General: Abdomen is flat. Bowel sounds are normal.     Palpations: Abdomen is soft.  Musculoskeletal:        General: Normal range of motion.     Cervical back: Normal range of motion.  Skin:    General: Skin is warm.     Capillary Refill: Capillary refill takes less than 2 seconds.  Neurological:     General: No focal deficit present.     Mental Status: She is alert and oriented to person, place, and time. Mental status is at baseline.  Psychiatric:         Mood and Affect: Mood normal.        Behavior: Behavior normal.        Thought Content: Thought content normal.        Judgment: Judgment normal.     BP 107/66   Pulse 86   Ht 5' (1.524 m)   Wt 97 lb (44 kg)   LMP 02/09/2008   BMI 18.94 kg/m  Wt Readings from Last 3 Encounters:  07/16/21 97 lb (44 kg)  07/07/21 99 lb 9.6 oz (45.2 kg)  06/16/21 106 lb 4 oz (48.2 kg)     Health Maintenance Due  Topic Date Due   COVID-19 Vaccine (1) Never done   Zoster Vaccines- Shingrix (1 of 2) Never done   PAP SMEAR-Modifier  11/01/2016    There are no preventive care reminders to display for this patient.  Lab Results  Component Value Date   TSH 1.241 06/16/2021   Lab Results  Component Value Date   WBC 5.1 07/07/2021   HGB 9.3 (L) 07/07/2021   HCT 28.3 (L) 07/07/2021   MCV 101.8 (H) 07/07/2021   PLT 191 07/07/2021   Lab Results  Component Value Date   NA 139 07/07/2021   K 2.7 (LL) 07/07/2021   CO2 24 07/07/2021   GLUCOSE 189 (H) 07/07/2021   BUN <5 (L) 07/07/2021   CREATININE 0.82 07/07/2021   BILITOT 0.4 07/07/2021   ALKPHOS 132 (H) 07/07/2021   AST 52 (H) 07/07/2021   ALT 26 07/07/2021   PROT 6.2 (L) 07/07/2021   ALBUMIN 2.8 (L) 07/07/2021   CALCIUM 7.8 (L) 07/07/2021   ANIONGAP 5 07/07/2021   Lab Results  Component Value Date   CHOL 197 12/12/2019   Lab Results  Component Value Date   HDL 51 12/12/2019   Lab Results  Component Value Date   LDLCALC 123 (H) 12/12/2019   Lab Results  Component Value Date   TRIG 68 08/04/2020   Lab Results  Component Value Date   CHOLHDL 3.9 12/12/2019   Lab Results  Component Value Date   HGBA1C 5.3 08/24/2018      Assessment & Plan:   Problem List Items Addressed This Visit       Respiratory   Acute frontal sinusitis - Primary    Started her on  Amoxicillin 500 mg twice a day for 10 days and prednisone 20 mg for 5 days. Advised patient to use humidifier and increase fluid intake.      Relevant  Medications   predniSONE (DELTASONE) 20 MG tablet   amoxicillin (AMOXIL) 500 MG tablet     Meds ordered this encounter  Medications   predniSONE (DELTASONE) 20 MG tablet    Sig: Take 1 tablet (20 mg total) by mouth daily with breakfast.  Dispense:  5 tablet    Refill:  0   albuterol (VENTOLIN HFA) 108 (90 Base) MCG/ACT inhaler    Sig: Inhale 2 puffs into the lungs every 6 (six) hours as needed for wheezing or shortness of breath.    Dispense:  8 g    Refill:  0   amoxicillin (AMOXIL) 500 MG tablet    Sig: Take 1 tablet (500 mg total) by mouth 2 (two) times daily.    Dispense:  20 tablet    Refill:  0     Follow-up: Return if symptoms worsen or fail to improve.    Theresia Lo, NP

## 2021-07-17 ENCOUNTER — Encounter: Payer: Self-pay | Admitting: Psychiatry

## 2021-07-17 ENCOUNTER — Ambulatory Visit: Payer: BC Managed Care – PPO | Admitting: Psychiatry

## 2021-07-17 VITALS — BP 130/81 | HR 102 | Temp 97.8°F | Wt 96.0 lb

## 2021-07-17 DIAGNOSIS — F1221 Cannabis dependence, in remission: Secondary | ICD-10-CM

## 2021-07-17 DIAGNOSIS — F411 Generalized anxiety disorder: Secondary | ICD-10-CM | POA: Diagnosis not present

## 2021-07-17 DIAGNOSIS — F17200 Nicotine dependence, unspecified, uncomplicated: Secondary | ICD-10-CM | POA: Diagnosis not present

## 2021-07-17 DIAGNOSIS — F603 Borderline personality disorder: Secondary | ICD-10-CM

## 2021-07-17 DIAGNOSIS — F3178 Bipolar disorder, in full remission, most recent episode mixed: Secondary | ICD-10-CM

## 2021-07-17 MED ORDER — DIVALPROEX SODIUM 250 MG PO DR TAB: 250.0000 mg | DELAYED_RELEASE_TABLET | Freq: Two times a day (BID) | ORAL | 1 refills | Status: DC

## 2021-07-17 MED ORDER — PROPRANOLOL HCL 20 MG PO TABS: 20.0000 mg | ORAL_TABLET | Freq: Three times a day (TID) | ORAL | 1 refills | Status: DC

## 2021-07-17 NOTE — Progress Notes (Signed)
[facility] MD OP Progress Note  07/17/2021 11:04 AM Sheryl Perez  MRN:  [id number]  Chief Complaint:  Chief Complaint  Patient presents with   Follow-up: 61 year old Caucasian female who has a history of bipolar disorder, borderline personality disorder, cannabis use disorder in remission, anxiety disorder, recent diagnosis of pancreatic adenocarcinoma currently undergoing treatment, presented for medication management.   HPI: Sheryl Perez is a 61 year old Caucasian female who has a history of bipolar disorder, borderline personality disorder, cannabis use disorder in remission, GAD, married, lives in [place], was evaluated in office today.  Patient with history of pancreatic adenocarcinoma, patient is status post definitive Whipple surgery, completed adjuvant chemotherapy in March 2023.  Per review of notes per her oncologist-Dr.Rao,-Patient with residual cancer showing evident tumor regression-partial response.  1 out of 22 lymph nodes positive for malignancy.  Patient today appeared to be alert, oriented to person place time situation.  She appeared to be in a good mood.  Happy with her progress with the cancer.  Reports she is awaiting a follow-up appointment with her providers to discuss next steps in her cancer treatment.  She reports her appetite as fair.  She may have lost several pounds in the past several months.  However currently eating better.  Patient reports mood wise she is doing well.  Denies any significant mood swings.  Continues to be on the Depakote.  Denies side effects.  Reports sleep is good.  She quit smoking cigarettes almost a year ago.  She continues to have good support system from her family.  Denies any suicidality, homicidality or perceptual disturbances.  Patient denies any other concerns today.  Visit Diagnosis:    ICD-10-CM   1. Bipolar disorder, in full remission, most recent episode mixed (HCC)  F31.78 divalproex (DEPAKOTE) 250 MG DR tablet     propranolol (INDERAL) 20 MG tablet    2. GAD (generalized anxiety disorder)  F41.1 divalproex (DEPAKOTE) 250 MG DR tablet    3. Tobacco use disorder  F17.200     4. Borderline personality disorder ([place])  F60.3     5. Cannabis use disorder, moderate, in early remission (Sheryl Perez)  F12.21       Past Psychiatric History: Reviewed past psychiatric history from progress note on 04/12/2017.  Past trials of risperidone, Abilify, Lamictal, Prozac, trazodone, lithium.  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    Bipolar disorder ([place])    Chest pain, unspecified    Chronic low back pain 10/23/2013   Chronic UTI    COPD (chronic obstructive pulmonary disease) (HCC)    Depression    Dysfunction of eustachian tube    Enlargement of lymph nodes    Family history of brain cancer    Family history of breast cancer    Family history of prostate cancer    History of Bell's palsy Right   Injury, other and unspecified, knee, leg, ankle, and foot    MRSA (methicillin resistant staph aureus) culture positive 07/11/2014   abscess   Other specified disease of hair and hair follicles    Pancreatic cancer ([place])    Pancreatic cancer ([place])    Rectal prolapse    Unspecified symptom associated with female genital organs     Past Surgical History:  Procedure Laterality Date   ABDOMINAL HYSTERECTOMY     bladder tack  2010   CHOLECYSTECTOMY  2002   FOOT  SURGERY Bilateral 2004   bunion   GANGLION CYST EXCISION  2002   INCISION AND DRAINAGE ABSCESS  04-16-15   PARTIAL HYSTERECTOMY  2010   uterus removed   PORTA CATH INSERTION N/A 09/11/2020   Procedure: PORTA CATH INSERTION;  Surgeon: Algernon Huxley, MD;  Location: [facility];  Service: Cardiovascular;  Laterality: N/A;    Family Psychiatric History: Reviewed family psychiatric history from progress note on 04/12/2017.  Family History:  Family History  Problem  Relation Age of Onset   Diabetes Father    Cancer Father        brain tumor   Depression Father    Depression Sister    Alcohol abuse Brother    Depression Brother    Leukemia Maternal Aunt    Prostate cancer Maternal Uncle    Breast cancer Paternal Aunt        dx >50   Heart disease Maternal Grandmother    Heart attack Maternal Grandmother    Stroke Maternal Grandmother    Parkinson's disease Maternal Grandfather    Leukemia Paternal Grandmother    Diabetes Paternal Grandfather    Breast cancer Cousin    Brain cancer Niece        dx under 92    Social History: Reviewed social history from progress note on 04/12/2017. Social History   Socioeconomic History   Marital status: Married    Spouse name: Not on file   Number of children: 3   Years of education: hs   Highest education level: Some college, no degree  Occupational History   Not on file  Tobacco Use   Smoking status: Former    Packs/day: 0.50    Years: 40.00    Total pack years: 20.00    Types: Cigarettes    Quit date: 07/14/2020    Years since quitting: 1.0   Smokeless tobacco: Never  Vaping Use   Vaping Use: Never used  Substance and Sexual Activity   Alcohol use: No    Alcohol/week: 1.0 standard drink of alcohol    Types: 1 Glasses of wine per week   Drug use: Yes    Types: Marijuana    Comment: last used 05-20-15   Sexual activity: Yes    Birth control/protection: None  Other Topics Concern   Not on file  Social History Narrative   Patient lives at home with her husband [name]).   Patient works full time.   Education CNA    Right handed.   Caffeine Tea , Soda.   Social Determinants of Health   Financial Resource Strain: High Risk (01/10/2017)   Overall Financial Resource Strain (CARDIA)    Difficulty of Paying Living Expenses: Hard  Food Insecurity: No Food Insecurity (01/10/2017)   Hunger Vital Sign    Worried About Running Out of Food in the Last Year: Never true    Ran Out of Food in the  Last Year: Never true  Transportation Needs: No Transportation Needs (01/10/2017)   PRAPARE - Hydrologist (Medical): No    Lack of Transportation (Non-Medical): No  Physical Activity: Inactive (01/10/2017)   Exercise Vital Sign    Days of Exercise per Week: 0 days    Minutes of Exercise per Session: 0 min  Stress: Stress Concern Present (01/10/2017)   [place]    Feeling of Stress : Very much  Social Connections: Somewhat Isolated (01/10/2017)   Social  Connection and Isolation Panel [NHANES]    Frequency of Communication with Friends and Family: More than three times a week    Frequency of Social Gatherings with Friends and Family: More than three times a week    Attends Religious Services: More than 4 times per year    Active Member of [employer] or Organizations: No    Attends Archivist Meetings: Never    Marital Status: Separated    Allergies:  Allergies  Allergen Reactions   Sulfacetamide Sodium Swelling    Diarrhea,dizziness, tongue swelling   Other Other (See Comments) and Swelling    PT states that anything with the sides effects "may cause rash" she cannot take due to her psoriasis, it will make the patches on her hand so thick she can't use her hands.  Diarrhea,dizziness, tongue swelling   Oxycontin [Oxycodone Hcl] Nausea And Vomiting   Sulfa Antibiotics Swelling    Diarrhea,dizziness, tongue swelling   Abilify [Aripiprazole] Other (See Comments)   Cephalosporins Rash   Erythromycin Other (See Comments)    Abd. pain Abd. pain Other reaction(s): Other (See Comments) Abd. pain   Lamotrigine Rash and Other (See Comments)    psorasis worsen psorasis worsen   Risperidone Other (See Comments)    tremors tremors tremors    Metabolic Disorder Labs: Lab Results  Component Value Date   HGBA1C 5.3 08/24/2018   MPG 105.41 08/24/2018   MPG 105.41 04/14/2017   Lab  Results  Component Value Date   PROLACTIN 9.3 08/24/2018   PROLACTIN 12.5 04/14/2017   Lab Results  Component Value Date   CHOL 197 12/12/2019   TRIG 68 08/04/2020   HDL 51 12/12/2019   CHOLHDL 3.9 12/12/2019   VLDL 37 08/24/2018   LDLCALC 123 (H) 12/12/2019   LDLCALC 107 (H) 08/24/2018   Lab Results  Component Value Date   TSH 1.241 06/16/2021   TSH 1.27 12/12/2019    Therapeutic Level Labs: Lab Results  Component Value Date   LITHIUM 0.34 (L) 04/14/2017   LITHIUM 0.3 (L) 06/15/2016   Lab Results  Component Value Date   VALPROATE 25 (L) 11/19/2020   VALPROATE 17 (L) 02/22/2020   No results found for: "CBMZ"  Current Medications: Current Outpatient Medications  Medication Sig Dispense Refill   albuterol (VENTOLIN HFA) 108 (90 Base) MCG/ACT inhaler Inhale 2 puffs into the lungs every 6 (six) hours as needed for wheezing or shortness of breath. 8 g 0   amoxicillin (AMOXIL) 500 MG tablet Take 1 tablet (500 mg total) by mouth 2 (two) times daily. 20 tablet 0   Ascorbic Acid (VITAMIN C) 1000 MG tablet Take 1,000 mg by mouth daily.     famotidine (PEPCID) 20 MG tablet Take 1 tablet by mouth 2 (two) times daily.     ferrous sulfate 325 (65 FE) MG tablet Take 325 mg by mouth 2 (two) times daily with a meal.     KLOR-CON M20 20 MEQ tablet TAKE 1 TABLET BY MOUTH EVERY DAY 90 tablet 1   loperamide (IMODIUM A-D) 2 MG tablet Take 2 at onset of diarrhea, then 1 every 2hrs until 12hr without a BM. May take 2 tab every 4hrs at bedtime. If diarrhea recurs repeat. 100 tablet 1   loratadine (CLARITIN) 10 MG tablet Take 10 mg by mouth daily.     Multiple Vitamin (MULTIVITAMIN) tablet Take 1 tablet by mouth daily.     predniSONE (DELTASONE) 20 MG tablet Take 1 tablet (20 mg total) by  mouth daily with breakfast. 5 tablet 0   divalproex (DEPAKOTE) 250 MG DR tablet Take 1 tablet (250 mg total) by mouth 2 (two) times daily. 180 tablet 1   fluticasone (FLONASE) 50 MCG/ACT nasal spray Place 1  spray into both nostrils daily. 11.1 mL 2   propranolol (INDERAL) 20 MG tablet Take 1 tablet (20 mg total) by mouth 3 (three) times daily. 270 tablet 1   No current facility-administered medications for this visit.   Facility-Administered Medications Ordered in Other Visits  Medication Dose Route Frequency Provider Last Rate Last Admin   heparin lock flush 100 UNIT/ML injection            heparin lock flush 100 unit/mL  500 Units Intravenous Once Sindy Guadeloupe, MD       sodium chloride flush (NS) 0.9 % injection 10 mL  10 mL Intracatheter PRN Sindy Guadeloupe, MD   10 mL at 11/03/20 1441   sodium chloride flush (NS) 0.9 % injection 10 mL  10 mL Intravenous PRN Sindy Guadeloupe, MD         Musculoskeletal: Strength & Muscle Tone: within normal limits Gait & Station: normal Patient leans: N/A  Psychiatric Specialty Exam: Review of Systems  Psychiatric/Behavioral:  The patient is nervous/anxious.   All other systems reviewed and are negative.   Blood pressure 130/81, pulse (!) 102, temperature 97.8 F (36.6 C), temperature source Temporal, weight 96 lb (43.5 kg), last menstrual period 02/09/2008.Body mass index is 18.75 kg/m.  General Appearance: Casual  Eye Contact:  Fair  Speech:  Clear and Coherent  Volume:  Normal  Mood:  Anxious coping OK  Affect:  Congruent  Thought Process:  Goal Directed and Descriptions of Associations: Intact  Orientation:  Full (Time, Place, and Person)  Thought Content: Logical   Suicidal Thoughts:  No  Homicidal Thoughts:  No  Memory:  Immediate;   Fair Recent;   Fair Remote;   Fair  Judgement:  Fair  Insight:  Fair  Psychomotor Activity:  Normal  Concentration:  Concentration: Fair and Attention Span: Fair  Recall:  [employer] of Knowledge: Fair  Language: Fair  Akathisia:  No  Handed:  Right  AIMS (if indicated): done  Assets:  Communication Skills Desire for Improvement Housing Social Support  ADL's:  Intact  Cognition: WNL  Sleep:   Fair   Screenings: [place] Office Visit from 07/17/2021 in [facility] Video Visit from 04/21/2020 in [facility] Office Visit from 05/03/2016 in [facility] Admission (Discharged) from 01/15/2015 in [facility] Total Score 0 0 0 0      AUDIT    Flowsheet Row Admission (Discharged) from 01/15/2015 in [facility]  Alcohol Use Disorder Identification Test Final Score (AUDIT) 1      GAD-7    Flowsheet Row Video Visit from 09/08/2020 in [facility] Office Visit from 08/19/2020 in [facility] Office Visit from 04/18/2015 in [facility]  Total GAD-7 Score '6 2 14      '$ PHQ2-9    [place] Visit from 07/17/2021 in [facility] Office Visit from 07/16/2021 in [facility] Office Visit from 12/23/2020 in [facility] Office Visit from 11/25/2020 in [facility] Video Visit from 10/22/2020 in [facility]  PHQ-2 Total Score 1 0 0 1 0  PHQ-9 Total Score 4 0 -- 3 2      Flowsheet Row Office Visit from 07/17/2021 in [facility] Office Visit from 11/25/2020 in [facility] Video Visit from 09/08/2020 in [facility] No Risk No Risk No Risk        Assessment and Plan: Sheryl Perez is a 61 year old Caucasian female who has a history of bipolar disorder, cannabis use disorder, tobacco use disorder, psoriatic arthritis, pancreatic adenocarcinoma was evaluated in office today.  Patient currently undergoing treatment for her adenocarcinoma of the pancreas, currently reports mood symptoms are stable.  Plan as noted below.  Plan Bipolar disorder in  remission Depakote 250 mg p.o. twice daily Depakote level 11/19/2020-25-subtherapeutic. We will consider repeating Depakote level at her next visit. Patient wants to stay on this dosage at this time. Patient will also need repeat LFTs, sodium level-patient reports she gets labs drawn on a regular basis per her oncologist.  GAD-stable Depakote 250 mg p.o. twice daily Propranolol 20 mg 3 times daily.  Cannabis use disorder in remission Will monitor closely   High risk medication use-will consider getting repeat labs-Depakote at her future visit. Reviewed and discussed most recent LFTs dated 07/07/2021-AST elevated at 86, alkaline phosphatase elevated at 132-likely due to her current chemotherapy-currently being monitored by oncologist.  She is due for another set of labs next week.  Patient will talk to her primary oncologist regarding the same.  Reviewed notes per Dr. Blaine Hamper 07/07/2021 as noted above.  Follow-up in clinic in 3 months or sooner if needed.  This note was generated in part or whole with voice recognition software. Voice recognition is usually quite accurate but there are transcription errors that can and very often do occur. I apologize for any typographical errors that were not detected and corrected.      Ursula Alert, MD 07/17/2021, 11:04 AM

## 2021-07-17 NOTE — Assessment & Plan Note (Signed)
Started her on  Amoxicillin 500 mg twice a day for 10 days and prednisone 20 mg for 5 days. Advised patient to use humidifier and increase fluid intake.

## 2021-07-20 ENCOUNTER — Ambulatory Visit: Payer: BC Managed Care – PPO

## 2021-07-21 ENCOUNTER — Inpatient Hospital Stay: Payer: BC Managed Care – PPO

## 2021-07-21 ENCOUNTER — Inpatient Hospital Stay: Payer: BC Managed Care – PPO | Attending: Oncology

## 2021-07-21 ENCOUNTER — Encounter: Payer: Self-pay | Admitting: Oncology

## 2021-07-21 VITALS — BP 114/52 | HR 65 | Temp 97.3°F

## 2021-07-21 DIAGNOSIS — Z888 Allergy status to other drugs, medicaments and biological substances status: Secondary | ICD-10-CM | POA: Insufficient documentation

## 2021-07-21 DIAGNOSIS — E860 Dehydration: Secondary | ICD-10-CM

## 2021-07-21 DIAGNOSIS — Z87891 Personal history of nicotine dependence: Secondary | ICD-10-CM | POA: Diagnosis not present

## 2021-07-21 DIAGNOSIS — Z833 Family history of diabetes mellitus: Secondary | ICD-10-CM | POA: Insufficient documentation

## 2021-07-21 DIAGNOSIS — Z808 Family history of malignant neoplasm of other organs or systems: Secondary | ICD-10-CM | POA: Insufficient documentation

## 2021-07-21 DIAGNOSIS — Z87440 Personal history of urinary (tract) infections: Secondary | ICD-10-CM | POA: Diagnosis not present

## 2021-07-21 DIAGNOSIS — C253 Malignant neoplasm of pancreatic duct: Secondary | ICD-10-CM | POA: Insufficient documentation

## 2021-07-21 DIAGNOSIS — Z8042 Family history of malignant neoplasm of prostate: Secondary | ICD-10-CM | POA: Diagnosis not present

## 2021-07-21 DIAGNOSIS — E876 Hypokalemia: Secondary | ICD-10-CM

## 2021-07-21 DIAGNOSIS — R1013 Epigastric pain: Secondary | ICD-10-CM | POA: Diagnosis not present

## 2021-07-21 DIAGNOSIS — Z803 Family history of malignant neoplasm of breast: Secondary | ICD-10-CM | POA: Insufficient documentation

## 2021-07-21 DIAGNOSIS — Z5111 Encounter for antineoplastic chemotherapy: Secondary | ICD-10-CM | POA: Diagnosis present

## 2021-07-21 DIAGNOSIS — R5383 Other fatigue: Secondary | ICD-10-CM | POA: Insufficient documentation

## 2021-07-21 DIAGNOSIS — F319 Bipolar disorder, unspecified: Secondary | ICD-10-CM | POA: Diagnosis not present

## 2021-07-21 DIAGNOSIS — Z806 Family history of leukemia: Secondary | ICD-10-CM | POA: Insufficient documentation

## 2021-07-21 DIAGNOSIS — R1012 Left upper quadrant pain: Secondary | ICD-10-CM | POA: Diagnosis not present

## 2021-07-21 DIAGNOSIS — F1290 Cannabis use, unspecified, uncomplicated: Secondary | ICD-10-CM | POA: Insufficient documentation

## 2021-07-21 DIAGNOSIS — Z79899 Other long term (current) drug therapy: Secondary | ICD-10-CM | POA: Diagnosis not present

## 2021-07-21 DIAGNOSIS — C259 Malignant neoplasm of pancreas, unspecified: Secondary | ICD-10-CM

## 2021-07-21 DIAGNOSIS — Z8249 Family history of ischemic heart disease and other diseases of the circulatory system: Secondary | ICD-10-CM | POA: Insufficient documentation

## 2021-07-21 DIAGNOSIS — Z885 Allergy status to narcotic agent status: Secondary | ICD-10-CM | POA: Diagnosis not present

## 2021-07-21 DIAGNOSIS — Z9049 Acquired absence of other specified parts of digestive tract: Secondary | ICD-10-CM | POA: Diagnosis not present

## 2021-07-21 DIAGNOSIS — K589 Irritable bowel syndrome without diarrhea: Secondary | ICD-10-CM | POA: Insufficient documentation

## 2021-07-21 DIAGNOSIS — Z811 Family history of alcohol abuse and dependence: Secondary | ICD-10-CM | POA: Diagnosis not present

## 2021-07-21 DIAGNOSIS — Z818 Family history of other mental and behavioral disorders: Secondary | ICD-10-CM | POA: Diagnosis not present

## 2021-07-21 DIAGNOSIS — Z882 Allergy status to sulfonamides status: Secondary | ICD-10-CM | POA: Insufficient documentation

## 2021-07-21 DIAGNOSIS — R220 Localized swelling, mass and lump, head: Secondary | ICD-10-CM | POA: Diagnosis not present

## 2021-07-21 DIAGNOSIS — Z881 Allergy status to other antibiotic agents status: Secondary | ICD-10-CM | POA: Insufficient documentation

## 2021-07-21 DIAGNOSIS — Z7952 Long term (current) use of systemic steroids: Secondary | ICD-10-CM | POA: Insufficient documentation

## 2021-07-21 DIAGNOSIS — D539 Nutritional anemia, unspecified: Secondary | ICD-10-CM

## 2021-07-21 DIAGNOSIS — Z823 Family history of stroke: Secondary | ICD-10-CM | POA: Insufficient documentation

## 2021-07-21 LAB — CBC WITH DIFFERENTIAL/PLATELET
Abs Immature Granulocytes: 0.02 10*3/uL (ref 0.00–0.07)
Basophils Absolute: 0 10*3/uL (ref 0.0–0.1)
Basophils Relative: 0 %
Eosinophils Absolute: 0.1 10*3/uL (ref 0.0–0.5)
Eosinophils Relative: 1 %
HCT: 28.3 % — ABNORMAL LOW (ref 36.0–46.0)
Hemoglobin: 9.2 g/dL — ABNORMAL LOW (ref 12.0–15.0)
Immature Granulocytes: 0 %
Lymphocytes Relative: 48 %
Lymphs Abs: 3 10*3/uL (ref 0.7–4.0)
MCH: 33.9 pg (ref 26.0–34.0)
MCHC: 32.5 g/dL (ref 30.0–36.0)
MCV: 104.4 fL — ABNORMAL HIGH (ref 80.0–100.0)
Monocytes Absolute: 0.5 10*3/uL (ref 0.1–1.0)
Monocytes Relative: 8 %
Neutro Abs: 2.8 10*3/uL (ref 1.7–7.7)
Neutrophils Relative %: 43 %
Platelets: 220 10*3/uL (ref 150–400)
RBC: 2.71 MIL/uL — ABNORMAL LOW (ref 3.87–5.11)
RDW: 13.1 % (ref 11.5–15.5)
WBC: 6.4 10*3/uL (ref 4.0–10.5)
nRBC: 0 % (ref 0.0–0.2)

## 2021-07-21 LAB — BASIC METABOLIC PANEL
Anion gap: 6 (ref 5–15)
BUN: 9 mg/dL (ref 6–20)
CO2: 21 mmol/L — ABNORMAL LOW (ref 22–32)
Calcium: 8.1 mg/dL — ABNORMAL LOW (ref 8.9–10.3)
Chloride: 113 mmol/L — ABNORMAL HIGH (ref 98–111)
Creatinine, Ser: 0.8 mg/dL (ref 0.44–1.00)
GFR, Estimated: >60 mL/min (ref >60)
Glucose, Bld: 86 mg/dL (ref 70–99)
Potassium: 3.6 mmol/L (ref 3.5–5.1)
Sodium: 140 mmol/L (ref 135–145)

## 2021-07-21 MED ORDER — SODIUM CHLORIDE 0.9% FLUSH
10.0000 mL | Freq: Once | INTRAVENOUS | Status: AC | PRN
  Administered 2021-07-21: 10 mL
  Filled 2021-07-21: qty 10

## 2021-07-21 MED ORDER — SODIUM CHLORIDE 0.9 % IV SOLN
Freq: Once | INTRAVENOUS | Status: AC
  Filled 2021-07-21: qty 250

## 2021-07-21 MED ORDER — HEPARIN SOD (PORK) LOCK FLUSH 100 UNIT/ML IV SOLN
500.0000 [IU] | Freq: Once | INTRAVENOUS | Status: AC | PRN
  Administered 2021-07-21: 500 [IU]
  Filled 2021-07-21: qty 5

## 2021-07-21 MED ORDER — CYANOCOBALAMIN 1000 MCG/ML IJ SOLN
1000.0000 ug | Freq: Once | INTRAMUSCULAR | Status: AC
  Administered 2021-07-21: 1000 ug via INTRAMUSCULAR
  Filled 2021-07-21: qty 1

## 2021-07-31 DIAGNOSIS — Z9049 Acquired absence of other specified parts of digestive tract: Secondary | ICD-10-CM | POA: Diagnosis not present

## 2021-07-31 DIAGNOSIS — C259 Malignant neoplasm of pancreas, unspecified: Secondary | ICD-10-CM | POA: Diagnosis not present

## 2021-08-03 ENCOUNTER — Other Ambulatory Visit: Payer: Self-pay | Admitting: Nurse Practitioner

## 2021-08-04 ENCOUNTER — Encounter: Payer: Self-pay | Admitting: Oncology

## 2021-08-04 ENCOUNTER — Other Ambulatory Visit: Payer: Self-pay | Admitting: Oncology

## 2021-08-04 ENCOUNTER — Inpatient Hospital Stay (HOSPITAL_BASED_OUTPATIENT_CLINIC_OR_DEPARTMENT_OTHER): Payer: BC Managed Care – PPO | Admitting: Oncology

## 2021-08-04 ENCOUNTER — Inpatient Hospital Stay: Payer: BC Managed Care – PPO

## 2021-08-04 VITALS — BP 89/54 | HR 66 | Resp 18 | Wt 97.0 lb

## 2021-08-04 DIAGNOSIS — Z888 Allergy status to other drugs, medicaments and biological substances status: Secondary | ICD-10-CM | POA: Diagnosis not present

## 2021-08-04 DIAGNOSIS — Z79899 Other long term (current) drug therapy: Secondary | ICD-10-CM | POA: Diagnosis not present

## 2021-08-04 DIAGNOSIS — E8809 Other disorders of plasma-protein metabolism, not elsewhere classified: Secondary | ICD-10-CM | POA: Diagnosis not present

## 2021-08-04 DIAGNOSIS — C253 Malignant neoplasm of pancreatic duct: Secondary | ICD-10-CM | POA: Diagnosis not present

## 2021-08-04 DIAGNOSIS — E860 Dehydration: Secondary | ICD-10-CM

## 2021-08-04 DIAGNOSIS — Z8507 Personal history of malignant neoplasm of pancreas: Secondary | ICD-10-CM

## 2021-08-04 DIAGNOSIS — F1290 Cannabis use, unspecified, uncomplicated: Secondary | ICD-10-CM | POA: Diagnosis not present

## 2021-08-04 DIAGNOSIS — Z87440 Personal history of urinary (tract) infections: Secondary | ICD-10-CM | POA: Diagnosis not present

## 2021-08-04 DIAGNOSIS — Z87891 Personal history of nicotine dependence: Secondary | ICD-10-CM | POA: Diagnosis not present

## 2021-08-04 DIAGNOSIS — E440 Moderate protein-calorie malnutrition: Secondary | ICD-10-CM

## 2021-08-04 DIAGNOSIS — R5383 Other fatigue: Secondary | ICD-10-CM | POA: Diagnosis not present

## 2021-08-04 DIAGNOSIS — Z881 Allergy status to other antibiotic agents status: Secondary | ICD-10-CM | POA: Diagnosis not present

## 2021-08-04 DIAGNOSIS — Z882 Allergy status to sulfonamides status: Secondary | ICD-10-CM | POA: Diagnosis not present

## 2021-08-04 DIAGNOSIS — F319 Bipolar disorder, unspecified: Secondary | ICD-10-CM | POA: Diagnosis not present

## 2021-08-04 DIAGNOSIS — R220 Localized swelling, mass and lump, head: Secondary | ICD-10-CM | POA: Diagnosis not present

## 2021-08-04 DIAGNOSIS — Z885 Allergy status to narcotic agent status: Secondary | ICD-10-CM | POA: Diagnosis not present

## 2021-08-04 DIAGNOSIS — C259 Malignant neoplasm of pancreas, unspecified: Secondary | ICD-10-CM

## 2021-08-04 DIAGNOSIS — Z08 Encounter for follow-up examination after completed treatment for malignant neoplasm: Secondary | ICD-10-CM | POA: Diagnosis not present

## 2021-08-04 DIAGNOSIS — Z7952 Long term (current) use of systemic steroids: Secondary | ICD-10-CM | POA: Diagnosis not present

## 2021-08-04 DIAGNOSIS — R1012 Left upper quadrant pain: Secondary | ICD-10-CM | POA: Diagnosis not present

## 2021-08-04 DIAGNOSIS — R1013 Epigastric pain: Secondary | ICD-10-CM | POA: Diagnosis not present

## 2021-08-04 DIAGNOSIS — K589 Irritable bowel syndrome without diarrhea: Secondary | ICD-10-CM | POA: Diagnosis not present

## 2021-08-04 LAB — CBC WITH DIFFERENTIAL/PLATELET
Abs Immature Granulocytes: 0.01 10*3/uL (ref 0.00–0.07)
Basophils Absolute: 0 10*3/uL (ref 0.0–0.1)
Basophils Relative: 1 %
Eosinophils Absolute: 0.1 10*3/uL (ref 0.0–0.5)
Eosinophils Relative: 3 %
HCT: 31.3 % — ABNORMAL LOW (ref 36.0–46.0)
Hemoglobin: 10.2 g/dL — ABNORMAL LOW (ref 12.0–15.0)
Immature Granulocytes: 0 %
Lymphocytes Relative: 32 %
Lymphs Abs: 1.6 10*3/uL (ref 0.7–4.0)
MCH: 33.6 pg (ref 26.0–34.0)
MCHC: 32.6 g/dL (ref 30.0–36.0)
MCV: 103 fL — ABNORMAL HIGH (ref 80.0–100.0)
Monocytes Absolute: 0.4 10*3/uL (ref 0.1–1.0)
Monocytes Relative: 8 %
Neutro Abs: 2.7 10*3/uL (ref 1.7–7.7)
Neutrophils Relative %: 56 %
Platelets: 239 10*3/uL (ref 150–400)
RBC: 3.04 MIL/uL — ABNORMAL LOW (ref 3.87–5.11)
RDW: 13.5 % (ref 11.5–15.5)
WBC: 4.8 10*3/uL (ref 4.0–10.5)
nRBC: 0 % (ref 0.0–0.2)

## 2021-08-04 LAB — COMPREHENSIVE METABOLIC PANEL
ALT: 30 U/L (ref 0–44)
AST: 48 U/L — ABNORMAL HIGH (ref 15–41)
Albumin: 3.2 g/dL — ABNORMAL LOW (ref 3.5–5.0)
Alkaline Phosphatase: 120 U/L (ref 38–126)
Anion gap: 6 (ref 5–15)
BUN: 10 mg/dL (ref 6–20)
CO2: 21 mmol/L — ABNORMAL LOW (ref 22–32)
Calcium: 8.6 mg/dL — ABNORMAL LOW (ref 8.9–10.3)
Chloride: 111 mmol/L (ref 98–111)
Creatinine, Ser: 0.87 mg/dL (ref 0.44–1.00)
GFR, Estimated: >60 mL/min (ref >60)
Glucose, Bld: 130 mg/dL — ABNORMAL HIGH (ref 70–99)
Potassium: 3.7 mmol/L (ref 3.5–5.1)
Sodium: 138 mmol/L (ref 135–145)
Total Bilirubin: 0.4 mg/dL (ref 0.3–1.2)
Total Protein: 6.6 g/dL (ref 6.5–8.1)

## 2021-08-04 MED ORDER — SODIUM CHLORIDE 0.9 % IV SOLN
Freq: Once | INTRAVENOUS | Status: AC
  Filled 2021-08-04: qty 250

## 2021-08-04 MED ORDER — SODIUM CHLORIDE 0.9% FLUSH
10.0000 mL | Freq: Once | INTRAVENOUS | Status: AC | PRN
  Administered 2021-08-04: 10 mL
  Filled 2021-08-04: qty 10

## 2021-08-04 MED ORDER — HEPARIN SOD (PORK) LOCK FLUSH 100 UNIT/ML IV SOLN
500.0000 [IU] | Freq: Once | INTRAVENOUS | Status: AC | PRN
  Administered 2021-08-04: 500 [IU]
  Filled 2021-08-04: qty 5

## 2021-08-04 MED ORDER — PANCRELIPASE (LIP-PROT-AMYL) 36000-114000 UNITS PO CPEP: ORAL_CAPSULE | ORAL | 11 refills | Status: DC

## 2021-08-04 NOTE — Progress Notes (Signed)
Nutrition Follow-up:  Patient with stage I pancreatic cancer.  Patient completed neoadjuvant chemotherapy, s/p whipple on 12/6 at [facility].  Patient completed 11 cycles of fofirnox chemothearpy.  Met with patient today during fluids. Patient reports that her appetite is good.  Ate a bacon,egg and cheese biscuit this am.  Usually has peanut butter nabs for break at work (last month).  Lunch is leftovers or beanie weanies.  Dinner is meat and vegetables.  Has been drinking shake of protein powder mixed with 2% milk 2 times per day.  Says that she had a few sick days but otherwise appetite has been good.  Says that bowel movement is loose, takes imodium about every other day. Says that she has had issues with oily stool but not currently.     Medications: reviewed  Labs: reviewed  Anthropometrics:   Weight 97 lb today  106 lb 4 oz on 5/9 (edema) 110 lb 14.4 oz on 5/2 (edema) 108 lb on 2/10 112 lb on 1/20  NUTRITION DIAGNOSIS: Unintentional weight loss ongoing   INTERVENTION:  Patient may benefit from trial of pancreatic enzymes.  Discussed with Dr Smith Robert and will order.   Patient to continue oral nutrition supplement Continue high calorie, high protein foods for weight maintenance    MONITORING, EVALUATION, GOAL: weight trends, intake   NEXT VISIT: Wednesday, July 12 during fluids  Jennica Tagliaferri B. Freida Busman, RD, LDN Registered Dietitian [phone]

## 2021-08-04 NOTE — Progress Notes (Signed)
Hematology/Oncology Consult note [facility]  Telephone:([phone] Fax:([phone]) [phone]  Patient Care Team: Corky Downs, MD as PCP - General (Internal Medicine) Cherre Huger, DC as Referring Physician (Chiropractic Medicine) Kieth Brightly, MD (General Surgery) Creig Hines, MD as Consulting Physician (Hematology and Oncology)   Name of the patient: Laferne Barzee  [id number]  11-Jul-1960   Date of visit: 08/04/21  Diagnosis- pancreatic adenocarcinoma stage II apT2 N1 M0  Chief complaint/ Reason for visit-routine follow-up of pancreatic cancer  Heme/Onc history: patient is a 61 year old female with a history of irritable bowel disease for which she follows up with [facility].  She has undergone EGD and colonoscopy with them in the past.  More recently patient was complaining of left upper quadrant and epigastric abdominal pain which causes sudden episodes of abdominal spasms and was prescribed as needed tramadol for it.  These episodes of pain are unrelated to food and are dull aching or throbbing.  She has undergone cholecystectomy in the past.  She underwent CT abdomen and pelvis with contrast in the ER on 08/04/2020 Which did not reveal any acute pathology which showed a possible hypoechoic 1.9 cm right hepatic lobe lesion.  This was followed by an MRI which showed no discrete lesion in the liver and the area of concern noted on CT scan was more likely to represent variable fat deposition.  However there was loss of normal T1 signal in the peripheral pancreas and a potential small pancreatic lesion suspicious for early pancreatic adenocarcinoma in the neck of the pancreas.     Patient underwent EUS by Dr. Shana Chute which showed an irregular mass in the pancreatic neck measuring 15 x 11 mm.  FNA showed adenocarcinoma.  Pancreatic duct measured 1.1 mm in the head with abrupt dilatation in the region of the mass to 4.1 mm.  No abnormality noted in the common  bile duct and hepatic duct.  Celiac region showed no significant endosonographic abnormality.  No lymphadenopathy was seen.  CA 19-9 mildly elevated at 35   Patient was seen by Dr. Gwenlyn Perking from pancreaticobiliary surgery at [facility] for surgical opinion.  Although patient has a small lesion involving the pancreatic neck, there was a concern for tumor extension towards the splenic vein and narrowing of portal splenic confluence.  Patient received 3 months of neoadjuvant modified FOLFIRINOX chemotherapyAnd then underwent definitive Whipple surgery at [facility] with Dr. Gwenlyn Perking on 01/13/2021.  Final pathology showed 2.3 cm grade 2 moderately differentiated adenocarcinoma with lymphovascular and perineural invasion.  Treatment effect present with residual cancer showing evident tumor regression but more than single cells or rare small group of cancer cells] partial response.  Margins negative.  1 out of 22 lymph nodes positive for malignancy.   Patient completed adjuvant chemotherapy in March 2023.  She remains in remission    Interval history-she is back to working and is able to lift weights up to 20 pounds.  Appetite is improving and she is also continuing her protein shakes.  Still remains at 97 pounds and has lost 2 pounds in the last 1 month.    ECOG PS- 1 Pain scale- 0  Review of systems- Review of Systems  Constitutional:  Positive for malaise/fatigue and weight loss. Negative for chills and fever.  HENT:  Negative for congestion, ear discharge and nosebleeds.   Eyes:  Negative for blurred vision.  Respiratory:  Negative for cough, hemoptysis, sputum production, shortness of breath and wheezing.   Cardiovascular:  Negative  for chest pain, palpitations, orthopnea and claudication.  Gastrointestinal:  Negative for abdominal pain, blood in stool, constipation, diarrhea, heartburn, melena, nausea and vomiting.  Genitourinary:  Negative for dysuria, flank pain, frequency, hematuria and urgency.   Musculoskeletal:  Negative for back pain, joint pain and myalgias.  Skin:  Negative for rash.  Neurological:  Negative for dizziness, tingling, focal weakness, seizures, weakness and headaches.  Endo/Heme/Allergies:  Does not bruise/bleed easily.  Psychiatric/Behavioral:  Negative for depression and suicidal ideas. The patient does not have insomnia.       Allergies  Allergen Reactions   Sulfacetamide Sodium Swelling    Diarrhea,dizziness, tongue swelling   Other Other (See Comments) and Swelling    PT states that anything with the sides effects "may cause rash" she cannot take due to her psoriasis, it will make the patches on her hand so thick she can't use her hands.  Diarrhea,dizziness, tongue swelling   Oxycontin [Oxycodone Hcl] Nausea And Vomiting   Sulfa Antibiotics Swelling    Diarrhea,dizziness, tongue swelling   Abilify [Aripiprazole] Other (See Comments)   Cephalosporins Rash   Erythromycin Other (See Comments)    Abd. pain Abd. pain Other reaction(s): Other (See Comments) Abd. 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    Bipolar disorder (HCC)    Chest pain, unspecified    Chronic low back pain 10/23/2013   Chronic UTI    COPD (chronic obstructive pulmonary disease) (HCC)    Depression    Dysfunction of eustachian tube    Enlargement of lymph nodes    Family history of brain cancer    Family history of breast cancer    Family history of prostate cancer    History of Bell's palsy Right   Injury, other and unspecified, knee, leg, ankle, and foot    MRSA (methicillin resistant staph aureus) culture positive 07/11/2014   abscess   Other specified disease of hair and hair follicles    Pancreatic cancer (HCC)    Pancreatic cancer (HCC)     Rectal prolapse    Unspecified symptom associated with female genital organs      Past Surgical History:  Procedure Laterality Date   ABDOMINAL HYSTERECTOMY     bladder tack  2010   CHOLECYSTECTOMY  2002   FOOT SURGERY Bilateral 2004   bunion   GANGLION CYST EXCISION  2002   INCISION AND DRAINAGE ABSCESS  04-16-15   PARTIAL HYSTERECTOMY  2010   uterus removed   PORTA CATH INSERTION N/A 09/11/2020   Procedure: PORTA CATH INSERTION;  Surgeon: Annice Needy, MD;  Location: [facility] INVASIVE CV LAB;  Service: Cardiovascular;  Laterality: N/A;    Social History   Socioeconomic History   Marital status: Married    Spouse name: Not on file   Number of children: 3   Years of education: hs   Highest education level: Some college, no degree  Occupational History   Not on file  Tobacco Use   Smoking status: Former    Packs/day: 0.50    Years: 40.00    Total pack years: 20.00    Types: Cigarettes    Quit date: 07/14/2020    Years since quitting: 1.0   Smokeless tobacco: Never  Vaping Use   Vaping Use: Never used  Substance and Sexual Activity   Alcohol use: No    Alcohol/week: 1.0 standard drink of alcohol    Types: 1 Glasses of wine per week   Drug use: Yes    Types: Marijuana    Comment: last used 05-20-15   Sexual activity: Yes    Birth control/protection: None  Other Topics Concern   Not on file  Social History Narrative   Patient lives at home with her husband Greig Castilla).   Patient works full time.   Education CNA    Right handed.   Caffeine Tea , Soda.   Social Determinants of Health   Financial Resource Strain: High Risk (01/10/2017)   Overall Financial Resource Strain (CARDIA)    Difficulty of Paying Living Expenses: Hard  Food Insecurity: No Food Insecurity (01/10/2017)   Hunger Vital Sign    Worried About Running Out of Food in the Last Year: Never true    Ran Out of Food in the Last Year: Never true  Transportation Needs: No Transportation Needs (01/10/2017)    PRAPARE - Administrator, Civil Service (Medical): No    Lack of Transportation (Non-Medical): No  Physical Activity: Inactive (01/10/2017)   Exercise Vital Sign    Days of Exercise per Week: 0 days    Minutes of Exercise per Session: 0 min  Stress: Stress Concern Present (01/10/2017)   [employer] of Occupational Health - Occupational Stress Questionnaire    Feeling of Stress : Very much  Social Connections: Somewhat Isolated (01/10/2017)   Social Connection and Isolation Panel [NHANES]    Frequency of Communication with Friends and Family: More than three times a week    Frequency of Social Gatherings with Friends and Family: More than three times a week    Attends Religious Services: More than 4 times per year    Active Member of [employer] or Organizations: No    Attends Banker Meetings: Never    Marital Status: Separated  Intimate Partner Violence: Not At Risk (01/10/2017)   Humiliation, Afraid, Rape, and Kick questionnaire    Fear of Current or Ex-Partner: No    Emotionally Abused: No    Physically Abused: No    Sexually Abused: No    Family History  Problem Relation Age of Onset   Diabetes Father    Cancer Father        brain tumor   Depression Father    Depression Sister    Alcohol abuse Brother    Depression Brother    Leukemia Maternal Aunt    Prostate cancer Maternal Uncle    Breast cancer Paternal Aunt        dx >50   Heart disease Maternal Grandmother    Heart attack Maternal Grandmother    Stroke Maternal Grandmother    Parkinson's disease Maternal Grandfather    Leukemia Paternal Grandmother    Diabetes Paternal Grandfather    Breast cancer Cousin    Brain cancer Niece        dx under 50     Current Outpatient Medications:    albuterol (VENTOLIN HFA) 108 (90 Base) MCG/ACT inhaler, TAKE 2 PUFFS BY MOUTH EVERY 6 HOURS AS NEEDED FOR WHEEZE OR SHORTNESS OF BREATH, Disp: 8 each, Rfl: 4   Ascorbic Acid (VITAMIN C) 1000 MG tablet,  Take 1,000 mg by mouth daily., Disp: , Rfl:    divalproex (DEPAKOTE) 250 MG DR tablet, Take 1 tablet (250  mg total) by mouth 2 (two) times daily., Disp: 180 tablet, Rfl: 1   famotidine (PEPCID) 20 MG tablet, Take 1 tablet by mouth 2 (two) times daily., Disp: , Rfl:    ferrous sulfate 325 (65 FE) MG tablet, Take 325 mg by mouth 2 (two) times daily with a meal., Disp: , Rfl:    KLOR-CON M20 20 MEQ tablet, TAKE 1 TABLET BY MOUTH EVERY DAY, Disp: 90 tablet, Rfl: 1   lipase/protease/amylase (CREON) 36000 UNITS CPEP capsule, Take 2 capsules (72,000 Units total) by mouth 3 (three) times daily with meals. May also take 1 capsule (36,000 Units total) as needed (with snacks)., Disp: 240 capsule, Rfl: 11   loperamide (IMODIUM A-D) 2 MG tablet, Take 2 at onset of diarrhea, then 1 every 2hrs until 12hr without a BM. May take 2 tab every 4hrs at bedtime. If diarrhea recurs repeat., Disp: 100 tablet, Rfl: 1   loratadine (CLARITIN) 10 MG tablet, Take 10 mg by mouth daily., Disp: , Rfl:    Multiple Vitamin (MULTIVITAMIN) tablet, Take 1 tablet by mouth daily., Disp: , Rfl:    predniSONE (DELTASONE) 20 MG tablet, Take 1 tablet (20 mg total) by mouth daily with breakfast., Disp: 5 tablet, Rfl: 0   propranolol (INDERAL) 20 MG tablet, Take 1 tablet (20 mg total) by mouth 3 (three) times daily., Disp: 270 tablet, Rfl: 1   fluticasone (FLONASE) 50 MCG/ACT nasal spray, Place 1 spray into both nostrils daily. (Patient not taking: Reported on 08/04/2021), Disp: 11.1 mL, Rfl: 2 No current facility-administered medications for this visit.  Facility-Administered Medications Ordered in Other Visits:    heparin lock flush 100 UNIT/ML injection, , , ,    heparin lock flush 100 unit/mL, 500 Units, Intravenous, Once, Creig Hines, MD   sodium chloride flush (NS) 0.9 % injection 10 mL, 10 mL, Intracatheter, PRN, Creig Hines, MD, 10 mL at 11/03/20 1441   sodium chloride flush (NS) 0.9 % injection 10 mL, 10 mL, Intravenous, PRN,  Creig Hines, MD  Physical exam:  Vitals:   08/04/21 0958  BP: (!) 89/54  Pulse: 66  Resp: 18  SpO2: 100%  Weight: 97 lb (44 kg)   Physical Exam Constitutional:      General: She is not in acute distress. Cardiovascular:     Rate and Rhythm: Normal rate and regular rhythm.     Heart sounds: Normal heart sounds.  Pulmonary:     Effort: Pulmonary effort is normal.     Breath sounds: Normal breath sounds.  Abdominal:     General: Bowel sounds are normal.     Palpations: Abdomen is soft.  Skin:    General: Skin is warm and dry.  Neurological:     Mental Status: She is alert and oriented to person, place, and time.         Latest Ref Rng & Units 08/04/2021    9:26 AM  CMP  Glucose 70 - 99 mg/dL 324   BUN 6 - 20 mg/dL 10   Creatinine 4.01 - 1.00 mg/dL 0.27   Sodium 253 - 664 mmol/L 138   Potassium 3.5 - 5.1 mmol/L 3.7   Chloride 98 - 111 mmol/L 111   CO2 22 - 32 mmol/L 21   Calcium 8.9 - 10.3 mg/dL 8.6   Total Protein 6.5 - 8.1 g/dL 6.6   Total Bilirubin 0.3 - 1.2 mg/dL 0.4   Alkaline Phos 38 - 126 U/L 120   AST 15 - 41  U/L 48   ALT 0 - 44 U/L 30       Latest Ref Rng & Units 08/04/2021    9:26 AM  CBC  WBC 4.0 - 10.5 K/uL 4.8   Hemoglobin 12.0 - 15.0 g/dL 13.2   Hematocrit 44.0 - 46.0 % 31.3   Platelets 150 - 400 K/uL 239     Assessment and plan- Patient is a 61 y.o. female with stage II pancreatic adenocarcinoma T2 N0 M0 s/p neoadjuvant and adjuvant modified FOLFIRINOX chemotherapy completed in April 2023 .  This is a routine follow-up visit  With regards to pancreatic cancer her CA 19-9 is normal and she recently had an appointment with Dr. Gwenlyn Perking at [facility].  She had surveillance imaging there which did not show any evidence of recurrent or progressive disease.  Her main concern however has been her ongoing weight loss and she is down to 97 pounds despite having a decent appetite and continuing protein shakes.  She met with our dietitian Marta Antu today.   Pancreatic enzymes trial was recommended I am sending her a prescription for the same.  Patient will continue receiving IV fluids every 2 weeks.  Her potassium has normalized but she will continue to take oral potassium once a day.  She will be seen by covering NP in 4 weeks and I will see her back in 8 weeks  Normocytic anemia: Likely secondary to chronic disease/recent chemotherapy.  Iron studies B12 folate are all normal.   Visit Diagnosis 1. Encounter for follow-up surveillance of pancreatic cancer   2. Hypoalbuminemia   3. Moderate protein-calorie malnutrition (HCC)      Dr. Owens Shark, MD, MPH [facility] [phone] 08/04/2021 12:08 PM

## 2021-08-19 ENCOUNTER — Inpatient Hospital Stay: Payer: BC Managed Care – PPO

## 2021-08-31 ENCOUNTER — Other Ambulatory Visit: Payer: Self-pay

## 2021-09-01 ENCOUNTER — Inpatient Hospital Stay: Payer: BC Managed Care – PPO | Attending: Oncology

## 2021-09-01 ENCOUNTER — Encounter: Payer: Self-pay | Admitting: Medical Oncology

## 2021-09-01 ENCOUNTER — Inpatient Hospital Stay: Payer: BC Managed Care – PPO

## 2021-09-01 ENCOUNTER — Inpatient Hospital Stay (HOSPITAL_BASED_OUTPATIENT_CLINIC_OR_DEPARTMENT_OTHER): Payer: BC Managed Care – PPO | Admitting: Medical Oncology

## 2021-09-01 VITALS — BP 136/48 | HR 67 | Temp 98.7°F | Resp 20 | Wt 96.0 lb

## 2021-09-01 DIAGNOSIS — Z888 Allergy status to other drugs, medicaments and biological substances status: Secondary | ICD-10-CM | POA: Diagnosis not present

## 2021-09-01 DIAGNOSIS — Z833 Family history of diabetes mellitus: Secondary | ICD-10-CM | POA: Diagnosis not present

## 2021-09-01 DIAGNOSIS — C259 Malignant neoplasm of pancreas, unspecified: Secondary | ICD-10-CM | POA: Diagnosis not present

## 2021-09-01 DIAGNOSIS — K589 Irritable bowel syndrome without diarrhea: Secondary | ICD-10-CM | POA: Diagnosis not present

## 2021-09-01 DIAGNOSIS — Z818 Family history of other mental and behavioral disorders: Secondary | ICD-10-CM | POA: Insufficient documentation

## 2021-09-01 DIAGNOSIS — R1012 Left upper quadrant pain: Secondary | ICD-10-CM | POA: Diagnosis not present

## 2021-09-01 DIAGNOSIS — Z8249 Family history of ischemic heart disease and other diseases of the circulatory system: Secondary | ICD-10-CM | POA: Insufficient documentation

## 2021-09-01 DIAGNOSIS — Z808 Family history of malignant neoplasm of other organs or systems: Secondary | ICD-10-CM | POA: Insufficient documentation

## 2021-09-01 DIAGNOSIS — R634 Abnormal weight loss: Secondary | ICD-10-CM | POA: Insufficient documentation

## 2021-09-01 DIAGNOSIS — Z79899 Other long term (current) drug therapy: Secondary | ICD-10-CM | POA: Diagnosis not present

## 2021-09-01 DIAGNOSIS — Z823 Family history of stroke: Secondary | ICD-10-CM | POA: Insufficient documentation

## 2021-09-01 DIAGNOSIS — E860 Dehydration: Secondary | ICD-10-CM | POA: Diagnosis not present

## 2021-09-01 DIAGNOSIS — F1290 Cannabis use, unspecified, uncomplicated: Secondary | ICD-10-CM | POA: Diagnosis not present

## 2021-09-01 DIAGNOSIS — Z87440 Personal history of urinary (tract) infections: Secondary | ICD-10-CM | POA: Insufficient documentation

## 2021-09-01 DIAGNOSIS — Z7952 Long term (current) use of systemic steroids: Secondary | ICD-10-CM | POA: Diagnosis not present

## 2021-09-01 DIAGNOSIS — E876 Hypokalemia: Secondary | ICD-10-CM | POA: Diagnosis not present

## 2021-09-01 DIAGNOSIS — R5383 Other fatigue: Secondary | ICD-10-CM | POA: Diagnosis not present

## 2021-09-01 DIAGNOSIS — Z87891 Personal history of nicotine dependence: Secondary | ICD-10-CM | POA: Insufficient documentation

## 2021-09-01 DIAGNOSIS — R220 Localized swelling, mass and lump, head: Secondary | ICD-10-CM | POA: Diagnosis not present

## 2021-09-01 DIAGNOSIS — Z8042 Family history of malignant neoplasm of prostate: Secondary | ICD-10-CM | POA: Insufficient documentation

## 2021-09-01 DIAGNOSIS — Z9049 Acquired absence of other specified parts of digestive tract: Secondary | ICD-10-CM | POA: Diagnosis not present

## 2021-09-01 DIAGNOSIS — G8929 Other chronic pain: Secondary | ICD-10-CM | POA: Insufficient documentation

## 2021-09-01 DIAGNOSIS — R1013 Epigastric pain: Secondary | ICD-10-CM | POA: Insufficient documentation

## 2021-09-01 DIAGNOSIS — Z811 Family history of alcohol abuse and dependence: Secondary | ICD-10-CM | POA: Insufficient documentation

## 2021-09-01 DIAGNOSIS — C253 Malignant neoplasm of pancreatic duct: Secondary | ICD-10-CM | POA: Insufficient documentation

## 2021-09-01 DIAGNOSIS — Z881 Allergy status to other antibiotic agents status: Secondary | ICD-10-CM | POA: Insufficient documentation

## 2021-09-01 DIAGNOSIS — Z806 Family history of leukemia: Secondary | ICD-10-CM | POA: Insufficient documentation

## 2021-09-01 DIAGNOSIS — F319 Bipolar disorder, unspecified: Secondary | ICD-10-CM | POA: Diagnosis not present

## 2021-09-01 DIAGNOSIS — Z803 Family history of malignant neoplasm of breast: Secondary | ICD-10-CM | POA: Diagnosis not present

## 2021-09-01 DIAGNOSIS — Z885 Allergy status to narcotic agent status: Secondary | ICD-10-CM | POA: Insufficient documentation

## 2021-09-01 DIAGNOSIS — Z882 Allergy status to sulfonamides status: Secondary | ICD-10-CM | POA: Diagnosis not present

## 2021-09-01 LAB — BASIC METABOLIC PANEL
Anion gap: 4 — ABNORMAL LOW (ref 5–15)
BUN: 10 mg/dL (ref 6–20)
CO2: 23 mmol/L (ref 22–32)
Calcium: 7.9 mg/dL — ABNORMAL LOW (ref 8.9–10.3)
Chloride: 109 mmol/L (ref 98–111)
Creatinine, Ser: 0.91 mg/dL (ref 0.44–1.00)
GFR, Estimated: >60 mL/min (ref >60)
Glucose, Bld: 138 mg/dL — ABNORMAL HIGH (ref 70–99)
Potassium: 3 mmol/L — ABNORMAL LOW (ref 3.5–5.1)
Sodium: 136 mmol/L (ref 135–145)

## 2021-09-01 LAB — CBC WITH DIFFERENTIAL/PLATELET
Abs Immature Granulocytes: 0.01 10*3/uL (ref 0.00–0.07)
Basophils Absolute: 0 10*3/uL (ref 0.0–0.1)
Basophils Relative: 0 %
Eosinophils Absolute: 0.1 10*3/uL (ref 0.0–0.5)
Eosinophils Relative: 2 %
HCT: 29.8 % — ABNORMAL LOW (ref 36.0–46.0)
Hemoglobin: 9.9 g/dL — ABNORMAL LOW (ref 12.0–15.0)
Immature Granulocytes: 0 %
Lymphocytes Relative: 29 %
Lymphs Abs: 1.4 10*3/uL (ref 0.7–4.0)
MCH: 33.6 pg (ref 26.0–34.0)
MCHC: 33.2 g/dL (ref 30.0–36.0)
MCV: 101 fL — ABNORMAL HIGH (ref 80.0–100.0)
Monocytes Absolute: 0.5 10*3/uL (ref 0.1–1.0)
Monocytes Relative: 11 %
Neutro Abs: 2.8 10*3/uL (ref 1.7–7.7)
Neutrophils Relative %: 58 %
Platelets: 171 10*3/uL (ref 150–400)
RBC: 2.95 MIL/uL — ABNORMAL LOW (ref 3.87–5.11)
RDW: 13 % (ref 11.5–15.5)
WBC: 4.7 10*3/uL (ref 4.0–10.5)
nRBC: 0 % (ref 0.0–0.2)

## 2021-09-01 MED ORDER — SODIUM CHLORIDE 0.9 % IV SOLN
Freq: Once | INTRAVENOUS | Status: AC
  Filled 2021-09-01: qty 250

## 2021-09-01 MED ORDER — HEPARIN SOD (PORK) LOCK FLUSH 100 UNIT/ML IV SOLN
500.0000 [IU] | Freq: Once | INTRAVENOUS | Status: AC | PRN
  Administered 2021-09-01: 500 [IU]
  Filled 2021-09-01: qty 5

## 2021-09-01 MED ORDER — SODIUM CHLORIDE 0.9% FLUSH
10.0000 mL | Freq: Once | INTRAVENOUS | Status: AC | PRN
  Administered 2021-09-01: 10 mL
  Filled 2021-09-01: qty 10

## 2021-09-01 MED ORDER — POTASSIUM CHLORIDE 20 MEQ/100ML IV SOLN
20.0000 meq | Freq: Once | INTRAVENOUS | Status: AC
  Administered 2021-09-01: 20 meq via INTRAVENOUS

## 2021-09-01 NOTE — Progress Notes (Signed)
Pt receiving Potassium plus IVF's today. She went back to only 1 potassium pill per day, states she will return to taking 2 daily.Suggested she start taking a calcium supplement as well. Pt accepted chips and a beverage while in clinic.

## 2021-09-01 NOTE — Progress Notes (Signed)
Nutrition Follow-up:  Patient with stage I pancreatic cancer.  Patient completed neoadjuvant chemotherapy, s/p whipple on 12/6 at [facility].  Patient completed 11 cycles of folfirinox chemotherapy.   Met with patient during fluids.  Patient says that she has not started pancreatic enzymes as never heard from pharmacy that prescription was ready.  Says that she usually drinks protein drink in the morning (2% milk with protein powder).  Does not like boost/ensure shakes any more.  Ate egg and cheese biscuit this am (3/4).  AM break at work eats couple of peanut butter crackers.  Lunch is usually beanie weanies and couples of crackers.  PM break is couple of crackers.  Supper is meat and vegetables.  Denies nausea.  Says she is not having diarrhea  Medications: creon  Labs: reviewed  Anthropometrics:   Weight 96 lb  97 lb on 6/27 106 lb 4 oz on 5/9 (edema) 110 lb 14.4 oz on 5/2 (edema)   NUTRITION DIAGNOSIS: Unintentional weight loss ongoing   INTERVENTION:  Patient will start pancreatic enzymes Reviewed calorie goal with patient (1300-1500 calories/daily).  5 day menu printed for 1500 calorie/day to show patient volume of food needed to increase weight.  Patient verbalized understanding.  Recommend increase protein shake to BID vs just daily.  Will drink at night after dinner and in the morning. Says she can't take to work because needs a nutribullet to mix it.      MONITORING, EVALUATION, GOAL: weight trends, intake   NEXT VISIT: Tuesday, August 22nd after MD visit and fluids  Shamina Etheridge B. Zenia Resides, [place], [place] Registered Dietitian [phone]

## 2021-09-01 NOTE — Progress Notes (Signed)
Hematology/Oncology Consult note [facility]  [phone] [phone]  Patient Care Team: Cletis Athens, MD as PCP - General (Internal Medicine) Grant Fontana, [place] as Referring Physician (Chiropractic Medicine) Christene Lye, MD (General Surgery) Sindy Guadeloupe, MD as Consulting Physician (Hematology and Oncology)   Name of the patient: Sheryl Perez  [id number]  04/30/1960   Date of visit: 09/01/21  Diagnosis- pancreatic adenocarcinoma stage II apT2 N1 M0  Chief complaint/ Reason for visit-routine follow-up of pancreatic cancer  Heme/Onc history: patient is a 61 year old female with a history of irritable bowel disease for which she follows up with [facility].  She has undergone EGD and colonoscopy with them in the past.  More recently patient was complaining of left upper quadrant and epigastric abdominal pain which causes sudden episodes of abdominal spasms and was prescribed as needed tramadol for it.  These episodes of pain are unrelated to food and are dull aching or throbbing.  She has undergone cholecystectomy in the past.  She underwent CT abdomen and pelvis with contrast in the ER on 08/04/2020 Which did not reveal any acute pathology which showed a possible hypoechoic 1.9 cm right hepatic lobe lesion.  This was followed by an MRI which showed no discrete lesion in the liver and the area of concern noted on CT scan was more likely to represent variable fat deposition.  However there was loss of normal T1 signal in the peripheral pancreas and a potential small pancreatic lesion suspicious for early pancreatic adenocarcinoma in the neck of the pancreas.     Patient underwent EUS by Dr. Mont Dutton which showed an irregular mass in the pancreatic neck measuring 15 x 11 mm.  FNA showed adenocarcinoma.  Pancreatic duct measured 1.1 mm in the head with abrupt dilatation in the region of the mass to 4.1 mm.  No abnormality noted in the common  bile duct and hepatic duct.  Celiac region showed no significant endosonographic abnormality.  No lymphadenopathy was seen.  CA 19-9 mildly elevated at 35   Patient was seen by Dr. Hyman Hopes from pancreaticobiliary surgery at [facility] for surgical opinion.  Although patient has a small lesion involving the pancreatic neck, there was a concern for tumor extension towards the splenic vein and narrowing of portal splenic confluence.  Patient received 3 months of neoadjuvant modified FOLFIRINOX chemotherapyAnd then underwent definitive Whipple surgery at [facility] with Dr. Hyman Hopes on 01/13/2021.  Final pathology showed 2.3 cm grade 2 moderately differentiated adenocarcinoma with lymphovascular and perineural invasion.  Treatment effect present with residual cancer showing evident tumor regression but more than single cells or rare small group of cancer cells] partial response.  Margins negative.  1 out of 22 lymph nodes positive for malignancy.   Patient completed adjuvant chemotherapy in March 2023.  She remains in remission    Interval history-she is back to working and is able to lift weights up to 20 pounds.  She reports that her appetite is stable. She is drinking one protein shake in the morning, having about 2 crackers for snack, has a can of "beanie weenies" for lunch and eats a normal dinner. She has not started the Creon enzymes yet as she has not picked them up from the pharmacy but plans to. Fatigue stable. No V/N/D/C.   ECOG PS- 1 Pain scale- 0  Review of systems- Review of Systems  Constitutional:  Positive for malaise/fatigue and weight loss. Negative for chills and fever.  HENT:  Negative for  congestion, ear discharge and nosebleeds.   Eyes:  Negative for blurred vision.  Respiratory:  Negative for cough, hemoptysis, sputum production, shortness of breath and wheezing.   Cardiovascular:  Negative for chest pain, palpitations, orthopnea and claudication.  Gastrointestinal:  Negative for abdominal  pain, blood in stool, constipation, diarrhea, heartburn, melena, nausea and vomiting.  Genitourinary:  Negative for dysuria, flank pain, frequency, hematuria and urgency.  Musculoskeletal:  Negative for back pain, joint pain and myalgias.  Skin:  Negative for rash.  Neurological:  Negative for dizziness, tingling, focal weakness, seizures, weakness and headaches.  Endo/Heme/Allergies:  Does not bruise/bleed easily.  Psychiatric/Behavioral:  Negative for depression and suicidal ideas. The patient does not have insomnia.       Allergies  Allergen Reactions   Sulfacetamide Sodium Swelling    Diarrhea,dizziness, tongue swelling   Other Other (See Comments) and Swelling    PT states that anything with the sides effects "may cause rash" she cannot take due to her psoriasis, it will make the patches on her hand so thick she can't use her hands.  Diarrhea,dizziness, tongue swelling   Oxycontin [Oxycodone Hcl] Nausea And Vomiting   Sulfa Antibiotics Swelling    Diarrhea,dizziness, tongue swelling   Abilify [Aripiprazole] Other (See Comments)   Cephalosporins Rash   Erythromycin Other (See Comments)    Abd. pain Abd. pain Other reaction(s): Other (See Comments) Abd. 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    Bipolar disorder (HCC)    Chest pain, unspecified    Chronic low back pain 10/23/2013   Chronic UTI    COPD (chronic obstructive pulmonary disease) (HCC)    Depression    Dysfunction of eustachian tube    Enlargement of lymph nodes    Family history of brain cancer    Family history of breast cancer    Family history of prostate cancer    History of Bell's palsy Right   Injury, other and unspecified, knee, leg, ankle, and foot    MRSA (methicillin resistant  staph aureus) culture positive 07/11/2014   abscess   Other specified disease of hair and hair follicles    Pancreatic cancer ([place])    Pancreatic cancer ([place])    Rectal prolapse    Unspecified symptom associated with female genital organs      Past Surgical History:  Procedure Laterality Date   ABDOMINAL HYSTERECTOMY     bladder tack  2010   CHOLECYSTECTOMY  2002   FOOT SURGERY Bilateral 2004   bunion   GANGLION CYST EXCISION  2002   INCISION AND DRAINAGE ABSCESS  04-16-15   PARTIAL HYSTERECTOMY  2010   uterus removed   PORTA CATH INSERTION N/A 09/11/2020   Procedure: PORTA CATH INSERTION;  Surgeon: Algernon Huxley, MD;  Location: [facility];  Service: Cardiovascular;  Laterality: N/A;    Social History   Socioeconomic History   Marital status: Married    Spouse name: Not on file   Number of children: 3   Years of education: hs   Highest education level: Some college, no degree  Occupational History   Not on file  Tobacco Use   Smoking status: Former    Packs/day: 0.50    Years:  40.00    Total pack years: 20.00    Types: Cigarettes    Quit date: 07/14/2020    Years since quitting: 1.1   Smokeless tobacco: Never  Vaping Use   Vaping Use: Never used  Substance and Sexual Activity   Alcohol use: No    Alcohol/week: 1.0 standard drink of alcohol    Types: 1 Glasses of wine per week   Drug use: Yes    Types: Marijuana    Comment: last used 05-20-15   Sexual activity: Yes    Birth control/protection: None  Other Topics Concern   Not on file  Social History Narrative   Patient lives at home with her husband [name]).   Patient works full time.   Education CNA    Right handed.   Caffeine Tea , Soda.   Social Determinants of Health   Financial Resource Strain: High Risk (01/10/2017)   Overall Financial Resource Strain (CARDIA)    Difficulty of Paying Living Expenses: Hard  Food Insecurity: No Food Insecurity (01/10/2017)   Hunger Vital Sign    Worried About  Running Out of Food in the Last Year: Never true    Ran Out of Food in the Last Year: Never true  Transportation Needs: No Transportation Needs (01/10/2017)   PRAPARE - Hydrologist (Medical): No    Lack of Transportation (Non-Medical): No  Physical Activity: Inactive (01/10/2017)   Exercise Vital Sign    Days of Exercise per Week: 0 days    Minutes of Exercise per Session: 0 min  Stress: Stress Concern Present (01/10/2017)   [place]    Feeling of Stress : Very much  Social Connections: Somewhat Isolated (01/10/2017)   Social Connection and Isolation Panel [NHANES]    Frequency of Communication with Friends and Family: More than three times a week    Frequency of Social Gatherings with Friends and Family: More than three times a week    Attends Religious Services: More than 4 times per year    Active Member of [employer] or Organizations: No    Attends Archivist Meetings: Never    Marital Status: Separated  Intimate Partner Violence: Not At Risk (01/10/2017)   Humiliation, Afraid, Rape, and Kick questionnaire    Fear of Current or Ex-Partner: No    Emotionally Abused: No    Physically Abused: No    Sexually Abused: No    Family History  Problem Relation Age of Onset   Diabetes Father    Cancer Father        brain tumor   Depression Father    Depression Sister    Alcohol abuse Brother    Depression Brother    Leukemia Maternal Aunt    Prostate cancer Maternal Uncle    Breast cancer Paternal Aunt        dx >50   Heart disease Maternal Grandmother    Heart attack Maternal Grandmother    Stroke Maternal Grandmother    Parkinson's disease Maternal Grandfather    Leukemia Paternal Grandmother    Diabetes Paternal Grandfather    Breast cancer Cousin    Brain cancer Niece        dx under 50     Current Outpatient Medications:    albuterol (VENTOLIN HFA) 108 (90 Base)  MCG/ACT inhaler, TAKE 2 PUFFS BY MOUTH EVERY 6 HOURS AS NEEDED FOR WHEEZE OR SHORTNESS OF BREATH, Disp: 8 each, Rfl:  4   Ascorbic Acid (VITAMIN C) 1000 MG tablet, Take 1,000 mg by mouth daily., Disp: , Rfl:    CREON 36000-114000 units CPEP capsule, TAKE 2 CAPSULES BY MOUTH 3 TIMES DAILY WITH MEALS. MAY ALSO TAKE 1 CAPSULE AS NEEDED (WITH SNACKS)., Disp: 240 capsule, Rfl: 11   divalproex (DEPAKOTE) 250 MG DR tablet, Take 1 tablet (250 mg total) by mouth 2 (two) times daily., Disp: 180 tablet, Rfl: 1   famotidine (PEPCID) 20 MG tablet, Take 1 tablet by mouth 2 (two) times daily., Disp: , Rfl:    ferrous sulfate 325 (65 FE) MG tablet, Take 325 mg by mouth 2 (two) times daily with a meal., Disp: , Rfl:    KLOR-CON M20 20 MEQ tablet, TAKE 1 TABLET BY MOUTH EVERY DAY, Disp: 90 tablet, Rfl: 1   loperamide (IMODIUM A-D) 2 MG tablet, Take 2 at onset of diarrhea, then 1 every 2hrs until 12hr without a BM. May take 2 tab every 4hrs at bedtime. If diarrhea recurs repeat., Disp: 100 tablet, Rfl: 1   loratadine (CLARITIN) 10 MG tablet, Take 10 mg by mouth daily., Disp: , Rfl:    Multiple Vitamin (MULTIVITAMIN) tablet, Take 1 tablet by mouth daily., Disp: , Rfl:    propranolol (INDERAL) 20 MG tablet, Take 1 tablet (20 mg total) by mouth 3 (three) times daily., Disp: 270 tablet, Rfl: 1   fluticasone (FLONASE) 50 MCG/ACT nasal spray, Place 1 spray into both nostrils daily. (Patient not taking: Reported on 08/04/2021), Disp: 11.1 mL, Rfl: 2   predniSONE (DELTASONE) 20 MG tablet, Take 1 tablet (20 mg total) by mouth daily with breakfast. (Patient not taking: Reported on 09/01/2021), Disp: 5 tablet, Rfl: 0 No current facility-administered medications for this visit.  Facility-Administered Medications Ordered in Other Visits:    0.9 %  sodium chloride infusion, , Intravenous, Once, Sindy Guadeloupe, MD, Last Rate: 999 mL/hr at 09/01/21 0927, New Bag at 09/01/21 0927   heparin lock flush 100 UNIT/ML injection, , , ,     heparin lock flush 100 unit/mL, 500 Units, Intravenous, Once, Sindy Guadeloupe, MD   heparin lock flush 100 unit/mL, 500 Units, Intracatheter, Once PRN, Sindy Guadeloupe, MD   sodium chloride flush (NS) 0.9 % injection 10 mL, 10 mL, Intracatheter, PRN, Sindy Guadeloupe, MD, 10 mL at 11/03/20 1441   sodium chloride flush (NS) 0.9 % injection 10 mL, 10 mL, Intravenous, PRN, Sindy Guadeloupe, MD  Physical exam:  Vitals:   09/01/21 0901  BP: (!) 136/48  Pulse: 67  Resp: 20  Temp: 98.7 F (37.1 C)  SpO2: 99%  Weight: 96 lb (43.5 kg)   Physical Exam Constitutional:      General: She is not in acute distress. Cardiovascular:     Rate and Rhythm: Normal rate and regular rhythm.     Heart sounds: Normal heart sounds.  Pulmonary:     Effort: Pulmonary effort is normal.     Breath sounds: Normal breath sounds.  Abdominal:     General: Bowel sounds are normal.     Palpations: Abdomen is soft.  Skin:    General: Skin is warm and dry.  Neurological:     Mental Status: She is alert and oriented to person, place, and time.         Latest Ref Rng & Units 08/04/2021    9:26 AM  CMP  Glucose 70 - 99 mg/dL 130   BUN 6 - 20 mg/dL 10  Creatinine 0.44 - 1.00 mg/dL 0.87   Sodium 135 - 145 mmol/L 138   Potassium 3.5 - 5.1 mmol/L 3.7   Chloride 98 - 111 mmol/L 111   CO2 22 - 32 mmol/L 21   Calcium 8.9 - 10.3 mg/dL 8.6   Total Protein 6.5 - 8.1 g/dL 6.6   Total Bilirubin 0.3 - 1.2 mg/dL 0.4   Alkaline Phos 38 - 126 U/L 120   AST 15 - 41 U/L 48   ALT 0 - 44 U/L 30       Latest Ref Rng & Units 09/01/2021    9:00 AM  CBC  WBC 4.0 - 10.5 K/uL 4.7   Hemoglobin 12.0 - 15.0 g/dL 9.9   Hematocrit 36.0 - 46.0 % 29.8   Platelets 150 - 400 K/uL 171     Assessment and plan- Patient is a 61 y.o. female with stage II pancreatic adenocarcinoma T2 N0 M0 s/p neoadjuvant and adjuvant modified FOLFIRINOX chemotherapy completed in April 2023 .  This is a routine follow-up visit  1 L IVF today. BP much  better today likely secondary to better hydration status. Offered to write a note so that she could have more time during her breaks to eat- she declined. She is meeting with Joli today in nutrition. She will try to increase her calorie intake. She will start her Creon. Continue home potassium supplementation. Dr. Janese Banks in 4 weeks.    Visit Diagnosis 1. Pancreatic adenocarcinoma ([place])   2. Hypokalemia   3. Dehydration      Nelwyn Salisbury PA-C [facility] [phone] 09/01/2021 9:36 AM

## 2021-09-08 ENCOUNTER — Other Ambulatory Visit: Payer: Self-pay

## 2021-09-28 ENCOUNTER — Other Ambulatory Visit: Payer: Self-pay | Admitting: *Deleted

## 2021-09-28 DIAGNOSIS — C259 Malignant neoplasm of pancreas, unspecified: Secondary | ICD-10-CM

## 2021-09-29 ENCOUNTER — Inpatient Hospital Stay: Payer: BC Managed Care – PPO

## 2021-09-29 ENCOUNTER — Inpatient Hospital Stay (HOSPITAL_BASED_OUTPATIENT_CLINIC_OR_DEPARTMENT_OTHER): Payer: BC Managed Care – PPO | Admitting: Oncology

## 2021-09-29 ENCOUNTER — Other Ambulatory Visit: Payer: Self-pay

## 2021-09-29 ENCOUNTER — Inpatient Hospital Stay: Payer: BC Managed Care – PPO | Attending: Oncology

## 2021-09-29 ENCOUNTER — Encounter: Payer: Self-pay | Admitting: Oncology

## 2021-09-29 VITALS — BP 115/38 | HR 68 | Temp 98.1°F | Resp 16 | Wt 102.2 lb

## 2021-09-29 DIAGNOSIS — Z803 Family history of malignant neoplasm of breast: Secondary | ICD-10-CM | POA: Diagnosis not present

## 2021-09-29 DIAGNOSIS — C259 Malignant neoplasm of pancreas, unspecified: Secondary | ICD-10-CM | POA: Diagnosis not present

## 2021-09-29 DIAGNOSIS — Z818 Family history of other mental and behavioral disorders: Secondary | ICD-10-CM | POA: Diagnosis not present

## 2021-09-29 DIAGNOSIS — Z833 Family history of diabetes mellitus: Secondary | ICD-10-CM | POA: Diagnosis not present

## 2021-09-29 DIAGNOSIS — Z888 Allergy status to other drugs, medicaments and biological substances status: Secondary | ICD-10-CM | POA: Diagnosis not present

## 2021-09-29 DIAGNOSIS — Z5986 Financial insecurity: Secondary | ICD-10-CM | POA: Diagnosis not present

## 2021-09-29 DIAGNOSIS — K589 Irritable bowel syndrome without diarrhea: Secondary | ICD-10-CM | POA: Insufficient documentation

## 2021-09-29 DIAGNOSIS — Z808 Family history of malignant neoplasm of other organs or systems: Secondary | ICD-10-CM | POA: Diagnosis not present

## 2021-09-29 DIAGNOSIS — Z881 Allergy status to other antibiotic agents status: Secondary | ICD-10-CM | POA: Insufficient documentation

## 2021-09-29 DIAGNOSIS — R1012 Left upper quadrant pain: Secondary | ICD-10-CM | POA: Insufficient documentation

## 2021-09-29 DIAGNOSIS — D649 Anemia, unspecified: Secondary | ICD-10-CM

## 2021-09-29 DIAGNOSIS — Z79899 Other long term (current) drug therapy: Secondary | ICD-10-CM | POA: Diagnosis not present

## 2021-09-29 DIAGNOSIS — Z806 Family history of leukemia: Secondary | ICD-10-CM | POA: Insufficient documentation

## 2021-09-29 DIAGNOSIS — Z7952 Long term (current) use of systemic steroids: Secondary | ICD-10-CM | POA: Diagnosis not present

## 2021-09-29 DIAGNOSIS — R5383 Other fatigue: Secondary | ICD-10-CM | POA: Diagnosis not present

## 2021-09-29 DIAGNOSIS — F319 Bipolar disorder, unspecified: Secondary | ICD-10-CM | POA: Diagnosis not present

## 2021-09-29 DIAGNOSIS — R1013 Epigastric pain: Secondary | ICD-10-CM | POA: Diagnosis not present

## 2021-09-29 DIAGNOSIS — Z885 Allergy status to narcotic agent status: Secondary | ICD-10-CM | POA: Diagnosis not present

## 2021-09-29 DIAGNOSIS — C253 Malignant neoplasm of pancreatic duct: Secondary | ICD-10-CM | POA: Insufficient documentation

## 2021-09-29 DIAGNOSIS — Z8249 Family history of ischemic heart disease and other diseases of the circulatory system: Secondary | ICD-10-CM | POA: Diagnosis not present

## 2021-09-29 DIAGNOSIS — Z87440 Personal history of urinary (tract) infections: Secondary | ICD-10-CM | POA: Diagnosis not present

## 2021-09-29 DIAGNOSIS — Z882 Allergy status to sulfonamides status: Secondary | ICD-10-CM | POA: Diagnosis not present

## 2021-09-29 DIAGNOSIS — F1290 Cannabis use, unspecified, uncomplicated: Secondary | ICD-10-CM | POA: Diagnosis not present

## 2021-09-29 DIAGNOSIS — E876 Hypokalemia: Secondary | ICD-10-CM

## 2021-09-29 DIAGNOSIS — Z9049 Acquired absence of other specified parts of digestive tract: Secondary | ICD-10-CM | POA: Insufficient documentation

## 2021-09-29 DIAGNOSIS — Z823 Family history of stroke: Secondary | ICD-10-CM | POA: Insufficient documentation

## 2021-09-29 LAB — COMPREHENSIVE METABOLIC PANEL
ALT: 32 U/L (ref 0–44)
AST: 43 U/L — ABNORMAL HIGH (ref 15–41)
Albumin: 3.6 g/dL (ref 3.5–5.0)
Alkaline Phosphatase: 103 U/L (ref 38–126)
Anion gap: 8 (ref 5–15)
BUN: 18 mg/dL (ref 6–20)
CO2: 23 mmol/L (ref 22–32)
Calcium: 8.8 mg/dL — ABNORMAL LOW (ref 8.9–10.3)
Chloride: 109 mmol/L (ref 98–111)
Creatinine, Ser: 0.91 mg/dL (ref 0.44–1.00)
GFR, Estimated: >60 mL/min (ref >60)
Glucose, Bld: 151 mg/dL — ABNORMAL HIGH (ref 70–99)
Potassium: 3.3 mmol/L — ABNORMAL LOW (ref 3.5–5.1)
Sodium: 140 mmol/L (ref 135–145)
Total Bilirubin: 0.3 mg/dL (ref 0.3–1.2)
Total Protein: 6.8 g/dL (ref 6.5–8.1)

## 2021-09-29 LAB — CBC WITH DIFFERENTIAL/PLATELET
Abs Immature Granulocytes: 0.01 10*3/uL (ref 0.00–0.07)
Basophils Absolute: 0 10*3/uL (ref 0.0–0.1)
Basophils Relative: 0 %
Eosinophils Absolute: 0.1 10*3/uL (ref 0.0–0.5)
Eosinophils Relative: 1 %
HCT: 32.3 % — ABNORMAL LOW (ref 36.0–46.0)
Hemoglobin: 10.5 g/dL — ABNORMAL LOW (ref 12.0–15.0)
Immature Granulocytes: 0 %
Lymphocytes Relative: 24 %
Lymphs Abs: 1.5 10*3/uL (ref 0.7–4.0)
MCH: 33.5 pg (ref 26.0–34.0)
MCHC: 32.5 g/dL (ref 30.0–36.0)
MCV: 103.2 fL — ABNORMAL HIGH (ref 80.0–100.0)
Monocytes Absolute: 0.4 10*3/uL (ref 0.1–1.0)
Monocytes Relative: 7 %
Neutro Abs: 4.3 10*3/uL (ref 1.7–7.7)
Neutrophils Relative %: 68 %
Platelets: 230 10*3/uL (ref 150–400)
RBC: 3.13 MIL/uL — ABNORMAL LOW (ref 3.87–5.11)
RDW: 12.8 % (ref 11.5–15.5)
WBC: 6.4 10*3/uL (ref 4.0–10.5)
nRBC: 0 % (ref 0.0–0.2)

## 2021-09-29 MED ORDER — HEPARIN SOD (PORK) LOCK FLUSH 100 UNIT/ML IV SOLN
500.0000 [IU] | Freq: Once | INTRAVENOUS | Status: AC
  Filled 2021-09-29: qty 5

## 2021-09-29 MED ORDER — POTASSIUM CHLORIDE CRYS ER 20 MEQ PO TBCR: 20.0000 meq | EXTENDED_RELEASE_TABLET | Freq: Two times a day (BID) | ORAL | 1 refills | Status: DC

## 2021-09-29 MED ORDER — SODIUM CHLORIDE 0.9% FLUSH
10.0000 mL | Freq: Once | INTRAVENOUS | Status: AC
  Administered 2021-09-29: 10 mL via INTRAVENOUS
  Filled 2021-09-29: qty 10

## 2021-09-29 NOTE — Progress Notes (Signed)
Pt states she has been trying to eat more; has not been taking CREON because she's fearful it might causes side effects.

## 2021-09-29 NOTE — Progress Notes (Signed)
Hematology/Oncology Consult note [facility]  [phone] [phone]  Patient Care Team: Cletis Athens, MD as PCP - General (Internal Medicine) Grant Fontana, [place] as Referring Physician (Chiropractic Medicine) Christene Lye, MD (General Surgery) Sindy Guadeloupe, MD as Consulting Physician (Hematology and Oncology)   Name of the patient: Sheryl Perez  [id number]  1961/01/19   Date of visit: 09/29/21  Diagnosis- pancreatic adenocarcinoma stage II apT2 N1 M0  Chief complaint/ Reason for visit-routine follow-up of pancreatic cancer  Heme/Onc history: patient is a 61 year old female with a history of irritable bowel disease for which she follows up with [facility].  She has undergone EGD and colonoscopy with them in the past.  More recently patient was complaining of left upper quadrant and epigastric abdominal pain which causes sudden episodes of abdominal spasms and was prescribed as needed tramadol for it.  These episodes of pain are unrelated to food and are dull aching or throbbing.  She has undergone cholecystectomy in the past.  She underwent CT abdomen and pelvis with contrast in the ER on 08/04/2020 Which did not reveal any acute pathology which showed a possible hypoechoic 1.9 cm right hepatic lobe lesion.  This was followed by an MRI which showed no discrete lesion in the liver and the area of concern noted on CT scan was more likely to represent variable fat deposition.  However there was loss of normal T1 signal in the peripheral pancreas and a potential small pancreatic lesion suspicious for early pancreatic adenocarcinoma in the neck of the pancreas.     Patient underwent EUS by Dr. Mont Dutton which showed an irregular mass in the pancreatic neck measuring 15 x 11 mm.  FNA showed adenocarcinoma.  Pancreatic duct measured 1.1 mm in the head with abrupt dilatation in the region of the mass to 4.1 mm.  No abnormality noted in the common  bile duct and hepatic duct.  Celiac region showed no significant endosonographic abnormality.  No lymphadenopathy was seen.  CA 19-9 mildly elevated at 35   Patient was seen by Dr. Hyman Hopes from pancreaticobiliary surgery at [facility] for surgical opinion.  Although patient has a small lesion involving the pancreatic neck, there was a concern for tumor extension towards the splenic vein and narrowing of portal splenic confluence.  Patient received 3 months of neoadjuvant modified FOLFIRINOX chemotherapyAnd then underwent definitive Whipple surgery at [facility] with Dr. Hyman Hopes on 01/13/2021.  Final pathology showed 2.3 cm grade 2 moderately differentiated adenocarcinoma with lymphovascular and perineural invasion.  Treatment effect present with residual cancer showing evident tumor regression but more than single cells or rare small group of cancer cells] partial response.  Margins negative.  1 out of 22 lymph nodes positive for malignancy.   Patient completed adjuvant chemotherapy in March 2023.  She remains in remission    Interval history-patient reports she is doing better.  She is eating better as well as consuming more proteinShakes.  Her weight is improved by 6 pounds in the last 1 month.  She has not started taking Creon as of now and is trying to improve her intake and weight without it.  ECOG PS- 1 Pain scale- 0 Opioid associated constipation- no  Review of systems- Review of Systems  Constitutional:  Positive for malaise/fatigue. Negative for chills, fever and weight loss.  HENT:  Negative for congestion, ear discharge and nosebleeds.   Eyes:  Negative for blurred vision.  Respiratory:  Negative for cough, hemoptysis, sputum production, shortness  of breath and wheezing.   Cardiovascular:  Negative for chest pain, palpitations, orthopnea and claudication.  Gastrointestinal:  Negative for abdominal pain, blood in stool, constipation, diarrhea, heartburn, melena, nausea and vomiting.   Genitourinary:  Negative for dysuria, flank pain, frequency, hematuria and urgency.  Musculoskeletal:  Negative for back pain, joint pain and myalgias.  Skin:  Negative for rash.  Neurological:  Negative for dizziness, tingling, focal weakness, seizures, weakness and headaches.  Endo/Heme/Allergies:  Does not bruise/bleed easily.  Psychiatric/Behavioral:  Negative for depression and suicidal ideas. The patient does not have insomnia.       Allergies  Allergen Reactions   Sulfacetamide Sodium Swelling    Diarrhea,dizziness, tongue swelling   Other Other (See Comments) and Swelling    PT states that anything with the sides effects "may cause rash" she cannot take due to her psoriasis, it will make the patches on her hand so thick she can't use her hands.  Diarrhea,dizziness, tongue swelling   Oxycontin [Oxycodone Hcl] Nausea And Vomiting   Sulfa Antibiotics Swelling    Diarrhea,dizziness, tongue swelling   Abilify [Aripiprazole] Other (See Comments)   Cephalosporins Rash   Erythromycin Other (See Comments)    Abd. pain Abd. pain Other reaction(s): Other (See Comments) Abd. 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    Bipolar disorder (HCC)    Chest pain, unspecified    Chronic low back pain 10/23/2013   Chronic UTI    COPD (chronic obstructive pulmonary disease) (HCC)    Depression    Dysfunction of eustachian tube    Enlargement of lymph nodes    Family history of brain cancer    Family history of breast cancer    Family history of prostate cancer    History of Bell's palsy Right   Injury, other and unspecified, knee, leg, ankle, and foot    MRSA (methicillin resistant staph aureus) culture positive 07/11/2014   abscess   Other specified disease of  hair and hair follicles    Pancreatic cancer ([place])    Pancreatic cancer ([place])    Rectal prolapse    Unspecified symptom associated with female genital organs      Past Surgical History:  Procedure Laterality Date   ABDOMINAL HYSTERECTOMY     bladder tack  2010   CHOLECYSTECTOMY  2002   FOOT SURGERY Bilateral 2004   bunion   GANGLION CYST EXCISION  2002   INCISION AND DRAINAGE ABSCESS  04-16-15   PARTIAL HYSTERECTOMY  2010   uterus removed   PORTA CATH INSERTION N/A 09/11/2020   Procedure: PORTA CATH INSERTION;  Surgeon: Algernon Huxley, MD;  Location: [facility];  Service: Cardiovascular;  Laterality: N/A;    Social History   Socioeconomic History   Marital status: Married    Spouse name: Not on file   Number of children: 3   Years of education: hs   Highest education level: Some college, no degree  Occupational History   Not on file  Tobacco Use   Smoking status: Former    Packs/day: 0.50    Years: 40.00    Total pack years: 20.00    Types: Cigarettes    Quit date: 07/14/2020    Years  since quitting: 1.2   Smokeless tobacco: Never  Vaping Use   Vaping Use: Never used  Substance and Sexual Activity   Alcohol use: No    Alcohol/week: 1.0 standard drink of alcohol    Types: 1 Glasses of wine per week   Drug use: Yes    Types: Marijuana    Comment: last used 05-20-15   Sexual activity: Yes    Birth control/protection: None  Other Topics Concern   Not on file  Social History Narrative   Patient lives at home with her husband [name]).   Patient works full time.   Education CNA    Right handed.   Caffeine Tea , Soda.   Social Determinants of Health   Financial Resource Strain: High Risk (01/10/2017)   Overall Financial Resource Strain (CARDIA)    Difficulty of Paying Living Expenses: Hard  Food Insecurity: No Food Insecurity (01/10/2017)   Hunger Vital Sign    Worried About Running Out of Food in the Last Year: Never true    Ran Out of Food in the Last  Year: Never true  Transportation Needs: No Transportation Needs (01/10/2017)   PRAPARE - Hydrologist (Medical): No    Lack of Transportation (Non-Medical): No  Physical Activity: Inactive (01/10/2017)   Exercise Vital Sign    Days of Exercise per Week: 0 days    Minutes of Exercise per Session: 0 min  Stress: Stress Concern Present (01/10/2017)   [place]    Feeling of Stress : Very much  Social Connections: Somewhat Isolated (01/10/2017)   Social Connection and Isolation Panel [NHANES]    Frequency of Communication with Friends and Family: More than three times a week    Frequency of Social Gatherings with Friends and Family: More than three times a week    Attends Religious Services: More than 4 times per year    Active Member of [employer] or Organizations: No    Attends Archivist Meetings: Never    Marital Status: Separated  Intimate Partner Violence: Not At Risk (01/10/2017)   Humiliation, Afraid, Rape, and Kick questionnaire    Fear of Current or Ex-Partner: No    Emotionally Abused: No    Physically Abused: No    Sexually Abused: No    Family History  Problem Relation Age of Onset   Diabetes Father    Cancer Father        brain tumor   Depression Father    Depression Sister    Alcohol abuse Brother    Depression Brother    Leukemia Maternal Aunt    Prostate cancer Maternal Uncle    Breast cancer Paternal Aunt        dx >50   Heart disease Maternal Grandmother    Heart attack Maternal Grandmother    Stroke Maternal Grandmother    Parkinson's disease Maternal Grandfather    Leukemia Paternal Grandmother    Diabetes Paternal Grandfather    Breast cancer Cousin    Brain cancer Niece        dx under 50     Current Outpatient Medications:    albuterol (VENTOLIN HFA) 108 (90 Base) MCG/ACT inhaler, TAKE 2 PUFFS BY MOUTH EVERY 6 HOURS AS NEEDED FOR WHEEZE OR SHORTNESS  OF BREATH, Disp: 8 each, Rfl: 4   Ascorbic Acid (VITAMIN C) 1000 MG tablet, Take 1,000 mg by mouth daily., Disp: , Rfl:    divalproex (  DEPAKOTE) 250 MG DR tablet, Take 1 tablet (250 mg total) by mouth 2 (two) times daily., Disp: 180 tablet, Rfl: 1   famotidine (PEPCID) 20 MG tablet, Take 1 tablet by mouth 2 (two) times daily., Disp: , Rfl:    ferrous sulfate 325 (65 FE) MG tablet, Take 325 mg by mouth 2 (two) times daily with a meal., Disp: , Rfl:    loperamide (IMODIUM A-D) 2 MG tablet, Take 2 at onset of diarrhea, then 1 every 2hrs until 12hr without a BM. May take 2 tab every 4hrs at bedtime. If diarrhea recurs repeat., Disp: 100 tablet, Rfl: 1   loratadine (CLARITIN) 10 MG tablet, Take 10 mg by mouth daily., Disp: , Rfl:    Multiple Vitamin (MULTIVITAMIN) tablet, Take 1 tablet by mouth daily., Disp: , Rfl:    propranolol (INDERAL) 20 MG tablet, Take 1 tablet (20 mg total) by mouth 3 (three) times daily., Disp: 270 tablet, Rfl: 1   CREON 36000-114000 units CPEP capsule, TAKE 2 CAPSULES BY MOUTH 3 TIMES DAILY WITH MEALS. MAY ALSO TAKE 1 CAPSULE AS NEEDED (WITH SNACKS). (Patient not taking: Reported on 09/29/2021), Disp: 240 capsule, Rfl: 11   fluticasone (FLONASE) 50 MCG/ACT nasal spray, Place 1 spray into both nostrils daily. (Patient not taking: Reported on 08/04/2021), Disp: 11.1 mL, Rfl: 2   potassium chloride SA (KLOR-CON M20) 20 MEQ tablet, Take 1 tablet (20 mEq total) by mouth 2 (two) times daily., Disp: 90 tablet, Rfl: 1   predniSONE (DELTASONE) 20 MG tablet, Take 1 tablet (20 mg total) by mouth daily with breakfast. (Patient not taking: Reported on 09/01/2021), Disp: 5 tablet, Rfl: 0 No current facility-administered medications for this visit.  Facility-Administered Medications Ordered in Other Visits:    heparin lock flush 100 UNIT/ML injection, , , ,    heparin lock flush 100 unit/mL, 500 Units, Intravenous, Once, Sindy Guadeloupe, MD   heparin lock flush 100 unit/mL, 500 Units,  Intravenous, Once, Sindy Guadeloupe, MD   sodium chloride flush (NS) 0.9 % injection 10 mL, 10 mL, Intracatheter, PRN, Sindy Guadeloupe, MD, 10 mL at 11/03/20 1441   sodium chloride flush (NS) 0.9 % injection 10 mL, 10 mL, Intravenous, PRN, Sindy Guadeloupe, MD  Physical exam:  Vitals:   09/29/21 0913  BP: (!) 115/38  Pulse: 68  Resp: 16  Temp: 98.1 F (36.7 C)  SpO2: 100%  Weight: 102 lb 3.2 oz (46.4 kg)   Physical Exam Constitutional:      General: She is not in acute distress. Cardiovascular:     Rate and Rhythm: Normal rate and regular rhythm.     Heart sounds: Normal heart sounds.  Pulmonary:     Effort: Pulmonary effort is normal.     Breath sounds: Normal breath sounds.  Abdominal:     General: Bowel sounds are normal.     Palpations: Abdomen is soft.  Skin:    General: Skin is warm and dry.  Neurological:     Mental Status: She is alert and oriented to person, place, and time.         Latest Ref Rng & Units 09/29/2021    9:15 AM  CMP  Glucose 70 - 99 mg/dL 151   BUN 6 - 20 mg/dL 18   Creatinine 0.44 - 1.00 mg/dL 0.91   Sodium 135 - 145 mmol/L 140   Potassium 3.5 - 5.1 mmol/L 3.3   Chloride 98 - 111 mmol/L 109   CO2 22 -  32 mmol/L 23   Calcium 8.9 - 10.3 mg/dL 8.8   Total Protein 6.5 - 8.1 g/dL 6.8   Total Bilirubin 0.3 - 1.2 mg/dL 0.3   Alkaline Phos 38 - 126 U/L 103   AST 15 - 41 U/L 43   ALT 0 - 44 U/L 32       Latest Ref Rng & Units 09/29/2021    9:15 AM  CBC  WBC 4.0 - 10.5 K/uL 6.4   Hemoglobin 12.0 - 15.0 g/dL 10.5   Hematocrit 36.0 - 46.0 % 32.3   Platelets 150 - 400 K/uL 230     Assessment and plan- Patient is a 61 y.o. female with stage II pancreatic adenocarcinoma T2 N0 M0 s/p neoadjuvant and adjuvant modified FOLFIRINOX chemotherapy completed in April 2023 .  She is here for routine follow-up visit  Clinically patient is doing well.  Overall her weight is improving and she is feeling better.  She has a repeat scan scheduled at [facility] in  December 2023.  I will see her back in 2 months with CA 19-9.  Hypokalemia: Continue oral potassium and prescription has been renewed today.  Normocytic anemia: Likely secondary to prolonged effect of chemotherapy   Visit Diagnosis 1. Pancreatic adenocarcinoma ([place])   2. Hypokalemia   3. Normocytic anemia      Dr. Randa Evens, MD, MPH [facility] at [facility] [phone] 09/29/2021 1:22 PM

## 2021-09-29 NOTE — Progress Notes (Signed)
Nutrition Follow-up:  Patient with stage I pancreatic cancer.  Completed neoadjuvant chemotherapy, s/p whipple on 12/6 at [facility].  Patient completed 11 cycles of folfirinox.    Planning to meet with patient today during fluids but did not need fluids and released from clinic before RD could see her.   Per nursing eating better. Noted did not start creon for fear of side effects.    Medications: reviewed  Labs: reviewed  Anthropometrics:   Weight 102 lb 3.2 oz today, increased  96 lb on 7/25 97 lb on 6/27 106 lb 4 oz on 5/9 (edema) 110 lb on 14.4 oz on 5/2 (edema)  NUTRITION DIAGNOSIS: Unintentional weight loss improving   INTERVENTION:  Called patient to congratulate her!  No answer. Left message with call back number.   RD available as needed    MONITORING, EVALUATION, GOAL: weight trends, intake   NEXT VISIT: as needed  Kharma Sampsel B. Zenia Resides, [place], [place] Registered Dietitian [phone]

## 2021-09-30 ENCOUNTER — Other Ambulatory Visit: Payer: Self-pay

## 2021-10-26 ENCOUNTER — Encounter: Payer: Self-pay | Admitting: Internal Medicine

## 2021-10-26 ENCOUNTER — Encounter: Payer: Self-pay | Admitting: Oncology

## 2021-10-26 ENCOUNTER — Ambulatory Visit (INDEPENDENT_AMBULATORY_CARE_PROVIDER_SITE_OTHER): Payer: BC Managed Care – PPO | Admitting: Internal Medicine

## 2021-10-26 VITALS — BP 128/84 | HR 86 | Ht 60.0 in | Wt 109.9 lb

## 2021-10-26 DIAGNOSIS — I700 Atherosclerosis of aorta: Secondary | ICD-10-CM

## 2021-10-26 DIAGNOSIS — R600 Localized edema: Secondary | ICD-10-CM | POA: Diagnosis not present

## 2021-10-26 DIAGNOSIS — J0110 Acute frontal sinusitis, unspecified: Secondary | ICD-10-CM

## 2021-10-26 DIAGNOSIS — F3178 Bipolar disorder, in full remission, most recent episode mixed: Secondary | ICD-10-CM | POA: Diagnosis not present

## 2021-10-26 DIAGNOSIS — C259 Malignant neoplasm of pancreas, unspecified: Secondary | ICD-10-CM

## 2021-10-26 DIAGNOSIS — F1220 Cannabis dependence, uncomplicated: Secondary | ICD-10-CM | POA: Diagnosis not present

## 2021-10-26 NOTE — Assessment & Plan Note (Signed)
Patient will be sent to vascular she has evidence of DVT

## 2021-10-26 NOTE — Assessment & Plan Note (Signed)
Take Claritin 10 mg p.o. daily 

## 2021-10-26 NOTE — Assessment & Plan Note (Signed)
Patient is a postsurgery complaining of swelling of the right leg

## 2021-10-26 NOTE — Assessment & Plan Note (Signed)
Stable at the present time. 

## 2021-10-26 NOTE — Progress Notes (Signed)
Established Patient Office Visit  Subjective:  Patient ID: Sheryl Perez, female    DOB: August 27, 1960  Age: 61 y.o. 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    Bipolar disorder ([place])    Chest pain, unspecified    Chronic low back pain 10/23/2013   Chronic UTI    COPD (chronic obstructive pulmonary disease) (HCC)    Depression    Dysfunction of eustachian tube    Enlargement of lymph nodes    Family history of brain cancer    Family history of breast cancer    Family history of prostate cancer    History of Bell's palsy Right   Injury, other and unspecified, knee, leg, ankle, and foot    MRSA (methicillin resistant staph aureus) culture positive 07/11/2014   abscess   Other specified disease of hair and hair follicles    Pancreatic cancer ([place])    Pancreatic cancer ([place])    Rectal prolapse    Unspecified symptom associated with female genital organs     Past Surgical History:  Procedure Laterality Date   ABDOMINAL HYSTERECTOMY     bladder tack  2010   CHOLECYSTECTOMY  2002   FOOT SURGERY Bilateral 2004   bunion   GANGLION CYST EXCISION  2002   INCISION AND DRAINAGE ABSCESS  04-16-15   PARTIAL HYSTERECTOMY  2010   uterus removed   PORTA CATH INSERTION N/A 09/11/2020   Procedure: PORTA CATH INSERTION;  Surgeon: Algernon Huxley, MD;  Location: [facility];  Service: Cardiovascular;  Laterality: N/A;    Family History  Problem Relation Age of Onset   Diabetes Father    Cancer Father        brain tumor   Depression Father    Depression Sister    Alcohol abuse Brother    Depression Brother    Leukemia Maternal Aunt    Prostate cancer Maternal Uncle    Breast cancer Paternal Aunt        dx  >50   Heart disease Maternal Grandmother    Heart attack Maternal Grandmother    Stroke Maternal Grandmother    Parkinson's disease Maternal Grandfather    Leukemia Paternal Grandmother    Diabetes Paternal Grandfather    Breast cancer Cousin    Brain cancer Niece        dx under 41    Social History   Socioeconomic History   Marital status: Married    Spouse name: Not on file   Number of children: 3   Years of education: hs   Highest education level: Some college, no degree  Occupational History   Not on file  Tobacco Use   Smoking status: Former    Packs/day: 0.50    Years: 40.00    Total pack years: 20.00    Types: Cigarettes    Quit date: 07/14/2020    Years since quitting: 1.2   Smokeless tobacco: Never  Vaping Use   Vaping Use: Never used  Substance and Sexual Activity   Alcohol use: No    Alcohol/week: 1.0 standard drink of alcohol    Types: 1 Glasses of  wine per week   Drug use: Yes    Types: Marijuana    Comment: last used 05-20-15   Sexual activity: Yes    Birth control/protection: None  Other Topics Concern   Not on file  Social History Narrative   Patient lives at home with her husband [name]).   Patient works full time.   Education CNA    Right handed.   Caffeine Tea , Soda.   Social Determinants of Health   Financial Resource Strain: High Risk (01/10/2017)   Overall Financial Resource Strain (CARDIA)    Difficulty of Paying Living Expenses: Hard  Food Insecurity: No Food Insecurity (01/10/2017)   Hunger Vital Sign    Worried About Running Out of Food in the Last Year: Never true    Ran Out of Food in the Last Year: Never true  Transportation Needs: No Transportation Needs (01/10/2017)   PRAPARE - Hydrologist (Medical): No    Lack of Transportation (Non-Medical): No  Physical Activity: Inactive (01/10/2017)   Exercise Vital Sign    Days of Exercise per Week: 0 days    Minutes of Exercise per Session: 0 min   Stress: Stress Concern Present (01/10/2017)   [place]    Feeling of Stress : Very much  Social Connections: Somewhat Isolated (01/10/2017)   Social Connection and Isolation Panel [NHANES]    Frequency of Communication with Friends and Family: More than three times a week    Frequency of Social Gatherings with Friends and Family: More than three times a week    Attends Religious Services: More than 4 times per year    Active Member of [employer] or Organizations: No    Attends Archivist Meetings: Never    Marital Status: Separated  Intimate Partner Violence: Not At Risk (01/10/2017)   Humiliation, Afraid, Rape, and Kick questionnaire    Fear of Current or Ex-Partner: No    Emotionally Abused: No    Physically Abused: No    Sexually Abused: No     Current Outpatient Medications:    albuterol (VENTOLIN HFA) 108 (90 Base) MCG/ACT inhaler, TAKE 2 PUFFS BY MOUTH EVERY 6 HOURS AS NEEDED FOR WHEEZE OR SHORTNESS OF BREATH, Disp: 8 each, Rfl: 4   Ascorbic Acid (VITAMIN C) 1000 MG tablet, Take 1,000 mg by mouth daily., Disp: , Rfl:    divalproex (DEPAKOTE) 250 MG DR tablet, Take 1 tablet (250 mg total) by mouth 2 (two) times daily., Disp: 180 tablet, Rfl: 1   famotidine (PEPCID) 20 MG tablet, Take 1 tablet by mouth 2 (two) times daily., Disp: , Rfl:    ferrous sulfate 325 (65 FE) MG tablet, Take 325 mg by mouth 2 (two) times daily with a meal., Disp: , Rfl:    loperamide (IMODIUM A-D) 2 MG tablet, Take 2 at onset of diarrhea, then 1 every 2hrs until 12hr without a BM. May take 2 tab every 4hrs at bedtime. If diarrhea recurs repeat., Disp: 100 tablet, Rfl: 1   loratadine (CLARITIN) 10 MG tablet, Take 10 mg by mouth daily., Disp: , Rfl:    Multiple Vitamin (MULTIVITAMIN) tablet, Take 1 tablet by mouth daily., Disp: , Rfl:    potassium chloride SA (KLOR-CON M20) 20 MEQ tablet, Take 1 tablet (20 mEq total) by mouth 2 (two) times  daily., Disp: 90 tablet, Rfl: 1   propranolol (INDERAL) 20 MG tablet, Take 1 tablet (20 mg total) by  mouth 3 (three) times daily., Disp: 270 tablet, Rfl: 1 No current facility-administered medications for this visit.  Facility-Administered Medications Ordered in Other Visits:    heparin lock flush 100 UNIT/ML injection, , , ,    heparin lock flush 100 unit/mL, 500 Units, Intravenous, Once, Sindy Guadeloupe, MD   heparin lock flush 100 unit/mL, 500 Units, Intravenous, Once, Sindy Guadeloupe, MD   sodium chloride flush (NS) 0.9 % injection 10 mL, 10 mL, Intracatheter, PRN, Sindy Guadeloupe, MD, 10 mL at 11/03/20 1441   sodium chloride flush (NS) 0.9 % injection 10 mL, 10 mL, Intravenous, PRN, Sindy Guadeloupe, MD   Allergies  Allergen Reactions   Sulfacetamide Sodium Swelling    Diarrhea,dizziness, tongue swelling   Other Other (See Comments) and Swelling    PT states that anything with the sides effects "may cause rash" she cannot take due to her psoriasis, it will make the patches on her hand so thick she can't use her hands.  Diarrhea,dizziness, tongue swelling   Oxycontin [Oxycodone Hcl] Nausea And Vomiting   Sulfa Antibiotics Swelling    Diarrhea,dizziness, tongue swelling   Abilify [Aripiprazole] Other (See Comments)   Cephalosporins Rash   Erythromycin Other (See Comments)    Abd. pain Abd. pain Other reaction(s): Other (See Comments) Abd. pain   Lamotrigine Rash and Other (See Comments)    psorasis worsen psorasis worsen   Risperidone Other (See Comments)    tremors tremors tremors    ROS Review of Systems  Constitutional: Negative.   HENT: Negative.    Eyes: Negative.   Respiratory: Negative.    Cardiovascular: Negative.   Gastrointestinal: Negative.   Endocrine: Negative.   Genitourinary: Negative.   Musculoskeletal: Negative.   Skin: Negative.   Allergic/Immunologic: Negative.   Neurological: Negative.   Hematological: Negative.   Psychiatric/Behavioral: Negative.     All other systems reviewed and are negative.     Objective:    Physical Exam Vitals reviewed.  Constitutional:      Appearance: Normal appearance.  HENT:     Mouth/Throat:     Mouth: Mucous membranes are moist.  Eyes:     Pupils: Pupils are equal, round, and reactive to light.  Neck:     Vascular: No carotid bruit.  Cardiovascular:     Rate and Rhythm: Normal rate and regular rhythm.     Pulses: Normal pulses.     Heart sounds: Normal heart sounds.  Pulmonary:     Effort: Pulmonary effort is normal.     Breath sounds: Normal breath sounds.  Abdominal:     General: Bowel sounds are normal.     Palpations: Abdomen is soft. There is no hepatomegaly, splenomegaly or mass.     Tenderness: There is no abdominal tenderness.     Hernia: No hernia is present.  Musculoskeletal:        General: No tenderness.     Cervical back: Neck supple.     Right lower leg: No edema.     Left lower leg: No edema.       Legs:     Comments: Rt leg and ankle is swollen  Skin:    Findings: No rash.  Neurological:     Mental Status: She is alert and oriented to person, place, and time.     Motor: No weakness.  Psychiatric:        Mood and Affect: Mood and affect normal.        Behavior: Behavior normal.  BP 128/84   Pulse 86   Ht 5' (1.524 m)   Wt 109 lb 14.4 oz (49.9 kg)   LMP 02/09/2008   BMI 21.46 kg/m  Wt Readings from Last 3 Encounters:  10/26/21 109 lb 14.4 oz (49.9 kg)  09/29/21 102 lb 3.2 oz (46.4 kg)  09/01/21 96 lb (43.5 kg)     Health Maintenance Due  Topic Date Due   PAP SMEAR-Modifier  11/01/2016    There are no preventive care reminders to display for this patient.  Lab Results  Component Value Date   TSH 1.241 06/16/2021   Lab Results  Component Value Date   WBC 6.4 09/29/2021   HGB 10.5 (L) 09/29/2021   HCT 32.3 (L) 09/29/2021   MCV 103.2 (H) 09/29/2021   PLT 230 09/29/2021   Lab Results  Component Value Date   NA 140 09/29/2021   K 3.3  (L) 09/29/2021   CO2 23 09/29/2021   GLUCOSE 151 (H) 09/29/2021   BUN 18 09/29/2021   CREATININE 0.91 09/29/2021   BILITOT 0.3 09/29/2021   ALKPHOS 103 09/29/2021   AST 43 (H) 09/29/2021   ALT 32 09/29/2021   PROT 6.8 09/29/2021   ALBUMIN 3.6 09/29/2021   CALCIUM 8.8 (L) 09/29/2021   ANIONGAP 8 09/29/2021   Lab Results  Component Value Date   CHOL 197 12/12/2019   Lab Results  Component Value Date   HDL 51 12/12/2019   Lab Results  Component Value Date   LDLCALC 123 (H) 12/12/2019   Lab Results  Component Value Date   TRIG 68 08/04/2020   Lab Results  Component Value Date   CHOLHDL 3.9 12/12/2019   Lab Results  Component Value Date   HGBA1C 5.3 08/24/2018      Assessment & Plan:   Problem List Items Addressed This Visit       Cardiovascular and Mediastinum   Atherosclerosis of aorta (HCC)     Respiratory   Acute frontal sinusitis    Take Claritin 10 mg p.o. daily        Digestive   Pancreatic adenocarcinoma ([place])    Patient is a postsurgery complaining of swelling of the right leg        Other   Cannabis use disorder, moderate, dependence ([place])    Patient was advised to stop using marijuana      Bipolar disorder, in full remission, most recent episode mixed ([place])    Stable at the present time      Lower extremity edema - Primary    Patient will be sent to vascular she has evidence of DVT      Relevant Orders   Ambulatory referral to Vascular Surgery  Patient was started on Eliquis 5 mg p.o. twice a day.,  She was also referred to the vascular specialist, she was told that if she is not seen by vascular specialist in 1 or 2-day she should go to the hospital emergency  No orders of the defined types were placed in this encounter.   Follow-up: No follow-ups on file.    Cletis Athens, MD

## 2021-10-26 NOTE — Assessment & Plan Note (Signed)
Patient was advised to stop using marijuana

## 2021-10-27 ENCOUNTER — Other Ambulatory Visit (INDEPENDENT_AMBULATORY_CARE_PROVIDER_SITE_OTHER): Payer: Self-pay | Admitting: Nurse Practitioner

## 2021-10-27 DIAGNOSIS — R600 Localized edema: Secondary | ICD-10-CM

## 2021-10-28 ENCOUNTER — Ambulatory Visit (INDEPENDENT_AMBULATORY_CARE_PROVIDER_SITE_OTHER): Payer: BC Managed Care – PPO

## 2021-10-28 ENCOUNTER — Encounter (INDEPENDENT_AMBULATORY_CARE_PROVIDER_SITE_OTHER): Payer: Self-pay | Admitting: Nurse Practitioner

## 2021-10-28 ENCOUNTER — Telehealth: Payer: Self-pay | Admitting: *Deleted

## 2021-10-28 DIAGNOSIS — R600 Localized edema: Secondary | ICD-10-CM

## 2021-10-28 NOTE — Telephone Encounter (Signed)
Yes smc

## 2021-10-28 NOTE — Telephone Encounter (Signed)
Patient called stating that she just left [facility] and was told that she does not have a clot, she has swollen lymph nodes damaged by chemotherapy and is having pockets of fluid building up. She states that she needs an appointment with Dr Janese Banks to evaluated her leg asap.

## 2021-11-02 ENCOUNTER — Other Ambulatory Visit: Payer: Self-pay

## 2021-11-02 ENCOUNTER — Inpatient Hospital Stay: Payer: BC Managed Care – PPO

## 2021-11-02 ENCOUNTER — Inpatient Hospital Stay: Payer: BC Managed Care – PPO | Attending: Oncology | Admitting: Nurse Practitioner

## 2021-11-02 VITALS — BP 109/51 | HR 90 | Temp 97.2°F | Resp 16 | Wt 112.0 lb

## 2021-11-02 DIAGNOSIS — Z79899 Other long term (current) drug therapy: Secondary | ICD-10-CM | POA: Diagnosis not present

## 2021-11-02 DIAGNOSIS — R109 Unspecified abdominal pain: Secondary | ICD-10-CM | POA: Insufficient documentation

## 2021-11-02 DIAGNOSIS — Z808 Family history of malignant neoplasm of other organs or systems: Secondary | ICD-10-CM | POA: Diagnosis not present

## 2021-11-02 DIAGNOSIS — C259 Malignant neoplasm of pancreas, unspecified: Secondary | ICD-10-CM

## 2021-11-02 DIAGNOSIS — Z811 Family history of alcohol abuse and dependence: Secondary | ICD-10-CM | POA: Insufficient documentation

## 2021-11-02 DIAGNOSIS — Z9049 Acquired absence of other specified parts of digestive tract: Secondary | ICD-10-CM | POA: Diagnosis not present

## 2021-11-02 DIAGNOSIS — Z833 Family history of diabetes mellitus: Secondary | ICD-10-CM | POA: Diagnosis not present

## 2021-11-02 DIAGNOSIS — Z803 Family history of malignant neoplasm of breast: Secondary | ICD-10-CM | POA: Insufficient documentation

## 2021-11-02 DIAGNOSIS — Z818 Family history of other mental and behavioral disorders: Secondary | ICD-10-CM | POA: Diagnosis not present

## 2021-11-02 DIAGNOSIS — Z5986 Financial insecurity: Secondary | ICD-10-CM | POA: Diagnosis not present

## 2021-11-02 DIAGNOSIS — Z87891 Personal history of nicotine dependence: Secondary | ICD-10-CM | POA: Diagnosis not present

## 2021-11-02 DIAGNOSIS — M7989 Other specified soft tissue disorders: Secondary | ICD-10-CM | POA: Diagnosis not present

## 2021-11-02 DIAGNOSIS — F319 Bipolar disorder, unspecified: Secondary | ICD-10-CM | POA: Diagnosis not present

## 2021-11-02 DIAGNOSIS — Z823 Family history of stroke: Secondary | ICD-10-CM | POA: Diagnosis not present

## 2021-11-02 DIAGNOSIS — M25473 Effusion, unspecified ankle: Secondary | ICD-10-CM | POA: Insufficient documentation

## 2021-11-02 DIAGNOSIS — Z87440 Personal history of urinary (tract) infections: Secondary | ICD-10-CM | POA: Diagnosis not present

## 2021-11-02 DIAGNOSIS — R600 Localized edema: Secondary | ICD-10-CM | POA: Diagnosis not present

## 2021-11-02 DIAGNOSIS — Z882 Allergy status to sulfonamides status: Secondary | ICD-10-CM | POA: Diagnosis not present

## 2021-11-02 DIAGNOSIS — R5383 Other fatigue: Secondary | ICD-10-CM | POA: Insufficient documentation

## 2021-11-02 DIAGNOSIS — Z95828 Presence of other vascular implants and grafts: Secondary | ICD-10-CM

## 2021-11-02 DIAGNOSIS — Z881 Allergy status to other antibiotic agents status: Secondary | ICD-10-CM | POA: Diagnosis not present

## 2021-11-02 DIAGNOSIS — M79661 Pain in right lower leg: Secondary | ICD-10-CM | POA: Diagnosis not present

## 2021-11-02 DIAGNOSIS — Z806 Family history of leukemia: Secondary | ICD-10-CM | POA: Insufficient documentation

## 2021-11-02 DIAGNOSIS — Z885 Allergy status to narcotic agent status: Secondary | ICD-10-CM | POA: Diagnosis not present

## 2021-11-02 DIAGNOSIS — F1290 Cannabis use, unspecified, uncomplicated: Secondary | ICD-10-CM | POA: Diagnosis not present

## 2021-11-02 DIAGNOSIS — Z8042 Family history of malignant neoplasm of prostate: Secondary | ICD-10-CM | POA: Diagnosis not present

## 2021-11-02 DIAGNOSIS — Z888 Allergy status to other drugs, medicaments and biological substances status: Secondary | ICD-10-CM | POA: Diagnosis not present

## 2021-11-02 DIAGNOSIS — Z8249 Family history of ischemic heart disease and other diseases of the circulatory system: Secondary | ICD-10-CM | POA: Insufficient documentation

## 2021-11-02 DIAGNOSIS — C253 Malignant neoplasm of pancreatic duct: Secondary | ICD-10-CM | POA: Insufficient documentation

## 2021-11-02 LAB — COMPREHENSIVE METABOLIC PANEL
ALT: 23 U/L (ref 0–44)
AST: 30 U/L (ref 15–41)
Albumin: 3.4 g/dL — ABNORMAL LOW (ref 3.5–5.0)
Alkaline Phosphatase: 130 U/L — ABNORMAL HIGH (ref 38–126)
Anion gap: 3 — ABNORMAL LOW (ref 5–15)
BUN: 14 mg/dL (ref 8–23)
CO2: 24 mmol/L (ref 22–32)
Calcium: 8.3 mg/dL — ABNORMAL LOW (ref 8.9–10.3)
Chloride: 111 mmol/L (ref 98–111)
Creatinine, Ser: 0.95 mg/dL (ref 0.44–1.00)
GFR, Estimated: >60 mL/min (ref >60)
Glucose, Bld: 100 mg/dL — ABNORMAL HIGH (ref 70–99)
Potassium: 3.5 mmol/L (ref 3.5–5.1)
Sodium: 138 mmol/L (ref 135–145)
Total Bilirubin: 0.2 mg/dL — ABNORMAL LOW (ref 0.3–1.2)
Total Protein: 6.8 g/dL (ref 6.5–8.1)

## 2021-11-02 LAB — CBC
HCT: 29.5 % — ABNORMAL LOW (ref 36.0–46.0)
Hemoglobin: 9.8 g/dL — ABNORMAL LOW (ref 12.0–15.0)
MCH: 33.6 pg (ref 26.0–34.0)
MCHC: 33.2 g/dL (ref 30.0–36.0)
MCV: 101 fL — ABNORMAL HIGH (ref 80.0–100.0)
Platelets: 269 10*3/uL (ref 150–400)
RBC: 2.92 MIL/uL — ABNORMAL LOW (ref 3.87–5.11)
RDW: 12.6 % (ref 11.5–15.5)
WBC: 8.2 10*3/uL (ref 4.0–10.5)
nRBC: 0 % (ref 0.0–0.2)

## 2021-11-02 MED ORDER — HEPARIN SOD (PORK) LOCK FLUSH 100 UNIT/ML IV SOLN
500.0000 [IU] | Freq: Once | INTRAVENOUS | Status: AC
  Administered 2021-11-02: 500 [IU] via INTRAVENOUS
  Filled 2021-11-02: qty 5

## 2021-11-02 MED ORDER — SODIUM CHLORIDE 0.9% FLUSH
10.0000 mL | Freq: Once | INTRAVENOUS | Status: AC
  Administered 2021-11-02: 10 mL via INTRAVENOUS
  Filled 2021-11-02: qty 10

## 2021-11-02 NOTE — Progress Notes (Signed)
Pt seen by vascular for possible DVT. Doppler U/S was negative. She was told that the reason for her swelling was due to "swollen lymph nodes", and was instructed to wear compression socks and to f/u with our clinic. Reports intermittent stabbing pains in both legs.

## 2021-11-02 NOTE — Progress Notes (Signed)
Symptom Management [place] at [place]. [facility] [address] [place], [place] [address] [phone] (phone) [phone] (fax)  Patient Care Team: Cletis Athens, MD as PCP - General (Internal Medicine) Grant Fontana, [place] as Referring Physician (Chiropractic Medicine) Christene Lye, MD (General Surgery) Sindy Guadeloupe, MD as Consulting Physician (Hematology and Oncology)   Name of the patient: Sheryl Perez  [id number]  [birth year]   Date of visit: 11/02/21  Diagnosis- pancreatic adenocarcinoma  Chief complaint/ Reason for visit- leg swelling  Heme/Onc history:  Oncology History  Pancreatic adenocarcinoma ([place])  09/01/2020 Initial Diagnosis   Pancreatic adenocarcinoma ([place])   09/01/2020 Cancer Staging   Staging form: Exocrine Pancreas, AJCC 8th Edition - Clinical stage from 09/01/2020: Stage IA (cT1, cN0, cM0) - Signed by Sindy Guadeloupe, MD on 09/01/2020 Total positive nodes: 0   09/12/2020 - 04/20/2021 Chemotherapy   Patient is on Treatment Plan : PANCREAS Modified FOLFIRINOX q14d x 4 cycles      Genetic Testing   Negative genetic testing. No pathogenic variants identified on the [employer] CancerNext-Expanded+RNA Panel. VUS in [place] identified. The report date is 10/09/2020.  The CancerNext-Expanded + RNAinsight gene panel offered by [employer] and includes sequencing and rearrangement analysis for the following 77 genes: IP, ALK, APC*, ATM*, AXIN2, BAP1, BARD1, BLM, BMPR1A, BRCA1*, BRCA2*, BRIP1*, CDC73, CDH1*,CDK4, CDKN1B, CDKN2A, CHEK2*, CTNNA1, DICER1, FANCC, FH, FLCN, GALNT12, KIF1B, LZTR1, MAX, MEN1, MET, MLH1*, MSH2*, MSH3, MSH6*, MUTYH*, NBN, NF1*, NF2, NTHL1, PALB2*, PHOX2B, PMS2*, POT1, PRKAR1A, PTCH1, PTEN*, RAD51C*, RAD51D*,RB1, RECQL, RET, SDHA, SDHAF2, SDHB, SDHC, SDHD, SMAD4, SMARCA4, SMARCB1, SMARCE1, STK11, SUFU, TMEM127, TP53*,TSC1, TSC2, VHL and XRCC2 (sequencing  and deletion/duplication); EGFR, EGLN1, HOXB13, KIT, MITF, PDGFRA, POLD1 and POLE (sequencing only); EPCAM and GREM1 (deletion/duplication only).   02/16/2021 Cancer Staging   Staging form: Exocrine Pancreas, AJCC 8th Edition - Pathologic stage from 02/16/2021: Stage IIB (pT2, pN1, cM0) - Signed by Sindy Guadeloupe, MD on 02/16/2021 Residual tumor (R): R0 - None    Interval history-patient 61 year old female who presents to symptom management clinic for complaints of lower extremity edema.  Last week she woke to find her right lower extremity, around the ankle and upper foot, red, hot, and swollen.  This was a new problem.  She saw her PCP who empirically started her on Eliquis for possible blood clot.  She had ultrasound at vein and vascular which was negative.  Clinician raise possibility of lymphadenopathy but there was no evidence of this on scan.  She was referred to cancer center for further evaluation.  She reports over the past month she has had upper abdominal pain and distention along with fullness.  She has been wearing compression socks which have improved her swelling.  She has noticed that her weight is up approximately 15 pounds.  ECOG FS:1 - Symptomatic but completely ambulatory  Review of systems- Review of Systems  Constitutional:  Positive for malaise/fatigue. Negative for chills, fever and weight loss.  HENT:  Negative for hearing loss, nosebleeds, sore throat and tinnitus.   Eyes:  Negative for blurred vision and double vision.  Respiratory:  Negative for cough, hemoptysis, shortness of breath and wheezing.   Cardiovascular:  Positive for leg swelling. Negative for chest pain and palpitations.  Gastrointestinal:  Positive for abdominal pain. Negative for blood in stool, constipation, diarrhea, melena, nausea and vomiting.  Genitourinary:  Negative for dysuria and urgency.  Musculoskeletal:  Negative  for back pain, falls, joint pain and myalgias.  Skin:  Negative for itching and  rash.  Neurological:  Negative for dizziness, tingling, sensory change, loss of consciousness, weakness and headaches.  Endo/Heme/Allergies:  Negative for environmental allergies. Does not bruise/bleed easily.  Psychiatric/Behavioral:  Negative for depression. The patient is not nervous/anxious and does not have insomnia.      Current treatment- surveillance  Allergies  Allergen Reactions   Sulfacetamide Sodium Swelling    Diarrhea,dizziness, tongue swelling   Other Other (See Comments) and Swelling    PT states that anything with the sides effects "may cause rash" she cannot take due to her psoriasis, it will make the patches on her hand so thick she can't use her hands.  Diarrhea,dizziness, tongue swelling   Oxycontin [Oxycodone Hcl] Nausea And Vomiting   Sulfa Antibiotics Swelling    Diarrhea,dizziness, tongue swelling   Abilify [Aripiprazole] Other (See Comments)   Cephalosporins Rash   Erythromycin Other (See Comments)    Abd. pain Abd. pain Other reaction(s): Other (See Comments) Abd. 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    Bipolar disorder (HCC)    Chest pain, unspecified    Chronic low back pain 10/23/2013   Chronic UTI    COPD (chronic obstructive pulmonary disease) (HCC)    Depression    Dysfunction of eustachian tube    Enlargement of lymph nodes    Family history of brain cancer    Family history of breast cancer    Family history of prostate cancer    History of Bell's palsy Right   Injury, other and unspecified, knee, leg, ankle, and foot    MRSA (methicillin resistant staph aureus) culture positive 07/11/2014   abscess   Other specified disease of hair and hair follicles    Pancreatic cancer ([place])    Pancreatic cancer ([place])     Rectal prolapse    Unspecified symptom associated with female genital organs     Past Surgical History:  Procedure Laterality Date   ABDOMINAL HYSTERECTOMY     bladder tack  2010   CHOLECYSTECTOMY  2002   FOOT SURGERY Bilateral 2004   bunion   GANGLION CYST EXCISION  2002   INCISION AND DRAINAGE ABSCESS  04-16-15   PARTIAL HYSTERECTOMY  2010   uterus removed   PORTA CATH INSERTION N/A 09/11/2020   Procedure: PORTA CATH INSERTION;  Surgeon: Algernon Huxley, MD;  Location: [facility];  Service: Cardiovascular;  Laterality: N/A;    Social History   Socioeconomic History   Marital status: Married    Spouse name: Not on file   Number of children: 3   Years of education: hs   Highest education level: Some college, no degree  Occupational History   Not on file  Tobacco Use   Smoking status: Former    Packs/day: 0.50    Years: 40.00    Total pack years: 20.00    Types: Cigarettes    Quit date: 07/14/2020    Years since quitting: 1.3   Smokeless tobacco: Never  Vaping Use   Vaping Use: Never used  Substance and Sexual Activity   Alcohol use: No    Alcohol/week: 1.0 standard drink of alcohol  Types: 1 Glasses of wine per week   Drug use: Yes    Types: Marijuana    Comment: last used 05-20-15   Sexual activity: Yes    Birth control/protection: None  Other Topics Concern   Not on file  Social History Narrative   Patient lives at home with her husband [name]).   Patient works full time.   Education CNA    Right handed.   Caffeine Tea , Soda.   Social Determinants of Health   Financial Resource Strain: High Risk (01/10/2017)   Overall Financial Resource Strain (CARDIA)    Difficulty of Paying Living Expenses: Hard  Food Insecurity: No Food Insecurity (01/10/2017)   Hunger Vital Sign    Worried About Running Out of Food in the Last Year: Never true    Ran Out of Food in the Last Year: Never true  Transportation Needs: No Transportation Needs (01/10/2017)   PRAPARE  - Hydrologist (Medical): No    Lack of Transportation (Non-Medical): No  Physical Activity: Inactive (01/10/2017)   Exercise Vital Sign    Days of Exercise per Week: 0 days    Minutes of Exercise per Session: 0 min  Stress: Stress Concern Present (01/10/2017)   [place]    Feeling of Stress : Very much  Social Connections: Somewhat Isolated (01/10/2017)   Social Connection and Isolation Panel [NHANES]    Frequency of Communication with Friends and Family: More than three times a week    Frequency of Social Gatherings with Friends and Family: More than three times a week    Attends Religious Services: More than 4 times per year    Active Member of [employer] or Organizations: No    Attends Archivist Meetings: Never    Marital Status: Separated  Intimate Partner Violence: Not At Risk (01/10/2017)   Humiliation, Afraid, Rape, and Kick questionnaire    Fear of Current or Ex-Partner: No    Emotionally Abused: No    Physically Abused: No    Sexually Abused: No    Family History  Problem Relation Age of Onset   Diabetes Father    Cancer Father        brain tumor   Depression Father    Depression Sister    Alcohol abuse Brother    Depression Brother    Leukemia Maternal Aunt    Prostate cancer Maternal Uncle    Breast cancer Paternal Aunt        dx >50   Heart disease Maternal Grandmother    Heart attack Maternal Grandmother    Stroke Maternal Grandmother    Parkinson's disease Maternal Grandfather    Leukemia Paternal Grandmother    Diabetes Paternal Grandfather    Breast cancer Cousin    Brain cancer Niece        dx under 50     Current Outpatient Medications:    albuterol (VENTOLIN HFA) 108 (90 Base) MCG/ACT inhaler, TAKE 2 PUFFS BY MOUTH EVERY 6 HOURS AS NEEDED FOR WHEEZE OR SHORTNESS OF BREATH, Disp: 8 each, Rfl: 4   calcium-vitamin D (OSCAL WITH D) 500-5 MG-MCG tablet,  Take 1 tablet by mouth daily., Disp: , Rfl:    divalproex (DEPAKOTE) 250 MG DR tablet, Take 1 tablet (250 mg total) by mouth 2 (two) times daily., Disp: 180 tablet, Rfl: 1   famotidine (PEPCID) 20 MG tablet, Take 1 tablet by mouth 2 (two) times daily.,  Disp: , Rfl:    ferrous sulfate 325 (65 FE) MG tablet, Take 325 mg by mouth 2 (two) times daily with a meal., Disp: , Rfl:    fluticasone (FLONASE) 50 MCG/ACT nasal spray, Place 2 sprays into both nostrils as needed for allergies or rhinitis., Disp: , Rfl:    loratadine (CLARITIN) 10 MG tablet, Take 10 mg by mouth daily., Disp: , Rfl:    Multiple Vitamin (MULTIVITAMIN) tablet, Take 1 tablet by mouth daily., Disp: , Rfl:    potassium chloride SA (KLOR-CON M20) 20 MEQ tablet, Take 1 tablet (20 mEq total) by mouth 2 (two) times daily., Disp: 90 tablet, Rfl: 1   propranolol (INDERAL) 20 MG tablet, Take 1 tablet (20 mg total) by mouth 3 (three) times daily., Disp: 270 tablet, Rfl: 1   Ascorbic Acid (VITAMIN C) 1000 MG tablet, Take 1,000 mg by mouth daily. (Patient not taking: Reported on 11/02/2021), Disp: , Rfl:    loperamide (IMODIUM A-D) 2 MG tablet, Take 2 at onset of diarrhea, then 1 every 2hrs until 12hr without a BM. May take 2 tab every 4hrs at bedtime. If diarrhea recurs repeat. (Patient not taking: Reported on 11/02/2021), Disp: 100 tablet, Rfl: 1 No current facility-administered medications for this visit.  Facility-Administered Medications Ordered in Other Visits:    heparin lock flush 100 UNIT/ML injection, , , ,    heparin lock flush 100 unit/mL, 500 Units, Intravenous, Once, Sindy Guadeloupe, MD   heparin lock flush 100 unit/mL, 500 Units, Intravenous, Once, Sindy Guadeloupe, MD   sodium chloride flush (NS) 0.9 % injection 10 mL, 10 mL, Intracatheter, PRN, Sindy Guadeloupe, MD, 10 mL at 11/03/20 1441   sodium chloride flush (NS) 0.9 % injection 10 mL, 10 mL, Intravenous, PRN, Sindy Guadeloupe, MD  Physical exam:  Vitals:   11/02/21 0905  BP: (!)  109/51  Pulse: 90  Resp: 16  Temp: (!) 97.2 F (36.2 C)  TempSrc: Tympanic  SpO2: 100%  Weight: 112 lb (50.8 kg)   Physical Exam Constitutional:      General: She is not in acute distress.    Appearance: She is well-developed. She is not ill-appearing.     Comments: Thin build  HENT:     Mouth/Throat:     Pharynx: No oropharyngeal exudate.  Eyes:     General: No scleral icterus. Cardiovascular:     Rate and Rhythm: Normal rate and regular rhythm.  Pulmonary:     Effort: Pulmonary effort is normal.     Breath sounds: Normal breath sounds.  Abdominal:     General: Bowel sounds are normal. There is distension.     Palpations: Abdomen is soft. There is no mass.     Tenderness: There is abdominal tenderness. There is no right CVA tenderness, left CVA tenderness, guarding or rebound.     Hernia: No hernia is present.  Musculoskeletal:        General: No tenderness or deformity. Normal range of motion.     Cervical back: Normal range of motion and neck supple.     Right lower leg: Edema (1+ pitting) present.     Left lower leg: Edema (1+ nonpitting generalized) present.  Skin:    General: Skin is warm and dry.     Coloration: Skin is not jaundiced or pale.     Findings: Bruising (mild bruising at toes, upper foot, and behind knee of RLE.) present. No erythema, lesion or rash.  Neurological:  Mental Status: She is alert and oriented to person, place, and time.  Psychiatric:        Mood and Affect: Mood normal.        Behavior: Behavior normal.         Latest Ref Rng & Units 09/29/2021    9:15 AM  CMP  Glucose 70 - 99 mg/dL 151   BUN 6 - 20 mg/dL 18   Creatinine 0.44 - 1.00 mg/dL 0.91   Sodium 135 - 145 mmol/L 140   Potassium 3.5 - 5.1 mmol/L 3.3   Chloride 98 - 111 mmol/L 109   CO2 22 - 32 mmol/L 23   Calcium 8.9 - 10.3 mg/dL 8.8   Total Protein 6.5 - 8.1 g/dL 6.8   Total Bilirubin 0.3 - 1.2 mg/dL 0.3   Alkaline Phos 38 - 126 U/L 103   AST 15 - 41 U/L 43   ALT  0 - 44 U/L 32       Latest Ref Rng & Units 09/29/2021    9:15 AM  CBC  WBC 4.0 - 10.5 K/uL 6.4   Hemoglobin 12.0 - 15.0 g/dL 10.5   Hematocrit 36.0 - 46.0 % 32.3   Platelets 150 - 400 K/uL 230    07/31/21-  Cancer Antigen 19-9 (CA 19-9) <=40 U/mL 31    Component Ref Range & Units 3 mo ago 5 mo ago 8 mo ago 11 mo ago 1 yr ago  CA 19-9 0 - 35 U/mL 32  34 CM  23 CM  49 High  CM  46 High     No images are attached to the encounter.  VAS [place] LOWER EXTREMITY VENOUS (DVT)  Result Date: 10/30/2021  Lower Venous DVT Study Patient Name:  JENNICE RENEGAR  Date of Exam:   10/28/2021 Medical Rec #: [id number]         Accession #:    [id number] Date of Birth: [birth year]         Patient Gender: F Patient Age:   1 years Exam Location:  [facility] Procedure:      VAS [place] LOWER EXTREMITY VENOUS (DVT) Referring Phys: Eulogio Ditch --------------------------------------------------------------------------------  Indications: Swelling, and right. Other Indications: Has been on chemo. Performing Technologist: Concha [place] RVT  Examination Guidelines: A complete evaluation includes B-mode imaging, spectral Doppler, color Doppler, and power Doppler as needed of all accessible portions of each vessel. Bilateral testing is considered an integral part of a complete examination. Limited examinations for reoccurring indications may be performed as noted. The reflux portion of the exam is performed with the patient in reverse Trendelenburg.  +---------+---------------+---------+-----------+----------+--------------+ RIGHT    CompressibilityPhasicitySpontaneityPropertiesThrombus Aging +---------+---------------+---------+-----------+----------+--------------+ CFV      Full           Yes      Yes                                 +---------+---------------+---------+-----------+----------+--------------+ SFJ      Full           Yes      Yes                                  +---------+---------------+---------+-----------+----------+--------------+ FV Prox  Full                                                        +---------+---------------+---------+-----------+----------+--------------+  FV Mid   Full           Yes      Yes                                 +---------+---------------+---------+-----------+----------+--------------+ FV DistalFull                                                        +---------+---------------+---------+-----------+----------+--------------+ PFV      Full           Yes      Yes                                 +---------+---------------+---------+-----------+----------+--------------+ POP      Full           Yes      Yes                                 +---------+---------------+---------+-----------+----------+--------------+ PTV      Full           Yes      Yes                                 +---------+---------------+---------+-----------+----------+--------------+ PERO     Full           Yes      Yes                                 +---------+---------------+---------+-----------+----------+--------------+ Gastroc  Full           Yes      Yes                                 +---------+---------------+---------+-----------+----------+--------------+ GSV      Full           Yes      Yes                                 +---------+---------------+---------+-----------+----------+--------------+ SSV      Full           Yes      Yes                                 +---------+---------------+---------+-----------+----------+--------------+   +----+---------------+---------+-----------+----------+--------------+ LEFTCompressibilityPhasicitySpontaneityPropertiesThrombus Aging +----+---------------+---------+-----------+----------+--------------+ CFV Full           Yes      Yes                                  +----+---------------+---------+-----------+----------+--------------+ SFJ Full           Yes      Yes                                 +----+---------------+---------+-----------+----------+--------------+  Summary: RIGHT: - No evidence of deep vein thrombosis in the lower extremity. No indirect evidence of obstruction proximal to the inguinal ligament.  LEFT: - No evidence of common femoral vein obstruction.  *See table(s) above for measurements and observations. Electronically signed by Leotis Pain MD on 10/30/2021 at 11:01:15 AM.    Final     Assessment and plan- Patient is a 61 y.o. female   PCP started eliquis 5 mg PO BID empirically for clinical dvt. Pt was referred to vascular. Ultrasound was performed which was negative for dvt and patient stopped eliquis. I reviewed this imaging and agree with findings. Per patient, she was told to wear compression socks and trial conservative measures.   On exam, clinically no evidence of dvt. No redness, mild edema r > l. No inguinal lymphadenopathy however, abdominal fullness, possible ascites, and diffusely tender. Patient reports upper abdominal pain for last few weeks. Recommend cbc, cmp, ca 19-9 and ct abdomen-pelvis w contrast to evaluate further.   Follow up based onr esults. Continue conservative measures for leg swelling.   ---- addendum: ct consistent with hepatic steatosis. Will refer to GI. Patient requests [facility] Blazer at [facility]. Referral sent. --   Visit Diagnosis 1. Pain and swelling of right lower leg   2. Pancreatic adenocarcinoma [facility])     Patient expressed understanding and was in agreement with this plan. She also understands that She can call clinic at any time with any questions, concerns, or complaints.   Thank you for allowing me to participate in the care of this very pleasant patient.   Beckey Rutter, DNP, AGNP-C [facility]  CC: Dr. Lavera Guise, Dr. Janese Banks

## 2021-11-03 ENCOUNTER — Ambulatory Visit
Admission: RE | Admit: 2021-11-03 | Discharge: 2021-11-03 | Disposition: A | Discharge status: Home / Self Care | Payer: BC Managed Care – PPO | Source: Ambulatory Visit | Attending: Nurse Practitioner | Admitting: Nurse Practitioner

## 2021-11-03 DIAGNOSIS — N3289 Other specified disorders of bladder: Secondary | ICD-10-CM | POA: Diagnosis not present

## 2021-11-03 DIAGNOSIS — C259 Malignant neoplasm of pancreas, unspecified: Secondary | ICD-10-CM | POA: Insufficient documentation

## 2021-11-03 DIAGNOSIS — K760 Fatty (change of) liver, not elsewhere classified: Secondary | ICD-10-CM | POA: Diagnosis not present

## 2021-11-03 MED ORDER — IOHEXOL 300 MG/ML  SOLN
100.0000 mL | Freq: Once | INTRAMUSCULAR | Status: AC | PRN
  Administered 2021-11-03: 100 mL via INTRAVENOUS

## 2021-11-04 ENCOUNTER — Telehealth: Payer: Self-pay | Admitting: *Deleted

## 2021-11-04 ENCOUNTER — Encounter: Payer: Self-pay | Admitting: Psychiatry

## 2021-11-04 ENCOUNTER — Other Ambulatory Visit: Payer: Self-pay | Admitting: Nurse Practitioner

## 2021-11-04 ENCOUNTER — Encounter: Payer: Self-pay | Admitting: Nurse Practitioner

## 2021-11-04 ENCOUNTER — Ambulatory Visit: Payer: BC Managed Care – PPO | Admitting: Psychiatry

## 2021-11-04 VITALS — BP 120/58 | HR 81 | Temp 98.3°F | Wt 113.0 lb

## 2021-11-04 DIAGNOSIS — F3178 Bipolar disorder, in full remission, most recent episode mixed: Secondary | ICD-10-CM

## 2021-11-04 DIAGNOSIS — K760 Fatty (change of) liver, not elsewhere classified: Secondary | ICD-10-CM

## 2021-11-04 DIAGNOSIS — F603 Borderline personality disorder: Secondary | ICD-10-CM

## 2021-11-04 DIAGNOSIS — F411 Generalized anxiety disorder: Secondary | ICD-10-CM

## 2021-11-04 DIAGNOSIS — F1221 Cannabis dependence, in remission: Secondary | ICD-10-CM

## 2021-11-04 DIAGNOSIS — F17200 Nicotine dependence, unspecified, uncomplicated: Secondary | ICD-10-CM | POA: Diagnosis not present

## 2021-11-04 NOTE — Progress Notes (Signed)
Patient presented to clinic requesting results. Appears that patient has seen her mychart results. Has not listened to vm I left her. I printed a copy of her ct scan. Recommended she contact her GI doctor for follow up of new hepatic steatosis. No pancreatic mass seen.

## 2021-11-04 NOTE — Progress Notes (Signed)
Sheryl Perez OP Progress Note  11/04/2021 5:02 PM [name]  MRN:  [id number]  Chief Complaint:  Chief Complaint  Patient presents with   Follow-up   Medication Refill   Depression   Anxiety   HPI: Sheryl Perez is a 61 year old Caucasian female, has a history of bipolar disorder, borderline personality disorder, cannabis use disorder in remission, GAD, married, lives in [place] was evaluated in office today.  Patient with history of pancreatic adenocarcinoma status post definitive Whipple surgery.  Patient today reports that her cancer is currently in remission.  She reports she however currently has to follow up with the gastroenterologist for possible hepatic steatosis per recent CT scan report.  She looks forward to that.  Does have swelling of her abdomen on and off, unknown what could be contributing to this.  Currently denies any pain.  Patient today appeared to be pleasant, alert, oriented to person place time situation.  Reports relationship struggles with her spouse, however not sure if she wants to move out yet.  That could cause financial constraints.  Patient reports she is currently compliant on the medications like Depakote.  Denies side effects.  Denies any significant sadness or anxiety symptoms.  Denies any suicidality, homicidality or perceptual disturbances.  Continues to stay away from smoking cigarettes.  Denies any other concerns today.  Visit Diagnosis:    ICD-10-CM   1. Bipolar disorder, in full remission, most recent episode mixed ([place])  F31.78     2. GAD (generalized anxiety disorder)  F41.1     3. Tobacco use disorder  F17.200    in remission    4. Borderline personality disorder ([place])  F60.3     5. Cannabis use disorder, moderate, in early remission ([place])  F12.21       Past Psychiatric History: Reviewed past psychiatric history from progress note on 04/12/2017.  Past trials of risperidone, Abilify, Lamictal, Prozac, trazodone,  lithium.  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    Bipolar disorder ([place])    Chest pain, unspecified    Chronic low back pain 10/23/2013   Chronic UTI    COPD (chronic obstructive pulmonary disease) (HCC)    Depression    Dysfunction of eustachian tube    Enlargement of lymph nodes    Family history of brain cancer    Family history of breast cancer    Family history of prostate cancer    History of Bell's palsy Right   Injury, other and unspecified, knee, leg, ankle, and foot    MRSA (methicillin resistant staph aureus) culture positive 07/11/2014   abscess   Other specified disease of hair and hair follicles    Pancreatic cancer ([place])    Pancreatic cancer ([place])    Rectal prolapse    Unspecified symptom associated with female genital organs     Past Surgical History:  Procedure Laterality Date   ABDOMINAL HYSTERECTOMY     bladder tack  2010   CHOLECYSTECTOMY  2002   FOOT SURGERY Bilateral 2004   bunion   GANGLION CYST EXCISION  2002   INCISION AND DRAINAGE ABSCESS  04-16-15   PARTIAL HYSTERECTOMY  2010   uterus removed   PORTA CATH INSERTION N/A 09/11/2020   Procedure: PORTA CATH INSERTION;  Surgeon: Algernon Huxley, Perez;  Location: [facility];  Service: Cardiovascular;  Laterality: N/A;    Family  Psychiatric History: Reviewed family psychiatric history from progress note on 04/12/2017.  Family History:  Family History  Problem Relation Age of Onset   Diabetes Father    Cancer Father        brain tumor   Depression Father    Depression Sister    Alcohol abuse Brother    Depression Brother    Leukemia Maternal Aunt    Prostate cancer Maternal Uncle    Breast cancer Paternal Aunt        dx >50   Heart disease Maternal Grandmother    Heart attack Maternal Grandmother    Stroke Maternal Grandmother    Parkinson's disease Maternal Grandfather    Leukemia Paternal  Grandmother    Diabetes Paternal Grandfather    Breast cancer Cousin    Brain cancer Niece        dx under 67    Social History: Reviewed social history from progress note on 04/12/2017. Social History   Socioeconomic History   Marital status: Married    Spouse name: Not on file   Number of children: 3   Years of education: hs   Highest education level: Some college, no degree  Occupational History   Not on file  Tobacco Use   Smoking status: Former    Packs/day: 0.50    Years: 40.00    Total pack years: 20.00    Types: Cigarettes    Quit date: 07/14/2020    Years since quitting: 1.3   Smokeless tobacco: Never  Vaping Use   Vaping Use: Never used  Substance and Sexual Activity   Alcohol use: No    Alcohol/week: 1.0 standard drink of alcohol    Types: 1 Glasses of wine per week   Drug use: Yes    Types: Marijuana    Comment: last used 05-20-15   Sexual activity: Yes    Birth control/protection: None  Other Topics Concern   Not on file  Social History Narrative   Patient lives at home with her husband [name]).   Patient works full time.   Education CNA    Right handed.   Caffeine Tea , Soda.   Social Determinants of Health   Financial Resource Strain: High Risk (01/10/2017)   Overall Financial Resource Strain (CARDIA)    Difficulty of Paying Living Expenses: Hard  Food Insecurity: No Food Insecurity (01/10/2017)   Hunger Vital Sign    Worried About Running Out of Food in the Last Year: Never true    Ran Out of Food in the Last Year: Never true  Transportation Needs: No Transportation Needs (01/10/2017)   PRAPARE - Hydrologist (Medical): No    Lack of Transportation (Non-Medical): No  Physical Activity: Inactive (01/10/2017)   Exercise Vital Sign    Days of Exercise per Week: 0 days    Minutes of Exercise per Session: 0 min  Stress: Stress Concern Present (01/10/2017)   [place]    Feeling of Stress : Very much  Social Connections: Somewhat Isolated (01/10/2017)   Social Connection and Isolation Panel [NHANES]    Frequency of Communication with Friends and Family: More than three times a week    Frequency of Social Gatherings with Friends and Family: More than three times a week    Attends Religious Services: More than 4 times per year    Active Member of [employer] or Organizations: No    Attends Club or  Organization Meetings: Never    Marital Status: Separated    Allergies:  Allergies  Allergen Reactions   Sulfacetamide Sodium Swelling    Diarrhea,dizziness, tongue swelling   Other Other (See Comments) and Swelling    PT states that anything with the sides effects "may cause rash" she cannot take due to her psoriasis, it will make the patches on her hand so thick she can't use her hands.  Diarrhea,dizziness, tongue swelling   Oxycontin [Oxycodone Hcl] Nausea And Vomiting   Sulfa Antibiotics Swelling    Diarrhea,dizziness, tongue swelling   Abilify [Aripiprazole] Other (See Comments)   Cephalosporins Rash   Erythromycin Other (See Comments)    Abd. pain Abd. pain Other reaction(s): Other (See Comments) Abd. pain   Lamotrigine Rash and Other (See Comments)    psorasis worsen psorasis worsen   Risperidone Other (See Comments)    tremors tremors tremors    Metabolic Disorder Labs: Lab Results  Component Value Date   HGBA1C 5.3 08/24/2018   MPG 105.41 08/24/2018   MPG 105.41 04/14/2017   Lab Results  Component Value Date   PROLACTIN 9.3 08/24/2018   PROLACTIN 12.5 04/14/2017   Lab Results  Component Value Date   CHOL 197 12/12/2019   TRIG 68 08/04/2020   HDL 51 12/12/2019   CHOLHDL 3.9 12/12/2019   VLDL 37 08/24/2018   LDLCALC 123 (H) 12/12/2019   LDLCALC 107 (H) 08/24/2018   Lab Results  Component Value Date   TSH 1.241 06/16/2021   TSH 1.27 12/12/2019    Therapeutic Level Labs: Lab Results  Component Value Date    LITHIUM 0.34 (L) 04/14/2017   LITHIUM 0.3 (L) 06/15/2016   Lab Results  Component Value Date   VALPROATE 25 (L) 11/19/2020   VALPROATE 17 (L) 02/22/2020   No results found for: "CBMZ"  Current Medications: Current Outpatient Medications  Medication Sig Dispense Refill   albuterol (VENTOLIN HFA) 108 (90 Base) MCG/ACT inhaler TAKE 2 PUFFS BY MOUTH EVERY 6 HOURS AS NEEDED FOR WHEEZE OR SHORTNESS OF BREATH 8 each 4   Ascorbic Acid (VITAMIN C) 1000 MG tablet Take 1,000 mg by mouth daily.     calcium-vitamin D (OSCAL WITH D) 500-5 MG-MCG tablet Take 1 tablet by mouth daily.     divalproex (DEPAKOTE) 250 MG DR tablet Take 1 tablet (250 mg total) by mouth 2 (two) times daily. 180 tablet 1   famotidine (PEPCID) 20 MG tablet Take 1 tablet by mouth 2 (two) times daily.     ferrous sulfate 325 (65 FE) MG tablet Take 325 mg by mouth 2 (two) times daily with a meal.     fluticasone (FLONASE) 50 MCG/ACT nasal spray Place 2 sprays into both nostrils as needed for allergies or rhinitis.     loperamide (IMODIUM A-D) 2 MG tablet Take 2 at onset of diarrhea, then 1 every 2hrs until 12hr without a BM. May take 2 tab every 4hrs at bedtime. If diarrhea recurs repeat. 100 tablet 1   loratadine (CLARITIN) 10 MG tablet Take 10 mg by mouth daily.     Multiple Vitamin (MULTIVITAMIN) tablet Take 1 tablet by mouth daily.     potassium chloride SA (KLOR-CON M20) 20 MEQ tablet Take 1 tablet (20 mEq total) by mouth 2 (two) times daily. 90 tablet 1   propranolol (INDERAL) 20 MG tablet Take 1 tablet (20 mg total) by mouth 3 (three) times daily. 270 tablet 1   No current facility-administered medications for this visit.  Facility-Administered Medications Ordered in Other Visits  Medication Dose Route Frequency Provider Last Rate Last Admin   heparin lock flush 100 UNIT/ML injection            heparin lock flush 100 unit/mL  500 Units Intravenous Once Sindy Guadeloupe, Perez       heparin lock flush 100 unit/mL  500 Units  Intravenous Once Sindy Guadeloupe, Perez       sodium chloride flush (NS) 0.9 % injection 10 mL  10 mL Intracatheter PRN Sindy Guadeloupe, Perez   10 mL at 11/03/20 1441   sodium chloride flush (NS) 0.9 % injection 10 mL  10 mL Intravenous PRN Sindy Guadeloupe, Perez         Musculoskeletal: Strength & Muscle Tone: within normal limits Gait & Station: normal Patient leans: N/A  Psychiatric Specialty Exam: Review of Systems  Gastrointestinal:  Positive for abdominal distention.  Psychiatric/Behavioral: Negative.    All other systems reviewed and are negative.   Blood pressure (!) 120/58, pulse 81, temperature 98.3 F (36.8 C), temperature source Temporal, weight 113 lb (51.3 kg), last menstrual period 02/09/2008.Body mass index is 22.07 kg/m.  General Appearance: Casual  Eye Contact:  Fair  Speech:  Clear and Coherent  Volume:  Normal  Mood:  Euthymic  Affect:  Congruent  Thought Process:  Goal Directed and Descriptions of Associations: Intact  Orientation:  Full (Time, Place, and Person)  Thought Content: Logical   Suicidal Thoughts:  No  Homicidal Thoughts:  No  Memory:  Immediate;   Fair Recent;   Fair Remote;   Fair  Judgement:  Fair  Insight:  Fair  Psychomotor Activity:  Normal  Concentration:  Concentration: Fair and Attention Span: Fair  Recall:  [employer] of Knowledge: Fair  Language: Fair  Akathisia:  No  Handed:  Right  AIMS (if indicated): not done  Assets:  Communication Skills Desire for Improvement Housing Social Support  ADL's:  Intact  Cognition: WNL  Sleep:  Fair   Screenings: [place] Office Visit from 07/17/2021 in [place] Video Visit from 04/21/2020 in [place] Office Visit from 05/03/2016 in [place] Admission (Discharged) from 01/15/2015 in [place] Total Score 0 0 0 0      AUDIT    Flowsheet Row Admission  (Discharged) from 01/15/2015 in [place]  Alcohol Use Disorder Identification Test Final Score (AUDIT) 1      GAD-7    Flowsheet Row Office Visit from 11/04/2021 in [place] Video Visit from 09/08/2020 in [place] Visit from 08/19/2020 in [place] Office Visit from 04/18/2015 in [facility]  Total GAD-7 Score 0 '6 2 14      '$ PHQ2-9    [place] Office Visit from 11/04/2021 in [place] Office Visit from 07/17/2021 in [place] Office Visit from 07/16/2021 in [place] Office Visit from 12/23/2020 in [place] Office Visit from 11/25/2020 in [place]  PHQ-2 Total Score 0 1 0 0 1  PHQ-9 Total Score -- 4 0 -- 3      [place] Visit from 11/04/2021 in [place] Office Visit from 07/17/2021 in [place] Office Visit from 11/25/2020 in [place] No Risk  No Risk No Risk        Assessment and Plan: Sheryl Perez is a 61 year old Caucasian female who has a history of bipolar disorder, cannabis use disorder, tobacco use disorder, psoriatic arthritis, pancreatic adenocarcinoma was evaluated in office today.  Patient is currently stable however since she has possible hepatic problems, advised she could reduce the Depakote to once a day until she follows up with gastroenterologist.  Plan as noted below.  Plan Bipolar disorder in remission Patient advised to reduce the Depakote 250 mg to once a day for now.  She will follow-up with gastroenterologist for further instructions. Depakote level-11/19/2020 -25-subtherapeutic  GAD-stable Depakote 250 mg p.o. reduced to once a day for now. Propranolol 20 mg p.o. 3 times  daily  Cannabis use disorder in remission Will monitor closely  High risk medication use-will order Depakote level once she has follow-up with gastroenterology. Reviewed labs-11/02/2021-sodium level-138-within normal limits, AST/ALT-within normal limits however alkaline phosphatase slightly elevated at 130. CBC reviewed-platelet count-within normal limits.   Follow-up in clinic in 2 months or sooner if needed.  This note was generated in part or whole with voice recognition software. Voice recognition is usually quite accurate but there are transcription errors that can and very often do occur. I apologize for any typographical errors that were not detected and corrected.         Ursula Alert, Perez 11/04/2021, 5:02 PM

## 2021-11-04 NOTE — Telephone Encounter (Signed)
Call returned to patient, she had not listened to her voice mail yet. I explained per Beckey Rutter, NP that no cancer seen, and plan is to refer her to GI regarding the hepatic Steatosis. She said she wants referral to Laurine Blazer at [facility] and to have them call her with appointment as she does not use My Chart

## 2021-11-04 NOTE — Telephone Encounter (Signed)
Patient called twice yesterday wanting results of her CT of which results ere not back yet. Results are now available She said she cannot use My Chart and would like a return call to tell her results. Her next appointment is 10/16 and she does not want to wait until then  IMPRESSION: 1. No acute intra-abdominal pathology identified. No definite radiographic explanation for the patient's reported symptoms. 2. Interval development of severe hepatic steatosis. 3. Surgical changes of Whipple resection. No evidence of residual or recurrent disease within the abdomen and pelvis. 4. Moderate colonic stool burden without evidence of obstruction.   Aortic Atherosclerosis (ICD10-I70.0).     Electronically Signed   By: Fidela Salisbury M.D.   On: 11/04/2021 01:57

## 2021-11-05 ENCOUNTER — Telehealth: Payer: Self-pay | Admitting: *Deleted

## 2021-11-05 ENCOUNTER — Telehealth: Payer: Self-pay

## 2021-11-05 ENCOUNTER — Encounter: Payer: Self-pay | Admitting: Oncology

## 2021-11-05 LAB — CANCER ANTIGEN 19-9: CA 19-9: 24 U/mL (ref 0–35)

## 2021-11-05 NOTE — Telephone Encounter (Signed)
Patient called requesting a letter allowing her to return to work tomorrow is possible and it needs to state that she can return to work sitting or standing as needed and that she is not to lift anything greater that 10 Lbs until further evaluation. She is asking that we email letter to her at [email or web address] then call her and let her know it was emailed. She cannot use MyCHart, cannot figure out how to use it

## 2021-11-05 NOTE — Telephone Encounter (Signed)
GI referral faxed to [facility] on this date. Fax transmission confirmation received.

## 2021-11-05 NOTE — Telephone Encounter (Signed)
Letter sent per pt request. Pt informed.

## 2021-11-06 ENCOUNTER — Ambulatory Visit: Payer: BC Managed Care – PPO | Admitting: Psychiatry

## 2021-11-11 ENCOUNTER — Other Ambulatory Visit: Payer: Self-pay | Admitting: Gastroenterology

## 2021-11-11 DIAGNOSIS — R300 Dysuria: Secondary | ICD-10-CM | POA: Diagnosis not present

## 2021-11-11 DIAGNOSIS — R932 Abnormal findings on diagnostic imaging of liver and biliary tract: Secondary | ICD-10-CM | POA: Diagnosis not present

## 2021-11-16 ENCOUNTER — Telehealth: Payer: Self-pay | Admitting: Psychiatry

## 2021-11-16 ENCOUNTER — Ambulatory Visit
Admission: RE | Admit: 2021-11-16 | Discharge: 2021-11-16 | Disposition: A | Discharge status: Home / Self Care | Payer: BC Managed Care – PPO | Source: Ambulatory Visit | Attending: Gastroenterology | Admitting: Gastroenterology

## 2021-11-16 DIAGNOSIS — Z79899 Other long term (current) drug therapy: Secondary | ICD-10-CM

## 2021-11-16 DIAGNOSIS — F3178 Bipolar disorder, in full remission, most recent episode mixed: Secondary | ICD-10-CM

## 2021-11-16 DIAGNOSIS — K760 Fatty (change of) liver, not elsewhere classified: Secondary | ICD-10-CM | POA: Diagnosis not present

## 2021-11-16 DIAGNOSIS — R932 Abnormal findings on diagnostic imaging of liver and biliary tract: Secondary | ICD-10-CM | POA: Insufficient documentation

## 2021-11-16 NOTE — Telephone Encounter (Signed)
Spoke to patient

## 2021-11-16 NOTE — Telephone Encounter (Signed)
Patient stopped by the office. States that she needs you to order lab work. You stated that if she needed this to let you know since there was medication adjustment at last visit. She already had her CT scan on liver this morning and stopped to ask about labs. Please advise and call at [phone]

## 2021-11-16 NOTE — Telephone Encounter (Signed)
Returned call to patient.  She had liver function done recently-2 weeks ago.  She is currently following up with providers to make sure she does not have any problems with her liver.  Will order Depakote level today.  Patient however has not been taking the Depakote every day.  Advised her to take the Depakote daily at least for 4 to 5 days and then go to the lab fasting in the morning before taking her morning dose of Depakote.  Lab order is in the system for [facility] lab.  Patient voiced understanding.

## 2021-11-17 ENCOUNTER — Other Ambulatory Visit: Payer: Self-pay | Admitting: *Deleted

## 2021-11-17 DIAGNOSIS — R9389 Abnormal findings on diagnostic imaging of other specified body structures: Secondary | ICD-10-CM

## 2021-11-19 ENCOUNTER — Telehealth: Payer: Self-pay | Admitting: *Deleted

## 2021-11-19 NOTE — Telephone Encounter (Signed)
Per the pt. She had saw lauren for leg swelling and was sent to have a ct scan and per pt. There was no cause for the swelling per scan. She is now going to heart doctor to see if that could be a problem and wants to bring by a form for work in regards to this swelling issue.I told her that I would have to look at it. It takes several days to get paper work done. She agrees

## 2021-11-23 ENCOUNTER — Inpatient Hospital Stay: Payer: BC Managed Care – PPO

## 2021-11-23 ENCOUNTER — Encounter: Payer: Self-pay | Admitting: Oncology

## 2021-11-23 ENCOUNTER — Inpatient Hospital Stay: Payer: BC Managed Care – PPO | Attending: Oncology

## 2021-11-23 ENCOUNTER — Inpatient Hospital Stay (HOSPITAL_BASED_OUTPATIENT_CLINIC_OR_DEPARTMENT_OTHER): Payer: BC Managed Care – PPO | Admitting: Oncology

## 2021-11-23 VITALS — BP 117/46 | HR 80 | Temp 97.9°F | Resp 16 | Wt 108.0 lb

## 2021-11-23 DIAGNOSIS — K589 Irritable bowel syndrome without diarrhea: Secondary | ICD-10-CM | POA: Diagnosis not present

## 2021-11-23 DIAGNOSIS — Z881 Allergy status to other antibiotic agents status: Secondary | ICD-10-CM | POA: Insufficient documentation

## 2021-11-23 DIAGNOSIS — Z8042 Family history of malignant neoplasm of prostate: Secondary | ICD-10-CM | POA: Insufficient documentation

## 2021-11-23 DIAGNOSIS — Z818 Family history of other mental and behavioral disorders: Secondary | ICD-10-CM | POA: Insufficient documentation

## 2021-11-23 DIAGNOSIS — Z803 Family history of malignant neoplasm of breast: Secondary | ICD-10-CM | POA: Insufficient documentation

## 2021-11-23 DIAGNOSIS — Z882 Allergy status to sulfonamides status: Secondary | ICD-10-CM | POA: Insufficient documentation

## 2021-11-23 DIAGNOSIS — Z811 Family history of alcohol abuse and dependence: Secondary | ICD-10-CM | POA: Diagnosis not present

## 2021-11-23 DIAGNOSIS — Z5986 Financial insecurity: Secondary | ICD-10-CM | POA: Insufficient documentation

## 2021-11-23 DIAGNOSIS — Z87440 Personal history of urinary (tract) infections: Secondary | ICD-10-CM | POA: Insufficient documentation

## 2021-11-23 DIAGNOSIS — Z8249 Family history of ischemic heart disease and other diseases of the circulatory system: Secondary | ICD-10-CM | POA: Insufficient documentation

## 2021-11-23 DIAGNOSIS — Z888 Allergy status to other drugs, medicaments and biological substances status: Secondary | ICD-10-CM | POA: Insufficient documentation

## 2021-11-23 DIAGNOSIS — Z9049 Acquired absence of other specified parts of digestive tract: Secondary | ICD-10-CM | POA: Insufficient documentation

## 2021-11-23 DIAGNOSIS — Z823 Family history of stroke: Secondary | ICD-10-CM | POA: Diagnosis not present

## 2021-11-23 DIAGNOSIS — Z806 Family history of leukemia: Secondary | ICD-10-CM | POA: Insufficient documentation

## 2021-11-23 DIAGNOSIS — C253 Malignant neoplasm of pancreatic duct: Secondary | ICD-10-CM | POA: Insufficient documentation

## 2021-11-23 DIAGNOSIS — R1012 Left upper quadrant pain: Secondary | ICD-10-CM | POA: Insufficient documentation

## 2021-11-23 DIAGNOSIS — F319 Bipolar disorder, unspecified: Secondary | ICD-10-CM | POA: Diagnosis not present

## 2021-11-23 DIAGNOSIS — C259 Malignant neoplasm of pancreas, unspecified: Secondary | ICD-10-CM

## 2021-11-23 DIAGNOSIS — Z79899 Other long term (current) drug therapy: Secondary | ICD-10-CM | POA: Insufficient documentation

## 2021-11-23 DIAGNOSIS — R1013 Epigastric pain: Secondary | ICD-10-CM | POA: Insufficient documentation

## 2021-11-23 DIAGNOSIS — Z95828 Presence of other vascular implants and grafts: Secondary | ICD-10-CM

## 2021-11-23 DIAGNOSIS — Z885 Allergy status to narcotic agent status: Secondary | ICD-10-CM | POA: Diagnosis not present

## 2021-11-23 DIAGNOSIS — F1290 Cannabis use, unspecified, uncomplicated: Secondary | ICD-10-CM | POA: Insufficient documentation

## 2021-11-23 DIAGNOSIS — Z833 Family history of diabetes mellitus: Secondary | ICD-10-CM | POA: Diagnosis not present

## 2021-11-23 DIAGNOSIS — Z808 Family history of malignant neoplasm of other organs or systems: Secondary | ICD-10-CM | POA: Diagnosis not present

## 2021-11-23 DIAGNOSIS — E876 Hypokalemia: Secondary | ICD-10-CM

## 2021-11-23 LAB — COMPREHENSIVE METABOLIC PANEL
ALT: 17 U/L (ref 0–44)
AST: 28 U/L (ref 15–41)
Albumin: 3.6 g/dL (ref 3.5–5.0)
Alkaline Phosphatase: 147 U/L — ABNORMAL HIGH (ref 38–126)
Anion gap: 4 — ABNORMAL LOW (ref 5–15)
BUN: 9 mg/dL (ref 8–23)
CO2: 23 mmol/L (ref 22–32)
Calcium: 8.5 mg/dL — ABNORMAL LOW (ref 8.9–10.3)
Chloride: 110 mmol/L (ref 98–111)
Creatinine, Ser: 0.85 mg/dL (ref 0.44–1.00)
GFR, Estimated: >60 mL/min (ref >60)
Glucose, Bld: 125 mg/dL — ABNORMAL HIGH (ref 70–99)
Potassium: 2.9 mmol/L — ABNORMAL LOW (ref 3.5–5.1)
Sodium: 137 mmol/L (ref 135–145)
Total Bilirubin: 0.4 mg/dL (ref 0.3–1.2)
Total Protein: 7 g/dL (ref 6.5–8.1)

## 2021-11-23 LAB — CBC WITH DIFFERENTIAL/PLATELET
Abs Immature Granulocytes: 0.01 10*3/uL (ref 0.00–0.07)
Basophils Absolute: 0 10*3/uL (ref 0.0–0.1)
Basophils Relative: 0 %
Eosinophils Absolute: 0.1 10*3/uL (ref 0.0–0.5)
Eosinophils Relative: 2 %
HCT: 29.5 % — ABNORMAL LOW (ref 36.0–46.0)
Hemoglobin: 9.8 g/dL — ABNORMAL LOW (ref 12.0–15.0)
Immature Granulocytes: 0 %
Lymphocytes Relative: 26 %
Lymphs Abs: 1.2 10*3/uL (ref 0.7–4.0)
MCH: 33.1 pg (ref 26.0–34.0)
MCHC: 33.2 g/dL (ref 30.0–36.0)
MCV: 99.7 fL (ref 80.0–100.0)
Monocytes Absolute: 0.4 10*3/uL (ref 0.1–1.0)
Monocytes Relative: 8 %
Neutro Abs: 3 10*3/uL (ref 1.7–7.7)
Neutrophils Relative %: 64 %
Platelets: 201 10*3/uL (ref 150–400)
RBC: 2.96 MIL/uL — ABNORMAL LOW (ref 3.87–5.11)
RDW: 12.1 % (ref 11.5–15.5)
WBC: 4.6 10*3/uL (ref 4.0–10.5)
nRBC: 0 % (ref 0.0–0.2)

## 2021-11-23 MED ORDER — HEPARIN SOD (PORK) LOCK FLUSH 100 UNIT/ML IV SOLN
INTRAVENOUS | Status: AC
  Filled 2021-11-23: qty 5

## 2021-11-23 MED ORDER — SODIUM CHLORIDE 0.9% FLUSH
10.0000 mL | Freq: Once | INTRAVENOUS | Status: AC
  Administered 2021-11-23: 10 mL via INTRAVENOUS
  Filled 2021-11-23: qty 10

## 2021-11-23 MED ORDER — HEPARIN SOD (PORK) LOCK FLUSH 100 UNIT/ML IV SOLN
500.0000 [IU] | Freq: Once | INTRAVENOUS | Status: AC
  Administered 2021-11-23: 500 [IU] via INTRAVENOUS
  Filled 2021-11-23: qty 5

## 2021-11-23 MED ORDER — SODIUM CHLORIDE 0.9 % IV SOLN
Freq: Once | INTRAVENOUS | Status: AC
  Filled 2021-11-23: qty 250

## 2021-11-23 MED ORDER — POTASSIUM CHLORIDE 20 MEQ/100ML IV SOLN
20.0000 meq | Freq: Once | INTRAVENOUS | Status: AC
  Administered 2021-11-23: 20 meq via INTRAVENOUS

## 2021-11-23 NOTE — Progress Notes (Signed)
Pt needs 20 KCL today only; No additional fluids. Discharged to home at completion.

## 2021-11-23 NOTE — Progress Notes (Signed)
Hematology/Oncology Consult note [facility]  Telephone:([phone] [phone]  Patient Care Team: Cletis Athens, MD as PCP - General (Internal Medicine) Grant Fontana, [place] as Referring Physician (Chiropractic Medicine) Christene Lye, MD (General Surgery) Sindy Guadeloupe, MD as Consulting Physician (Hematology and Oncology)   Name of the patient: Sheryl Perez  [id number]  Oct 18, 1960   Date of visit: 11/23/21  Diagnosis- pancreatic adenocarcinoma stage II apT2 N1 M0  Chief complaint/ Reason for visit-routine follow-up of pancreatic cancer  Heme/Onc history:  patient is a 61 year old female with a history of irritable bowel disease for which she follows up with [facility].  She has undergone EGD and colonoscopy with them in the past.  More recently patient was complaining of left upper quadrant and epigastric abdominal pain which causes sudden episodes of abdominal spasms and was prescribed as needed tramadol for it.  These episodes of pain are unrelated to food and are dull aching or throbbing.  She has undergone cholecystectomy in the past.  She underwent CT abdomen and pelvis with contrast in the ER on 08/04/2020 Which did not reveal any acute pathology which showed a possible hypoechoic 1.9 cm right hepatic lobe lesion.  This was followed by an MRI which showed no discrete lesion in the liver and the area of concern noted on CT scan was more likely to represent variable fat deposition.  However there was loss of normal T1 signal in the peripheral pancreas and a potential small pancreatic lesion suspicious for early pancreatic adenocarcinoma in the neck of the pancreas.     Patient underwent EUS by Dr. Mont Dutton which showed an irregular mass in the pancreatic neck measuring 15 x 11 mm.  FNA showed adenocarcinoma.  Pancreatic duct measured 1.1 mm in the head with abrupt dilatation in the region of the mass to 4.1 mm.  No abnormality noted in the common  bile duct and hepatic duct.  Celiac region showed no significant endosonographic abnormality.  No lymphadenopathy was seen.  CA 19-9 mildly elevated at 35   Patient was seen by Dr. Hyman Hopes from pancreaticobiliary surgery at [facility] for surgical opinion.  Although patient has a small lesion involving the pancreatic neck, there was a concern for tumor extension towards the splenic vein and narrowing of portal splenic confluence.  Patient received 3 months of neoadjuvant modified FOLFIRINOX chemotherapyAnd then underwent definitive Whipple surgery at [facility] with Dr. Hyman Hopes on 01/13/2021.  Final pathology showed 2.3 cm grade 2 moderately differentiated adenocarcinoma with lymphovascular and perineural invasion.  Treatment effect present with residual cancer showing evident tumor regression but more than single cells or rare small group of cancer cells] partial response.  Margins negative.  1 out of 22 lymph nodes positive for malignancy.   Patient completed adjuvant chemotherapy in March 2023.  She remains in remission  Interval history-she is working full-time.  She does report some ongoing fatigue.  She has not been compliant with her potassium on a daily basis.  Denies any diarrhea.  Denies any abdominal pain  ECOG PS- 1 Pain scale- 0   Review of systems- Review of Systems  Constitutional:  Positive for malaise/fatigue. Negative for chills, fever and weight loss.  HENT:  Negative for congestion, ear discharge and nosebleeds.   Eyes:  Negative for blurred vision.  Respiratory:  Negative for cough, hemoptysis, sputum production, shortness of breath and wheezing.   Cardiovascular:  Negative for chest pain, palpitations, orthopnea and claudication.  Gastrointestinal:  Negative for abdominal  pain, blood in stool, constipation, diarrhea, heartburn, melena, nausea and vomiting.  Genitourinary:  Negative for dysuria, flank pain, frequency, hematuria and urgency.  Musculoskeletal:  Negative for back pain,  joint pain and myalgias.  Skin:  Negative for rash.  Neurological:  Negative for dizziness, tingling, focal weakness, seizures, weakness and headaches.  Endo/Heme/Allergies:  Does not bruise/bleed easily.  Psychiatric/Behavioral:  Negative for depression and suicidal ideas. The patient does not have insomnia.       Allergies  Allergen Reactions   Sulfacetamide Sodium Swelling    Diarrhea,dizziness, tongue swelling   Other Other (See Comments) and Swelling    PT states that anything with the sides effects "may cause rash" she cannot take due to her psoriasis, it will make the patches on her hand so thick she can't use her hands.  Diarrhea,dizziness, tongue swelling   Oxycontin [Oxycodone Hcl] Nausea And Vomiting   Sulfa Antibiotics Swelling    Diarrhea,dizziness, tongue swelling   Abilify [Aripiprazole] Other (See Comments)   Cephalosporins Rash   Erythromycin Other (See Comments)    Abd. pain Abd. pain Other reaction(s): Other (See Comments) Abd. 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    Bipolar disorder (HCC)    Chest pain, unspecified    Chronic low back pain 10/23/2013   Chronic UTI    COPD (chronic obstructive pulmonary disease) (HCC)    Depression    Dysfunction of eustachian tube    Enlargement of lymph nodes    Family history of brain cancer    Family history of breast cancer    Family history of prostate cancer    History of Bell's palsy Right   Injury, other and unspecified, knee, leg, ankle, and foot    MRSA (methicillin resistant staph aureus) culture positive 07/11/2014   abscess   Other specified disease of hair and hair follicles    Pancreatic cancer ([place])    Pancreatic cancer ([place])    Rectal prolapse    Unspecified symptom  associated with female genital organs      Past Surgical History:  Procedure Laterality Date   ABDOMINAL HYSTERECTOMY     bladder tack  2010   CHOLECYSTECTOMY  2002   FOOT SURGERY Bilateral 2004   bunion   GANGLION CYST EXCISION  2002   INCISION AND DRAINAGE ABSCESS  04-16-15   PARTIAL HYSTERECTOMY  2010   uterus removed   PORTA CATH INSERTION N/A 09/11/2020   Procedure: PORTA CATH INSERTION;  Surgeon: Algernon Huxley, MD;  Location: [facility];  Service: Cardiovascular;  Laterality: N/A;    Social History   Socioeconomic History   Marital status: Married    Spouse name: Not on file   Number of children: 3   Years of education: hs   Highest education level: Some college, no degree  Occupational History   Not on file  Tobacco Use   Smoking status: Former    Packs/day: 0.50    Years: 40.00    Total pack years: 20.00    Types: Cigarettes    Quit date: 07/14/2020    Years since quitting: 1.3   Smokeless tobacco: Never  Vaping Use   Vaping Use: Never used  Substance and Sexual Activity  Alcohol use: No    Alcohol/week: 1.0 standard drink of alcohol    Types: 1 Glasses of wine per week   Drug use: Yes    Types: Marijuana    Comment: last used 05-20-15   Sexual activity: Yes    Birth control/protection: None  Other Topics Concern   Not on file  Social History Narrative   Patient lives at home with her husband [name]).   Patient works full time.   Education CNA    Right handed.   Caffeine Tea , Soda.   Social Determinants of Health   Financial Resource Strain: High Risk (01/10/2017)   Overall Financial Resource Strain (CARDIA)    Difficulty of Paying Living Expenses: Hard  Food Insecurity: No Food Insecurity (01/10/2017)   Hunger Vital Sign    Worried About Running Out of Food in the Last Year: Never true    Ran Out of Food in the Last Year: Never true  Transportation Needs: No Transportation Needs (01/10/2017)   PRAPARE - Radiographer, therapeutic (Medical): No    Lack of Transportation (Non-Medical): No  Physical Activity: Inactive (01/10/2017)   Exercise Vital Sign    Days of Exercise per Week: 0 days    Minutes of Exercise per Session: 0 min  Stress: Stress Concern Present (01/10/2017)   [place]    Feeling of Stress : Very much  Social Connections: Somewhat Isolated (01/10/2017)   Social Connection and Isolation Panel [NHANES]    Frequency of Communication with Friends and Family: More than three times a week    Frequency of Social Gatherings with Friends and Family: More than three times a week    Attends Religious Services: More than 4 times per year    Active Member of [employer] or Organizations: No    Attends Archivist Meetings: Never    Marital Status: Separated  Intimate Partner Violence: Not At Risk (01/10/2017)   Humiliation, Afraid, Rape, and Kick questionnaire    Fear of Current or Ex-Partner: No    Emotionally Abused: No    Physically Abused: No    Sexually Abused: No    Family History  Problem Relation Age of Onset   Diabetes Father    Cancer Father        brain tumor   Depression Father    Depression Sister    Alcohol abuse Brother    Depression Brother    Leukemia Maternal Aunt    Prostate cancer Maternal Uncle    Breast cancer Paternal Aunt        dx >50   Heart disease Maternal Grandmother    Heart attack Maternal Grandmother    Stroke Maternal Grandmother    Parkinson's disease Maternal Grandfather    Leukemia Paternal Grandmother    Diabetes Paternal Grandfather    Breast cancer Cousin    Brain cancer Niece        dx under 50     Current Outpatient Medications:    albuterol (VENTOLIN HFA) 108 (90 Base) MCG/ACT inhaler, TAKE 2 PUFFS BY MOUTH EVERY 6 HOURS AS NEEDED FOR WHEEZE OR SHORTNESS OF BREATH, Disp: 8 each, Rfl: 4   Ascorbic Acid (VITAMIN C) 1000 MG tablet, Take 1,000 mg by mouth daily., Disp: ,  Rfl:    calcium-vitamin D (OSCAL WITH D) 500-5 MG-MCG tablet, Take 1 tablet by mouth daily., Disp: , Rfl:    divalproex (DEPAKOTE) 250 MG DR  tablet, Take 1 tablet (250 mg total) by mouth 2 (two) times daily., Disp: 180 tablet, Rfl: 1   famotidine (PEPCID) 20 MG tablet, Take 1 tablet by mouth 2 (two) times daily., Disp: , Rfl:    fluticasone (FLONASE) 50 MCG/ACT nasal spray, Place 2 sprays into both nostrils as needed for allergies or rhinitis., Disp: , Rfl:    loratadine (CLARITIN) 10 MG tablet, Take 10 mg by mouth daily., Disp: , Rfl:    Multiple Vitamin (MULTIVITAMIN) tablet, Take 1 tablet by mouth daily., Disp: , Rfl:    potassium chloride SA (KLOR-CON M20) 20 MEQ tablet, Take 1 tablet (20 mEq total) by mouth 2 (two) times daily., Disp: 90 tablet, Rfl: 1   propranolol (INDERAL) 20 MG tablet, Take 1 tablet (20 mg total) by mouth 3 (three) times daily., Disp: 270 tablet, Rfl: 1   loperamide (IMODIUM A-D) 2 MG tablet, Take 2 at onset of diarrhea, then 1 every 2hrs until 12hr without a BM. May take 2 tab every 4hrs at bedtime. If diarrhea recurs repeat. (Patient not taking: Reported on 11/23/2021), Disp: 100 tablet, Rfl: 1 No current facility-administered medications for this visit.  Facility-Administered Medications Ordered in Other Visits:    heparin lock flush 100 UNIT/ML injection, , , ,    heparin lock flush 100 UNIT/ML injection, , , ,    heparin lock flush 100 unit/mL, 500 Units, Intravenous, Once, Sindy Guadeloupe, MD   heparin lock flush 100 unit/mL, 500 Units, Intravenous, Once, Sindy Guadeloupe, MD   sodium chloride flush (NS) 0.9 % injection 10 mL, 10 mL, Intracatheter, PRN, Sindy Guadeloupe, MD, 10 mL at 11/03/20 1441   sodium chloride flush (NS) 0.9 % injection 10 mL, 10 mL, Intravenous, PRN, Sindy Guadeloupe, MD  Physical exam:  Vitals:   11/23/21 1001  BP: (!) 117/46  Pulse: 80  Resp: 16  Temp: 97.9 F (36.6 C)  SpO2: 100%  Weight: 108 lb (49 kg)   Physical Exam Cardiovascular:      Rate and Rhythm: Normal rate and regular rhythm.     Heart sounds: Normal heart sounds.  Pulmonary:     Effort: Pulmonary effort is normal.     Breath sounds: Normal breath sounds.  Abdominal:     General: Bowel sounds are normal.     Palpations: Abdomen is soft.     Comments: Midline surgical scar of prior Whipple's is seen  Skin:    General: Skin is warm and dry.  Neurological:     Mental Status: She is alert and oriented to person, place, and time.         Latest Ref Rng & Units 11/23/2021    9:39 AM  CMP  Glucose 70 - 99 mg/dL 125   BUN 8 - 23 mg/dL 9   Creatinine 0.44 - 1.00 mg/dL 0.85   Sodium 135 - 145 mmol/L 137   Potassium 3.5 - 5.1 mmol/L 2.9   Chloride 98 - 111 mmol/L 110   CO2 22 - 32 mmol/L 23   Calcium 8.9 - 10.3 mg/dL 8.5   Total Protein 6.5 - 8.1 g/dL 7.0   Total Bilirubin 0.3 - 1.2 mg/dL 0.4   Alkaline Phos 38 - 126 U/L 147   AST 15 - 41 U/L 28   ALT 0 - 44 U/L 17       Latest Ref Rng & Units 11/23/2021    9:39 AM  CBC  WBC 4.0 - 10.5 K/uL 4.6  Hemoglobin 12.0 - 15.0 g/dL 9.8   Hematocrit 36.0 - 46.0 % 29.5   Platelets 150 - 400 K/uL 201     No images are attached to the encounter.  [place] ELASTOGRAPHY LIVER  Result Date: 11/16/2021 CLINICAL DATA:  Hepatic steatosis on CT EXAM: [place] ELASTOGRAPHY HEPATIC TECHNIQUE: Sonography of the liver was performed. In addition, ultrasound elastography evaluation of the liver was performed. A region of interest was placed within the right lobe of the liver. Following application of a compressive sonographic pulse, tissue compressibility was assessed. Multiple assessments were performed at the selected site. Median tissue compressibility was determined. Previously, hepatic stiffness was assessed by shear wave velocity. Based on recently published Society of Radiologists in Ultrasound consensus article, reporting is now recommended to be performed in the SI units of pressure (kiloPascals) representing hepatic  stiffness/elasticity. The obtained result is compared to the published reference standards. (cACLD = compensated Advanced Chronic Liver Disease) COMPARISON:  CT 11/03/2021 FINDINGS: Liver: Increased echotexture compatible with fatty infiltration. No focal abnormality or biliary ductal dilatation. Portal vein is patent on color Doppler imaging with normal direction of blood flow towards the liver. ULTRASOUND HEPATIC ELASTOGRAPHY Device: Siemens Helix VTQ Patient position: Supine Transducer: 5C1 Number of measurements: 10 Hepatic segment:  7 Median kPa: 5.3 IQR: 1.5 IQR/Median kPa ratio: 0.28 Data quality:  Good Diagnostic category: < or = 9 kPa: in the absence of other known clinical signs, rules out cACLD The use of hepatic elastography is applicable to patients with viral hepatitis and non-alcoholic fatty liver disease. At this time, there is insufficient data for the referenced cut-off values and use in other causes of liver disease, including alcoholic liver disease. Patients, however, may be assessed by elastography and serve as their own reference standard/baseline. In patients with non-alcoholic liver disease, the values suggesting compensated advanced chronic liver disease (cACLD) may be lower, and patients may need additional testing with elasticity results of 7-9 kPa. Please note that abnormal hepatic elasticity and shear wave velocities may also be identified in clinical settings other than with hepatic fibrosis, such as: acute hepatitis, elevated right heart and central venous pressures including use of beta blockers, veno-occlusive disease (Budd-Chiari), infiltrative processes such as mastocytosis/amyloidosis/infiltrative tumor/lymphoma, extrahepatic cholestasis, with hyperemia in the post-prandial state, and with liver transplantation. Correlation with patient history, laboratory data, and clinical condition recommended. Diagnostic Categories: < or =5 kPa: high probability of being normal < or =9 kPa:  in the absence of other known clinical signs, rules out cACLD >9 kPa and ?13 kPa: suggestive of cACLD, but needs further testing >13 kPa: highly suggestive of cACLD > or =17 kPa: highly suggestive of cACLD with an increased probability of clinically significant portal hypertension IMPRESSION: ULTRASOUND LIVER: Hepatic steatosis ULTRASOUND HEPATIC ELASTOGRAPHY: Median kPa:  5.3 Diagnostic category: < or = 9 kPa: in the absence of other known clinical signs, rules out cACLD Electronically Signed   By: Rolm Baptise M.D.   On: 11/16/2021 13:47   CT Abdomen Pelvis W Contrast  Result Date: 11/04/2021 CLINICAL DATA:  Pancreatic cancer, new abdominal fullness, tenderness and abdominal pain. EXAM: CT ABDOMEN AND PELVIS WITH CONTRAST TECHNIQUE: Multidetector CT imaging of the abdomen and pelvis was performed using the standard protocol following bolus administration of intravenous contrast. RADIATION DOSE REDUCTION: This exam was performed according to the departmental dose-optimization program which includes automated exposure control, adjustment of the mA and/or kV according to patient size and/or use of iterative reconstruction technique. CONTRAST:  169m OMNIPAQUE IOHEXOL 300 MG/ML  SOLN COMPARISON:  None Available. FINDINGS: Lower chest: No acute abnormality. Hepatobiliary: Interval development of severe hepatic steatosis. No enhancing intrahepatic mass. No intra or extrahepatic biliary ductal dilation. Cholecystectomy has been performed. Pancreas: Surgical changes of Whipple resection are identified. Normal enhancement of the residual pancreatic tail. Previously noted pancreatic ductal dilation has resolved. No peripancreatic inflammatory changes are identified. Spleen: Unremarkable Adrenals/Urinary Tract: The adrenal glands are unremarkable. Simple cortical cyst noted within the upper pole of the left kidney. No follow-up imaging is recommended for this lesion. The kidneys are otherwise unremarkable. The bladder is  distended, but is otherwise unremarkable. Stomach/Bowel: Moderate colonic stool burden without evidence of obstruction. Gastrojejunostomy appears widely patent. The residual stomach, small bowel, and large bowel are otherwise unremarkable. No evidence of obstruction or focal inflammation. Appendix normal. No free intraperitoneal gas or fluid. Vascular/Lymphatic: Moderate atherosclerotic calcification. No aortic aneurysm. No pathologic adenopathy within the abdomen and pelvis. Reproductive: Status post hysterectomy. No adnexal masses. Other: No abdominal wall hernia. Musculoskeletal: No acute bone abnormality. Unilateral right L5 pars defect is present with associated grade 1 anterolisthesis L5-S1. No lytic or blastic bone lesion. IMPRESSION: 1. No acute intra-abdominal pathology identified. No definite radiographic explanation for the patient's reported symptoms. 2. Interval development of severe hepatic steatosis. 3. Surgical changes of Whipple resection. No evidence of residual or recurrent disease within the abdomen and pelvis. 4. Moderate colonic stool burden without evidence of obstruction. Aortic Atherosclerosis (ICD10-I70.0). Electronically Signed   By: Fidela Salisbury M.D.   On: 11/04/2021 01:57   VAS [place] LOWER EXTREMITY VENOUS (DVT)  Result Date: 10/30/2021  Lower Venous DVT Study Patient Name:  JOEE IOVINE  Date of Exam:   10/28/2021 Medical Rec #: [id number]         Accession #:    [id number] Date of Birth: [birth year]         Patient Gender: F Patient Age:   62 years Exam Location:  [facility] Procedure:      VAS [place] LOWER EXTREMITY VENOUS (DVT) Referring Phys: Eulogio Ditch --------------------------------------------------------------------------------  Indications: Swelling, and right. Other Indications: Has been on chemo. Performing Technologist: Concha Norway RVT  Examination Guidelines: A complete evaluation includes B-mode imaging, spectral Doppler, color Doppler, and power Doppler  as needed of all accessible portions of each vessel. Bilateral testing is considered an integral part of a complete examination. Limited examinations for reoccurring indications may be performed as noted. The reflux portion of the exam is performed with the patient in reverse Trendelenburg.  +---------+---------------+---------+-----------+----------+--------------+ RIGHT    CompressibilityPhasicitySpontaneityPropertiesThrombus Aging +---------+---------------+---------+-----------+----------+--------------+ CFV      Full           Yes      Yes                                 +---------+---------------+---------+-----------+----------+--------------+ SFJ      Full           Yes      Yes                                 +---------+---------------+---------+-----------+----------+--------------+ FV Prox  Full                                                        +---------+---------------+---------+-----------+----------+--------------+  FV Mid   Full           Yes      Yes                                 +---------+---------------+---------+-----------+----------+--------------+ FV DistalFull                                                        +---------+---------------+---------+-----------+----------+--------------+ PFV      Full           Yes      Yes                                 +---------+---------------+---------+-----------+----------+--------------+ POP      Full           Yes      Yes                                 +---------+---------------+---------+-----------+----------+--------------+ PTV      Full           Yes      Yes                                 +---------+---------------+---------+-----------+----------+--------------+ PERO     Full           Yes      Yes                                 +---------+---------------+---------+-----------+----------+--------------+ Gastroc  Full           Yes      Yes                                  +---------+---------------+---------+-----------+----------+--------------+ GSV      Full           Yes      Yes                                 +---------+---------------+---------+-----------+----------+--------------+ SSV      Full           Yes      Yes                                 +---------+---------------+---------+-----------+----------+--------------+   +----+---------------+---------+-----------+----------+--------------+ LEFTCompressibilityPhasicitySpontaneityPropertiesThrombus Aging +----+---------------+---------+-----------+----------+--------------+ CFV Full           Yes      Yes                                 +----+---------------+---------+-----------+----------+--------------+ SFJ Full           Yes      Yes                                 +----+---------------+---------+-----------+----------+--------------+  Summary: RIGHT: - No evidence of deep vein thrombosis in the lower extremity. No indirect evidence of obstruction proximal to the inguinal ligament.  LEFT: - No evidence of common femoral vein obstruction.  *See table(s) above for measurements and observations. Electronically signed by Leotis Pain MD on 10/30/2021 at 11:01:15 AM.    Final      Assessment and plan- Patient is a 61 y.o. female with stage II pancreatic adenocarcinoma T2 N0 M0 s/p neoadjuvant and adjuvant modified FOLFIRINOX chemotherapy completed in April 2023 .  He is here for routine follow-up  Clinically patient is doing well.  Abdominal exam is unremarkable.  She reports more energy although her weight has still beenAround 108 pounds.  She is scheduled for CT scans next month at [facility].  CA 19-9 today is pending  Hypokalemia: We will give her 20 mEq of IV potassium today and monitor it on a monthly basis.  Have also encouraged her to continue her oral potassium.  Normocytic anemia: Likely secondary to chronic disease.  I will check ferritin and iron studies B12  and folate again in 1 month.  Prior numbers have been unremarkable   Visit Diagnosis 1. Pancreatic adenocarcinoma ([place])   2. Hypokalemia      Dr. Randa Evens, MD, MPH [facility] at [facility] [phone] 11/23/2021 2:09 PM

## 2021-11-24 ENCOUNTER — Telehealth: Payer: Self-pay | Admitting: *Deleted

## 2021-11-24 LAB — CANCER ANTIGEN 19-9: CA 19-9: 24 U/mL (ref 0–35)

## 2021-11-24 NOTE — Telephone Encounter (Signed)
FMLA form received by fax for this patient

## 2021-11-24 NOTE — Telephone Encounter (Signed)
I called patient to get start date and was told that she actually needs 2 forms one for continuous 10/27/21-11/05/21 and one for intermittent from 11/06/21- TBD

## 2021-11-25 ENCOUNTER — Encounter: Payer: Self-pay | Admitting: Oncology

## 2021-11-25 DIAGNOSIS — Z7689 Persons encountering health services in other specified circumstances: Secondary | ICD-10-CM | POA: Diagnosis not present

## 2021-11-25 DIAGNOSIS — C259 Malignant neoplasm of pancreas, unspecified: Secondary | ICD-10-CM | POA: Diagnosis not present

## 2021-11-25 DIAGNOSIS — J309 Allergic rhinitis, unspecified: Secondary | ICD-10-CM | POA: Diagnosis not present

## 2021-11-25 DIAGNOSIS — I2089 Other forms of angina pectoris: Secondary | ICD-10-CM | POA: Diagnosis not present

## 2021-11-25 DIAGNOSIS — I509 Heart failure, unspecified: Secondary | ICD-10-CM | POA: Diagnosis not present

## 2021-11-25 NOTE — Telephone Encounter (Signed)
Form complete and ready for doctor signature

## 2021-12-07 ENCOUNTER — Encounter (INDEPENDENT_AMBULATORY_CARE_PROVIDER_SITE_OTHER): Payer: Self-pay

## 2021-12-10 DIAGNOSIS — I509 Heart failure, unspecified: Secondary | ICD-10-CM | POA: Diagnosis not present

## 2021-12-10 DIAGNOSIS — I2089 Other forms of angina pectoris: Secondary | ICD-10-CM | POA: Diagnosis not present

## 2021-12-22 DIAGNOSIS — I509 Heart failure, unspecified: Secondary | ICD-10-CM | POA: Diagnosis not present

## 2021-12-22 DIAGNOSIS — C259 Malignant neoplasm of pancreas, unspecified: Secondary | ICD-10-CM | POA: Diagnosis not present

## 2021-12-22 DIAGNOSIS — J309 Allergic rhinitis, unspecified: Secondary | ICD-10-CM | POA: Diagnosis not present

## 2021-12-22 DIAGNOSIS — I2089 Other forms of angina pectoris: Secondary | ICD-10-CM | POA: Diagnosis not present

## 2021-12-23 NOTE — Progress Notes (Signed)
Established Patient Office Visit  Subjective:  Patient ID: Sheryl Perez, female    DOB: 1960/05/06  Age: 61 y.o. MRN: [id number]  CC:  Chief Complaint  Patient presents with   Annual Exam    Thinks she has a sinus infection-yellow phlegm. Needs pap and is fasting.      HPI  Sheryl Perez present to the clinic for her annual physical exam.  Flu: no Tetanus:2021 COVID:no  Pap smear: total hysterectomy Dentist: as needed Eye examination: due Exercise: some   Diet: Patient does  eat meat. Patient consumes fruits and veggies. Patient eat some fried food. Patient drinks water tea and juice.  She complaints of sinus pressure,  congestion  and headache going on from a week. Denies fever, nausea, vomiting diarrhea.   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    Bipolar disorder (HCC)    Chest pain, unspecified    Chronic low back pain 10/23/2013   Chronic UTI    COPD (chronic obstructive pulmonary disease) (HCC)    Depression    Dysfunction of eustachian tube    Enlargement of lymph nodes    Family history of brain cancer    Family history of breast cancer    Family history of prostate cancer    History of Bell's palsy Right   Injury, other and unspecified, knee, leg, ankle, and foot    MRSA (methicillin resistant staph aureus) culture positive 07/11/2014   abscess   Other specified disease of hair and hair follicles    Pancreatic cancer ([place])    Pancreatic cancer ([place])    Rectal prolapse    Unspecified symptom associated with female genital organs     Past Surgical History:  Procedure Laterality Date   ABDOMINAL HYSTERECTOMY     bladder tack  2010   CHOLECYSTECTOMY  2002   FOOT SURGERY Bilateral 2004   bunion   GANGLION CYST EXCISION  2002   INCISION AND DRAINAGE ABSCESS  04-16-15   PARTIAL HYSTERECTOMY  2010   uterus removed   PORTA CATH INSERTION N/A 09/11/2020   Procedure: PORTA  CATH INSERTION;  Surgeon: Algernon Huxley, MD;  Location: [facility];  Service: Cardiovascular;  Laterality: N/A;    Family History  Problem Relation Age of Onset   Diabetes Father    Cancer Father        brain tumor   Depression Father    Depression Sister    Alcohol abuse Brother    Depression Brother    Leukemia Maternal Aunt    Prostate cancer Maternal Uncle    Breast cancer Paternal Aunt        dx >50   Heart disease Maternal Grandmother    Heart attack Maternal Grandmother    Stroke Maternal Grandmother    Parkinson's disease Maternal Grandfather    Leukemia Paternal Grandmother    Diabetes Paternal Grandfather    Breast cancer Cousin    Brain cancer Niece        dx under 73    Social History   Socioeconomic History   Marital status: Married    Spouse name: Not on file   Number of children: 3   Years of education: hs   Highest education level: Some college, no degree  Occupational History   Not on file  Tobacco Use   Smoking status: Former  Packs/day: 0.50    Years: 40.00    Total pack years: 20.00    Types: Cigarettes    Quit date: 07/14/2020    Years since quitting: 1.4   Smokeless tobacco: Never  Vaping Use   Vaping Use: Never used  Substance and Sexual Activity   Alcohol use: No    Alcohol/week: 1.0 standard drink of alcohol    Types: 1 Glasses of wine per week   Drug use: Yes    Frequency: 7.0 times per week    Types: Marijuana   Sexual activity: Yes    Birth control/protection: None  Other Topics Concern   Not on file  Social History Narrative   Patient lives at home with her husband Sheryl Perez).   Patient works full time.   Education CNA    Right handed.   Caffeine Tea , Soda.   Social Determinants of Health   Financial Resource Strain: High Risk (01/10/2017)   Overall Financial Resource Strain (CARDIA)    Difficulty of Paying Living Expenses: Hard  Food Insecurity: No Food Insecurity (01/10/2017)   Hunger Vital Sign    Worried  About Running Out of Food in the Last Year: Never true    Ran Out of Food in the Last Year: Never true  Transportation Needs: No Transportation Needs (01/10/2017)   PRAPARE - Hydrologist (Medical): No    Lack of Transportation (Non-Medical): No  Physical Activity: Inactive (01/10/2017)   Exercise Vital Sign    Days of Exercise per Week: 0 days    Minutes of Exercise per Session: 0 min  Stress: Stress Concern Present (01/10/2017)   [place]    Feeling of Stress : Very much  Social Connections: Somewhat Isolated (01/10/2017)   Social Connection and Isolation Panel [NHANES]    Frequency of Communication with Friends and Family: More than three times a week    Frequency of Social Gatherings with Friends and Family: More than three times a week    Attends Religious Services: More than 4 times per year    Active Member of [employer] or Organizations: No    Attends Archivist Meetings: Never    Marital Status: Separated  Intimate Partner Violence: Not At Risk (01/10/2017)   Humiliation, Afraid, Rape, and Kick questionnaire    Fear of Current or Ex-Partner: No    Emotionally Abused: No    Physically Abused: No    Sexually Abused: No     Outpatient Medications Prior to Visit  Medication Sig Dispense Refill   albuterol (VENTOLIN HFA) 108 (90 Base) MCG/ACT inhaler TAKE 2 PUFFS BY MOUTH EVERY 6 HOURS AS NEEDED FOR WHEEZE OR SHORTNESS OF BREATH 8 each 4   Ascorbic Acid (VITAMIN C) 1000 MG tablet Take 1,000 mg by mouth daily.     calcium-vitamin D (OSCAL WITH D) 500-5 MG-MCG tablet Take 1 tablet by mouth daily.     divalproex (DEPAKOTE) 250 MG DR tablet Take 1 tablet (250 mg total) by mouth 2 (two) times daily. 180 tablet 1   famotidine (PEPCID) 20 MG tablet Take 1 tablet by mouth 2 (two) times daily.     fluticasone (FLONASE) 50 MCG/ACT nasal spray Place 2 sprays into both nostrils as needed for  allergies or rhinitis.     loperamide (IMODIUM A-D) 2 MG tablet Take 2 at onset of diarrhea, then 1 every 2hrs until 12hr without a BM. May take 2 tab every  4hrs at bedtime. If diarrhea recurs repeat. 100 tablet 1   loratadine (CLARITIN) 10 MG tablet Take 10 mg by mouth daily.     Multiple Vitamin (MULTIVITAMIN) tablet Take 1 tablet by mouth daily.     propranolol (INDERAL) 20 MG tablet Take 1 tablet (20 mg total) by mouth 3 (three) times daily. 270 tablet 1   potassium chloride SA (KLOR-CON M20) 20 MEQ tablet Take 1 tablet (20 mEq total) by mouth 2 (two) times daily. 90 tablet 1   Facility-Administered Medications Prior to Visit  Medication Dose Route Frequency Provider Last Rate Last Admin   heparin lock flush 100 UNIT/ML injection            heparin lock flush 100 UNIT/ML injection            heparin lock flush 100 unit/mL  500 Units Intravenous Once Sindy Guadeloupe, MD       heparin lock flush 100 unit/mL  500 Units Intravenous Once Sindy Guadeloupe, MD       sodium chloride flush (NS) 0.9 % injection 10 mL  10 mL Intracatheter PRN Sindy Guadeloupe, MD   10 mL at 11/03/20 1441   sodium chloride flush (NS) 0.9 % injection 10 mL  10 mL Intravenous PRN Sindy Guadeloupe, MD        Allergies  Allergen Reactions   Sulfacetamide Sodium Swelling    Diarrhea,dizziness, tongue swelling   Other Other (See Comments) and Swelling    PT states that anything with the sides effects "may cause rash" she cannot take due to her psoriasis, it will make the patches on her hand so thick she can't use her hands.  Diarrhea,dizziness, tongue swelling   Oxycontin [Oxycodone Hcl] Nausea And Vomiting   Sulfa Antibiotics Swelling    Diarrhea,dizziness, tongue swelling   Abilify [Aripiprazole] Other (See Comments)   Cephalosporins Rash   Erythromycin Other (See Comments)    Abd. pain Abd. pain Other reaction(s): Other (See Comments) Abd. pain   Lamotrigine Rash and Other (See Comments)    psorasis worsen psorasis  worsen   Risperidone Other (See Comments)    tremors tremors tremors    ROS Review of Systems  Constitutional: Negative.   HENT:  Positive for congestion and sinus pressure.   Eyes: Negative.   Respiratory:  Positive for cough. Negative for shortness of breath.   Cardiovascular:  Negative for chest pain and palpitations.  Gastrointestinal: Negative.   Endocrine: Negative.   Genitourinary: Negative.   Musculoskeletal: Negative.   Neurological:  Positive for headaches. Negative for dizziness.  Psychiatric/Behavioral:  Negative for agitation, behavioral problems and confusion. The patient is not nervous/anxious.       Objective:    Physical Exam Constitutional:      Appearance: Normal appearance. She is obese.  HENT:     Head: Normocephalic.     Right Ear: Tympanic membrane normal.     Left Ear: Tympanic membrane normal.     Nose:     Right Sinus: Maxillary sinus tenderness and frontal sinus tenderness present.     Left Sinus: Maxillary sinus tenderness and frontal sinus tenderness present.     Mouth/Throat:     Mouth: Mucous membranes are moist.     Dentition: Has dentures.     Pharynx: Oropharynx is clear.  Eyes:     Extraocular Movements: Extraocular movements intact.     Conjunctiva/sclera: Conjunctivae normal.     Pupils: Pupils are equal, round, and reactive to  light.  Cardiovascular:     Rate and Rhythm: Normal rate and regular rhythm.     Pulses: Normal pulses.     Heart sounds: Normal heart sounds.  Pulmonary:     Effort: Pulmonary effort is normal. No respiratory distress.     Breath sounds: Normal breath sounds. No rhonchi.  Abdominal:     General: Bowel sounds are normal.     Palpations: Abdomen is soft. There is no mass.     Tenderness: There is no abdominal tenderness.     Hernia: No hernia is present.  Musculoskeletal:        General: Normal range of motion.     Cervical back: Neck supple. No tenderness.  Skin:    General: Skin is warm.      Capillary Refill: Capillary refill takes less than 2 seconds.  Neurological:     General: No focal deficit present.     Mental Status: She is alert and oriented to person, place, and time. Mental status is at baseline.  Psychiatric:        Mood and Affect: Mood normal.        Behavior: Behavior normal.        Thought Content: Thought content normal.        Judgment: Judgment normal.     BP (!) 118/58   Pulse 68   Temp 97.7 F (36.5 C) (Temporal)   Ht 4' 11.5" (1.511 m)   Wt 105 lb 11.2 oz (47.9 kg)   LMP 02/09/2008   SpO2 99%   BMI 20.99 kg/m  Wt Readings from Last 3 Encounters:  12/24/21 105 lb 11.2 oz (47.9 kg)  11/23/21 108 lb (49 kg)  11/02/21 112 lb (50.8 kg)     Health Maintenance  Topic Date Due   HIV Screening  Never done   Hepatitis C Screening  Never done   Lung Cancer Screening  03/07/2021   COLONOSCOPY (Pts 45-60yr Insurance coverage will need to be confirmed)  02/22/2022   MAMMOGRAM  04/04/2022   HPV VACCINES  Aged Out   INFLUENZA VACCINE  Discontinued   PAP SMEAR-Modifier  Discontinued   COVID-19 Vaccine  Discontinued   Zoster Vaccines- Shingrix  Discontinued    There are no preventive care reminders to display for this patient.  Lab Results  Component Value Date   TSH 1.241 06/16/2021   Lab Results  Component Value Date   WBC 4.0 12/25/2021   HGB 10.5 (L) 12/25/2021   HCT 32.6 (L) 12/25/2021   MCV 99.1 12/25/2021   PLT 266 12/25/2021   Lab Results  Component Value Date   NA 138 12/25/2021   K 4.3 12/25/2021   CO2 25 12/25/2021   GLUCOSE 103 (H) 12/25/2021   BUN 21 12/25/2021   CREATININE 0.83 12/25/2021   BILITOT 0.4 11/23/2021   ALKPHOS 147 (H) 11/23/2021   AST 28 11/23/2021   ALT 17 11/23/2021   PROT 7.0 11/23/2021   ALBUMIN 3.6 11/23/2021   CALCIUM 9.1 12/25/2021   ANIONGAP 6 12/25/2021   Lab Results  Component Value Date   CHOL 143 12/24/2021   Lab Results  Component Value Date   HDL 37 (L) 12/24/2021   Lab Results   Component Value Date   LDLCALC 91 12/24/2021   Lab Results  Component Value Date   TRIG 67 12/24/2021   Lab Results  Component Value Date   CHOLHDL 3.9 12/24/2021   Lab Results  Component Value Date   HGBA1C  5.3 08/24/2018      Assessment & Plan:   Problem List Items Addressed This Visit       Respiratory   Acute sinusitis    Started on prednisone 10 mg for 5 days. Encourage patient to increase hydration.      Relevant Medications   predniSONE (DELTASONE) 10 MG tablet     Other   Annual physical exam - Primary    Encouraged patient to consume a balanced diet and regular exercise regimen. Advised to see an eye doctor and dentist annually.  Lipid panel ordered      Relevant Orders   Lipid panel (Completed)     Meds ordered this encounter  Medications   predniSONE (DELTASONE) 10 MG tablet    Sig: Take 1 tablet (10 mg total) by mouth daily with breakfast.    Dispense:  5 tablet    Refill:  0     Follow-up: No follow-ups on file.    Theresia Lo, NP

## 2021-12-24 ENCOUNTER — Ambulatory Visit (INDEPENDENT_AMBULATORY_CARE_PROVIDER_SITE_OTHER): Payer: BC Managed Care – PPO | Admitting: Nurse Practitioner

## 2021-12-24 ENCOUNTER — Encounter: Payer: Self-pay | Admitting: Nurse Practitioner

## 2021-12-24 VITALS — BP 118/58 | HR 68 | Temp 97.7°F | Ht 59.5 in | Wt 105.7 lb

## 2021-12-24 DIAGNOSIS — Z79899 Other long term (current) drug therapy: Secondary | ICD-10-CM | POA: Diagnosis not present

## 2021-12-24 DIAGNOSIS — Z0000 Encounter for general adult medical examination without abnormal findings: Secondary | ICD-10-CM

## 2021-12-24 DIAGNOSIS — J019 Acute sinusitis, unspecified: Secondary | ICD-10-CM | POA: Diagnosis not present

## 2021-12-24 DIAGNOSIS — I700 Atherosclerosis of aorta: Secondary | ICD-10-CM

## 2021-12-24 MED ORDER — PREDNISONE 10 MG PO TABS: 10.0000 mg | ORAL_TABLET | Freq: Every day | ORAL | 0 refills | Status: DC

## 2021-12-25 ENCOUNTER — Other Ambulatory Visit: Payer: Self-pay | Admitting: *Deleted

## 2021-12-25 ENCOUNTER — Inpatient Hospital Stay: Payer: BC Managed Care – PPO | Attending: Oncology

## 2021-12-25 ENCOUNTER — Inpatient Hospital Stay: Payer: BC Managed Care – PPO

## 2021-12-25 DIAGNOSIS — C253 Malignant neoplasm of pancreatic duct: Secondary | ICD-10-CM | POA: Diagnosis not present

## 2021-12-25 DIAGNOSIS — Z79899 Other long term (current) drug therapy: Secondary | ICD-10-CM | POA: Insufficient documentation

## 2021-12-25 DIAGNOSIS — E876 Hypokalemia: Secondary | ICD-10-CM | POA: Diagnosis not present

## 2021-12-25 DIAGNOSIS — C259 Malignant neoplasm of pancreas, unspecified: Secondary | ICD-10-CM

## 2021-12-25 LAB — CBC WITH DIFFERENTIAL/PLATELET
Abs Immature Granulocytes: 0.01 10*3/uL (ref 0.00–0.07)
Basophils Absolute: 0 10*3/uL (ref 0.0–0.1)
Basophils Relative: 1 %
Eosinophils Absolute: 0.1 10*3/uL (ref 0.0–0.5)
Eosinophils Relative: 4 %
HCT: 32.6 % — ABNORMAL LOW (ref 36.0–46.0)
Hemoglobin: 10.5 g/dL — ABNORMAL LOW (ref 12.0–15.0)
Immature Granulocytes: 0 %
Lymphocytes Relative: 32 %
Lymphs Abs: 1.3 10*3/uL (ref 0.7–4.0)
MCH: 31.9 pg (ref 26.0–34.0)
MCHC: 32.2 g/dL (ref 30.0–36.0)
MCV: 99.1 fL (ref 80.0–100.0)
Monocytes Absolute: 0.3 10*3/uL (ref 0.1–1.0)
Monocytes Relative: 8 %
Neutro Abs: 2.2 10*3/uL (ref 1.7–7.7)
Neutrophils Relative %: 55 %
Platelets: 266 10*3/uL (ref 150–400)
RBC: 3.29 MIL/uL — ABNORMAL LOW (ref 3.87–5.11)
RDW: 12 % (ref 11.5–15.5)
WBC: 4 10*3/uL (ref 4.0–10.5)
nRBC: 0 % (ref 0.0–0.2)

## 2021-12-25 LAB — FERRITIN: Ferritin: 41 ng/mL (ref 11–307)

## 2021-12-25 LAB — LIPID PANEL
Cholesterol: 143 mg/dL (ref <200)
HDL: 37 mg/dL — ABNORMAL LOW (ref >=50)
LDL Cholesterol (Calc): 91 mg/dL (calc)
Non-HDL Cholesterol (Calc): 106 mg/dL (calc) (ref <130)
Total CHOL/HDL Ratio: 3.9 (calc) (ref <5.0)
Triglycerides: 67 mg/dL (ref <150)

## 2021-12-25 LAB — BASIC METABOLIC PANEL
Anion gap: 6 (ref 5–15)
BUN: 21 mg/dL (ref 8–23)
CO2: 25 mmol/L (ref 22–32)
Calcium: 9.1 mg/dL (ref 8.9–10.3)
Chloride: 107 mmol/L (ref 98–111)
Creatinine, Ser: 0.83 mg/dL (ref 0.44–1.00)
GFR, Estimated: >60 mL/min (ref >60)
Glucose, Bld: 103 mg/dL — ABNORMAL HIGH (ref 70–99)
Potassium: 4.3 mmol/L (ref 3.5–5.1)
Sodium: 138 mmol/L (ref 135–145)

## 2021-12-25 LAB — IRON AND TIBC
Iron: 61 ug/dL (ref 28–170)
Saturation Ratios: 17 % (ref 10.4–31.8)
TIBC: 365 ug/dL (ref 250–450)
UIBC: 304 ug/dL

## 2021-12-25 MED ORDER — HEPARIN SOD (PORK) LOCK FLUSH 100 UNIT/ML IV SOLN
500.0000 [IU] | Freq: Once | INTRAVENOUS | Status: AC
  Administered 2021-12-25: 500 [IU] via INTRAVENOUS
  Filled 2021-12-25: qty 5

## 2021-12-25 MED ORDER — SODIUM CHLORIDE 0.9% FLUSH
10.0000 mL | Freq: Once | INTRAVENOUS | Status: AC
  Administered 2021-12-25: 10 mL via INTRAVENOUS
  Filled 2021-12-25: qty 10

## 2021-12-25 NOTE — Progress Notes (Signed)
No IVF or K needed today

## 2021-12-28 ENCOUNTER — Other Ambulatory Visit: Payer: Self-pay | Admitting: Oncology

## 2021-12-28 ENCOUNTER — Encounter: Payer: Self-pay | Admitting: Nurse Practitioner

## 2021-12-28 NOTE — Assessment & Plan Note (Signed)
Encouraged patient to consume a balanced diet and regular exercise regimen. Advised to see an eye doctor and dentist annually.  Lipid panel ordered

## 2021-12-28 NOTE — Telephone Encounter (Signed)
  Component Ref Range & Units 3 d ago (12/25/21) 1 mo ago (11/23/21) 1 mo ago (11/02/21) 3 mo ago (09/29/21) 3 mo ago (09/01/21) 4 mo ago (08/04/21) 5 mo ago (07/21/21)  Potassium 3.5 - 5.1 mmol/L 4.3 2.9 Low  3.5 3.3 Low  3.0 Low  3.7 3.6

## 2021-12-28 NOTE — Assessment & Plan Note (Signed)
Started on prednisone 10 mg for 5 days. Encourage patient to increase hydration.

## 2022-01-25 ENCOUNTER — Inpatient Hospital Stay: Payer: BC Managed Care – PPO

## 2022-01-25 ENCOUNTER — Inpatient Hospital Stay: Payer: BC Managed Care – PPO | Attending: Oncology

## 2022-01-25 VITALS — BP 122/76 | HR 104 | Temp 98.1°F | Resp 18

## 2022-01-25 DIAGNOSIS — E876 Hypokalemia: Secondary | ICD-10-CM

## 2022-01-25 DIAGNOSIS — E860 Dehydration: Secondary | ICD-10-CM

## 2022-01-25 DIAGNOSIS — Z79899 Other long term (current) drug therapy: Secondary | ICD-10-CM | POA: Insufficient documentation

## 2022-01-25 DIAGNOSIS — C259 Malignant neoplasm of pancreas, unspecified: Secondary | ICD-10-CM

## 2022-01-25 DIAGNOSIS — C250 Malignant neoplasm of head of pancreas: Secondary | ICD-10-CM | POA: Diagnosis not present

## 2022-01-25 LAB — CBC WITH DIFFERENTIAL/PLATELET
Abs Immature Granulocytes: 0.01 10*3/uL (ref 0.00–0.07)
Basophils Absolute: 0 10*3/uL (ref 0.0–0.1)
Basophils Relative: 0 %
Eosinophils Absolute: 0.1 10*3/uL (ref 0.0–0.5)
Eosinophils Relative: 1 %
HCT: 34.3 % — ABNORMAL LOW (ref 36.0–46.0)
Hemoglobin: 11.8 g/dL — ABNORMAL LOW (ref 12.0–15.0)
Immature Granulocytes: 0 %
Lymphocytes Relative: 21 %
Lymphs Abs: 1.1 10*3/uL (ref 0.7–4.0)
MCH: 32.7 pg (ref 26.0–34.0)
MCHC: 34.4 g/dL (ref 30.0–36.0)
MCV: 95 fL (ref 80.0–100.0)
Monocytes Absolute: 0.4 10*3/uL (ref 0.1–1.0)
Monocytes Relative: 8 %
Neutro Abs: 3.6 10*3/uL (ref 1.7–7.7)
Neutrophils Relative %: 70 %
Platelets: 218 10*3/uL (ref 150–400)
RBC: 3.61 MIL/uL — ABNORMAL LOW (ref 3.87–5.11)
RDW: 12.1 % (ref 11.5–15.5)
WBC: 5.2 10*3/uL (ref 4.0–10.5)
nRBC: 0 % (ref 0.0–0.2)

## 2022-01-25 LAB — IRON AND TIBC
Iron: 73 ug/dL (ref 28–170)
Saturation Ratios: 22 % (ref 10.4–31.8)
TIBC: 337 ug/dL (ref 250–450)
UIBC: 264 ug/dL

## 2022-01-25 LAB — FERRITIN: Ferritin: 58 ng/mL (ref 11–307)

## 2022-01-25 LAB — BASIC METABOLIC PANEL
Anion gap: 12 (ref 5–15)
BUN: 13 mg/dL (ref 8–23)
CO2: 23 mmol/L (ref 22–32)
Calcium: 8.6 mg/dL — ABNORMAL LOW (ref 8.9–10.3)
Chloride: 101 mmol/L (ref 98–111)
Creatinine, Ser: 1.02 mg/dL — ABNORMAL HIGH (ref 0.44–1.00)
GFR, Estimated: >60 mL/min (ref >60)
Glucose, Bld: 177 mg/dL — ABNORMAL HIGH (ref 70–99)
Potassium: 3 mmol/L — ABNORMAL LOW (ref 3.5–5.1)
Sodium: 136 mmol/L (ref 135–145)

## 2022-01-25 MED ORDER — HEPARIN SOD (PORK) LOCK FLUSH 100 UNIT/ML IV SOLN
500.0000 [IU] | Freq: Once | INTRAVENOUS | Status: AC | PRN
  Administered 2022-01-25: 500 [IU]
  Filled 2022-01-25: qty 5

## 2022-01-25 MED ORDER — SODIUM CHLORIDE 0.9 % IV SOLN: Freq: Once | INTRAVENOUS | Status: DC

## 2022-01-25 MED ORDER — POTASSIUM CHLORIDE 20 MEQ/100ML IV SOLN
20.0000 meq | Freq: Once | INTRAVENOUS | Status: AC
  Administered 2022-01-25: 20 meq via INTRAVENOUS

## 2022-01-25 MED ORDER — SODIUM CHLORIDE 0.9 % IV SOLN
INTRAVENOUS | Status: DC
  Filled 2022-01-25 (×2): qty 250

## 2022-01-25 MED ORDER — ONDANSETRON HCL 4 MG/2ML IJ SOLN
8.0000 mg | Freq: Once | INTRAMUSCULAR | Status: AC
  Administered 2022-01-25: 8 mg via INTRAVENOUS
  Filled 2022-01-25: qty 4

## 2022-01-26 ENCOUNTER — Encounter: Payer: Self-pay | Admitting: Psychiatry

## 2022-01-26 ENCOUNTER — Ambulatory Visit (INDEPENDENT_AMBULATORY_CARE_PROVIDER_SITE_OTHER): Payer: BC Managed Care – PPO | Admitting: Psychiatry

## 2022-01-26 VITALS — BP 118/72 | HR 76 | Ht 59.5 in | Wt 106.0 lb

## 2022-01-26 DIAGNOSIS — F603 Borderline personality disorder: Secondary | ICD-10-CM | POA: Diagnosis not present

## 2022-01-26 DIAGNOSIS — F3178 Bipolar disorder, in full remission, most recent episode mixed: Secondary | ICD-10-CM

## 2022-01-26 DIAGNOSIS — F1221 Cannabis dependence, in remission: Secondary | ICD-10-CM | POA: Diagnosis not present

## 2022-01-26 DIAGNOSIS — F411 Generalized anxiety disorder: Secondary | ICD-10-CM | POA: Diagnosis not present

## 2022-01-26 MED ORDER — DIVALPROEX SODIUM 250 MG PO DR TAB: 250.0000 mg | DELAYED_RELEASE_TABLET | Freq: Every day | ORAL | 1 refills | Status: DC

## 2022-01-26 NOTE — Progress Notes (Unsigned)
[facility] MD OP Progress Note  01/26/2022 4:45 PM Sheryl Perez  MRN:  [id number]  Chief Complaint:  Chief Complaint  Patient presents with   Follow-up   Medication Refill   Anxiety   Depression   HPI: Sheryl Perez is a 61 year old Caucasian female, has a history of bipolar disorder, borderline personality disorder, cannabis use disorder in remission, GAD, married, lives in [place] was evaluated in office today.  Patient with history of pancreatic adenocarcinoma status post definitive Whipple surgery, currently in remission from her cancer.  Continues to follow-up with her providers.  Recently had labs-was found to have low potassium level which was replaced.  Otherwise doing fairly well.  Reports she continues to work, 7 AM to 3:30 PM.  Work is going well.  Reports spouse is supportive.  He is also going to start working first shift and she looks forward to that.  Had a good Thanksgiving holiday with family.  Looks forward to Christmas holiday, she is going to have a break for a week.  Patient reports sleep is good.  Denies any significant anxiety or mood swings.  Denies any suicidality, homicidality or perceptual disturbances.  Currently compliant on the lower dosage of Depakote.  That helps.  Denies side effects.  Does take the propranolol only as needed.  Patient appeared to be alert, oriented to person place time and situation.  3 word memory immediately 3 out of 3 after 5 minutes 2 out of 3.  Patient was able to spell the word 'WORLD" forward and backward.  Attention and focus seem to be good.  Patient denies any other concerns today.  Visit Diagnosis:    ICD-10-CM   1. Bipolar disorder, in full remission, most recent episode mixed (HCC)  F31.78 divalproex (DEPAKOTE) 250 MG DR tablet    2. GAD (generalized anxiety disorder)  F41.1 divalproex (DEPAKOTE) 250 MG DR tablet    3. Borderline personality disorder ([place])  F60.3     4. Cannabis use disorder, moderate, in early  remission ([place])  F12.21       Past Psychiatric History: Reviewed past psychiatric History from progress note on 04/12/2017.  Past trials of risperidone, Abilify, Lamictal, Prozac, trazodone, lithium.  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    Bipolar disorder ([place])    Chest pain, unspecified    Chronic low back pain 10/23/2013   Chronic UTI    COPD (chronic obstructive pulmonary disease) (HCC)    Depression    Dysfunction of eustachian tube    Enlargement of lymph nodes    Family history of brain cancer    Family history of breast cancer    Family history of prostate cancer    History of Bell's palsy Right   Injury, other and unspecified, knee, leg, ankle, and foot    MRSA (methicillin resistant staph aureus) culture positive 07/11/2014   abscess   Other specified disease of hair and hair follicles    Pancreatic cancer ([place])    Pancreatic cancer ([place])    Rectal prolapse    Unspecified symptom associated with female genital organs     Past Surgical History:  Procedure Laterality Date   ABDOMINAL HYSTERECTOMY     bladder tack  2010   CHOLECYSTECTOMY  2002   FOOT SURGERY Bilateral 2004   bunion   GANGLION CYST EXCISION  2002   INCISION AND DRAINAGE ABSCESS  04-16-15   PARTIAL HYSTERECTOMY  2010   uterus removed   PORTA CATH INSERTION N/A 09/11/2020   Procedure: PORTA CATH INSERTION;  Surgeon: Algernon Huxley, MD;  Location: [facility];  Service: Cardiovascular;  Laterality: N/A;    Family Psychiatric History: Reviewed family psychiatric history from progress note on 04/12/2017.  Family History:  Family History  Problem Relation Age of Onset   Diabetes Father    Cancer Father        brain tumor   Depression Father    Depression Sister    Alcohol abuse Brother    Depression Brother    Leukemia Maternal Aunt    Prostate cancer Maternal Uncle    Breast cancer Paternal Aunt         dx >50   Heart disease Maternal Grandmother    Heart attack Maternal Grandmother    Stroke Maternal Grandmother    Parkinson's disease Maternal Grandfather    Leukemia Paternal Grandmother    Diabetes Paternal Grandfather    Breast cancer Cousin    Brain cancer Niece        dx under 40    Social History: Reviewed social history from progress note on 04/12/2017. Social History   Socioeconomic History   Marital status: Married    Spouse name: Not on file   Number of children: 3   Years of education: hs   Highest education level: Some college, no degree  Occupational History   Not on file  Tobacco Use   Smoking status: Former    Packs/day: 0.50    Years: 40.00    Total pack years: 20.00    Types: Cigarettes    Quit date: 07/14/2020    Years since quitting: 1.5   Smokeless tobacco: Never  Vaping Use   Vaping Use: Never used  Substance and Sexual Activity   Alcohol use: No    Alcohol/week: 1.0 standard drink of alcohol    Types: 1 Glasses of wine per week   Drug use: Yes    Frequency: 7.0 times per week    Types: Marijuana   Sexual activity: Yes    Birth control/protection: None  Other Topics Concern   Not on file  Social History Narrative   Patient lives at home with her husband Sheryl Perez).   Patient works full time.   Education CNA    Right handed.   Caffeine Tea , Soda.   Social Determinants of Health   Financial Resource Strain: High Risk (01/10/2017)   Overall Financial Resource Strain (CARDIA)    Difficulty of Paying Living Expenses: Hard  Food Insecurity: No Food Insecurity (01/10/2017)   Hunger Vital Sign    Worried About Running Out of Food in the Last Year: Never true    Ran Out of Food in the Last Year: Never true  Transportation Needs: No Transportation Needs (01/10/2017)   PRAPARE - Hydrologist (Medical): No    Lack of Transportation (Non-Medical): No  Physical Activity: Inactive (01/10/2017)   Exercise Vital Sign     Days of Exercise per Week: 0 days    Minutes of Exercise per Session: 0 min  Stress: Stress Concern Present (01/10/2017)   [place]    Feeling of Stress : Very much  Social Connections: Somewhat Isolated (01/10/2017)   Social Connection and Isolation Panel [NHANES]    Frequency of Communication with Friends and Family: More than three times  a week    Frequency of Social Gatherings with Friends and Family: More than three times a week    Attends Religious Services: More than 4 times per year    Active Member of [employer] or Organizations: No    Attends Archivist Meetings: Never    Marital Status: Separated    Allergies:  Allergies  Allergen Reactions   Sulfacetamide Sodium Swelling    Diarrhea,dizziness, tongue swelling   Other Other (See Comments) and Swelling    PT states that anything with the sides effects "may cause rash" she cannot take due to her psoriasis, it will make the patches on her hand so thick she can't use her hands.  Diarrhea,dizziness, tongue swelling   Oxycontin [Oxycodone Hcl] Nausea And Vomiting   Sulfa Antibiotics Swelling    Diarrhea,dizziness, tongue swelling   Abilify [Aripiprazole] Other (See Comments)   Cephalosporins Rash   Erythromycin Other (See Comments)    Abd. pain Abd. pain Other reaction(s): Other (See Comments) Abd. pain   Lamotrigine Rash and Other (See Comments)    psorasis worsen psorasis worsen   Risperidone Other (See Comments)    tremors tremors tremors    Metabolic Disorder Labs: Lab Results  Component Value Date   HGBA1C 5.3 08/24/2018   MPG 105.41 08/24/2018   MPG 105.41 04/14/2017   Lab Results  Component Value Date   PROLACTIN 9.3 08/24/2018   PROLACTIN 12.5 04/14/2017   Lab Results  Component Value Date   CHOL 143 12/24/2021   TRIG 67 12/24/2021   HDL 37 (L) 12/24/2021   CHOLHDL 3.9 12/24/2021   VLDL 37 08/24/2018   LDLCALC 91 12/24/2021    LDLCALC 123 (H) 12/12/2019   Lab Results  Component Value Date   TSH 1.241 06/16/2021   TSH 1.27 12/12/2019    Therapeutic Level Labs: Lab Results  Component Value Date   LITHIUM 0.34 (L) 04/14/2017   LITHIUM 0.3 (L) 06/15/2016   Lab Results  Component Value Date   VALPROATE 25 (L) 11/19/2020   VALPROATE 17 (L) 02/22/2020   No results found for: "CBMZ"  Current Medications: Current Outpatient Medications  Medication Sig Dispense Refill   albuterol (VENTOLIN HFA) 108 (90 Base) MCG/ACT inhaler TAKE 2 PUFFS BY MOUTH EVERY 6 HOURS AS NEEDED FOR WHEEZE OR SHORTNESS OF BREATH 8 each 4   Ascorbic Acid (VITAMIN C) 1000 MG tablet Take 1,000 mg by mouth daily.     calcium-vitamin D (OSCAL WITH D) 500-5 MG-MCG tablet Take 1 tablet by mouth daily.     famotidine (PEPCID) 20 MG tablet Take 1 tablet by mouth 2 (two) times daily.     fluticasone (FLONASE) 50 MCG/ACT nasal spray Place 2 sprays into both nostrils as needed for allergies or rhinitis.     loperamide (IMODIUM A-D) 2 MG tablet Take 2 at onset of diarrhea, then 1 every 2hrs until 12hr without a BM. May take 2 tab every 4hrs at bedtime. If diarrhea recurs repeat. 100 tablet 1   loratadine (CLARITIN) 10 MG tablet Take 10 mg by mouth daily.     Multiple Vitamin (MULTIVITAMIN) tablet Take 1 tablet by mouth daily.     potassium chloride SA (KLOR-CON M20) 20 MEQ tablet TAKE 1 TABLET BY MOUTH TWICE A DAY 180 tablet 0   propranolol (INDERAL) 20 MG tablet Take 1 tablet (20 mg total) by mouth 3 (three) times daily. 270 tablet 1   divalproex (DEPAKOTE) 250 MG DR tablet Take 1 tablet (250 mg total)  by mouth daily. 90 tablet 1   No current facility-administered medications for this visit.   Facility-Administered Medications Ordered in Other Visits  Medication Dose Route Frequency Provider Last Rate Last Admin   heparin lock flush 100 UNIT/ML injection            heparin lock flush 100 UNIT/ML injection            heparin lock flush 100  unit/mL  500 Units Intravenous Once Sindy Guadeloupe, MD       heparin lock flush 100 unit/mL  500 Units Intravenous Once Sindy Guadeloupe, MD       sodium chloride flush (NS) 0.9 % injection 10 mL  10 mL Intracatheter PRN Sindy Guadeloupe, MD   10 mL at 11/03/20 1441   sodium chloride flush (NS) 0.9 % injection 10 mL  10 mL Intravenous PRN Sindy Guadeloupe, MD         Musculoskeletal: Strength & Muscle Tone: within normal limits Gait & Station: normal Patient leans: N/A  Psychiatric Specialty Exam: Review of Systems  Psychiatric/Behavioral: Negative.    All other systems reviewed and are negative.   Blood pressure 118/72, pulse 76, height 4' 11.5" (1.511 m), weight 106 lb (48.1 kg), last menstrual period 02/09/2008.Body mass index is 21.05 kg/m.  General Appearance: Casual  Eye Contact:  Good  Speech:  Clear and Coherent  Volume:  Normal  Mood:  Euthymic  Affect:  Congruent  Thought Process:  Goal Directed and Descriptions of Associations: Intact  Orientation:  Full (Time, Place, and Person)  Thought Content: Logical   Suicidal Thoughts:  No  Homicidal Thoughts:  No  Memory:  Immediate;   Fair Recent;   Fair Remote;   Fair  Judgement:  Fair  Insight:  Fair  Psychomotor Activity:  Normal  Concentration:  Concentration: Fair and Attention Span: Fair  Recall:  [employer] of Knowledge: Fair  Language: Fair  Akathisia:  No  Handed:  Right  AIMS (if indicated): not done  Assets:  Communication Skills Desire for [employer] Talents/Skills Transportation  ADL's:  Intact  Cognition: WNL  Sleep:  Fair   Screenings: [facility] Office Visit from 07/17/2021 in [facility] Video Visit from 04/21/2020 in [facility] Office Visit from 05/03/2016 in [facility] Admission (Discharged) from 01/15/2015 in [facility] Total Score 0 0 0  0      AUDIT    Flowsheet Row Admission (Discharged) from 01/15/2015 in [facility]  Alcohol Use Disorder Identification Test Final Score (AUDIT) 1      GAD-7    Flowsheet Row Office Visit from 01/26/2022 in [facility] Office Visit from 11/04/2021 in [facility] Video Visit from 09/08/2020 in [facility] Visit from 08/19/2020 in [facility] Office Visit from 04/18/2015 in [facility]  Total GAD-7 Score 0 0 '6 2 14      '$ PHQ2-9    [facility] Office Visit from 01/26/2022 in [facility] Office Visit from 11/04/2021 in [facility] Office Visit from 07/17/2021 in [facility] Office Visit from 07/16/2021 in [facility] Office Visit from 12/23/2020 in [facility]  PHQ-2 Total Score 0 0 1 0 0  PHQ-9 Total Score 0 -- 4 0 --  [place] Office Visit from 01/26/2022 in [facility] Office Visit from 11/04/2021 in [facility] Office Visit from 07/17/2021 in [facility] No Risk No Risk No Risk        Assessment and Plan: Sheryl Perez is a 61 year old Caucasian female who has a history of bipolar disorder, cannabis use disorder, tobacco use disorder, pancreatic adenocarcinoma currently in remission was evaluated in office today.  Patient is currently stable.  Plan as noted below.  Plan Bipolar current remission Continue Depakote 250 mg once daily. Depakote level 11/19/2020-25-subtherapeutic.  However mood symptoms are currently stable and does not need medication readjustment. Pending Depakote level-patient was advised to get Depakote level last visit-still pending.  Agrees to go to [facility] lab.  Order is in the  system.  GAD-stable Continue propranolol 20 mg p.o. 3 times daily.  Uses it only as needed  Cannabis use disorder in remission Will monitor closely  High risk medication use-Ordered labs Depakote level -patient encouraged to get it done at [facility] lab.  Reviewed most recent labs including sodium level-01/25/2022-within normal limits.  Platelet count-within normal limits.  Follow-up in clinic in 4 to 5 months or sooner if needed.  This note was generated in part or whole with voice recognition software. Voice recognition is usually quite accurate but there are transcription errors that can and very often do occur. I apologize for any typographical errors that were not detected and corrected.     Ursula Alert, MD 01/27/2022, 9:21 AM

## 2022-01-27 ENCOUNTER — Other Ambulatory Visit: Payer: Self-pay | Admitting: Internal Medicine

## 2022-01-29 DIAGNOSIS — C250 Malignant neoplasm of head of pancreas: Secondary | ICD-10-CM | POA: Diagnosis not present

## 2022-01-29 DIAGNOSIS — K760 Fatty (change of) liver, not elsewhere classified: Secondary | ICD-10-CM | POA: Diagnosis not present

## 2022-01-29 DIAGNOSIS — C259 Malignant neoplasm of pancreas, unspecified: Secondary | ICD-10-CM | POA: Diagnosis not present

## 2022-02-26 ENCOUNTER — Inpatient Hospital Stay: Payer: BC Managed Care – PPO | Attending: Oncology | Admitting: Oncology

## 2022-02-26 ENCOUNTER — Encounter: Payer: Self-pay | Admitting: Oncology

## 2022-02-26 ENCOUNTER — Inpatient Hospital Stay: Payer: BC Managed Care – PPO

## 2022-02-26 VITALS — BP 126/42 | HR 62 | Temp 97.3°F | Resp 17 | Wt 105.0 lb

## 2022-02-26 DIAGNOSIS — Z8507 Personal history of malignant neoplasm of pancreas: Secondary | ICD-10-CM

## 2022-02-26 DIAGNOSIS — Z08 Encounter for follow-up examination after completed treatment for malignant neoplasm: Secondary | ICD-10-CM

## 2022-02-26 DIAGNOSIS — G8929 Other chronic pain: Secondary | ICD-10-CM | POA: Insufficient documentation

## 2022-02-26 DIAGNOSIS — R1012 Left upper quadrant pain: Secondary | ICD-10-CM | POA: Diagnosis not present

## 2022-02-26 DIAGNOSIS — F1290 Cannabis use, unspecified, uncomplicated: Secondary | ICD-10-CM | POA: Diagnosis not present

## 2022-02-26 DIAGNOSIS — Z803 Family history of malignant neoplasm of breast: Secondary | ICD-10-CM | POA: Insufficient documentation

## 2022-02-26 DIAGNOSIS — Z9049 Acquired absence of other specified parts of digestive tract: Secondary | ICD-10-CM | POA: Insufficient documentation

## 2022-02-26 DIAGNOSIS — Z8042 Family history of malignant neoplasm of prostate: Secondary | ICD-10-CM | POA: Insufficient documentation

## 2022-02-26 DIAGNOSIS — Z818 Family history of other mental and behavioral disorders: Secondary | ICD-10-CM | POA: Insufficient documentation

## 2022-02-26 DIAGNOSIS — R1013 Epigastric pain: Secondary | ICD-10-CM | POA: Insufficient documentation

## 2022-02-26 DIAGNOSIS — Z8249 Family history of ischemic heart disease and other diseases of the circulatory system: Secondary | ICD-10-CM | POA: Diagnosis not present

## 2022-02-26 DIAGNOSIS — Z823 Family history of stroke: Secondary | ICD-10-CM | POA: Insufficient documentation

## 2022-02-26 DIAGNOSIS — Z833 Family history of diabetes mellitus: Secondary | ICD-10-CM | POA: Diagnosis not present

## 2022-02-26 DIAGNOSIS — Z79899 Other long term (current) drug therapy: Secondary | ICD-10-CM | POA: Diagnosis not present

## 2022-02-26 DIAGNOSIS — Z882 Allergy status to sulfonamides status: Secondary | ICD-10-CM | POA: Diagnosis not present

## 2022-02-26 DIAGNOSIS — Z87891 Personal history of nicotine dependence: Secondary | ICD-10-CM | POA: Insufficient documentation

## 2022-02-26 DIAGNOSIS — Z885 Allergy status to narcotic agent status: Secondary | ICD-10-CM | POA: Insufficient documentation

## 2022-02-26 DIAGNOSIS — C250 Malignant neoplasm of head of pancreas: Secondary | ICD-10-CM | POA: Insufficient documentation

## 2022-02-26 DIAGNOSIS — Z881 Allergy status to other antibiotic agents status: Secondary | ICD-10-CM | POA: Diagnosis not present

## 2022-02-26 DIAGNOSIS — C259 Malignant neoplasm of pancreas, unspecified: Secondary | ICD-10-CM

## 2022-02-26 DIAGNOSIS — Z808 Family history of malignant neoplasm of other organs or systems: Secondary | ICD-10-CM | POA: Insufficient documentation

## 2022-02-26 DIAGNOSIS — K589 Irritable bowel syndrome without diarrhea: Secondary | ICD-10-CM | POA: Diagnosis not present

## 2022-02-26 DIAGNOSIS — E876 Hypokalemia: Secondary | ICD-10-CM | POA: Diagnosis not present

## 2022-02-26 DIAGNOSIS — F319 Bipolar disorder, unspecified: Secondary | ICD-10-CM | POA: Diagnosis not present

## 2022-02-26 DIAGNOSIS — Z888 Allergy status to other drugs, medicaments and biological substances status: Secondary | ICD-10-CM | POA: Diagnosis not present

## 2022-02-26 DIAGNOSIS — Z5986 Financial insecurity: Secondary | ICD-10-CM | POA: Insufficient documentation

## 2022-02-26 DIAGNOSIS — Z90710 Acquired absence of both cervix and uterus: Secondary | ICD-10-CM | POA: Insufficient documentation

## 2022-02-26 DIAGNOSIS — Z87440 Personal history of urinary (tract) infections: Secondary | ICD-10-CM | POA: Insufficient documentation

## 2022-02-26 DIAGNOSIS — Z806 Family history of leukemia: Secondary | ICD-10-CM | POA: Insufficient documentation

## 2022-02-26 LAB — COMPREHENSIVE METABOLIC PANEL
ALT: 17 U/L (ref 0–44)
AST: 25 U/L (ref 15–41)
Albumin: 3.6 g/dL (ref 3.5–5.0)
Alkaline Phosphatase: 139 U/L — ABNORMAL HIGH (ref 38–126)
Anion gap: 11 (ref 5–15)
BUN: 16 mg/dL (ref 8–23)
CO2: 24 mmol/L (ref 22–32)
Calcium: 9.1 mg/dL (ref 8.9–10.3)
Chloride: 104 mmol/L (ref 98–111)
Creatinine, Ser: 0.82 mg/dL (ref 0.44–1.00)
GFR, Estimated: >60 mL/min (ref >60)
Glucose, Bld: 100 mg/dL — ABNORMAL HIGH (ref 70–99)
Potassium: 4 mmol/L (ref 3.5–5.1)
Sodium: 139 mmol/L (ref 135–145)
Total Bilirubin: 0.6 mg/dL (ref 0.3–1.2)
Total Protein: 7.5 g/dL (ref 6.5–8.1)

## 2022-02-26 LAB — CBC WITH DIFFERENTIAL/PLATELET
Abs Immature Granulocytes: 0.03 10*3/uL (ref 0.00–0.07)
Basophils Absolute: 0 10*3/uL (ref 0.0–0.1)
Basophils Relative: 0 %
Eosinophils Absolute: 0.2 10*3/uL (ref 0.0–0.5)
Eosinophils Relative: 2 %
HCT: 35.1 % — ABNORMAL LOW (ref 36.0–46.0)
Hemoglobin: 11.7 g/dL — ABNORMAL LOW (ref 12.0–15.0)
Immature Granulocytes: 0 %
Lymphocytes Relative: 18 %
Lymphs Abs: 1.3 10*3/uL (ref 0.7–4.0)
MCH: 31.7 pg (ref 26.0–34.0)
MCHC: 33.3 g/dL (ref 30.0–36.0)
MCV: 95.1 fL (ref 80.0–100.0)
Monocytes Absolute: 0.6 10*3/uL (ref 0.1–1.0)
Monocytes Relative: 8 %
Neutro Abs: 5.2 10*3/uL (ref 1.7–7.7)
Neutrophils Relative %: 72 %
Platelets: 259 10*3/uL (ref 150–400)
RBC: 3.69 MIL/uL — ABNORMAL LOW (ref 3.87–5.11)
RDW: 12.7 % (ref 11.5–15.5)
WBC: 7.3 10*3/uL (ref 4.0–10.5)
nRBC: 0 % (ref 0.0–0.2)

## 2022-02-26 NOTE — Progress Notes (Signed)
Hematology/Oncology Consult note [facility]  [phone] [phone]  Patient Care Team: Cletis Athens, MD as PCP - General (Internal Medicine) Grant Fontana, [place] as Referring Physician (Chiropractic Medicine) Christene Lye, MD (General Surgery) Sindy Guadeloupe, MD as Consulting Physician (Hematology and Oncology)   Name of the patient: Sheryl Perez  [id number]  1960/06/03   Date of visit: 02/26/22  Diagnosis- pancreatic adenocarcinoma stage II apT2 N1 M0   Chief complaint/ Reason for visit-routine follow-up of pancreatic cancer  Heme/Onc history: patient is a 62 year old female with a history of irritable bowel disease for which she follows up with [facility].  She has undergone EGD and colonoscopy with them in the past.  More recently patient was complaining of left upper quadrant and epigastric abdominal pain which causes sudden episodes of abdominal spasms and was prescribed as needed tramadol for it.  These episodes of pain are unrelated to food and are dull aching or throbbing.  She has undergone cholecystectomy in the past.  She underwent CT abdomen and pelvis with contrast in the ER on 08/04/2020 Which did not reveal any acute pathology which showed a possible hypoechoic 1.9 cm right hepatic lobe lesion.  This was followed by an MRI which showed no discrete lesion in the liver and the area of concern noted on CT scan was more likely to represent variable fat deposition.  However there was loss of normal T1 signal in the peripheral pancreas and a potential small pancreatic lesion suspicious for early pancreatic adenocarcinoma in the neck of the pancreas.     Patient underwent EUS by Dr. Mont Dutton which showed an irregular mass in the pancreatic neck measuring 15 x 11 mm.  FNA showed adenocarcinoma.  Pancreatic duct measured 1.1 mm in the head with abrupt dilatation in the region of the mass to 4.1 mm.  No abnormality noted in the common  bile duct and hepatic duct.  Celiac region showed no significant endosonographic abnormality.  No lymphadenopathy was seen.  CA 19-9 mildly elevated at 35   Patient was seen by Dr. Hyman Hopes from pancreaticobiliary surgery at [facility] for surgical opinion.  Although patient has a small lesion involving the pancreatic neck, there was a concern for tumor extension towards the splenic vein and narrowing of portal splenic confluence.  Patient received 3 months of neoadjuvant modified FOLFIRINOX chemotherapyAnd then underwent definitive Whipple surgery at [facility] with Dr. Hyman Hopes on 01/13/2021.  Final pathology showed 2.3 cm grade 2 moderately differentiated adenocarcinoma with lymphovascular and perineural invasion.  Treatment effect present with residual cancer showing evident tumor regression but more than single cells or rare small group of cancer cells] partial response.  Margins negative.  1 out of 22 lymph nodes positive for malignancy.   Patient completed adjuvant chemotherapy in March 2023.  She remains in remission    Interval history-patient continues to do well.  She is working full-time and having no issues.  She is on oral potassium once daily.  Appetite and weight have remained stable.   ECOG PS- 1 Pain scale- 0   Review of systems- Review of Systems  Constitutional:  Negative for chills, fever, malaise/fatigue and weight loss.  HENT:  Negative for congestion, ear discharge and nosebleeds.   Eyes:  Negative for blurred vision.  Respiratory:  Negative for cough, hemoptysis, sputum production, shortness of breath and wheezing.   Cardiovascular:  Negative for chest pain, palpitations, orthopnea and claudication.  Gastrointestinal:  Negative for abdominal pain, blood in  stool, constipation, diarrhea, heartburn, melena, nausea and vomiting.  Genitourinary:  Negative for dysuria, flank pain, frequency, hematuria and urgency.  Musculoskeletal:  Negative for back pain, joint pain and myalgias.   Skin:  Negative for rash.  Neurological:  Negative for dizziness, tingling, focal weakness, seizures, weakness and headaches.  Endo/Heme/Allergies:  Does not bruise/bleed easily.  Psychiatric/Behavioral:  Negative for depression and suicidal ideas. The patient does not have insomnia.       Allergies  Allergen Reactions   Sulfacetamide Sodium Swelling    Diarrhea,dizziness, tongue swelling   Other Other (See Comments) and Swelling    PT states that anything with the sides effects "may cause rash" she cannot take due to her psoriasis, it will make the patches on her hand so thick she can't use her hands.  Diarrhea,dizziness, tongue swelling   Oxycontin [Oxycodone Hcl] Nausea And Vomiting   Sulfa Antibiotics Swelling    Diarrhea,dizziness, tongue swelling   Abilify [Aripiprazole] Other (See Comments)   Cephalosporins Rash   Erythromycin Other (See Comments)    Abd. pain Abd. pain Other reaction(s): Other (See Comments) Abd. 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    Bipolar disorder (HCC)    Chest pain, unspecified    Chronic low back pain 10/23/2013   Chronic UTI    COPD (chronic obstructive pulmonary disease) (HCC)    Depression    Dysfunction of eustachian tube    Enlargement of lymph nodes    Family history of brain cancer    Family history of breast cancer    Family history of prostate cancer    History of Bell's palsy Right   Injury, other and unspecified, knee, leg, ankle, and foot    MRSA (methicillin resistant staph aureus) culture positive 07/11/2014   abscess   Other specified disease of hair and hair follicles    Pancreatic cancer ([place])    Pancreatic cancer ([place])    Rectal prolapse    Unspecified symptom associated with female genital  organs      Past Surgical History:  Procedure Laterality Date   ABDOMINAL HYSTERECTOMY     bladder tack  2010   CHOLECYSTECTOMY  2002   FOOT SURGERY Bilateral 2004   bunion   GANGLION CYST EXCISION  2002   INCISION AND DRAINAGE ABSCESS  04-16-15   PARTIAL HYSTERECTOMY  2010   uterus removed   PORTA CATH INSERTION N/A 09/11/2020   Procedure: PORTA CATH INSERTION;  Surgeon: Algernon Huxley, MD;  Location: [facility];  Service: Cardiovascular;  Laterality: N/A;    Social History   Socioeconomic History   Marital status: Married    Spouse name: Not on file   Number of children: 3   Years of education: hs   Highest education level: Some college, no degree  Occupational History   Not on file  Tobacco Use   Smoking status: Former    Packs/day: 0.50    Years: 40.00    Total pack years: 20.00    Types: Cigarettes    Quit date: 07/14/2020    Years since quitting: 1.6   Smokeless tobacco: Never  Vaping Use   Vaping Use: Never used  Substance and Sexual Activity   Alcohol  use: No    Alcohol/week: 1.0 standard drink of alcohol    Types: 1 Glasses of wine per week   Drug use: Yes    Frequency: 7.0 times per week    Types: Marijuana   Sexual activity: Yes    Birth control/protection: None  Other Topics Concern   Not on file  Social History Narrative   Patient lives at home with her husband [name]).   Patient works full time.   Education CNA    Right handed.   Caffeine Tea , Soda.   Social Determinants of Health   Financial Resource Strain: High Risk (01/10/2017)   Overall Financial Resource Strain (CARDIA)    Difficulty of Paying Living Expenses: Hard  Food Insecurity: No Food Insecurity (01/10/2017)   Hunger Vital Sign    Worried About Running Out of Food in the Last Year: Never true    Ran Out of Food in the Last Year: Never true  Transportation Needs: No Transportation Needs (01/10/2017)   PRAPARE - Hydrologist (Medical): No     Lack of Transportation (Non-Medical): No  Physical Activity: Inactive (01/10/2017)   Exercise Vital Sign    Days of Exercise per Week: 0 days    Minutes of Exercise per Session: 0 min  Stress: Stress Concern Present (01/10/2017)   Omega    Feeling of Stress : Very much  Social Connections: Somewhat Isolated (01/10/2017)   Social Connection and Isolation Panel [NHANES]    Frequency of Communication with Friends and Family: More than three times a week    Frequency of Social Gatherings with Friends and Family: More than three times a week    Attends Religious Services: More than 4 times per year    Active Member of [employer] or Organizations: No    Attends Archivist Meetings: Never    Marital Status: Separated  Intimate Partner Violence: Not At Risk (01/10/2017)   Humiliation, Afraid, Rape, and Kick questionnaire    Fear of Current or Ex-Partner: No    Emotionally Abused: No    Physically Abused: No    Sexually Abused: No    Family History  Problem Relation Age of Onset   Diabetes Father    Cancer Father        brain tumor   Depression Father    Depression Sister    Alcohol abuse Brother    Depression Brother    Leukemia Maternal Aunt    Prostate cancer Maternal Uncle    Breast cancer Paternal Aunt        dx >50   Heart disease Maternal Grandmother    Heart attack Maternal Grandmother    Stroke Maternal Grandmother    Parkinson's disease Maternal Grandfather    Leukemia Paternal Grandmother    Diabetes Paternal Grandfather    Breast cancer Cousin    Brain cancer Niece        dx under 50     Current Outpatient Medications:    albuterol (VENTOLIN HFA) 108 (90 Base) MCG/ACT inhaler, TAKE 2 PUFFS BY MOUTH EVERY 6 HOURS AS NEEDED FOR WHEEZE OR SHORTNESS OF BREATH, Disp: 8 each, Rfl: 4   Ascorbic Acid (VITAMIN C) 1000 MG tablet, Take 1,000 mg by mouth daily., Disp: , Rfl:    calcium-vitamin D (OSCAL  WITH D) 500-5 MG-MCG tablet, Take 1 tablet by mouth daily., Disp: , Rfl:    divalproex (DEPAKOTE) 250 MG DR  tablet, Take 1 tablet (250 mg total) by mouth daily., Disp: 90 tablet, Rfl: 1   famotidine (PEPCID) 20 MG tablet, Take 1 tablet by mouth 2 (two) times daily., Disp: , Rfl:    fluticasone (FLONASE) 50 MCG/ACT nasal spray, Place 2 sprays into both nostrils as needed for allergies or rhinitis., Disp: , Rfl:    loperamide (IMODIUM A-D) 2 MG tablet, Take 2 at onset of diarrhea, then 1 every 2hrs until 12hr without a BM. May take 2 tab every 4hrs at bedtime. If diarrhea recurs repeat., Disp: 100 tablet, Rfl: 1   loratadine (CLARITIN) 10 MG tablet, TAKE 1 TABLET BY MOUTH EVERY DAY, Disp: 90 tablet, Rfl: 1   Multiple Vitamin (MULTIVITAMIN) tablet, Take 1 tablet by mouth daily., Disp: , Rfl:    potassium chloride SA (KLOR-CON M20) 20 MEQ tablet, TAKE 1 TABLET BY MOUTH TWICE A DAY, Disp: 180 tablet, Rfl: 0   propranolol (INDERAL) 20 MG tablet, Take 1 tablet (20 mg total) by mouth 3 (three) times daily., Disp: 270 tablet, Rfl: 1 No current facility-administered medications for this visit.  Facility-Administered Medications Ordered in Other Visits:    heparin lock flush 100 UNIT/ML injection, , , ,    heparin lock flush 100 UNIT/ML injection, , , ,    heparin lock flush 100 unit/mL, 500 Units, Intravenous, Once, Sindy Guadeloupe, MD   heparin lock flush 100 unit/mL, 500 Units, Intravenous, Once, Sindy Guadeloupe, MD   sodium chloride flush (NS) 0.9 % injection 10 mL, 10 mL, Intracatheter, PRN, Sindy Guadeloupe, MD, 10 mL at 11/03/20 1441   sodium chloride flush (NS) 0.9 % injection 10 mL, 10 mL, Intravenous, PRN, Sindy Guadeloupe, MD  Physical exam:  Vitals:   02/26/22 0952  BP: (!) 126/42  Pulse: 62  Resp: 17  Temp: (!) 97.3 F (36.3 C)  TempSrc: Tympanic  SpO2: 99%  Weight: 105 lb (47.6 kg)   Physical Exam Cardiovascular:     Rate and Rhythm: Normal rate and regular rhythm.     Heart sounds:  Normal heart sounds.  Pulmonary:     Effort: Pulmonary effort is normal.     Breath sounds: Normal breath sounds.  Abdominal:     General: Bowel sounds are normal.     Palpations: Abdomen is soft.  Skin:    General: Skin is warm and dry.  Neurological:     Mental Status: She is alert and oriented to person, place, and time.         Latest Ref Rng & Units 02/26/2022    9:11 AM  CMP  Glucose 70 - 99 mg/dL 100   BUN 8 - 23 mg/dL 16   Creatinine 0.44 - 1.00 mg/dL 0.82   Sodium 135 - 145 mmol/L 139   Potassium 3.5 - 5.1 mmol/L 4.0   Chloride 98 - 111 mmol/L 104   CO2 22 - 32 mmol/L 24   Calcium 8.9 - 10.3 mg/dL 9.1   Total Protein 6.5 - 8.1 g/dL 7.5   Total Bilirubin 0.3 - 1.2 mg/dL 0.6   Alkaline Phos 38 - 126 U/L 139   AST 15 - 41 U/L 25   ALT 0 - 44 U/L 17       Latest Ref Rng & Units 02/26/2022    9:11 AM  CBC  WBC 4.0 - 10.5 K/uL 7.3   Hemoglobin 12.0 - 15.0 g/dL 11.7   Hematocrit 36.0 - 46.0 % 35.1   Platelets 150 -  400 K/uL 259     No images are attached to the encounter.  No results found.   Assessment and plan- Patient is a 62 y.o. female with stage II pancreatic adenocarcinoma T2 N0 M0 s/p neoadjuvant and adjuvant modified FOLFIRINOX chemotherapy completed in April 2023.  She is here for routine follow-up  Patient had CT chest abdomen and pelvis with contrast pancreatic protocol done at [place] in the December 2023  which did not show any evidence of recurrent or progressive disease.  She remains in remission as far as pancreatic cancer is concerned.  CA 19-9 from today is pending.   Patient still has a port in place as she was requiring IV fluids and frequent potassium infusions for her hypokalemia.  I will repeat her labs in 6 and 12 weeks and see her back in 12 weeks.  If overall her potassium levels remained stable without the need for any IV infusions we will get her port out at that time.  Normocytic anemia: Improving continue to monitor   Visit  Diagnosis 1. Encounter for follow-up surveillance of pancreatic cancer   2. Hypokalemia      Dr. Randa Evens, MD, MPH [facility] [phone] 02/26/2022 4:15 PM

## 2022-02-26 NOTE — Progress Notes (Signed)
Patient here for oncology follow-up appointment, expresses no new concerns at this time.

## 2022-02-27 LAB — CANCER ANTIGEN 19-9: CA 19-9: 36 U/mL — ABNORMAL HIGH (ref 0–35)

## 2022-04-09 ENCOUNTER — Inpatient Hospital Stay: Payer: BC Managed Care – PPO | Attending: Oncology

## 2022-04-09 DIAGNOSIS — C250 Malignant neoplasm of head of pancreas: Secondary | ICD-10-CM | POA: Diagnosis present

## 2022-04-09 DIAGNOSIS — Z95828 Presence of other vascular implants and grafts: Secondary | ICD-10-CM

## 2022-04-09 DIAGNOSIS — E876 Hypokalemia: Secondary | ICD-10-CM | POA: Insufficient documentation

## 2022-04-09 DIAGNOSIS — Z79899 Other long term (current) drug therapy: Secondary | ICD-10-CM | POA: Diagnosis not present

## 2022-04-09 DIAGNOSIS — Z08 Encounter for follow-up examination after completed treatment for malignant neoplasm: Secondary | ICD-10-CM

## 2022-04-09 LAB — COMPREHENSIVE METABOLIC PANEL
ALT: 24 U/L (ref 0–44)
AST: 33 U/L (ref 15–41)
Albumin: 3.8 g/dL (ref 3.5–5.0)
Alkaline Phosphatase: 116 U/L (ref 38–126)
Anion gap: 8 (ref 5–15)
BUN: 23 mg/dL (ref 8–23)
CO2: 24 mmol/L (ref 22–32)
Calcium: 9 mg/dL (ref 8.9–10.3)
Chloride: 104 mmol/L (ref 98–111)
Creatinine, Ser: 0.8 mg/dL (ref 0.44–1.00)
GFR, Estimated: >60 mL/min (ref >60)
Glucose, Bld: 103 mg/dL — ABNORMAL HIGH (ref 70–99)
Potassium: 3.9 mmol/L (ref 3.5–5.1)
Sodium: 136 mmol/L (ref 135–145)
Total Bilirubin: 0.4 mg/dL (ref 0.3–1.2)
Total Protein: 7.3 g/dL (ref 6.5–8.1)

## 2022-04-09 LAB — CBC WITH DIFFERENTIAL/PLATELET
Abs Immature Granulocytes: 0.01 10*3/uL (ref 0.00–0.07)
Basophils Absolute: 0 10*3/uL (ref 0.0–0.1)
Basophils Relative: 1 %
Eosinophils Absolute: 0.2 10*3/uL (ref 0.0–0.5)
Eosinophils Relative: 3 %
HCT: 32.3 % — ABNORMAL LOW (ref 36.0–46.0)
Hemoglobin: 10.5 g/dL — ABNORMAL LOW (ref 12.0–15.0)
Immature Granulocytes: 0 %
Lymphocytes Relative: 31 %
Lymphs Abs: 1.6 10*3/uL (ref 0.7–4.0)
MCH: 32.3 pg (ref 26.0–34.0)
MCHC: 32.5 g/dL (ref 30.0–36.0)
MCV: 99.4 fL (ref 80.0–100.0)
Monocytes Absolute: 0.5 10*3/uL (ref 0.1–1.0)
Monocytes Relative: 10 %
Neutro Abs: 2.9 10*3/uL (ref 1.7–7.7)
Neutrophils Relative %: 55 %
Platelets: 253 10*3/uL (ref 150–400)
RBC: 3.25 MIL/uL — ABNORMAL LOW (ref 3.87–5.11)
RDW: 13.1 % (ref 11.5–15.5)
WBC: 5.3 10*3/uL (ref 4.0–10.5)
nRBC: 0 % (ref 0.0–0.2)

## 2022-04-09 MED ORDER — SODIUM CHLORIDE 0.9% FLUSH
10.0000 mL | Freq: Once | INTRAVENOUS | Status: AC
  Administered 2022-04-09: 10 mL via INTRAVENOUS
  Filled 2022-04-09: qty 10

## 2022-04-09 MED ORDER — HEPARIN SOD (PORK) LOCK FLUSH 100 UNIT/ML IV SOLN
500.0000 [IU] | Freq: Once | INTRAVENOUS | Status: AC
  Administered 2022-04-09: 500 [IU] via INTRAVENOUS
  Filled 2022-04-09: qty 5

## 2022-04-10 LAB — CANCER ANTIGEN 19-9: CA 19-9: 29 U/mL (ref 0–35)

## 2022-04-15 IMAGING — MR MR ABDOMEN WO/W CM
16 of 17 series · 43 of 48 positions shown · IV contrast (gadavist)
Comparison: CT evaluation from August 04, 2020.

CLINICAL DATA: Abdominal mass, intra-abdominal mass, 19 mm lesion
discovered on recent CT evaluation.

EXAM:
MRI ABDOMEN WITHOUT AND WITH CONTRAST
TECHNIQUE: Multiplanar multisequence MR imaging of the abdomen was performed
both before and after the administration of intravenous contrast.
CONTRAST:  5mL GADAVIST GADOBUTROL 1 MMOL/ML IV SOLN

[Series 2: cor ssfse / · coronal · 7.0mm · 1.37mm/px · 2 of 24 slices shown]
[im 1/24]
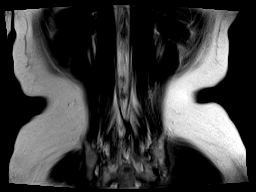
[im 24/24]
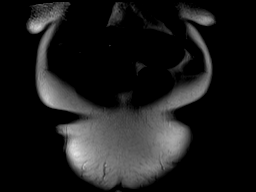

[Series 3: T2 fat-sat · axial · 6.0mm · 1.37mm/px · z∈[-77,+153]mm · 3 of 33 slices shown]
[im 1/33]
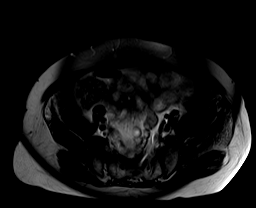
[im 17/33]
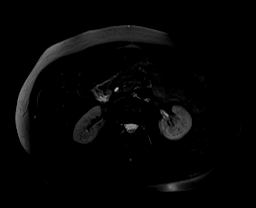
[im 33/33]
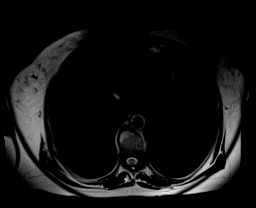

[Series 6: DWI · axial · 6.0mm · 2.00mm/px · z∈[-67,+142]mm · 5 of 90 slices shown (1 of 2)]
[im 1/90]
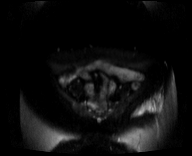
[im 23/90]
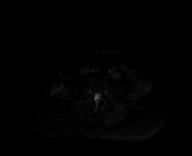
[im 45/90]
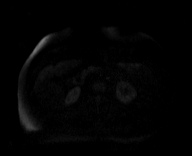
[im 67/90]
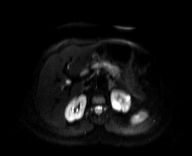
[im 90/90]
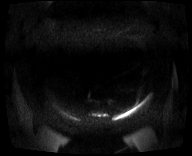

[Series 7: DWI · axial · 6.0mm · 2.00mm/px · 1 of 30 slices shown (2 of 2)]
[im 1/30]
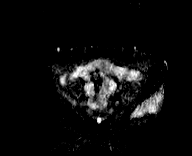

[Series 8: T1 · axial · 6.0mm · 0.68mm/px · z∈[-67,+142]mm · 3 of 60 slices shown]
[im 1/60]
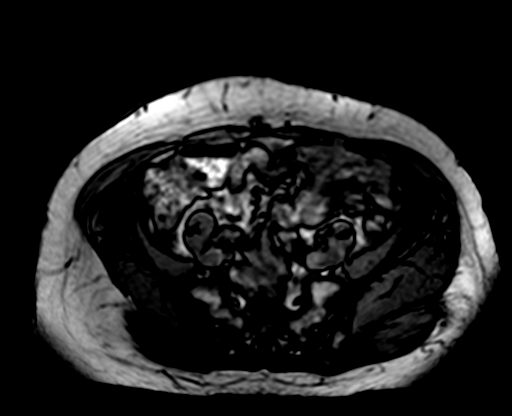
[im 30/60]
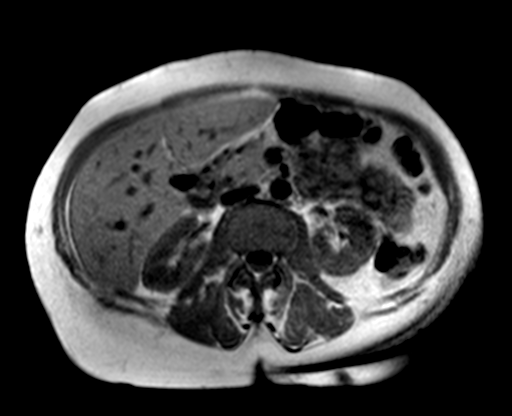
[im 60/60]
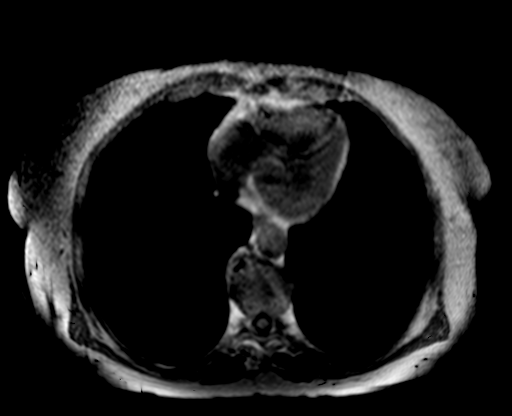

[Series 9: bSSFP · axial · 6.0mm · 0.74mm/px · 1 of 30 slices shown]
[im 1/30]
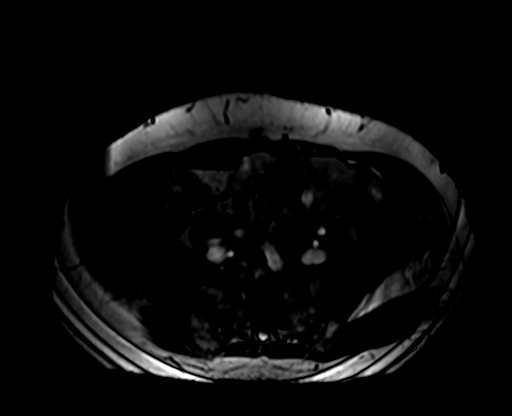

[Series 10: axial ssfse / · axial · 6.0mm · 1.19mm/px · 1 of 30 slices shown]
[im 1/30]
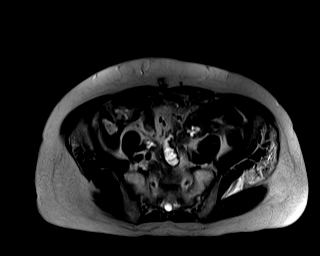

[Series 11: axial dynamic pre · axial · non-contrast · 3.0mm · 1.25mm/px · z∈[-57,+132]mm · 3 of 64 slices shown]
[im 1/64]
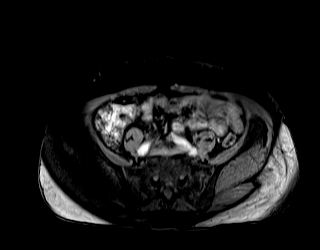
[im 32/64]
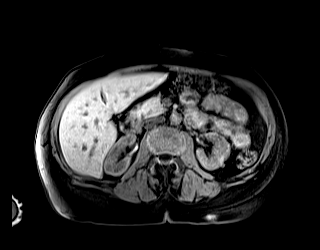
[im 64/64]
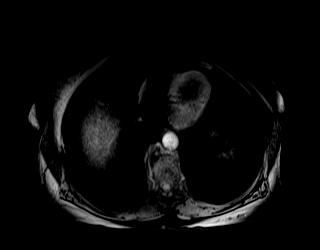

[Series 12: axial dynamic post · axial · 3.0mm · 1.25mm/px · z∈[-57,+132]mm · 3 of 64 slices shown (1 of 6)]
[im 1/64]
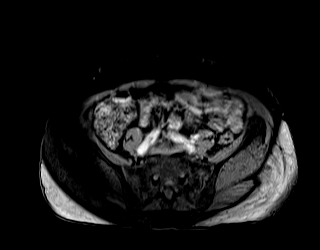
[im 32/64]
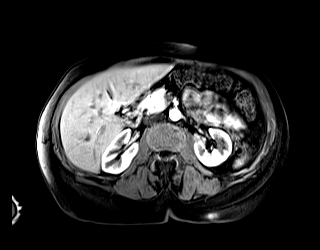
[im 64/64]
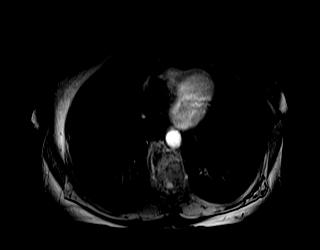

[Series 13: axial dynamic post · axial · 3.0mm · 1.25mm/px · z∈[-57,+132]mm · 3 of 64 slices shown (2 of 6)]
[im 1/64]
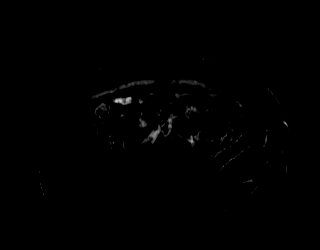
[im 32/64]
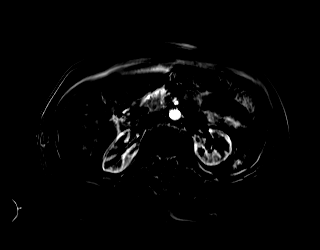
[im 64/64]
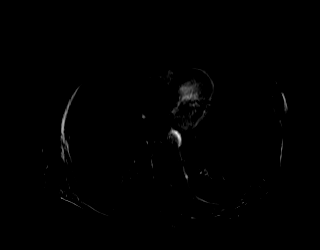

[Series 14: axial dynamic post · axial · 3.0mm · 1.25mm/px · z∈[-57,+132]mm · 3 of 64 slices shown (3 of 6)]
[im 1/64]
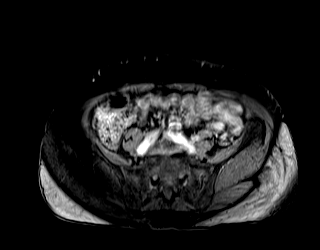
[im 32/64]
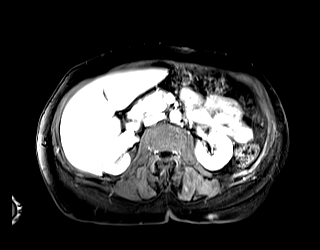
[im 64/64]
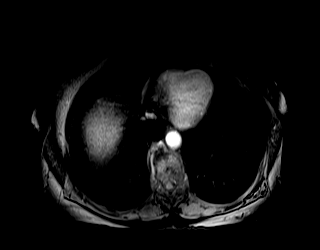

[Series 15: axial dynamic post · axial · 3.0mm · 1.25mm/px · z∈[-57,+132]mm · 3 of 64 slices shown (4 of 6)]
[im 1/64]
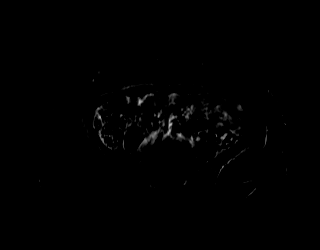
[im 32/64]
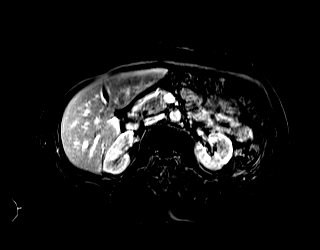
[im 64/64]
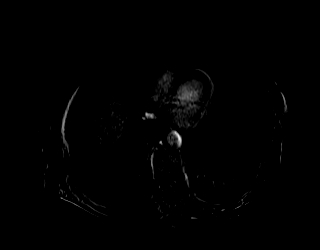

[Series 16: axial dynamic post · axial · 3.0mm · 1.25mm/px · z∈[-57,+132]mm · 3 of 64 slices shown (5 of 6)]
[im 1/64]
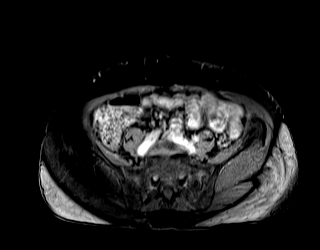
[im 32/64]
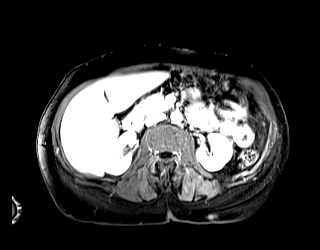
[im 64/64]
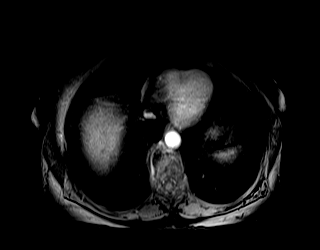

[Series 17: axial dynamic post · axial · 3.0mm · 1.25mm/px · z∈[-57,+132]mm · 3 of 64 slices shown (6 of 6)]
[im 1/64]
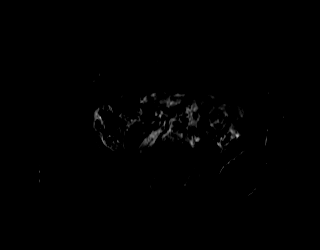
[im 32/64]
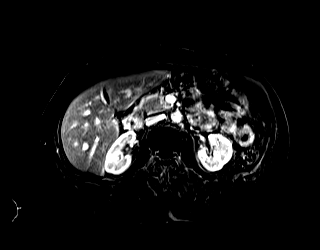
[im 64/64]
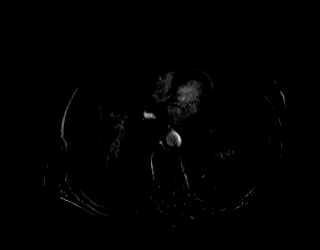

[Series 19: axial dynamic delayed · axial · 3.0mm · 1.25mm/px · z∈[-57,+132]mm · 3 of 64 slices shown]
[im 1/64]
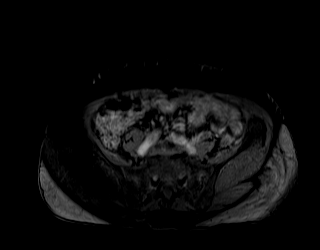
[im 32/64]
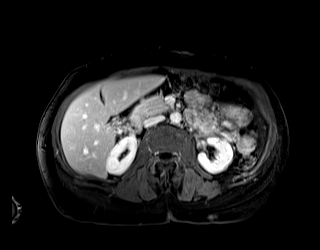
[im 64/64]
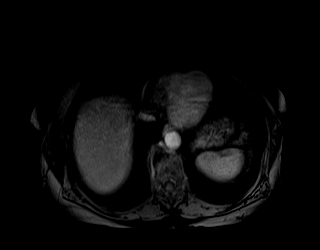

[Series 20: axial dynamic delayed_sub · axial · 3.0mm · 1.25mm/px · z∈[-57,+132]mm · 3 of 64 slices shown]
[im 1/64]
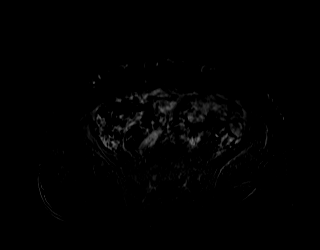
[im 32/64]
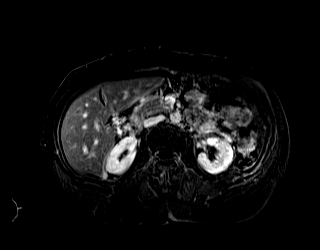
[im 64/64]
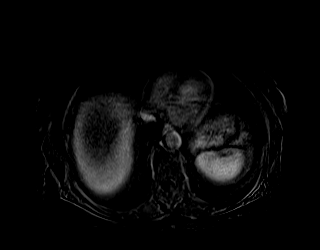

[43 of 48 positions shown; findings below may reference images not displayed]

FINDINGS: Lower chest: Incidental imaging of the lung bases without effusion
or sign of consolidative change.

Hepatobiliary: Minimal signal loss in the area of concern along the
gallbladder fossa, RIGHT hepatic margin. No discrete, visible lesion
on noncontrast or contrasted imaging aside from very subtle signal
loss in this area. No biliary duct dilation.

Pancreas: Loss of normal T1 signal in the distal, peripheral
pancreas beginning at the neck of the pancreas. Pancreatic duct is
mildly dilated at the site of transition and into the peripheral
pancreas. No signs of pancreatic inflammation. Potential lesion in
the pancreatic neck based on this appearance, not visualized as a
discrete lesion on the current study.w ductal transition best
demonstrated on single shot T2 images on images 12 and 13 in the
pancreatic neck of series 10. This also corresponds to transition of
normal an abnormal pancreatic signal.

Postcontrast image 29 of series 12 shows more rounded appearance of
signal at the transition hypointense to both peripheral dorsal
pancreas and ventral pancreas. This becomes more isointense on later
phases to the remainder of the pancreas.

Spleen:  Normal.

Adrenals/Urinary Tract:  Adrenal glands are normal.

Symmetric renal enhancement without hydronephrosis. Renal cysts
largest on the LEFT in the upper pole measuring 3.0 x 2.2 cm.

Stomach/Bowel: No acute gastrointestinal process to the extent
evaluated.

Vascular/Lymphatic: Patent abdominal vasculature. There is no
gastrohepatic or hepatoduodenal ligament lymphadenopathy. No
retroperitoneal or mesenteric lymphadenopathy.

Other:  No ascites.

Musculoskeletal: No suspicious bone lesions identified.
IMPRESSION: 1. Ductal transition, loss of normal T1 signal in the peripheral
pancreas and potential small pancreatic lesion suspicious for early
pancreatic adenocarcinoma in the neck of the pancreas. Endoscopic
assessment is suggested for further evaluation.
2. No signs of nodal disease.
3. Area of concern in the liver represents variable fat deposition
along the gallbladder fossa.
4. No evidence of biliary duct dilation.

These results will be called to the ordering clinician or
representative by the Radiologist Assistant, and communication
documented in the PACS or [REDACTED].

## 2022-04-21 ENCOUNTER — Telehealth: Payer: Self-pay | Admitting: *Deleted

## 2022-04-21 DIAGNOSIS — Z95828 Presence of other vascular implants and grafts: Secondary | ICD-10-CM

## 2022-04-21 NOTE — Telephone Encounter (Signed)
Patient called stating that she wants her port removed

## 2022-04-23 ENCOUNTER — Ambulatory Visit: Payer: BC Managed Care – PPO | Admitting: Psychiatry

## 2022-04-23 ENCOUNTER — Other Ambulatory Visit
Admission: RE | Admit: 2022-04-23 | Discharge: 2022-04-23 | Disposition: A | Discharge status: Home / Self Care | Payer: BC Managed Care – PPO | Source: Ambulatory Visit | Attending: Psychiatry | Admitting: Psychiatry

## 2022-04-23 ENCOUNTER — Encounter: Payer: Self-pay | Admitting: Psychiatry

## 2022-04-23 VITALS — BP 145/74 | HR 69 | Temp 98.5°F | Ht 59.0 in | Wt 120.2 lb

## 2022-04-23 DIAGNOSIS — F603 Borderline personality disorder: Secondary | ICD-10-CM | POA: Insufficient documentation

## 2022-04-23 DIAGNOSIS — F3162 Bipolar disorder, current episode mixed, moderate: Secondary | ICD-10-CM

## 2022-04-23 DIAGNOSIS — F411 Generalized anxiety disorder: Secondary | ICD-10-CM | POA: Diagnosis present

## 2022-04-23 DIAGNOSIS — F1220 Cannabis dependence, uncomplicated: Secondary | ICD-10-CM

## 2022-04-23 LAB — VALPROIC ACID LEVEL: Valproic Acid Lvl: 44 ug/mL — ABNORMAL LOW (ref 50.0–100.0)

## 2022-04-23 MED ORDER — QUETIAPINE FUMARATE 25 MG PO TABS: 25.0000 mg | ORAL_TABLET | Freq: Every day | ORAL | 1 refills | Status: DC

## 2022-04-23 NOTE — Progress Notes (Signed)
[facility] MD OP Progress Note  04/23/2022 12:16 PM Sheryl Perez  MRN:  [id number]  Chief Complaint:  Chief Complaint  Patient presents with   Manic Behavior   Anxiety   Medication Refill   Follow-up   HPI: Sheryl Perez is a 62 year old Caucasian female who has a history of bipolar disorder, borderline personality disorder, GAD, cannabis use disorder, history of pancreatic adenocarcinoma status post definitive Whipple surgery currently in remission from her cancer, married, lives in [place] was evaluated in the office today.  Patient today presented extremely late for her appointment.  Patient today appeared to be manic with pressured speech, easy distractibility, and appeared to be nervous.  Patient reports she is currently having a lot of energy, she has been not sleeping since she has felt more energetic in the past few days.  Patient also reports current situational stressors including continued relationship struggles with her spouse.  She reports that has affected her a lot which likely triggered this episode.  She also reports other stressors including relationship struggles between her son and daughter-in-law, daughter-in-law moving out.  Patient reports she also has been smoking cannabis, feels that is the only thing that is going to help her with her appetite.  She agrees to reach out to her oncologist or primary care provider for further recommendations.  Patient reports work is going well.  She reports she is compliant on the Depakote however has been noncompliant with labs as ordered.  Agrees to get it done today.  Patient denies any suicidality, homicidality or perceptual disturbances.  Patient with 3 word memory immediate 3 out of 3, after 5 minutes 2 out of 3.  Patient was unable to do serial threes.  Patient denies any other concerns today.  Visit Diagnosis:    ICD-10-CM   1. Bipolar 1 disorder, mixed, moderate (HCC)  F31.62 QUEtiapine (SEROQUEL) 25 MG tablet     Valproic acid level    Urine drugs of abuse scrn w alc, routine (Ref Lab)    2. GAD (generalized anxiety disorder)  F41.1 Valproic acid level    Urine drugs of abuse scrn w alc, routine (Ref Lab)    3. Borderline personality disorder ([place])  F60.3 Valproic acid level    Urine drugs of abuse scrn w alc, routine (Ref Lab)    4. Cannabis use disorder, moderate, dependence (HCC)  F12.20 Valproic acid level    Urine drugs of abuse scrn w alc, routine (Ref Lab)      Past Psychiatric History: I have reviewed past psychiatric history from progress note on 04/12/2017.  Past trials of risperidone, Abilify, Lamictal, Prozac, trazodone, lithium.  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    Bipolar disorder (HCC)    Chest pain, unspecified    Chronic low back pain 10/23/2013   Chronic UTI    COPD (chronic obstructive pulmonary disease) (HCC)    Depression    Dysfunction of eustachian tube    Enlargement of lymph nodes    Family history of brain cancer    Family history of breast cancer    Family history of prostate cancer    History of Bell's palsy Right   Injury, other and unspecified, knee, leg, ankle, and foot    MRSA (methicillin resistant staph aureus) culture positive 07/11/2014   abscess   Other specified disease of hair and hair follicles  Pancreatic cancer [facility])    Pancreatic cancer ([place])    Rectal prolapse    Unspecified symptom associated with female genital organs     Past Surgical History:  Procedure Laterality Date   ABDOMINAL HYSTERECTOMY     bladder tack  2010   CHOLECYSTECTOMY  2002   FOOT SURGERY Bilateral 2004   bunion   GANGLION CYST EXCISION  2002   INCISION AND DRAINAGE ABSCESS  04-16-15   PARTIAL HYSTERECTOMY  2010   uterus removed   PORTA CATH INSERTION N/A 09/11/2020   Procedure: PORTA CATH INSERTION;  Surgeon: Algernon Huxley, MD;  Location: [facility];   Service: Cardiovascular;  Laterality: N/A;    Family Psychiatric History: Reviewed family psychiatric history from progress note on 04/12/2017.  Family History:  Family History  Problem Relation Age of Onset   Diabetes Father    Cancer Father        brain tumor   Depression Father    Depression Sister    Alcohol abuse Brother    Depression Brother    Leukemia Maternal Aunt    Prostate cancer Maternal Uncle    Breast cancer Paternal Aunt        dx >50   Heart disease Maternal Grandmother    Heart attack Maternal Grandmother    Stroke Maternal Grandmother    Parkinson's disease Maternal Grandfather    Leukemia Paternal Grandmother    Diabetes Paternal Grandfather    Breast cancer Cousin    Brain cancer Niece        dx under 20    Social History: Reviewed social history from progress note on 04/12/2017. Social History   Socioeconomic History   Marital status: Married    Spouse name: Not on file   Number of children: 3   Years of education: hs   Highest education level: Some college, no degree  Occupational History   Not on file  Tobacco Use   Smoking status: Former    Packs/day: 0.50    Years: 40.00    Additional pack years: 0.00    Total pack years: 20.00    Types: Cigarettes    Quit date: 07/14/2020    Years since quitting: 1.7   Smokeless tobacco: Never  Vaping Use   Vaping Use: Never used  Substance and Sexual Activity   Alcohol use: No    Alcohol/week: 1.0 standard drink of alcohol    Types: 1 Glasses of wine per week   Drug use: Yes    Frequency: 7.0 times per week    Types: Marijuana   Sexual activity: Yes    Birth control/protection: None  Other Topics Concern   Not on file  Social History Narrative   Patient lives at home with her husband Sheryl Perez).   Patient works full time.   Education CNA    Right handed.   Caffeine Tea , Soda.   Social Determinants of Health   Financial Resource Strain: High Risk (01/10/2017)   Overall Financial Resource  Strain (CARDIA)    Difficulty of Paying Living Expenses: Hard  Food Insecurity: No Food Insecurity (01/10/2017)   Hunger Vital Sign    Worried About Running Out of Food in the Last Year: Never true    Ran Out of Food in the Last Year: Never true  Transportation Needs: No Transportation Needs (01/10/2017)   PRAPARE - Hydrologist (Medical): No    Lack of Transportation (Non-Medical): No  Physical  Activity: Inactive (01/10/2017)   Exercise Vital Sign    Days of Exercise per Week: 0 days    Minutes of Exercise per Session: 0 min  Stress: Stress Concern Present (01/10/2017)   [place]    Feeling of Stress : Very much  Social Connections: Somewhat Isolated (01/10/2017)   Social Connection and Isolation Panel [NHANES]    Frequency of Communication with Friends and Family: More than three times a week    Frequency of Social Gatherings with Friends and Family: More than three times a week    Attends Religious Services: More than 4 times per year    Active Member of [employer] or Organizations: No    Attends Archivist Meetings: Never    Marital Status: Separated    Allergies:  Allergies  Allergen Reactions   Sulfacetamide Sodium Swelling    Diarrhea,dizziness, tongue swelling   Other Other (See Comments) and Swelling    PT states that anything with the sides effects "may cause rash" she cannot take due to her psoriasis, it will make the patches on her hand so thick she can't use her hands.  Diarrhea,dizziness, tongue swelling   Oxycontin [Oxycodone Hcl] Nausea And Vomiting   Sulfa Antibiotics Swelling    Diarrhea,dizziness, tongue swelling   Abilify [Aripiprazole] Other (See Comments)   Cephalosporins Rash   Erythromycin Other (See Comments)    Abd. pain Abd. pain Other reaction(s): Other (See Comments) Abd. pain   Lamotrigine Rash and Other (See Comments)    psorasis worsen psorasis  worsen   Risperidone Other (See Comments)    tremors tremors tremors    Metabolic Disorder Labs: Lab Results  Component Value Date   HGBA1C 5.3 08/24/2018   MPG 105.41 08/24/2018   MPG 105.41 04/14/2017   Lab Results  Component Value Date   PROLACTIN 9.3 08/24/2018   PROLACTIN 12.5 04/14/2017   Lab Results  Component Value Date   CHOL 143 12/24/2021   TRIG 67 12/24/2021   HDL 37 (L) 12/24/2021   CHOLHDL 3.9 12/24/2021   VLDL 37 08/24/2018   LDLCALC 91 12/24/2021   LDLCALC 123 (H) 12/12/2019   Lab Results  Component Value Date   TSH 1.241 06/16/2021   TSH 1.27 12/12/2019    Therapeutic Level Labs: Lab Results  Component Value Date   LITHIUM 0.34 (L) 04/14/2017   LITHIUM 0.3 (L) 06/15/2016   Lab Results  Component Value Date   VALPROATE 44 (L) 04/23/2022   VALPROATE 25 (L) 11/19/2020   No results found for: "CBMZ"  Current Medications: Current Outpatient Medications  Medication Sig Dispense Refill   albuterol (VENTOLIN HFA) 108 (90 Base) MCG/ACT inhaler TAKE 2 PUFFS BY MOUTH EVERY 6 HOURS AS NEEDED FOR WHEEZE OR SHORTNESS OF BREATH 8 each 4   Ascorbic Acid (VITAMIN C) 1000 MG tablet Take 1,000 mg by mouth daily.     calcium-vitamin D (OSCAL WITH D) 500-5 MG-MCG tablet Take 1 tablet by mouth daily.     divalproex (DEPAKOTE) 250 MG DR tablet Take 1 tablet (250 mg total) by mouth daily. 90 tablet 1   fluticasone (FLONASE) 50 MCG/ACT nasal spray Place 2 sprays into both nostrils as needed for allergies or rhinitis.     loperamide (IMODIUM A-D) 2 MG tablet Take 2 at onset of diarrhea, then 1 every 2hrs until 12hr without a BM. May take 2 tab every 4hrs at bedtime. If diarrhea recurs repeat. 100 tablet 1  loratadine (CLARITIN) 10 MG tablet TAKE 1 TABLET BY MOUTH EVERY DAY 90 tablet 1   potassium chloride SA (KLOR-CON M20) 20 MEQ tablet TAKE 1 TABLET BY MOUTH TWICE A DAY 180 tablet 0   propranolol (INDERAL) 20 MG tablet Take 1 tablet (20 mg total) by mouth 3 (three)  times daily. 270 tablet 1   QUEtiapine (SEROQUEL) 25 MG tablet Take 1 tablet (25 mg total) by mouth at bedtime. 30 tablet 1   famotidine (PEPCID) 20 MG tablet Take 1 tablet by mouth 2 (two) times daily.     Multiple Vitamin (MULTIVITAMIN) tablet Take 1 tablet by mouth daily. (Patient not taking: Reported on 04/23/2022)     No current facility-administered medications for this visit.   Facility-Administered Medications Ordered in Other Visits  Medication Dose Route Frequency Provider Last Rate Last Admin   heparin lock flush 100 UNIT/ML injection            heparin lock flush 100 UNIT/ML injection            heparin lock flush 100 unit/mL  500 Units Intravenous Once Sindy Guadeloupe, MD       heparin lock flush 100 unit/mL  500 Units Intravenous Once Sindy Guadeloupe, MD       sodium chloride flush (NS) 0.9 % injection 10 mL  10 mL Intracatheter PRN Sindy Guadeloupe, MD   10 mL at 11/03/20 1441   sodium chloride flush (NS) 0.9 % injection 10 mL  10 mL Intravenous PRN Sindy Guadeloupe, MD         Musculoskeletal: Strength & Muscle Tone: within normal limits Gait & Station: normal Patient leans: N/A  Psychiatric Specialty Exam: Review of Systems  Unable to perform ROS: Psychiatric disorder    Blood pressure (!) 145/74, pulse 69, temperature 98.5 F (36.9 C), temperature source Skin, height 4\' 11"  (1.499 m), weight 120 lb 3.2 oz (54.5 kg), last menstrual period 02/09/2008.Body mass index is 24.28 kg/m.  General Appearance: Casual  Eye Contact:  Fair  Speech:  Pressured  Volume:  Normal  Mood:  Anxious, Euphoric, and Irritable  Affect:  Labile  Thought Process:  Linear and Descriptions of Associations: Circumstantial  Orientation:  Full (Time, Place, and Person)  Thought Content: Logical   Suicidal Thoughts:  No  Homicidal Thoughts:  No  Memory:  Immediate;   Fair Recent;   Fair Remote;   Fair  Judgement:  Fair  Insight:  Shallow  Psychomotor Activity:  Restlessness  Concentration:   Concentration: Poor and Attention Span: Poor  Recall:  [employer] of Knowledge: Fair  Language: Fair  Akathisia:  No  Handed:  Right  AIMS (if indicated): done  Assets:  Communication Skills Desire for Improvement Housing Social Support Transportation  ADL's:  Intact  Cognition: WNL  Sleep:  Poor   Screenings: [place] Office Visit from 07/17/2021 in [place] Video Visit from 04/21/2020 in [place] Office Visit from 05/03/2016 in [place] Admission (Discharged) from 01/15/2015 in [place] Total Score 0 0 0 0      AUDIT    Flowsheet Row Admission (Discharged) from 01/15/2015 in [place]  Alcohol Use Disorder Identification Test Final Score (AUDIT) 1      GAD-7    Flowsheet Row Office Visit from 01/26/2022 in [place] Office Visit  from 11/04/2021 in [place] Video Visit from 09/08/2020 in [place] Office Visit from 08/19/2020 in [place] Office Visit from 04/18/2015 in [facility]  Total GAD-7 Score 0 0 6 2 14       PHQ2-9    [place] Office Visit from 04/23/2022 in [place] Office Visit from 01/26/2022 in [place] Office Visit from 11/04/2021 in [place] Office Visit from 07/17/2021 in [place] Office Visit from 07/16/2021 in [facility]  PHQ-2 Total Score 0 0 0 1 0  PHQ-9 Total Score 2 0 -- 4 0      [place] Office Visit from 04/23/2022 in [place] Office Visit from 01/26/2022 in [place] Office Visit from 11/04/2021 in [place] Low Risk No Risk No Risk        Assessment and Plan: Sheryl Perez is a 62 year old Caucasian female who has a history of bipolar disorder, cannabis use disorder, tobacco use disorder, pancreatic adenocarcinoma currently in remission was evaluated in office today.  Patient is currently struggling with manic symptoms, sleep problems, relationship struggles, will benefit from medication management as well as psychotherapy sessions.  Plan as noted below.  Plan  Bipolar disorder type I mixed moderate-unstable Continue Depakote 250 mg once daily.  Depakote level-11/19/2020-25-subtherapeutic. Patient was advised to get Depakote level last visit however has been noncompliant.  Will consider increasing the dosage of Depakote once she has her levels done. Patient encouraged to get her labs done today. Start Seroquel 25 mg p.o. nightly   GAD-unstable Started Seroquel as noted above. Will refer patient for CBT, provided information for Ms. Miguel Dibble Propranolol 20 mg p.o. 3 times daily.   Cannabis use disorder- unstable Provided counseling.  High risk medication use-will order Depakote level, will order urine drug screen.  Patient to go to [facility] lab. Reviewed the following labs-CMP-within normal limits 04/09/2022 except for alkaline phosphatase slightly elevated CBC with differential-hemoglobin low at 10.5-chronically low-patient encouraged to follow-up with her provider. Platelet count-253-within normal limits.  Follow-up in clinic in 4 weeks or sooner if needed.    Consent: Patient/Guardian gives verbal consent for treatment and assignment of benefits for services provided during this visit. Patient/Guardian expressed understanding and agreed to proceed.  This note was generated in  part or whole with voice recognition software. Voice recognition is usually quite accurate but there are transcription errors that can and very often do occur. I apologize for any typographical errors that were not detected and corrected.       Ursula Alert, MD 04/23/2022, 12:16 PM

## 2022-04-23 NOTE — Patient Instructions (Signed)
Quetiapine Tablets What is this medication? QUETIAPINE (kwe TYE a peen) treats schizophrenia and bipolar disorder. It works by balancing the levels of dopamine and serotonin in your brain, hormones that help regulate mood, behaviors, and thoughts. It belongs to a group of medications called antipsychotics. Antipsychotic medications can be used to treat several kinds of mental health conditions. This medicine may be used for other purposes; ask your health care provider or pharmacist if you have questions. COMMON BRAND NAME(S): Seroquel What should I tell my care team before I take this medication? They need to know if you have any of these conditions: Blockage in your bowels Cataracts Constipation Dementia Diabetes Difficulty swallowing Glaucoma Heart disease High levels of prolactin History of breast cancer History of irregular heartbeat Liver disease Low blood cell levels (white cells, red cells, and platelets) Low blood pressure Parkinson disease Prostate disease Seizures Suicidal thoughts, plans, or attempt by you or a family member Thyroid disease Trouble passing urine An unusual or allergic reaction to quetiapine, other medications, foods, dyes, or preservatives Pregnant or trying to get pregnant Breastfeeding How should I use this medication? Take this medication by mouth with water. Take it as directed on the prescription label at the same time every day. You can take it with or without food. If it upsets your stomach, take it with food. Keep taking it unless your care team tells you to stop. A special MedGuide will be given to you by the pharmacist with each prescription and refill. Be sure to read this information carefully each time. Talk to your care team about the use of this medication in children. While this medication may be prescribed for children as young as 10 years for selected conditions, precautions do apply. People over 36 years of age may have a stronger  reaction to this medication and need smaller doses. Overdosage: If you think you have taken too much of this medicine contact a poison control center or emergency room at once. NOTE: This medicine is only for you. Do not share this medicine with others. What if I miss a dose? If you miss a dose, take it as soon as you can. If it is almost time for your next dose, take only that dose. Do not take double or extra doses. What may interact with this medication? Do not take this medication with any of the following: Cisapride Dronedarone Metoclopramide Pimozide Thioridazine This medication may also interact with the following: Alcohol Antihistamines for allergy, cough, and cold Atropine Avasimibe Certain antivirals for HIV or hepatitis Certain medications for anxiety or sleep Certain medications for bladder problems, such as oxybutynin, tolterodine Certain medications for depression, such as amitriptyline, fluoxetine, nefazodone, sertraline Certain medications for fungal infections, such as fluconazole, ketoconazole, itraconazole, posaconazole Certain medications for stomach problems, such as dicyclomine, hyoscyamine Certain medications for travel sickness, such as scopolamine Cimetidine General anesthetics, such as halothane, isoflurane, methoxyflurane, propofol Ipratropium Levodopa or other medications for Parkinson disease Medications for blood pressure Medications for seizures Medications that relax muscles for surgery Opioid medications for pain Other medications that cause heart rhythm changes Phenothiazines, such as chlorpromazine, prochlorperazine Rifampin St. John's wort This list may not describe all possible interactions. Give your health care provider a list of all the medicines, herbs, non-prescription drugs, or dietary supplements you use. Also tell them if you smoke, drink alcohol, or use illegal drugs. Some items may interact with your medicine. What should I watch for  while using this medication? Visit your care team for regular  checks on your progress. Tell your care team if your symptoms do not start to get better or if they get worse. Do not suddenly stop taking This medication. You may develop a severe reaction. Your care team will tell you how much medication to take. If your care team wants you to stop the medication, the dose may be slowly lowered over time to avoid any side effects. You may need to have an eye exam before and during use of this medication. This medication may increase blood sugar. Ask your care team if changes in diet or medications are needed if you have diabetes. This medication may cause thoughts of suicide or depression. This includes sudden changes in mood, behaviors, or thoughts. These changes can happen at any time but are more common in the beginning of treatment or after a change in dose. Call your care team right away if you experience these thoughts or worsening depression. This medication may affect your coordination, reaction time, or judgment. Do not drive or operate machinery until you know how this medication affects you. Sit up or stand slowly to reduce the risk of dizzy or fainting spells. Drinking alcohol with this medication can increase the risk of these side effects. This medication can cause problems with controlling your body temperature. It can lower the response of your body to cold temperatures. If possible, stay indoors during cold weather. If you must go outdoors, wear warm clothes. It can also lower the response of your body to heat. Do not overheat. Do not over-exercise. Stay out of the sun when possible. If you must be in the sun, wear cool clothing. Drink plenty of water. If you have trouble controlling your body temperature, call your care team right away. What side effects may I notice from receiving this medication? Side effects that you should report to your care team as soon as possible: Allergic  reactions--skin rash, itching, hives, swelling of the face, lips, tongue, or throat Heart rhythm changes--fast or irregular heartbeat, dizziness, feeling faint or lightheaded, chest pain, trouble breathing High blood sugar (hyperglycemia)--increased thirst or amount of urine, unusual weakness or fatigue, blurry vision High fever, stiff muscles, increased sweating, fast or irregular heartbeat, and confusion, which may be signs of neuroleptic malignant syndrome High prolactin level--unexpected breast tissue growth, discharge from the nipple, change in sex drive or performance, irregular menstrual cycle Increase in blood pressure in children Infection--fever, chills, cough, or sore throat Low blood pressure--dizziness, feeling faint or lightheaded, blurry vision Low thyroid levels (hypothyroidism)--unusual weakness or fatigue, increased sensitivity to cold, constipation, hair loss, dry skin, weight gain, feelings of depression Pain or trouble swallowing Seizures Stroke--sudden numbness or weakness of the face, arm, or leg, trouble speaking, confusion, trouble walking, loss of balance or coordination, dizziness, severe headache, change in vision Sudden eye pain or change in vision such as blurry vision, seeing halos around lights, vision loss Thoughts of suicide or self-harm, worsening mood, feelings of depression Trouble passing urine Uncontrolled and repetitive body movements, muscle stiffness or spasms, tremors or shaking, loss of balance or coordination, restlessness, shuffling walk, which may be signs of extrapyramidal symptoms (EPS) Side effects that usually do not require medical attention (report to your care team if they continue or are bothersome): Constipation Dizziness Drowsiness Dry mouth Weight gain This list may not describe all possible side effects. Call your doctor for medical advice about side effects. You may report side effects to FDA at 1-800-FDA-1088. Where should I keep my  medication? Keep out  of the reach of children. Store at room temperature between 15 and 30 degrees C (59 and 86 degrees F). Throw away any unused medication after the expiration date. NOTE: This sheet is a summary. It may not cover all possible information. If you have questions about this medicine, talk to your doctor, pharmacist, or health care provider.  2023 Elsevier/Gold Standard (2021-08-10 00:00:00)

## 2022-04-26 ENCOUNTER — Other Ambulatory Visit: Payer: Self-pay

## 2022-04-26 ENCOUNTER — Telehealth (INDEPENDENT_AMBULATORY_CARE_PROVIDER_SITE_OTHER): Payer: Self-pay

## 2022-04-26 ENCOUNTER — Encounter
Admission: RE | Disposition: A | Discharge status: Home / Self Care | Payer: Self-pay | Source: Home / Self Care | Attending: Vascular Surgery

## 2022-04-26 ENCOUNTER — Encounter: Payer: Self-pay | Admitting: Vascular Surgery

## 2022-04-26 ENCOUNTER — Ambulatory Visit
Admission: RE | Admit: 2022-04-26 | Discharge: 2022-04-26 | Disposition: A | Discharge status: Home / Self Care | Payer: BC Managed Care – PPO | Attending: Vascular Surgery | Admitting: Vascular Surgery

## 2022-04-26 DIAGNOSIS — Z452 Encounter for adjustment and management of vascular access device: Secondary | ICD-10-CM | POA: Insufficient documentation

## 2022-04-26 DIAGNOSIS — C259 Malignant neoplasm of pancreas, unspecified: Secondary | ICD-10-CM | POA: Diagnosis not present

## 2022-04-26 DIAGNOSIS — Z9221 Personal history of antineoplastic chemotherapy: Secondary | ICD-10-CM | POA: Diagnosis not present

## 2022-04-26 HISTORY — PX: PORTA CATH REMOVAL: CATH118286

## 2022-04-26 SURGERY — PORTA CATH REMOVAL
Anesthesia: Moderate Sedation

## 2022-04-26 MED ORDER — MIDAZOLAM HCL 2 MG/2ML IJ SOLN
INTRAMUSCULAR | Status: DC | PRN
  Administered 2022-04-26: 2 mg via INTRAVENOUS

## 2022-04-26 MED ORDER — FENTANYL CITRATE (PF) 100 MCG/2ML IJ SOLN
INTRAMUSCULAR | Status: DC | PRN
  Administered 2022-04-26: 50 ug via INTRAVENOUS

## 2022-04-26 MED ORDER — MIDAZOLAM HCL 2 MG/2ML IJ SOLN
INTRAMUSCULAR | Status: AC
  Filled 2022-04-26: qty 2

## 2022-04-26 MED ORDER — SODIUM CHLORIDE 0.9 % IV SOLN: INTRAVENOUS | Status: DC

## 2022-04-26 MED ORDER — FENTANYL CITRATE PF 50 MCG/ML IJ SOSY
PREFILLED_SYRINGE | INTRAMUSCULAR | Status: AC
  Filled 2022-04-26: qty 1

## 2022-04-26 SURGICAL SUPPLY — 7 items
DERMABOND ADVANCED .7 DNX12 (GAUZE/BANDAGES/DRESSINGS) IMPLANT
PACK ANGIOGRAPHY (CUSTOM PROCEDURE TRAY) IMPLANT
PENCIL ELECTRO HAND CTR (MISCELLANEOUS) IMPLANT
SPONGE XRAY 4X4 16PLY STRL (MISCELLANEOUS) IMPLANT
SUT MNCRL AB 4-0 PS2 18 (SUTURE) IMPLANT
SUT VIC AB 3-0 SH 27 (SUTURE) ×1
SUT VIC AB 3-0 SH 27X BRD (SUTURE) IMPLANT

## 2022-04-26 NOTE — Op Note (Signed)
[facility]       Operative Note  Date: 04/26/2022  Preoperative diagnosis:  1. Pancreas cancer, no longer using port  Postoperative diagnosis:  Same as above  Procedures: #1. Removal of right jugular port a cath   Surgeon: Leotis Pain, MD  Anesthesia: Local with moderate conscious sedation for 12 minutes using 2 mg of Versed and 50 mcg of Fentanyl  Fluoroscopy time: none  Contrast used: 0  Estimated blood loss: Minimal  Indication for the procedure:  The patient is a 62 y.o. female who has completed her treatment for pancreatic cancer and no longer needs their Port-A-Cath. The patient desires to have this removed. Risks and benefits including need for potential replacement with recurrent disease were discussed and patient is agreeable to proceed.  Description of procedure: The patient was brought to the vascular and interventional radiology suite. Moderate conscious sedation was administered during a face to face encounter with the patient throughout the procedure with my supervision of the RN administering medicines and monitoring the patient's vital signs, pulse oximetry, telemetry and mental status throughout from the start of the procedure until the patient was taken to the recovery room.  The right neck chest and shoulder were sterilely prepped and draped, and a sterile surgical field was created. The area was then anesthetized with 1% lidocaine copiously. The previous incision was reopened and electrocautery used to dissected down to the port and the catheter. These were dissected free and the catheter was gently removed from the vein in its entirety. The port was dissected out from the fibrous connective tissue and the Prolene sutures were removed. The port was then removed in its entirety including the catheter. The wound was then closed with a 3-0 Vicryl and a 4-0 Monocryl and Dermabond was placed as a dressing. The  patient was then taken to the recovery room in stable condition having tolerated the procedure well.  Complications: none  Condition: stable   Leotis Pain, MD 04/26/2022 1:55 PM   This note was created with Dragon Medical transcription system. Any errors in dictation are purely unintentional.

## 2022-04-26 NOTE — Discharge Instructions (Signed)
Implanted Port Removal, Care After Refer to this sheet in the next few weeks. These instructions provide you with information about caring for yourself after your procedure. Your health care provider may also give you more specific instructions. Your treatment has been planned according to current medical practices, but problems sometimes occur. Call your health care provider if you have any problems or questions after your procedure. What can I expect after the procedure? After the procedure, it is common to have: Soreness or pain near your incision. Some swelling or bruising near your incision.  Follow these instructions at home: Medicines Take over-the-counter and prescription medicines only as told by your health care provider. If you were prescribed an antibiotic medicine, take it as told by your health care provider. Do not stop taking the antibiotic even if you start to feel better. Bathing You may shower tomorrow.  Incision care You have an incision with sutures that are dissolvable and skin glue over top incision.  This skin glue will wear off over time.  Do not peel or try to remove this yourself. Keep your dressing dry. Check your incision area every day for signs of infection and if present call your doctor and let them know. Check for: More redness, swelling, or pain. More fluid or blood. Warmth. Pus or a bad smell. Driving If you received a sedative, do not drive for 24 hours after the procedure. If you did not receive a sedative, ask your health care provider when it is safe to drive. Activity Return to your normal activities as told by your health care provider. Ask your health care provider what activities are safe for you. Until your health care provider says it is safe: Do not lift anything that is heavier than 10 lb (4.5 kg). Do not do activities that involve lifting your arms over your head. General instructions Do not use any tobacco products, such as cigarettes,  chewing tobacco, and e-cigarettes. Tobacco can delay healing. If you need help quitting, ask your health care provider. Keep all follow-up visits as told by your health care provider. This is important. Contact a health care provider if: You have more redness, swelling, or pain around your incision. You have more fluid or blood coming from your incision. Your incision feels warm to the touch. You have pus or a bad smell coming from your incision. You have a fever. You have pain that is not relieved by your pain medicine. Get help right away if: You have chest pain. You have difficulty breathing. This information is not intended to replace advice given to you by your health care provider. Make sure you discuss any questions you have with your health care provider. Document Released: 01/06/2015 Document Revised: 07/03/2015 Document Reviewed: 10/30/2014 Elsevier Interactive Patient Education  2018 [employer]  

## 2022-04-26 NOTE — H&P (Signed)
[facility] Admission History & Physical  MRN : [id number]  Sheryl Perez is a 62 y.o. (Feb 12, 1960) female who presents with chief complaint of No chief complaint on file. Marland Kitchen  History of Present Illness: Patient is sent over from the cancer center today for removal of her Port-A-Cath.  She has completed her chemotherapy and no longer needs the port.  She desires to have this removed.  No fevers or chills.  No current facility-administered medications for this encounter.   Facility-Administered Medications Ordered in Other Encounters  Medication Dose Route Frequency Provider Last Rate Last Admin   heparin lock flush 100 UNIT/ML injection            heparin lock flush 100 UNIT/ML injection            heparin lock flush 100 unit/mL  500 Units Intravenous Once Sindy Guadeloupe, MD       heparin lock flush 100 unit/mL  500 Units Intravenous Once Sindy Guadeloupe, MD       sodium chloride flush (NS) 0.9 % injection 10 mL  10 mL Intracatheter PRN Sindy Guadeloupe, MD   10 mL at 11/03/20 1441   sodium chloride flush (NS) 0.9 % injection 10 mL  10 mL Intravenous PRN Sindy Guadeloupe, MD        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    Bipolar disorder ([place])    Chest pain, unspecified    Chronic low back pain 10/23/2013   Chronic UTI    COPD (chronic obstructive pulmonary disease) (HCC)    Depression    Dysfunction of eustachian tube    Enlargement of lymph nodes    Family history of brain cancer    Family history of breast cancer    Family history of prostate cancer    History of Bell's palsy Right   Injury, other and unspecified, knee, leg, ankle, and foot    MRSA (methicillin resistant staph aureus) culture positive 07/11/2014   abscess   Other specified disease of hair and hair follicles    Pancreatic cancer ([place])    Pancreatic cancer ([place])    Rectal prolapse    Unspecified symptom associated  with female genital organs     Past Surgical History:  Procedure Laterality Date   ABDOMINAL HYSTERECTOMY     bladder tack  2010   CHOLECYSTECTOMY  2002   FOOT SURGERY Bilateral 2004   bunion   GANGLION CYST EXCISION  2002   INCISION AND DRAINAGE ABSCESS  04-16-15   PARTIAL HYSTERECTOMY  2010   uterus removed   PORTA CATH INSERTION N/A 09/11/2020   Procedure: PORTA CATH INSERTION;  Surgeon: Algernon Huxley, MD;  Location: [facility];  Service: Cardiovascular;  Laterality: N/A;     Social History   Tobacco Use   Smoking status: Former    Packs/day: 0.50    Years: 40.00    Additional pack years: 0.00    Total pack years: 20.00    Types: Cigarettes    Quit date: 07/14/2020    Years since quitting: 1.7   Smokeless tobacco: Never  Vaping Use   Vaping Use: Never used  Substance Use Topics   Alcohol use: No    Alcohol/week: 1.0 standard drink of alcohol    Types: 1 Glasses of wine per week   Drug use: Yes    Frequency:  7.0 times per week    Types: Marijuana     Family History  Problem Relation Age of Onset   Diabetes Father    Cancer Father        brain tumor   Depression Father    Depression Sister    Alcohol abuse Brother    Depression Brother    Leukemia Maternal Aunt    Prostate cancer Maternal Uncle    Breast cancer Paternal Aunt        dx >50   Heart disease Maternal Grandmother    Heart attack Maternal Grandmother    Stroke Maternal Grandmother    Parkinson's disease Maternal Grandfather    Leukemia Paternal Grandmother    Diabetes Paternal Grandfather    Breast cancer Cousin    Brain cancer Niece        dx under 58    Allergies  Allergen Reactions   Sulfacetamide Sodium Swelling    Diarrhea,dizziness, tongue swelling   Other Other (See Comments) and Swelling    PT states that anything with the sides effects "may cause rash" she cannot take due to her psoriasis, it will make the patches on her hand so thick she can't use her hands.   Diarrhea,dizziness, tongue swelling   Oxycontin [Oxycodone Hcl] Nausea And Vomiting   Sulfa Antibiotics Swelling    Diarrhea,dizziness, tongue swelling   Abilify [Aripiprazole] Other (See Comments)   Cephalosporins Rash   Erythromycin Other (See Comments)    Abd. pain Abd. pain Other reaction(s): Other (See Comments) Abd. pain   Lamotrigine Rash and Other (See Comments)    psorasis worsen psorasis worsen   Risperidone Other (See Comments)    tremors tremors tremors     REVIEW OF SYSTEMS (Negative unless checked)  Constitutional: [] Weight loss  [] Fever  [] Chills Cardiac: [] Chest pain   [] Chest pressure   [] Palpitations   [] Shortness of breath when laying flat   [] Shortness of breath at rest   [x] Shortness of breath with exertion. Vascular:  [] Pain in legs with walking   [] Pain in legs at rest   [] Pain in legs when laying flat   [] Claudication   [] Pain in feet when walking  [] Pain in feet at rest  [] Pain in feet when laying flat   [] History of DVT   [] Phlebitis   [] Swelling in legs   [] Varicose veins   [] Non-healing ulcers Pulmonary:   [] Uses home oxygen   [] Productive cough   [] Hemoptysis   [] Wheeze  [x] COPD   [] Asthma Neurologic:  [] Dizziness  [] Blackouts   [] Seizures   [] History of stroke   [] History of TIA  [] Aphasia   [] Temporary blindness   [] Dysphagia   [] Weakness or numbness in arms   [] Weakness or numbness in legs Musculoskeletal:  [] Arthritis   [] Joint swelling   [x] Joint pain   [] Low back pain Hematologic:  [] Easy bruising  [] Easy bleeding   [] Hypercoagulable state   [] Anemic  [] Hepatitis Gastrointestinal:  [] Blood in stool   [] Vomiting blood  [x] Gastroesophageal reflux/heartburn   [] Difficulty swallowing. Genitourinary:  [] Chronic kidney disease   [] Difficult urination  [] Frequent urination  [] Burning with urination   [] Blood in urine Skin:  [] Rashes   [] Ulcers   [] Wounds Psychological:  [x] History of anxiety   [x]  History of major depression.  Physical  Examination  There were no vitals filed for this visit. There is no height or weight on file to calculate BMI. Gen: WD/WN, NAD Head: [place]/AT, No temporalis wasting.  Ear/Nose/Throat: Hearing grossly intact, nares w/o erythema  or drainage, oropharynx w/o Erythema/Exudate,  Eyes: Conjunctiva clear, sclera non-icteric Neck: Trachea midline.  No JVD.  Pulmonary:  Good air movement, respirations not labored, no use of accessory muscles.  Cardiac: RRR, normal S1, S2. Vascular: port in right chest Vessel Right Left  Radial Palpable Palpable           Musculoskeletal: M/S 5/5 throughout.  Extremities without ischemic changes.  No deformity or atrophy.  Neurologic: Sensation grossly intact in extremities.  Symmetrical.  Speech is fluent. Motor exam as listed above. Psychiatric: Judgment intact, Mood & affect appropriate for pt's clinical situation. Dermatologic: No rashes or ulcers noted.  No cellulitis or open wounds.      CBC Lab Results  Component Value Date   WBC 5.3 04/09/2022   HGB 10.5 (L) 04/09/2022   HCT 32.3 (L) 04/09/2022   MCV 99.4 04/09/2022   PLT 253 04/09/2022    BMET    Component Value Date/Time   NA 136 04/09/2022 1028   NA 138 02/22/2020 1006   NA 138 04/18/2013 1026   K 3.9 04/09/2022 1028   K 3.0 (L) 04/18/2013 1026   CL 104 04/09/2022 1028   CL 105 04/18/2013 1026   CO2 24 04/09/2022 1028   CO2 29 04/18/2013 1026   GLUCOSE 103 (H) 04/09/2022 1028   GLUCOSE 100 (H) 04/18/2013 1026   BUN 23 04/09/2022 1028   BUN 10 02/22/2020 1006   BUN 8 04/18/2013 1026   CREATININE 0.80 04/09/2022 1028   CREATININE 0.88 12/12/2019 1156   CALCIUM 9.0 04/09/2022 1028   CALCIUM 9.3 04/18/2013 1026   GFRNONAA >60 04/09/2022 1028   GFRNONAA 72 12/12/2019 1156   GFRAA 59 (L) 02/22/2020 1006   GFRAA 83 12/12/2019 1156   Estimated Creatinine Clearance: 55.6 mL/min (by C-G formula based on SCr of 0.8 mg/dL).  COAG No results found for: "INR",  "PROTIME"  Radiology No results found.   Assessment/Plan 1.  Pancreatic cancer.  Completed therapy and desires to have port removed.  This can be done today.    Leotis Pain, MD  04/26/2022 1:13 PM

## 2022-04-26 NOTE — Telephone Encounter (Signed)
Spoke with the patient and she is scheduled with Dr. Lucky Cowboy for a port removal today at the [facility] with a 1:00 pm arrival time. Pre-procedure instructions were discussed and patient stated she understood. This will be sent to [facility].

## 2022-04-27 ENCOUNTER — Other Ambulatory Visit: Payer: Self-pay | Admitting: Oncology

## 2022-04-27 ENCOUNTER — Encounter: Payer: Self-pay | Admitting: Vascular Surgery

## 2022-04-29 LAB — URINE DRUGS OF ABUSE SCREEN W ALC, ROUTINE (REF LAB)
Amphetamines, Urine: NEGATIVE ng/mL
Barbiturate, Ur: NEGATIVE ng/mL
Benzodiazepine Quant, Ur: NEGATIVE ng/mL
Cocaine (Metab.): NEGATIVE ng/mL
Ethanol U, Quan: NEGATIVE %
Methadone Screen, Urine: NEGATIVE ng/mL
Opiate Quant, Ur: NEGATIVE ng/mL
Phencyclidine, Ur: NEGATIVE ng/mL
Propoxyphene, Urine: NEGATIVE ng/mL

## 2022-04-29 LAB — PANEL 799049
CARBOXY THC GC/MS CONF: 168 ng/mL
Cannabinoid GC/MS, Ur: POSITIVE — AB

## 2022-05-03 ENCOUNTER — Ambulatory Visit: Payer: BC Managed Care – PPO | Admitting: Podiatry

## 2022-05-03 ENCOUNTER — Encounter: Payer: Self-pay | Admitting: Podiatry

## 2022-05-03 ENCOUNTER — Telehealth: Payer: Self-pay | Admitting: Psychiatry

## 2022-05-03 ENCOUNTER — Ambulatory Visit (INDEPENDENT_AMBULATORY_CARE_PROVIDER_SITE_OTHER): Payer: BC Managed Care – PPO

## 2022-05-03 DIAGNOSIS — M7751 Other enthesopathy of right foot: Secondary | ICD-10-CM

## 2022-05-03 DIAGNOSIS — S9001XA Contusion of right ankle, initial encounter: Secondary | ICD-10-CM

## 2022-05-03 DIAGNOSIS — T8484XA Pain due to internal orthopedic prosthetic devices, implants and grafts, initial encounter: Secondary | ICD-10-CM | POA: Diagnosis not present

## 2022-05-03 DIAGNOSIS — M2012 Hallux valgus (acquired), left foot: Secondary | ICD-10-CM | POA: Diagnosis not present

## 2022-05-03 NOTE — Telephone Encounter (Signed)
Contacted patient to discuss labs-04/23/2022-Depakote level-44-subtherapeutic. Urine drug screen positive for cannabis.  Discussed with patient that Depakote could be readjusted, patient is not interested at this time.  Patient reports Seroquel is beneficial for her mood at this time.  Provided cannabis cessation counseling again.  Patient to call back as needed if she is interested in dosage readjustment.

## 2022-05-06 NOTE — Progress Notes (Signed)
  Subjective:  Patient ID: Sheryl Perez, female    DOB: [birth year],  MRN: [id number]  Chief Complaint  Patient presents with   Foot Pain    "There's bone poking out on my left foot.  On the right foot, I dropped a rack on my right ankle." N - bone spur, ankle pain L - 1st left, ankle - rt D - 3 mos.- left, ankle - yesterday O - gradually worse, suddenly C - sharp pain, catches on sock - left, right - sharp pain, throbs, swelling A - shoes T - put a bandaid over that bone,     62 y.o. female presents with the above complaint. History confirmed with patient.  She previously had bunion surgery many years ago, feels like the wire might be coming out.  Started to cause.  Especially on the left foot which is causing an area of redness and rubbing.  The right foot is similar but not as bad.  The right ankle is starting to feel better already  Objective:  Physical Exam: warm, good capillary refill, no trophic changes or ulcerative lesions, normal DP and PT pulses, normal sensory exam, and well-healed bunionectomy incisions, proximal to this there is palpable orthopedic implant under the skin, irritation around this on the left side but no ulceration or signs of infection.  Right ankle is swollen, no pain on distal fibula ATFL or CFL  Radiographs: Multiple views x-ray of both feet: Prior bunionectomy noted with Kirschner wires present, no fracture dislocation or signs of infection Assessment:   1. Pain due to internal orthopedic prosthetic devices, implants and grafts, initial encounter ([place])   2. Contusion of right ankle, initial encounter      Plan:  Patient was evaluated and treated and all questions answered.  Regarding her ankle contusion we reviewed RICE protocol and she will continue this for now let me know if it does not improve.  Regarding her bilateral feet the hardware appears to have shifted and is working its way out.  I recommend removal of the Kirschner wires.  We  discussed the risks and benefits of this procedure including but not limited to pain, swelling, infection, scar, numbness which may be temporary or permanent, chronic pain, stiffness, nerve pain or damage, wound healing problems.  She understands and wishes to proceed.  All questions addressed.  Informed consent signed and reviewed.   Surgical plan:  Procedure: -Removal of hardware bilateral foot  Location: -[facility]  Anesthesia plan: -IV sedation with local  Postoperative pain plan: - Tylenol 1000 mg every 6 hours, ibuprofen 600 mg every 6 hours, gabapentin 300 mg every 8 hours x5 days, oxycodone 5 mg 1-2 tabs every 6 hours only as needed  DVT prophylaxis: -None required  WB Restrictions / DME needs: -WBAT in surgical shoe  No follow-ups on file.

## 2022-05-12 ENCOUNTER — Ambulatory Visit: Payer: BC Managed Care – PPO | Admitting: Podiatry

## 2022-05-17 ENCOUNTER — Ambulatory Visit: Payer: BC Managed Care – PPO | Admitting: Psychiatry

## 2022-05-19 ENCOUNTER — Other Ambulatory Visit: Payer: Self-pay | Admitting: Psychiatry

## 2022-05-19 DIAGNOSIS — F3162 Bipolar disorder, current episode mixed, moderate: Secondary | ICD-10-CM

## 2022-05-20 ENCOUNTER — Other Ambulatory Visit: Payer: Self-pay | Admitting: *Deleted

## 2022-05-20 DIAGNOSIS — Z08 Encounter for follow-up examination after completed treatment for malignant neoplasm: Secondary | ICD-10-CM

## 2022-05-21 ENCOUNTER — Inpatient Hospital Stay: Payer: BC Managed Care – PPO | Attending: Oncology

## 2022-05-21 ENCOUNTER — Inpatient Hospital Stay (HOSPITAL_BASED_OUTPATIENT_CLINIC_OR_DEPARTMENT_OTHER): Payer: BC Managed Care – PPO | Admitting: Oncology

## 2022-05-21 ENCOUNTER — Encounter: Payer: Self-pay | Admitting: Oncology

## 2022-05-21 VITALS — BP 128/67 | HR 77 | Temp 97.5°F | Resp 16 | Ht 59.0 in | Wt 111.4 lb

## 2022-05-21 DIAGNOSIS — Z8507 Personal history of malignant neoplasm of pancreas: Secondary | ICD-10-CM | POA: Diagnosis not present

## 2022-05-21 DIAGNOSIS — F319 Bipolar disorder, unspecified: Secondary | ICD-10-CM | POA: Diagnosis not present

## 2022-05-21 DIAGNOSIS — F1290 Cannabis use, unspecified, uncomplicated: Secondary | ICD-10-CM | POA: Insufficient documentation

## 2022-05-21 DIAGNOSIS — Z823 Family history of stroke: Secondary | ICD-10-CM | POA: Insufficient documentation

## 2022-05-21 DIAGNOSIS — Z87891 Personal history of nicotine dependence: Secondary | ICD-10-CM | POA: Diagnosis not present

## 2022-05-21 DIAGNOSIS — Z803 Family history of malignant neoplasm of breast: Secondary | ICD-10-CM | POA: Diagnosis not present

## 2022-05-21 DIAGNOSIS — C250 Malignant neoplasm of head of pancreas: Secondary | ICD-10-CM | POA: Diagnosis present

## 2022-05-21 DIAGNOSIS — Z87440 Personal history of urinary (tract) infections: Secondary | ICD-10-CM | POA: Insufficient documentation

## 2022-05-21 DIAGNOSIS — Z8042 Family history of malignant neoplasm of prostate: Secondary | ICD-10-CM | POA: Insufficient documentation

## 2022-05-21 DIAGNOSIS — C253 Malignant neoplasm of pancreatic duct: Secondary | ICD-10-CM | POA: Diagnosis not present

## 2022-05-21 DIAGNOSIS — Z882 Allergy status to sulfonamides status: Secondary | ICD-10-CM | POA: Insufficient documentation

## 2022-05-21 DIAGNOSIS — Z9049 Acquired absence of other specified parts of digestive tract: Secondary | ICD-10-CM | POA: Diagnosis not present

## 2022-05-21 DIAGNOSIS — R1012 Left upper quadrant pain: Secondary | ICD-10-CM | POA: Diagnosis not present

## 2022-05-21 DIAGNOSIS — Z08 Encounter for follow-up examination after completed treatment for malignant neoplasm: Secondary | ICD-10-CM | POA: Diagnosis not present

## 2022-05-21 DIAGNOSIS — Z881 Allergy status to other antibiotic agents status: Secondary | ICD-10-CM | POA: Insufficient documentation

## 2022-05-21 DIAGNOSIS — Z5986 Financial insecurity: Secondary | ICD-10-CM | POA: Diagnosis not present

## 2022-05-21 DIAGNOSIS — Z888 Allergy status to other drugs, medicaments and biological substances status: Secondary | ICD-10-CM | POA: Diagnosis not present

## 2022-05-21 DIAGNOSIS — K589 Irritable bowel syndrome without diarrhea: Secondary | ICD-10-CM | POA: Insufficient documentation

## 2022-05-21 DIAGNOSIS — Z832 Family history of diseases of the blood and blood-forming organs and certain disorders involving the immune mechanism: Secondary | ICD-10-CM | POA: Insufficient documentation

## 2022-05-21 DIAGNOSIS — Z885 Allergy status to narcotic agent status: Secondary | ICD-10-CM | POA: Diagnosis not present

## 2022-05-21 DIAGNOSIS — Z833 Family history of diabetes mellitus: Secondary | ICD-10-CM | POA: Diagnosis not present

## 2022-05-21 DIAGNOSIS — Z79899 Other long term (current) drug therapy: Secondary | ICD-10-CM | POA: Insufficient documentation

## 2022-05-21 DIAGNOSIS — Z8249 Family history of ischemic heart disease and other diseases of the circulatory system: Secondary | ICD-10-CM | POA: Insufficient documentation

## 2022-05-21 DIAGNOSIS — Z818 Family history of other mental and behavioral disorders: Secondary | ICD-10-CM | POA: Diagnosis not present

## 2022-05-21 DIAGNOSIS — Z90710 Acquired absence of both cervix and uterus: Secondary | ICD-10-CM | POA: Diagnosis not present

## 2022-05-21 DIAGNOSIS — R1013 Epigastric pain: Secondary | ICD-10-CM | POA: Diagnosis not present

## 2022-05-21 DIAGNOSIS — Z808 Family history of malignant neoplasm of other organs or systems: Secondary | ICD-10-CM | POA: Diagnosis not present

## 2022-05-21 LAB — CBC WITH DIFFERENTIAL/PLATELET
Abs Immature Granulocytes: 0.02 10*3/uL (ref 0.00–0.07)
Basophils Absolute: 0 10*3/uL (ref 0.0–0.1)
Basophils Relative: 0 %
Eosinophils Absolute: 0.4 10*3/uL (ref 0.0–0.5)
Eosinophils Relative: 7 %
HCT: 34.4 % — ABNORMAL LOW (ref 36.0–46.0)
Hemoglobin: 11.1 g/dL — ABNORMAL LOW (ref 12.0–15.0)
Immature Granulocytes: 0 %
Lymphocytes Relative: 24 %
Lymphs Abs: 1.3 10*3/uL (ref 0.7–4.0)
MCH: 32.2 pg (ref 26.0–34.0)
MCHC: 32.3 g/dL (ref 30.0–36.0)
MCV: 99.7 fL (ref 80.0–100.0)
Monocytes Absolute: 0.4 10*3/uL (ref 0.1–1.0)
Monocytes Relative: 7 %
Neutro Abs: 3.4 10*3/uL (ref 1.7–7.7)
Neutrophils Relative %: 62 %
Platelets: 256 10*3/uL (ref 150–400)
RBC: 3.45 MIL/uL — ABNORMAL LOW (ref 3.87–5.11)
RDW: 12 % (ref 11.5–15.5)
WBC: 5.4 10*3/uL (ref 4.0–10.5)
nRBC: 0 % (ref 0.0–0.2)

## 2022-05-21 LAB — COMPREHENSIVE METABOLIC PANEL
ALT: 15 U/L (ref 0–44)
AST: 24 U/L (ref 15–41)
Albumin: 3.9 g/dL (ref 3.5–5.0)
Alkaline Phosphatase: 147 U/L — ABNORMAL HIGH (ref 38–126)
Anion gap: 6 (ref 5–15)
BUN: 12 mg/dL (ref 8–23)
CO2: 24 mmol/L (ref 22–32)
Calcium: 8.9 mg/dL (ref 8.9–10.3)
Chloride: 107 mmol/L (ref 98–111)
Creatinine, Ser: 1.01 mg/dL — ABNORMAL HIGH (ref 0.44–1.00)
GFR, Estimated: >60 mL/min (ref >60)
Glucose, Bld: 113 mg/dL — ABNORMAL HIGH (ref 70–99)
Potassium: 4 mmol/L (ref 3.5–5.1)
Sodium: 137 mmol/L (ref 135–145)
Total Bilirubin: 0.5 mg/dL (ref 0.3–1.2)
Total Protein: 7.5 g/dL (ref 6.5–8.1)

## 2022-05-21 NOTE — Progress Notes (Signed)
Hematology/Oncology Consult note [facility]  Telephone:([phone] [phone]  Patient Care Team: Corky Downs, MD as PCP - General (Internal Medicine) Cherre Huger, DC as Referring Physician (Chiropractic Medicine) Kieth Brightly, MD (General Surgery) Creig Hines, MD as Consulting Physician (Hematology and Oncology)   Name of the patient: Sheryl Perez  [id number]  November 13, 1960   Date of visit: 05/21/22  Diagnosis- pancreatic adenocarcinoma stage II apT2 N1 M0 currently in remission  Chief complaint/ Reason for visit-routine follow-up of pancreatic cancer  Heme/Onc history: patient is a 62 year old female with a history of irritable bowel disease for which she follows up with [facility].  She has undergone EGD and colonoscopy with them in the past.  More recently patient was complaining of left upper quadrant and epigastric abdominal pain which causes sudden episodes of abdominal spasms and was prescribed as needed tramadol for it.  These episodes of pain are unrelated to food and are dull aching or throbbing.  She has undergone cholecystectomy in the past.  She underwent CT abdomen and pelvis with contrast in the ER on 08/04/2020 Which did not reveal any acute pathology which showed a possible hypoechoic 1.9 cm right hepatic lobe lesion.  This was followed by an MRI which showed no discrete lesion in the liver and the area of concern noted on CT scan was more likely to represent variable fat deposition.  However there was loss of normal T1 signal in the peripheral pancreas and a potential small pancreatic lesion suspicious for early pancreatic adenocarcinoma in the neck of the pancreas.     Patient underwent EUS by Dr. Shana Chute which showed an irregular mass in the pancreatic neck measuring 15 x 11 mm.  FNA showed adenocarcinoma.  Pancreatic duct measured 1.1 mm in the head with abrupt dilatation in the region of the mass to 4.1 mm.  No abnormality  noted in the common bile duct and hepatic duct.  Celiac region showed no significant endosonographic abnormality.  No lymphadenopathy was seen.  CA 19-9 mildly elevated at 35   Patient was seen by Dr. Gwenlyn Perking from pancreaticobiliary surgery at [facility] for surgical opinion.  Although patient has a small lesion involving the pancreatic neck, there was a concern for tumor extension towards the splenic vein and narrowing of portal splenic confluence.  Patient received 3 months of neoadjuvant modified FOLFIRINOX chemotherapyAnd then underwent definitive Whipple surgery at [facility] with Dr. Gwenlyn Perking on 01/13/2021.  Final pathology showed 2.3 cm grade 2 moderately differentiated adenocarcinoma with lymphovascular and perineural invasion.  Treatment effect present with residual cancer showing evident tumor regression but more than single cells or rare small group of cancer cells] partial response.  Margins negative.  1 out of 22 lymph nodes positive for malignancy.   Patient completed adjuvant chemotherapy in March 2023.  She remains in remission    Interval history-patient is doing well overallAnd denies any abdominal pain.  She has gained 6 pounds in the last 3 months.  ECOG PS- 1 Pain scale- 0   Review of systems- Review of Systems  Constitutional:  Negative for chills, fever, malaise/fatigue and weight loss.  HENT:  Negative for congestion, ear discharge and nosebleeds.   Eyes:  Negative for blurred vision.  Respiratory:  Negative for cough, hemoptysis, sputum production, shortness of breath and wheezing.   Cardiovascular:  Negative for chest pain, palpitations, orthopnea and claudication.  Gastrointestinal:  Negative for abdominal pain, blood in stool, constipation, diarrhea, heartburn, melena, nausea and vomiting.  Genitourinary:  Negative for dysuria, flank pain, frequency, hematuria and urgency.  Musculoskeletal:  Negative for back pain, joint pain and myalgias.  Skin:  Negative for rash.   Neurological:  Negative for dizziness, tingling, focal weakness, seizures, weakness and headaches.  Endo/Heme/Allergies:  Does not bruise/bleed easily.  Psychiatric/Behavioral:  Negative for depression and suicidal ideas. The patient does not have insomnia.       Allergies  Allergen Reactions   Sulfacetamide Sodium Swelling    Diarrhea,dizziness, tongue swelling   Other Other (See Comments) and Swelling    PT states that anything with the sides effects "may cause rash" she cannot take due to her psoriasis, it will make the patches on her hand so thick she can't use her hands.  Diarrhea,dizziness, tongue swelling   Oxycontin [Oxycodone Hcl] Nausea And Vomiting   Sulfa Antibiotics Swelling    Diarrhea,dizziness, tongue swelling   Abilify [Aripiprazole] Other (See Comments)   Cephalosporins Rash   Erythromycin Other (See Comments)    Abd. pain Abd. pain Other reaction(s): Other (See Comments) Abd. 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    Bipolar disorder    Chest pain, unspecified    Chronic low back pain 10/23/2013   Chronic UTI    COPD (chronic obstructive pulmonary disease)    Depression    Dysfunction of eustachian tube    Enlargement of lymph nodes    Family history of brain cancer    Family history of breast cancer    Family history of prostate cancer    History of Bell's palsy Right   Injury, other and unspecified, knee, leg, ankle, and foot    MRSA (methicillin resistant staph aureus) culture positive 07/11/2014   abscess   Other specified disease of hair and hair follicles    Pancreatic cancer    Pancreatic cancer    Rectal prolapse    Unspecified symptom associated with female genital organs      Past Surgical History:  Procedure  Laterality Date   ABDOMINAL HYSTERECTOMY     bladder tack  2010   CHOLECYSTECTOMY  2002   FOOT SURGERY Bilateral 2004   bunion   GANGLION CYST EXCISION  2002   INCISION AND DRAINAGE ABSCESS  04-16-15   PARTIAL HYSTERECTOMY  2010   uterus removed   PORTA CATH INSERTION N/A 09/11/2020   Procedure: PORTA CATH INSERTION;  Surgeon: Annice Needy, MD;  Location: [facility] INVASIVE CV LAB;  Service: Cardiovascular;  Laterality: N/A;   PORTA CATH REMOVAL N/A 04/26/2022   Procedure: PORTA CATH REMOVAL;  Surgeon: Annice Needy, MD;  Location: [facility] INVASIVE CV LAB;  Service: Cardiovascular;  Laterality: N/A;    Social History   Socioeconomic History   Marital status: Married    Spouse name: Not on file   Number of children: 3   Years of education: hs   Highest education level: Some college, no degree  Occupational History   Not on file  Tobacco Use   Smoking status: Former    Packs/day: 0.50    Years: 40.00    Additional pack years: 0.00    Total pack years: 20.00    Types: Cigarettes    Quit date: 07/14/2020    Years  since quitting: 1.8   Smokeless tobacco: Never  Vaping Use   Vaping Use: Never used  Substance and Sexual Activity   Alcohol use: No    Alcohol/week: 1.0 standard drink of alcohol    Types: 1 Glasses of wine per week   Drug use: Yes    Frequency: 7.0 times per week    Types: Marijuana   Sexual activity: Yes    Birth control/protection: None  Other Topics Concern   Not on file  Social History Narrative   Patient lives at home with her husband Greig Castilla).   Patient works full time.   Education CNA    Right handed.   Caffeine Tea , Soda.   Social Determinants of Health   Financial Resource Strain: High Risk (01/10/2017)   Overall Financial Resource Strain (CARDIA)    Difficulty of Paying Living Expenses: Hard  Food Insecurity: No Food Insecurity (01/10/2017)   Hunger Vital Sign    Worried About Running Out of Food in the Last Year: Never true    Ran Out of Food in the  Last Year: Never true  Transportation Needs: No Transportation Needs (01/10/2017)   PRAPARE - Administrator, Civil Service (Medical): No    Lack of Transportation (Non-Medical): No  Physical Activity: Inactive (01/10/2017)   Exercise Vital Sign    Days of Exercise per Week: 0 days    Minutes of Exercise per Session: 0 min  Stress: Stress Concern Present (01/10/2017)   [employer] of Occupational Health - Occupational Stress Questionnaire    Feeling of Stress : Very much  Social Connections: Somewhat Isolated (01/10/2017)   Social Connection and Isolation Panel [NHANES]    Frequency of Communication with Friends and Family: More than three times a week    Frequency of Social Gatherings with Friends and Family: More than three times a week    Attends Religious Services: More than 4 times per year    Active Member of [employer] or Organizations: No    Attends Banker Meetings: Never    Marital Status: Separated  Intimate Partner Violence: Not At Risk (01/10/2017)   Humiliation, Afraid, Rape, and Kick questionnaire    Fear of Current or Ex-Partner: No    Emotionally Abused: No    Physically Abused: No    Sexually Abused: No    Family History  Problem Relation Age of Onset   Diabetes Father    Cancer Father        brain tumor   Depression Father    Depression Sister    Alcohol abuse Brother    Depression Brother    Leukemia Maternal Aunt    Prostate cancer Maternal Uncle    Breast cancer Paternal Aunt        dx >50   Heart disease Maternal Grandmother    Heart attack Maternal Grandmother    Stroke Maternal Grandmother    Parkinson's disease Maternal Grandfather    Leukemia Paternal Grandmother    Diabetes Paternal Grandfather    Breast cancer Cousin    Brain cancer Niece        dx under 50     Current Outpatient Medications:    albuterol (VENTOLIN HFA) 108 (90 Base) MCG/ACT inhaler, TAKE 2 PUFFS BY MOUTH EVERY 6 HOURS AS NEEDED FOR WHEEZE OR  SHORTNESS OF BREATH, Disp: 8 each, Rfl: 4   Ascorbic Acid (VITAMIN C) 1000 MG tablet, Take 1,000 mg by mouth daily., Disp: , Rfl:  calcium-vitamin D (OSCAL WITH D) 500-5 MG-MCG tablet, Take 1 tablet by mouth daily., Disp: , Rfl:    divalproex (DEPAKOTE) 250 MG DR tablet, Take 1 tablet (250 mg total) by mouth daily., Disp: 90 tablet, Rfl: 1   famotidine (PEPCID) 20 MG tablet, TAKE 1 TABLET BY MOUTH TWICE A DAY, Disp: 180 tablet, Rfl: 3   fluticasone (FLONASE) 50 MCG/ACT nasal spray, Place 2 sprays into both nostrils as needed for allergies or rhinitis., Disp: , Rfl:    loperamide (IMODIUM A-D) 2 MG tablet, Take 2 at onset of diarrhea, then 1 every 2hrs until 12hr without a BM. May take 2 tab every 4hrs at bedtime. If diarrhea recurs repeat., Disp: 100 tablet, Rfl: 1   loratadine (CLARITIN) 10 MG tablet, TAKE 1 TABLET BY MOUTH EVERY DAY, Disp: 90 tablet, Rfl: 1   Multiple Vitamin (MULTIVITAMIN) tablet, Take 1 tablet by mouth daily., Disp: , Rfl:    potassium chloride SA (KLOR-CON M20) 20 MEQ tablet, TAKE 1 TABLET BY MOUTH TWICE A DAY, Disp: 180 tablet, Rfl: 0   propranolol (INDERAL) 20 MG tablet, Take 1 tablet (20 mg total) by mouth 3 (three) times daily., Disp: 270 tablet, Rfl: 1   QUEtiapine (SEROQUEL) 25 MG tablet, TAKE 1 TABLET BY MOUTH EVERYDAY AT BEDTIME, Disp: 90 tablet, Rfl: 1 No current facility-administered medications for this visit.  Facility-Administered Medications Ordered in Other Visits:    heparin lock flush 100 UNIT/ML injection, , , ,    heparin lock flush 100 UNIT/ML injection, , , ,    heparin lock flush 100 unit/mL, 500 Units, Intravenous, Once, Creig Hines, MD   heparin lock flush 100 unit/mL, 500 Units, Intravenous, Once, Creig Hines, MD   sodium chloride flush (NS) 0.9 % injection 10 mL, 10 mL, Intracatheter, PRN, Creig Hines, MD, 10 mL at 11/03/20 1441   sodium chloride flush (NS) 0.9 % injection 10 mL, 10 mL, Intravenous, PRN, Creig Hines, MD  Physical  exam:  Vitals:   05/21/22 0953  BP: 128/67  Pulse: 77  Resp: 16  Temp: (!) 97.5 F (36.4 C)  TempSrc: Tympanic  SpO2: 97%  Weight: 111 lb 6.4 oz (50.5 kg)  Height: 4\' 11"  (1.499 m)   Physical Exam Cardiovascular:     Rate and Rhythm: Normal rate and regular rhythm.     Heart sounds: Normal heart sounds.  Pulmonary:     Effort: Pulmonary effort is normal.     Breath sounds: Normal breath sounds.  Abdominal:     General: Bowel sounds are normal.     Palpations: Abdomen is soft.  Skin:    General: Skin is warm and dry.  Neurological:     Mental Status: She is alert and oriented to person, place, and time.         Latest Ref Rng & Units 05/21/2022    9:32 AM  CMP  Glucose 70 - 99 mg/dL 729   BUN 8 - 23 mg/dL 12   Creatinine 0.21 - 1.00 mg/dL 1.15   Sodium 520 - 802 mmol/L 137   Potassium 3.5 - 5.1 mmol/L 4.0   Chloride 98 - 111 mmol/L 107   CO2 22 - 32 mmol/L 24   Calcium 8.9 - 10.3 mg/dL 8.9   Total Protein 6.5 - 8.1 g/dL 7.5   Total Bilirubin 0.3 - 1.2 mg/dL 0.5   Alkaline Phos 38 - 126 U/L 147   AST 15 - 41 U/L 24   ALT  0 - 44 U/L 15       Latest Ref Rng & Units 05/21/2022    9:32 AM  CBC  WBC 4.0 - 10.5 K/uL 5.4   Hemoglobin 12.0 - 15.0 g/dL 16.1   Hematocrit 09.6 - 46.0 % 34.4   Platelets 150 - 400 K/uL 256     No images are attached to the encounter.  DG Foot Complete Left  Result Date: 05/03/2022 Please see detailed radiograph report in office note.  DG Ankle Complete Right  Result Date: 05/03/2022 Please see detailed radiograph report in office note.  PERIPHERAL VASCULAR CATHETERIZATION  Result Date: 04/26/2022 See surgical note for result.    Assessment and plan- Patient is a 62 y.o. female with stage II pancreatic adenocarcinoma T2 N0 M0 s/p neoadjuvant and adjuvant modified FOLFIRINOX chemotherapy completed in April 2023.  She is here for routine follow-up  Patient had last scans at [facility] in December 2023 which did not show any evidence  of recurrent or metastatic disease.  She has repeat scans scheduled in June of this year.  Clinically patient is doing well with no concerning signs and symptoms of recurrence based on today's exam.  Hemoglobin has remained stable around 11.  Hypokalemia has resolved and she takes 1 tablet of potassium daily.  Weight is improving and overall she feels well.  I will see her back in 6 months with CBC with differential CMP and CA 19-9   Visit Diagnosis 1. Encounter for follow-up surveillance of pancreatic cancer      Dr. Owens Shark, MD, MPH [facility] [phone] 05/21/2022 1:21 PM

## 2022-05-22 LAB — CANCER ANTIGEN 19-9: CA 19-9: 27 U/mL (ref 0–35)

## 2022-05-24 ENCOUNTER — Ambulatory Visit: Payer: BC Managed Care – PPO | Admitting: Podiatry

## 2022-05-24 ENCOUNTER — Ambulatory Visit (INDEPENDENT_AMBULATORY_CARE_PROVIDER_SITE_OTHER): Payer: BC Managed Care – PPO

## 2022-05-24 ENCOUNTER — Telehealth: Payer: Self-pay | Admitting: Urology

## 2022-05-24 ENCOUNTER — Encounter: Payer: Self-pay | Admitting: Podiatry

## 2022-05-24 VITALS — BP 108/48 | HR 68

## 2022-05-24 DIAGNOSIS — T148XXA Other injury of unspecified body region, initial encounter: Secondary | ICD-10-CM

## 2022-05-24 DIAGNOSIS — M79672 Pain in left foot: Secondary | ICD-10-CM

## 2022-05-24 DIAGNOSIS — S9032XA Contusion of left foot, initial encounter: Secondary | ICD-10-CM

## 2022-05-24 NOTE — Telephone Encounter (Signed)
DOS - 06/18/22  REMOVAL HARDWARE BILAT --- 20680  [employer]BS EFFECTIVE DATE 02/09/20   DEDUCTIBLE - $1,500.00 W/ $0.00 REMAINING  OOP - $6,750.00 W/ $4,641.81 REMAINING COINSURANCE - 0%  SPOKE WITH NICOLE C. WITH [employer] AND SHE STATED THAT FOR CPT CODE 49449 NO PRIOR AUTH IS REQUIRED.   CALL REF # I - [id number]

## 2022-05-25 NOTE — Progress Notes (Signed)
  Subjective:  Patient ID: Sheryl Perez, female    DOB: 14-Dec-1960,  MRN: [id number]  Chief Complaint  Patient presents with   Foot Pain    "I was able to put a shoe on it, so it's doing better.  I want him look at the place on the side of my right foot."    62 y.o. female presents with the above complaint. History confirmed with patient.  She dropped a cup on it and was afraid that she broke something or injured the wire that is coming out and wanted x-rayed but is doing better now  Objective:  Physical Exam: warm, good capillary refill, no trophic changes or ulcerative lesions, normal DP and PT pulses, and normal sensory exam.  No bruising or edema, there is area of discoloration from the impact site.  No pain to palpation or instability   Radiographs: Multiple views x-ray of the left foot: no fracture, dislocation, swelling or degenerative changes noted and unchanged alignment of Kirschner wire Assessment:   1. Contusion of left foot, initial encounter      Plan:  Patient was evaluated and treated and all questions answered.  We reviewed her x-rays.  She does not have a fracture.  Surgery is already scheduled to remove the Kirschner wires.  We will continue with this plan she may be WBAT.  RICE protocol reviewed.  OTC pain meds for now  No follow-ups on file.

## 2022-05-26 ENCOUNTER — Ambulatory Visit: Payer: BC Managed Care – PPO | Admitting: Podiatry

## 2022-06-01 ENCOUNTER — Telehealth: Payer: Self-pay | Admitting: *Deleted

## 2022-06-18 ENCOUNTER — Other Ambulatory Visit: Payer: Self-pay | Admitting: Podiatry

## 2022-06-18 DIAGNOSIS — D2372 Other benign neoplasm of skin of left lower limb, including hip: Secondary | ICD-10-CM | POA: Diagnosis not present

## 2022-06-18 DIAGNOSIS — Z4889 Encounter for other specified surgical aftercare: Secondary | ICD-10-CM | POA: Diagnosis not present

## 2022-06-18 MED ORDER — HYDROCODONE-ACETAMINOPHEN 5-325 MG PO TABS: 1.0000 | ORAL_TABLET | Freq: Four times a day (QID) | ORAL | 0 refills | Status: AC | PRN

## 2022-06-18 MED ORDER — IBUPROFEN 600 MG PO TABS: 600.0000 mg | ORAL_TABLET | Freq: Four times a day (QID) | ORAL | 0 refills | Status: AC | PRN

## 2022-06-22 ENCOUNTER — Ambulatory Visit: Payer: BC Managed Care – PPO | Admitting: Psychiatry

## 2022-06-23 ENCOUNTER — Ambulatory Visit (INDEPENDENT_AMBULATORY_CARE_PROVIDER_SITE_OTHER): Payer: BC Managed Care – PPO

## 2022-06-23 ENCOUNTER — Ambulatory Visit (INDEPENDENT_AMBULATORY_CARE_PROVIDER_SITE_OTHER): Payer: BC Managed Care – PPO | Admitting: Podiatry

## 2022-06-23 DIAGNOSIS — T8484XA Pain due to internal orthopedic prosthetic devices, implants and grafts, initial encounter: Secondary | ICD-10-CM

## 2022-06-23 NOTE — Progress Notes (Signed)
  Subjective:  Patient ID: Sheryl Perez, female    DOB: 05-23-1960,  MRN: [id number]    DOS: 06/18/2022 Procedure: Removal of implant bilateral foot  62 y.o. female returns for post-op check.  She is doing well not having much pain  Review of Systems: Negative except as noted in the HPI. Denies N/V/F/Ch.   Objective:  There were no vitals filed for this visit. There is no height or weight on file to calculate BMI. Constitutional Well developed. Well nourished.  Vascular Foot warm and well perfused. Capillary refill normal to all digits.  Calf is soft and supple, no posterior calf or knee pain, negative Homans' sign  Neurologic Normal speech. Oriented to person, place, and time. Epicritic sensation to light touch grossly present bilaterally.  Dermatologic Skin healing well without signs of infection. Skin edges well coapted without signs of infection.  Orthopedic: Tenderness to palpation noted about the surgical site.   Multiple view plain film radiographs: Interval K wire removal Assessment:   1. Pain due to internal orthopedic prosthetic devices, implants and grafts, initial encounter [facility])    Plan:  Patient was evaluated and treated and all questions answered.  S/p foot surgery bilaterally -Progressing as expected post-operatively. -XR: Noted above no complications -WB Status: WBAT in surgical shoes -Sutures: Return in 2 to remove. -Medications: No refills required -Foot redressed.  May remove and shower on Monday.  No follow-ups on file.

## 2022-06-23 NOTE — Telephone Encounter (Signed)
"  I need you to return my call.  I need to give you some information about my short term disability."  Marylene Land, you may have already done this.)

## 2022-06-25 ENCOUNTER — Other Ambulatory Visit: Payer: Self-pay | Admitting: Oncology

## 2022-06-25 NOTE — Telephone Encounter (Signed)
Component Ref Range & Units 1 mo ago (05/21/22) 2 mo ago (04/09/22) 3 mo ago (02/26/22) 5 mo ago (01/25/22) 6 mo ago (12/25/21) 7 mo ago (11/23/21) 7 mo ago (11/02/21)  Potassium 3.5 - 5.1 mmol/L 4.0 3.9 4.0 3.0 Low  4.3 2.9 Low  3.5

## 2022-07-07 ENCOUNTER — Encounter: Payer: Self-pay | Admitting: Podiatry

## 2022-07-07 ENCOUNTER — Ambulatory Visit (INDEPENDENT_AMBULATORY_CARE_PROVIDER_SITE_OTHER): Payer: BC Managed Care – PPO | Admitting: Podiatry

## 2022-07-07 DIAGNOSIS — T8484XA Pain due to internal orthopedic prosthetic devices, implants and grafts, initial encounter: Secondary | ICD-10-CM

## 2022-07-07 NOTE — Progress Notes (Signed)
  Subjective:  Patient ID: Sheryl Perez, female    DOB: September 07, 1960,  MRN: [id number]    DOS: 06/18/2022 Procedure: Removal of implant bilateral foot  62 y.o. female returns for post-op check.  Having some occasional nerve pain but not too bad or think  Review of Systems: Negative except as noted in the HPI. Denies N/V/F/Ch.   Objective:  There were no vitals filed for this visit. There is no height or weight on file to calculate BMI. Constitutional Well developed. Well nourished.  Vascular Foot warm and well perfused. Capillary refill normal to all digits.  Calf is soft and supple, no posterior calf or knee pain, negative Homans' sign  Neurologic Normal speech. Oriented to person, place, and time. Epicritic sensation to light touch grossly present bilaterally.  Dermatologic Incisions well-healed not hypertrophic  Orthopedic: Minimal tenderness, minimal edema   Multiple view plain film radiographs: Interval K wire removal Assessment:   1. Pain due to internal orthopedic prosthetic devices, implants and grafts, initial encounter [facility])    Plan:  Patient was evaluated and treated and all questions answered.  S/p foot surgery bilaterally -Progressing as expected post-operatively. -Sutures removed uneventfully -May return to regular shoe gear and full activity.  May return to work Monday without restrictions.  Return if symptoms worsen or fail to improve.

## 2022-07-18 ENCOUNTER — Other Ambulatory Visit: Payer: Self-pay | Admitting: Psychiatry

## 2022-07-18 DIAGNOSIS — F3178 Bipolar disorder, in full remission, most recent episode mixed: Secondary | ICD-10-CM

## 2022-07-18 DIAGNOSIS — F411 Generalized anxiety disorder: Secondary | ICD-10-CM

## 2022-07-29 ENCOUNTER — Encounter: Payer: BC Managed Care – PPO | Admitting: Podiatry

## 2022-08-11 ENCOUNTER — Ambulatory Visit: Payer: BC Managed Care – PPO | Admitting: Psychiatry

## 2022-09-07 ENCOUNTER — Ambulatory Visit: Payer: BC Managed Care – PPO | Admitting: Psychiatry

## 2022-10-06 ENCOUNTER — Other Ambulatory Visit: Payer: Self-pay | Admitting: Oncology

## 2022-10-06 NOTE — Telephone Encounter (Signed)
Component Ref Range & Units 2 mo ago  Potassium 3.5 - 5.0 mmol/L 4.1

## 2022-10-27 ENCOUNTER — Ambulatory Visit: Payer: BC Managed Care – PPO | Admitting: Psychiatry

## 2022-10-27 ENCOUNTER — Encounter: Payer: Self-pay | Admitting: Psychiatry

## 2022-10-27 VITALS — BP 134/76 | HR 82 | Temp 95.1°F | Ht 59.0 in | Wt 121.0 lb

## 2022-10-27 DIAGNOSIS — F411 Generalized anxiety disorder: Secondary | ICD-10-CM | POA: Diagnosis not present

## 2022-10-27 DIAGNOSIS — F603 Borderline personality disorder: Secondary | ICD-10-CM | POA: Diagnosis not present

## 2022-10-27 DIAGNOSIS — F3178 Bipolar disorder, in full remission, most recent episode mixed: Secondary | ICD-10-CM

## 2022-10-27 DIAGNOSIS — F1220 Cannabis dependence, uncomplicated: Secondary | ICD-10-CM

## 2022-10-27 NOTE — Progress Notes (Signed)
BH MD OP Progress Note  10/27/2022 4:24 PM Sheryl Perez  MRN:  [id number]  Chief Complaint:  Chief Complaint  Patient presents with   Follow-up   Depression   Anxiety   Medication Refill   HPI: Sheryl Perez is a 62 year old Caucasian female who has a history of bipolar disorder, borderline personality disorder, GAD, cannabis use disorder, history of pancreatic adenocarcinoma status post definitive Whipple surgery, currently in remission from her cancer, married, lives in [place] was evaluated in office today.  Patient today appeared to be calm, cooperative and pleasant.  She denies any manic symptoms.  This is a different presentation from her most recent visit on 04/23/2022.  Patient was able to answer questions appropriately today.  She reports overall she is currently doing well on the current medication regimen.  She is compliant on the Depakote as prescribed.  She also takes propranolol.  She denies side effects.  She uses the Seroquel only as needed now.  She is not interested in staying on it on a daily basis and she is worried about the side effects.  She reports she has been able to sleep without the Seroquel.  Patient denies any suicidality, homicidality or perceptual disturbances.  Patient reports she continues to follow up with her oncologist.  She has to go back for repeat CT scan.  She has been cancer free for the past 2 years.  Patient reports she is planning to celebrate her birthday next week.  Her children are coming over.  She looks forward to that.  She reports work is going well.  Patient appeared to be alert, oriented to person place time and situation.  3 word memory immediate 3 out of 3, after 5 minutes 2 out of 3.  Patient was able to spell the word 'WORLD' forward and backward.  Attention and focus seem to be good.  Patient denies any other concerns today.  Visit Diagnosis:    ICD-10-CM   1. Bipolar disorder, in full remission, most recent episode mixed  (HCC)  F31.78     2. GAD (generalized anxiety disorder)  F41.1     3. Borderline personality disorder (HCC)  F60.3     4. Cannabis use disorder, moderate, dependence (HCC)  F12.20       Past Psychiatric History: I have reviewed past psychiatric history from progress note on 04/12/2017.  Past trials of risperidone, Abilify, Lamictal, Prozac, trazodone, lithium.  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    Bipolar disorder (HCC)    Chest pain, unspecified    Chronic low back pain 10/23/2013   Chronic UTI    COPD (chronic obstructive pulmonary disease) (HCC)    Depression    Dysfunction of eustachian tube    Enlargement of lymph nodes    Family history of brain cancer    Family history of breast cancer    Family history of prostate cancer    History of Bell's palsy Right   Injury, other and unspecified, knee, leg, ankle, and foot    MRSA (methicillin resistant staph aureus) culture positive 07/11/2014   abscess   Other specified disease of hair and hair follicles    Pancreatic cancer (HCC)    Pancreatic cancer (HCC)    Rectal prolapse    Unspecified symptom associated with female genital organs     Past Surgical History:  Procedure Laterality Date  ABDOMINAL HYSTERECTOMY     bladder tack  2010   CHOLECYSTECTOMY  2002   FOOT SURGERY Bilateral 2004   bunion   GANGLION CYST EXCISION  2002   INCISION AND DRAINAGE ABSCESS  04-16-15   PARTIAL HYSTERECTOMY  2010   uterus removed   PORTA CATH INSERTION N/A 09/11/2020   Procedure: PORTA CATH INSERTION;  Surgeon: Annice Needy, MD;  Location: [facility] INVASIVE CV LAB;  Service: Cardiovascular;  Laterality: N/A;   PORTA CATH REMOVAL N/A 04/26/2022   Procedure: PORTA CATH REMOVAL;  Surgeon: Annice Needy, MD;  Location: [facility] INVASIVE CV LAB;  Service: Cardiovascular;  Laterality: N/A;    Family Psychiatric History: I have reviewed family psychiatric  history from progress note on 04/12/2017.  Family History:  Family History  Problem Relation Age of Onset   Diabetes Father    Cancer Father        brain tumor   Depression Father    Depression Sister    Alcohol abuse Brother    Depression Brother    Leukemia Maternal Aunt    Prostate cancer Maternal Uncle    Breast cancer Paternal Aunt        dx >50   Heart disease Maternal Grandmother    Heart attack Maternal Grandmother    Stroke Maternal Grandmother    Parkinson's disease Maternal Grandfather    Leukemia Paternal Grandmother    Diabetes Paternal Grandfather    Breast cancer Cousin    Brain cancer Niece        dx under 70    Social History: I have reviewed social history from progress note on 04/12/2017. Social History   Socioeconomic History   Marital status: Married    Spouse name: Not on file   Number of children: 3   Years of education: hs   Highest education level: Some college, no degree  Occupational History   Not on file  Tobacco Use   Smoking status: Former    Current packs/day: 0.00    Average packs/day: 0.5 packs/day for 40.0 years (20.0 ttl pk-yrs)    Types: Cigarettes    Start date: 07/14/1980    Quit date: 07/14/2020    Years since quitting: 2.2   Smokeless tobacco: Never  Vaping Use   Vaping status: Never Used  Substance and Sexual Activity   Alcohol use: No    Alcohol/week: 1.0 standard drink of alcohol    Types: 1 Glasses of wine per week   Drug use: Yes    Frequency: 7.0 times per week    Types: Marijuana   Sexual activity: Yes    Birth control/protection: None  Other Topics Concern   Not on file  Social History Narrative   Patient lives at home with her husband Sheryl Perez).   Patient works full time.   Education CNA    Right handed.   Caffeine Tea , Soda.   Social Determinants of Health   Financial Resource Strain: Low Risk  (01/23/2021)   Received from [facility], [facility]   Overall Financial  Resource Strain (CARDIA)    Difficulty of Paying Living Expenses: Not hard at all  Food Insecurity: No Food Insecurity (01/23/2021)   [facility], [facility]   Hunger Vital Sign    Worried About Running Out of Food in the Last Year: Never true    Ran Out of Food in the Last Year: Never true  Transportation Needs:  No Transportation Needs (01/23/2021)   Received from [facility], [facility]   [facility] - Transportation    In the past 12 months, has lack of transportation kept you from medical appointments or from getting medications?: No    Lack of Transportation (Non-Medical): No  Physical Activity: Inactive (01/10/2017)   Exercise Vital Sign    Days of Exercise per Week: 0 days    Minutes of Exercise per Session: 0 min  Stress: Stress Concern Present (01/10/2017)   [employer] of Occupational Health - Occupational Stress Questionnaire    Feeling of Stress : Very much  Social Connections: Somewhat Isolated (01/10/2017)   Social Connection and Isolation Panel [NHANES]    Frequency of Communication with Friends and Family: More than three times a week    Frequency of Social Gatherings with Friends and Family: More than three times a week    Attends Religious Services: More than 4 times per year    Active Member of [employer] or Organizations: No    Attends Banker Meetings: Never    Marital Status: Separated    Allergies:  Allergies  Allergen Reactions   Sulfacetamide Sodium Swelling    Diarrhea,dizziness, tongue swelling   Other Other (See Comments) and Swelling    PT states that anything with the sides effects "may cause rash" she cannot take due to her psoriasis, it will make the patches on her hand so thick she can't use her hands.  Diarrhea,dizziness, tongue swelling   Oxycontin [Oxycodone Hcl] Nausea And Vomiting   Sulfa Antibiotics Swelling    Diarrhea,dizziness, tongue swelling    Abilify [Aripiprazole] Other (See Comments)   Cephalosporins Rash   Erythromycin Other (See Comments)    Abd. pain Abd. pain Other reaction(s): Other (See Comments) Abd. pain   Lamotrigine Rash and Other (See Comments)    psorasis worsen psorasis worsen   Risperidone Other (See Comments)    tremors tremors tremors    Metabolic Disorder Labs: Lab Results  Component Value Date   HGBA1C 5.3 08/24/2018   MPG 105.41 08/24/2018   MPG 105.41 04/14/2017   Lab Results  Component Value Date   PROLACTIN 9.3 08/24/2018   PROLACTIN 12.5 04/14/2017   Lab Results  Component Value Date   CHOL 143 12/24/2021   TRIG 67 12/24/2021   HDL 37 (L) 12/24/2021   CHOLHDL 3.9 12/24/2021   VLDL 37 08/24/2018   LDLCALC 91 12/24/2021   LDLCALC 123 (H) 12/12/2019   Lab Results  Component Value Date   TSH 1.241 06/16/2021   TSH 1.27 12/12/2019    Therapeutic Level Labs: Lab Results  Component Value Date   LITHIUM 0.34 (L) 04/14/2017   LITHIUM 0.3 (L) 06/15/2016   Lab Results  Component Value Date   VALPROATE 44 (L) 04/23/2022   VALPROATE 25 (L) 11/19/2020   No results found for: "CBMZ"  Current Medications: Current Outpatient Medications  Medication Sig Dispense Refill   albuterol (VENTOLIN HFA) 108 (90 Base) MCG/ACT inhaler TAKE 2 PUFFS BY MOUTH EVERY 6 HOURS AS NEEDED FOR WHEEZE OR SHORTNESS OF BREATH 8 each 4   calcium-vitamin D (OSCAL WITH D) 500-5 MG-MCG tablet Take 1 tablet by mouth daily.     divalproex (DEPAKOTE) 250 MG DR tablet Take 1 tablet (250 mg total) by mouth daily. 90 tablet 1   famotidine (PEPCID) 20 MG tablet TAKE 1 TABLET BY MOUTH TWICE A DAY 180 tablet 3   KLOR-CON M20 20 MEQ tablet TAKE  1 TABLET BY MOUTH EVERY DAY 90 tablet 1   loperamide (IMODIUM A-D) 2 MG tablet Take 2 at onset of diarrhea, then 1 every 2hrs until 12hr without a BM. May take 2 tab every 4hrs at bedtime. If diarrhea recurs repeat. 100 tablet 1   loratadine (CLARITIN) 10 MG tablet TAKE 1  TABLET BY MOUTH EVERY DAY 90 tablet 1   nitrofurantoin, macrocrystal-monohydrate, (MACROBID) 100 MG capsule Take 100 mg by mouth every 12 (twelve) hours.     propranolol (INDERAL) 20 MG tablet TAKE 1 TABLET BY MOUTH THREE TIMES A DAY 270 tablet 1   QUEtiapine (SEROQUEL) 25 MG tablet TAKE 1 TABLET BY MOUTH EVERYDAY AT BEDTIME (Patient taking differently: Takes only as needed) 90 tablet 1   Ascorbic Acid (VITAMIN C) 1000 MG tablet Take 1,000 mg by mouth daily. (Patient not taking: Reported on 10/27/2022)     fluticasone (FLONASE) 50 MCG/ACT nasal spray Place 2 sprays into both nostrils as needed for allergies or rhinitis. (Patient not taking: Reported on 10/27/2022)     No current facility-administered medications for this visit.   Facility-Administered Medications Ordered in Other Visits  Medication Dose Route Frequency Provider Last Rate Last Admin   heparin lock flush 100 UNIT/ML injection            heparin lock flush 100 UNIT/ML injection            heparin lock flush 100 unit/mL  500 Units Intravenous Once Creig Hines, MD       heparin lock flush 100 unit/mL  500 Units Intravenous Once Creig Hines, MD       sodium chloride flush (NS) 0.9 % injection 10 mL  10 mL Intracatheter PRN Creig Hines, MD   10 mL at 11/03/20 1441   sodium chloride flush (NS) 0.9 % injection 10 mL  10 mL Intravenous PRN Creig Hines, MD         Musculoskeletal: Strength & Muscle Tone: within normal limits Gait & Station: normal Patient leans: N/A  Psychiatric Specialty Exam: Review of Systems  Psychiatric/Behavioral: Negative.      Blood pressure 134/76, pulse 82, temperature (!) 95.1 F (35.1 C), temperature source Skin, height 4\' 11"  (1.499 m), weight 121 lb (54.9 kg), last menstrual period 02/09/2008.Body mass index is 24.44 kg/m.  General Appearance: Fairly Groomed  Eye Contact:  Fair  Speech:  Clear and Coherent  Volume:  Normal  Mood:  Euthymic  Affect:  Congruent  Thought Process:  Goal  Directed and Descriptions of Associations: Intact  Orientation:  Full (Time, Place, and Person)  Thought Content: Logical   Suicidal Thoughts:  No  Homicidal Thoughts:  No  Memory:  Immediate;   Fair Recent;   Fair Remote;   Fair  Judgement:  Fair  Insight:  Fair  Psychomotor Activity:  Normal  Concentration:  Concentration: Fair and Attention Span: Fair  Recall:  [employer] of Knowledge: Fair  Language: Fair  Akathisia:  No  Handed:  Right  AIMS (if indicated): done  Assets:  Communication Skills Desire for Improvement Housing Social Support  ADL's:  Intact  Cognition: WNL  Sleep:  Fair   Screenings: Geneticist, molecular Office Visit from 10/27/2022 in [facility] Office Visit from 07/17/2021 in [facility] Video Visit from 04/21/2020 in [facility] Office Visit from 05/03/2016 in [facility] Admission (Discharged)  from 01/15/2015 in [facility]  AIMS Total Score 0 0 0 0 0      AUDIT    Flowsheet Row Admission (Discharged) from 01/15/2015 in [facility]  Alcohol Use Disorder Identification Test Final Score (AUDIT) 1      GAD-7    Flowsheet Row Office Visit from 10/27/2022 in [facility] Office Visit from 01/26/2022 in [facility] Office Visit from 11/04/2021 in [facility] Video Visit from 09/08/2020 in [facility] Office Visit from 08/19/2020 in [facility]  Total GAD-7 Score 0 0 0 6 2      PHQ2-9    Flowsheet Row Office Visit from 10/27/2022 in [facility] Office Visit from 04/23/2022 in [facility] Office Visit from 01/26/2022 in [facility] Office Visit from 11/04/2021 in [facility] Office Visit from 07/17/2021 in [facility]  PHQ-2 Total Score 0 0 0 0 1  PHQ-9 Total Score -- 2 0 -- 4      Flowsheet Row Office Visit from 10/27/2022 in [facility] Office Visit from 04/23/2022 in [facility] Office Visit from 01/26/2022 in [facility]  C-SSRS RISK CATEGORY No Risk Low Risk No Risk        Assessment and Plan: Sheryl Perez is a 62 year old Caucasian female who has a history of bipolar disorder, cannabis use disorder, tobacco use disorder, pancreatic adenocarcinoma currently in remission was evaluated in office today.  Patient is currently stable with regards to her mood, plan as noted below.  Plan Bipolar disorder type I mixed moderate in remission Depakote 250 mg p.o. daily. Depakote level-04/23/2022-44. Continue Seroquel 25 mg p.o. nightly, patient has been using it only as needed.  GAD-stable Patient was referred for CBT to Ms. Felecia Jan Continue Seroquel as needed. Propranolol 20 g p.o. 3 times daily  Cannabis use disorder-improving Patient advised to stay away.  Provided counseling.    Collaboration of Care: Collaboration of Care: Referral or follow-up with counselor/therapist AEB patient encouraged to continue CBT as needed  Patient/Guardian was advised Release of Information must be obtained prior to any record release in order to collaborate their care with an outside provider. Patient/Guardian was advised if they have not already done so to contact the registration department to sign all necessary forms in order for [place] to release information regarding their care.   Consent: Patient/Guardian gives verbal consent for  treatment and assignment of benefits for services provided during this visit. Patient/Guardian expressed understanding and agreed to proceed.   Follow-up in clinic in 6 months or sooner if needed.  This note was generated in part or whole with voice recognition software. Voice recognition is usually quite accurate but there are transcription errors that can and very often do occur. I apologize for any typographical errors that were not detected and corrected.    Jomarie Longs, MD 10/29/2022, 8:07 AM

## 2022-11-19 ENCOUNTER — Encounter: Payer: Self-pay | Admitting: Oncology

## 2022-11-19 ENCOUNTER — Inpatient Hospital Stay (HOSPITAL_BASED_OUTPATIENT_CLINIC_OR_DEPARTMENT_OTHER): Payer: BC Managed Care – PPO | Admitting: Oncology

## 2022-11-19 ENCOUNTER — Inpatient Hospital Stay: Payer: BC Managed Care – PPO | Attending: Oncology

## 2022-11-19 VITALS — BP 127/58 | HR 61 | Temp 95.9°F | Resp 18 | Ht 59.0 in | Wt 125.2 lb

## 2022-11-19 DIAGNOSIS — Z808 Family history of malignant neoplasm of other organs or systems: Secondary | ICD-10-CM | POA: Diagnosis not present

## 2022-11-19 DIAGNOSIS — Z811 Family history of alcohol abuse and dependence: Secondary | ICD-10-CM | POA: Diagnosis not present

## 2022-11-19 DIAGNOSIS — Z888 Allergy status to other drugs, medicaments and biological substances status: Secondary | ICD-10-CM | POA: Diagnosis not present

## 2022-11-19 DIAGNOSIS — Z8249 Family history of ischemic heart disease and other diseases of the circulatory system: Secondary | ICD-10-CM | POA: Diagnosis not present

## 2022-11-19 DIAGNOSIS — Z885 Allergy status to narcotic agent status: Secondary | ICD-10-CM | POA: Diagnosis not present

## 2022-11-19 DIAGNOSIS — C259 Malignant neoplasm of pancreas, unspecified: Secondary | ICD-10-CM

## 2022-11-19 DIAGNOSIS — Z9049 Acquired absence of other specified parts of digestive tract: Secondary | ICD-10-CM | POA: Insufficient documentation

## 2022-11-19 DIAGNOSIS — Z818 Family history of other mental and behavioral disorders: Secondary | ICD-10-CM | POA: Diagnosis not present

## 2022-11-19 DIAGNOSIS — Z8507 Personal history of malignant neoplasm of pancreas: Secondary | ICD-10-CM | POA: Diagnosis not present

## 2022-11-19 DIAGNOSIS — Z08 Encounter for follow-up examination after completed treatment for malignant neoplasm: Secondary | ICD-10-CM | POA: Diagnosis not present

## 2022-11-19 DIAGNOSIS — Z90710 Acquired absence of both cervix and uterus: Secondary | ICD-10-CM | POA: Insufficient documentation

## 2022-11-19 DIAGNOSIS — Z882 Allergy status to sulfonamides status: Secondary | ICD-10-CM | POA: Insufficient documentation

## 2022-11-19 DIAGNOSIS — F1290 Cannabis use, unspecified, uncomplicated: Secondary | ICD-10-CM | POA: Diagnosis not present

## 2022-11-19 DIAGNOSIS — F319 Bipolar disorder, unspecified: Secondary | ICD-10-CM | POA: Insufficient documentation

## 2022-11-19 DIAGNOSIS — R1013 Epigastric pain: Secondary | ICD-10-CM | POA: Insufficient documentation

## 2022-11-19 DIAGNOSIS — Z79899 Other long term (current) drug therapy: Secondary | ICD-10-CM | POA: Diagnosis not present

## 2022-11-19 DIAGNOSIS — Z8042 Family history of malignant neoplasm of prostate: Secondary | ICD-10-CM | POA: Diagnosis not present

## 2022-11-19 DIAGNOSIS — Z806 Family history of leukemia: Secondary | ICD-10-CM | POA: Diagnosis not present

## 2022-11-19 DIAGNOSIS — Z803 Family history of malignant neoplasm of breast: Secondary | ICD-10-CM | POA: Diagnosis not present

## 2022-11-19 DIAGNOSIS — K589 Irritable bowel syndrome without diarrhea: Secondary | ICD-10-CM | POA: Insufficient documentation

## 2022-11-19 DIAGNOSIS — Z833 Family history of diabetes mellitus: Secondary | ICD-10-CM | POA: Diagnosis not present

## 2022-11-19 DIAGNOSIS — Z881 Allergy status to other antibiotic agents status: Secondary | ICD-10-CM | POA: Insufficient documentation

## 2022-11-19 DIAGNOSIS — R1012 Left upper quadrant pain: Secondary | ICD-10-CM | POA: Diagnosis not present

## 2022-11-19 DIAGNOSIS — Z87440 Personal history of urinary (tract) infections: Secondary | ICD-10-CM | POA: Insufficient documentation

## 2022-11-19 DIAGNOSIS — Z823 Family history of stroke: Secondary | ICD-10-CM | POA: Insufficient documentation

## 2022-11-19 DIAGNOSIS — Z87891 Personal history of nicotine dependence: Secondary | ICD-10-CM | POA: Insufficient documentation

## 2022-11-19 LAB — CBC WITH DIFFERENTIAL/PLATELET
Abs Immature Granulocytes: 0.01 10*3/uL (ref 0.00–0.07)
Basophils Absolute: 0 10*3/uL (ref 0.0–0.1)
Basophils Relative: 1 %
Eosinophils Absolute: 0.2 10*3/uL (ref 0.0–0.5)
Eosinophils Relative: 4 %
HCT: 35.6 % — ABNORMAL LOW (ref 36.0–46.0)
Hemoglobin: 11.5 g/dL — ABNORMAL LOW (ref 12.0–15.0)
Immature Granulocytes: 0 %
Lymphocytes Relative: 28 %
Lymphs Abs: 1.4 10*3/uL (ref 0.7–4.0)
MCH: 31.8 pg (ref 26.0–34.0)
MCHC: 32.3 g/dL (ref 30.0–36.0)
MCV: 98.3 fL (ref 80.0–100.0)
Monocytes Absolute: 0.4 10*3/uL (ref 0.1–1.0)
Monocytes Relative: 7 %
Neutro Abs: 3 10*3/uL (ref 1.7–7.7)
Neutrophils Relative %: 60 %
Platelets: 281 10*3/uL (ref 150–400)
RBC: 3.62 MIL/uL — ABNORMAL LOW (ref 3.87–5.11)
RDW: 12.8 % (ref 11.5–15.5)
WBC: 4.9 10*3/uL (ref 4.0–10.5)
nRBC: 0 % (ref 0.0–0.2)

## 2022-11-19 LAB — COMPREHENSIVE METABOLIC PANEL
ALT: 15 U/L (ref 0–44)
AST: 22 U/L (ref 15–41)
Albumin: 3.9 g/dL (ref 3.5–5.0)
Alkaline Phosphatase: 118 U/L (ref 38–126)
Anion gap: 7 (ref 5–15)
BUN: 21 mg/dL (ref 8–23)
CO2: 25 mmol/L (ref 22–32)
Calcium: 8.9 mg/dL (ref 8.9–10.3)
Chloride: 103 mmol/L (ref 98–111)
Creatinine, Ser: 0.95 mg/dL (ref 0.44–1.00)
GFR, Estimated: >60 mL/min (ref >60)
Glucose, Bld: 142 mg/dL — ABNORMAL HIGH (ref 70–99)
Potassium: 4 mmol/L (ref 3.5–5.1)
Sodium: 135 mmol/L (ref 135–145)
Total Bilirubin: 0.4 mg/dL (ref 0.3–1.2)
Total Protein: 7.7 g/dL (ref 6.5–8.1)

## 2022-11-20 ENCOUNTER — Encounter: Payer: Self-pay | Admitting: Oncology

## 2022-11-20 LAB — CANCER ANTIGEN 19-9: CA 19-9: 30 U/mL (ref 0–35)

## 2022-11-20 NOTE — Progress Notes (Signed)
Hematology/Oncology Consult note [facility]  Telephone:([phone] [phone]  Patient Care Team: Corky Downs, MD as PCP - General (Internal Medicine) Cherre Huger, DC as Referring Physician (Chiropractic Medicine) Kieth Brightly, MD (General Surgery) Creig Hines, MD as Consulting Physician (Hematology and Oncology)   Name of the patient: Sheryl Perez  [id number]  December 23, 1960   Date of visit: 11/20/22  Diagnosis- pancreatic adenocarcinoma stage II apT2 N1 M0 currently in remission   Chief complaint/ Reason for visit- routine f/u of pancreatic cancer  Heme/Onc history: patient is a 62 year old female with a history of irritable bowel disease for which she follows up with [facility].  She has undergone EGD and colonoscopy with them in the past.  More recently patient was complaining of left upper quadrant and epigastric abdominal pain which causes sudden episodes of abdominal spasms and was prescribed as needed tramadol for it.  These episodes of pain are unrelated to food and are dull aching or throbbing.  She has undergone cholecystectomy in the past.  She underwent CT abdomen and pelvis with contrast in the ER on 08/04/2020 Which did not reveal any acute pathology which showed a possible hypoechoic 1.9 cm right hepatic lobe lesion.  This was followed by an MRI which showed no discrete lesion in the liver and the area of concern noted on CT scan was more likely to represent variable fat deposition.  However there was loss of normal T1 signal in the peripheral pancreas and a potential small pancreatic lesion suspicious for early pancreatic adenocarcinoma in the neck of the pancreas.     Patient underwent EUS by Dr. Shana Chute which showed an irregular mass in the pancreatic neck measuring 15 x 11 mm.  FNA showed adenocarcinoma.  Pancreatic duct measured 1.1 mm in the head with abrupt dilatation in the region of the mass to 4.1 mm.  No abnormality  noted in the common bile duct and hepatic duct.  Celiac region showed no significant endosonographic abnormality.  No lymphadenopathy was seen.  CA 19-9 mildly elevated at 35   Patient was seen by Dr. Gwenlyn Perking from pancreaticobiliary surgery at [facility] for surgical opinion.  Although patient has a small lesion involving the pancreatic neck, there was a concern for tumor extension towards the splenic vein and narrowing of portal splenic confluence.  Patient received 3 months of neoadjuvant modified FOLFIRINOX chemotherapyAnd then underwent definitive Whipple surgery at [facility] with Dr. Gwenlyn Perking on 01/13/2021.  Final pathology showed 2.3 cm grade 2 moderately differentiated adenocarcinoma with lymphovascular and perineural invasion.  Treatment effect present with residual cancer showing evident tumor regression but more than single cells or rare small group of cancer cells] partial response.  Margins negative.  1 out of 22 lymph nodes positive for malignancy.   Patient completed adjuvant chemotherapy in March 2023.  She remains in remission  Interval history- she is doing well overall. Appetite and weight have remained normal. Denies any abdominal pain or distension  ECOG PS- 1 Pain scale- 0 Opioid associated constipation- no  Review of systems- Review of Systems  Constitutional:  Negative for chills, fever, malaise/fatigue and weight loss.  HENT:  Negative for congestion, ear discharge and nosebleeds.   Eyes:  Negative for blurred vision.  Respiratory:  Negative for cough, hemoptysis, sputum production, shortness of breath and wheezing.   Cardiovascular:  Negative for chest pain, palpitations, orthopnea and claudication.  Gastrointestinal:  Negative for abdominal pain, blood in stool, constipation, diarrhea, heartburn, melena, nausea and  vomiting.  Genitourinary:  Negative for dysuria, flank pain, frequency, hematuria and urgency.  Musculoskeletal:  Negative for back pain, joint pain and myalgias.   Skin:  Negative for rash.  Neurological:  Negative for dizziness, tingling, focal weakness, seizures, weakness and headaches.  Endo/Heme/Allergies:  Does not bruise/bleed easily.  Psychiatric/Behavioral:  Negative for depression and suicidal ideas. The patient does not have insomnia.       Allergies  Allergen Reactions   Sulfacetamide Sodium Swelling    Diarrhea,dizziness, tongue swelling   Other Other (See Comments) and Swelling    PT states that anything with the sides effects "may cause rash" she cannot take due to her psoriasis, it will make the patches on her hand so thick she can't use her hands.  Diarrhea,dizziness, tongue swelling   Oxycontin [Oxycodone Hcl] Nausea And Vomiting   Sulfa Antibiotics Swelling    Diarrhea,dizziness, tongue swelling   Abilify [Aripiprazole] Other (See Comments)   Cephalosporins Rash   Erythromycin Other (See Comments)    Abd. pain Abd. pain Other reaction(s): Other (See Comments) Abd. 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    Bipolar disorder (HCC)    Chest pain, unspecified    Chronic low back pain 10/23/2013   Chronic UTI    COPD (chronic obstructive pulmonary disease) (HCC)    Depression    Dysfunction of eustachian tube    Enlargement of lymph nodes    Family history of brain cancer    Family history of breast cancer    Family history of prostate cancer    History of Bell's palsy Right   Injury, other and unspecified, knee, leg, ankle, and foot    MRSA (methicillin resistant staph aureus) culture positive 07/11/2014   abscess   Other specified disease of hair and hair follicles    Pancreatic cancer (HCC)    Pancreatic cancer (HCC)    Rectal prolapse    Unspecified symptom associated with female genital  organs      Past Surgical History:  Procedure Laterality Date   ABDOMINAL HYSTERECTOMY     bladder tack  2010   CHOLECYSTECTOMY  2002   FOOT SURGERY Bilateral 2004   bunion   GANGLION CYST EXCISION  2002   INCISION AND DRAINAGE ABSCESS  04-16-15   PARTIAL HYSTERECTOMY  2010   uterus removed   PORTA CATH INSERTION N/A 09/11/2020   Procedure: PORTA CATH INSERTION;  Surgeon: Annice Needy, MD;  Location: [facility] INVASIVE CV LAB;  Service: Cardiovascular;  Laterality: N/A;   PORTA CATH REMOVAL N/A 04/26/2022   Procedure: PORTA CATH REMOVAL;  Surgeon: Annice Needy, MD;  Location: [facility] INVASIVE CV LAB;  Service: Cardiovascular;  Laterality: N/A;    Social History   Socioeconomic History   Marital status: Married    Spouse name: Not on file   Number of children: 3   Years of education: hs   Highest education level: Some college, no degree  Occupational History   Not on file  Tobacco Use   Smoking status: Former    Current packs/day: 0.00    Average packs/day: 0.5 packs/day for 40.0 years (20.0 ttl pk-yrs)    Types: Cigarettes    Start date: 07/14/1980  Quit date: 07/14/2020    Years since quitting: 2.3   Smokeless tobacco: Never  Vaping Use   Vaping status: Never Used  Substance and Sexual Activity   Alcohol use: No    Alcohol/week: 1.0 standard drink of alcohol    Types: 1 Glasses of wine per week   Drug use: Yes    Frequency: 7.0 times per week    Types: Marijuana   Sexual activity: Yes    Birth control/protection: None  Other Topics Concern   Not on file  Social History Narrative   Patient lives at home with her husband Greig Castilla).   Patient works full time.   Education CNA    Right handed.   Caffeine Tea , Soda.   Social Determinants of Health   Financial Resource Strain: Low Risk  (01/23/2021)   Received from [facility], [facility]   Overall Financial Resource Strain (CARDIA)    Difficulty of Paying Living Expenses: Not hard at  all  Food Insecurity: No Food Insecurity (01/23/2021)   Received from [facility], [facility]   Hunger Vital Sign    Worried About Running Out of Food in the Last Year: Never true    Ran Out of Food in the Last Year: Never true  Transportation Needs: No Transportation Needs (01/23/2021)   Received from [facility], [facility]   [facility] - Transportation    In the past 12 months, has lack of transportation kept you from medical appointments or from getting medications?: No    Lack of Transportation (Non-Medical): No  Physical Activity: Inactive (01/10/2017)   Exercise Vital Sign    Days of Exercise per Week: 0 days    Minutes of Exercise per Session: 0 min  Stress: Stress Concern Present (01/10/2017)   [employer] of Occupational Health - Occupational Stress Questionnaire    Feeling of Stress : Very much  Social Connections: Somewhat Isolated (01/10/2017)   Social Connection and Isolation Panel [NHANES]    Frequency of Communication with Friends and Family: More than three times a week    Frequency of Social Gatherings with Friends and Family: More than three times a week    Attends Religious Services: More than 4 times per year    Active Member of [employer] or Organizations: No    Attends Banker Meetings: Never    Marital Status: Separated  Intimate Partner Violence: Not At Risk (01/10/2017)   Humiliation, Afraid, Rape, and Kick questionnaire    Fear of Current or Ex-Partner: No    Emotionally Abused: No    Physically Abused: No    Sexually Abused: No    Family History  Problem Relation Age of Onset   Diabetes Father    Cancer Father        brain tumor   Depression Father    Depression Sister    Alcohol abuse Brother    Depression Brother    Leukemia Maternal Aunt    Prostate cancer Maternal Uncle    Breast cancer Paternal Aunt        dx >50   Heart disease Maternal Grandmother     Heart attack Maternal Grandmother    Stroke Maternal Grandmother    Parkinson's disease Maternal Grandfather    Leukemia Paternal Grandmother    Diabetes Paternal Grandfather    Breast cancer Cousin    Brain cancer Niece        dx under  50     Current Outpatient Medications:    albuterol (VENTOLIN HFA) 108 (90 Base) MCG/ACT inhaler, TAKE 2 PUFFS BY MOUTH EVERY 6 HOURS AS NEEDED FOR WHEEZE OR SHORTNESS OF BREATH, Disp: 8 each, Rfl: 4   calcium-vitamin D (OSCAL WITH D) 500-5 MG-MCG tablet, Take 1 tablet by mouth daily., Disp: , Rfl:    divalproex (DEPAKOTE) 250 MG DR tablet, Take 1 tablet (250 mg total) by mouth daily., Disp: 90 tablet, Rfl: 1   famotidine (PEPCID) 20 MG tablet, TAKE 1 TABLET BY MOUTH TWICE A DAY, Disp: 180 tablet, Rfl: 3   KLOR-CON M20 20 MEQ tablet, TAKE 1 TABLET BY MOUTH EVERY DAY, Disp: 90 tablet, Rfl: 1   loperamide (IMODIUM A-D) 2 MG tablet, Take 2 at onset of diarrhea, then 1 every 2hrs until 12hr without a BM. May take 2 tab every 4hrs at bedtime. If diarrhea recurs repeat., Disp: 100 tablet, Rfl: 1   loratadine (CLARITIN) 10 MG tablet, TAKE 1 TABLET BY MOUTH EVERY DAY, Disp: 90 tablet, Rfl: 1   nitrofurantoin, macrocrystal-monohydrate, (MACROBID) 100 MG capsule, Take 100 mg by mouth every 12 (twelve) hours., Disp: , Rfl:    propranolol (INDERAL) 20 MG tablet, TAKE 1 TABLET BY MOUTH THREE TIMES A DAY, Disp: 270 tablet, Rfl: 1   Ascorbic Acid (VITAMIN C) 1000 MG tablet, Take 1,000 mg by mouth daily. (Patient not taking: Reported on 10/27/2022), Disp: , Rfl:    fluticasone (FLONASE) 50 MCG/ACT nasal spray, Place 2 sprays into both nostrils as needed for allergies or rhinitis. (Patient not taking: Reported on 11/19/2022), Disp: , Rfl:    QUEtiapine (SEROQUEL) 25 MG tablet, TAKE 1 TABLET BY MOUTH EVERYDAY AT BEDTIME (Patient not taking: Reported on 11/19/2022), Disp: 90 tablet, Rfl: 1 No current facility-administered medications for this visit.  Facility-Administered  Medications Ordered in Other Visits:    heparin lock flush 100 UNIT/ML injection, , , ,    heparin lock flush 100 UNIT/ML injection, , , ,    heparin lock flush 100 unit/mL, 500 Units, Intravenous, Once, Creig Hines, MD   heparin lock flush 100 unit/mL, 500 Units, Intravenous, Once, Creig Hines, MD   sodium chloride flush (NS) 0.9 % injection 10 mL, 10 mL, Intracatheter, PRN, Creig Hines, MD, 10 mL at 11/03/20 1441   sodium chloride flush (NS) 0.9 % injection 10 mL, 10 mL, Intravenous, PRN, Creig Hines, MD  Physical exam:  Vitals:   11/19/22 1029  BP: (!) 127/58  Pulse: 61  Resp: 18  Temp: (!) 95.9 F (35.5 C)  TempSrc: Tympanic  SpO2: 99%  Weight: 125 lb 3.2 oz (56.8 kg)  Height: 4\' 11"  (1.499 m)   Physical Exam Cardiovascular:     Rate and Rhythm: Normal rate and regular rhythm.     Heart sounds: Normal heart sounds.  Pulmonary:     Effort: Pulmonary effort is normal.     Breath sounds: Normal breath sounds.  Abdominal:     General: Bowel sounds are normal.     Palpations: Abdomen is soft.  Skin:    General: Skin is warm and dry.  Neurological:     Mental Status: She is alert and oriented to person, place, and time.         Latest Ref Rng & Units 11/19/2022   10:01 AM  CMP  Glucose 70 - 99 mg/dL 161   BUN 8 - 23 mg/dL 21   Creatinine 0.96 - 1.00 mg/dL 0.45  Sodium 135 - 145 mmol/L 135   Potassium 3.5 - 5.1 mmol/L 4.0   Chloride 98 - 111 mmol/L 103   CO2 22 - 32 mmol/L 25   Calcium 8.9 - 10.3 mg/dL 8.9   Total Protein 6.5 - 8.1 g/dL 7.7   Total Bilirubin 0.3 - 1.2 mg/dL 0.4   Alkaline Phos 38 - 126 U/L 118   AST 15 - 41 U/L 22   ALT 0 - 44 U/L 15       Latest Ref Rng & Units 11/19/2022   10:01 AM  CBC  WBC 4.0 - 10.5 K/uL 4.9   Hemoglobin 12.0 - 15.0 g/dL 01.0   Hematocrit 27.2 - 46.0 % 35.6   Platelets 150 - 400 K/uL 281     Assessment and plan- Patient is a 62 y.o. female with h/o pancreatic adenocarcinoma stage II apT2 N1 M0. This is  a routine f/u visit  Clinically patient is doing well with no signs and symptoms of recurrence based on todays exam. She has been getting surveillance scans at [facility] and scans from June 2024 did not show any evidence of metastatic disease. Ca 19-9 is normal. She remains in remission. She has chronic hypokalemia for which she is PO potassium which she will continue. I will see her back in 6 months wth labs   Visit Diagnosis 1. Encounter for follow-up surveillance of pancreatic cancer   2. Pancreatic adenocarcinoma [facility])      Dr. Owens Shark, MD, MPH [facility] at [facility] [phone] 11/20/2022 4:51 PM

## 2022-12-20 ENCOUNTER — Encounter: Payer: Self-pay | Admitting: Podiatry

## 2022-12-22 ENCOUNTER — Ambulatory Visit: Payer: BC Managed Care – PPO

## 2022-12-22 ENCOUNTER — Encounter: Payer: Self-pay | Admitting: Podiatry

## 2022-12-22 ENCOUNTER — Ambulatory Visit: Payer: BC Managed Care – PPO | Admitting: Podiatry

## 2022-12-22 DIAGNOSIS — S8490XA Injury of unspecified nerve at lower leg level, unspecified leg, initial encounter: Secondary | ICD-10-CM

## 2022-12-22 DIAGNOSIS — M779 Enthesopathy, unspecified: Secondary | ICD-10-CM

## 2022-12-22 DIAGNOSIS — S9030XA Contusion of unspecified foot, initial encounter: Secondary | ICD-10-CM

## 2022-12-22 MED ORDER — METHYLPREDNISOLONE 4 MG PO TBPK: ORAL_TABLET | ORAL | 0 refills | Status: DC

## 2022-12-23 NOTE — Progress Notes (Signed)
  Subjective:  Patient ID: Sheryl Perez, female    DOB: 05-Nov-1960,  MRN: [id number]  Chief Complaint  Patient presents with   Foot Pain    "My feet hurt on the side of my feet and it travels up to my ankles."    62 y.o. female presents with the above complaint. History confirmed with patient.  This started a few weeks ago when she woke up in their camper and kicked out the window and the window closed on her feet  Objective:  Physical Exam: warm, good capillary refill, no trophic changes or ulcerative lesions, normal DP and PT pulses, normal sensory exam, and mild dorsal midfoot tenderness.  No instability ecchymosis or gross deformity indicate fracture   Radiographs: Multiple views x-ray of both feet: No fracture she does have multiple midfoot and hindfoot degenerative changes Assessment:   1. Contusion of foot, unspecified laterality, initial encounter   2. Neuropraxia of lower extremity      Plan:  Patient was evaluated and treated and all questions answered.  Suspect she has direct bony contusion and exacerbation of existing osteoarthritis from her injury.  There is no breakdown of skin or indication of fracture or stress fracture.  She may be weightbearing as tolerated in a regular shoe.  Rx for methylprednisone taper sent to pharmacy to use to reduce inflammation discussed RICE for injuries like this.  Follow-up as needed if it does not improve.  If not improving would need MRI to evaluate further  Return if symptoms worsen or fail to improve.

## 2022-12-24 ENCOUNTER — Telehealth: Payer: Self-pay | Admitting: Psychiatry

## 2022-12-24 NOTE — Telephone Encounter (Signed)
Please let patient know if she is not on lithium.  I never prescribed her lithium.  Please advise her to get immediate help if she is having any medical concerns.  Please let her know to schedule a sooner appointment and we can discuss medication changes at the visit.

## 2022-12-24 NOTE — Telephone Encounter (Signed)
Patient called wanting a sooner appointment. Wants to know if you will order a blood test. She thinks her lithium level is high. She is major manic she says. She has white foam coming out of corners of mouth like it was years ago. This is why she wants a blood test. Please call to advise

## 2022-12-24 NOTE — Telephone Encounter (Signed)
Noted  

## 2022-12-28 NOTE — Telephone Encounter (Signed)
Yes in writing.

## 2022-12-28 NOTE — Telephone Encounter (Signed)
Patient daughter called wanting to speak to you. She has taken her mother to Emergency Department at [facility]. Very manic and does not believe she has taken any medication. Approved to admit her to any psychiatric hospital in [place]. Wants to speak to you to have you aware of the situation.

## 2022-12-28 NOTE — Telephone Encounter (Signed)
We will need an ROI signed by patient giving [place] permission to speak to anyone.

## 2022-12-29 ENCOUNTER — Telehealth: Payer: Self-pay

## 2022-12-29 NOTE — Telephone Encounter (Signed)
We do not have an ROI signed to talk to daughter .

## 2022-12-29 NOTE — Telephone Encounter (Signed)
pt called and left a message last night at 8:15pm. she states that she was admitted to [facility] and is in the psyh ward. she states that she is suppose to be transfered to [facility] in [place]. pt asked if you can call her daughter Lynett Grimes at [phone].  Pt was last seen on 9-18 next appt 1-2

## 2022-12-29 NOTE — Telephone Encounter (Signed)
Noted-pt can not be notified due to she is admitted to hospital

## 2023-01-10 ENCOUNTER — Encounter: Payer: Self-pay | Admitting: Podiatry

## 2023-01-10 ENCOUNTER — Ambulatory Visit (INDEPENDENT_AMBULATORY_CARE_PROVIDER_SITE_OTHER): Payer: BC Managed Care – PPO | Admitting: Podiatry

## 2023-01-10 ENCOUNTER — Ambulatory Visit: Payer: BC Managed Care – PPO | Admitting: Podiatry

## 2023-01-10 ENCOUNTER — Telehealth: Payer: Self-pay | Admitting: Psychiatry

## 2023-01-10 DIAGNOSIS — S9030XA Contusion of unspecified foot, initial encounter: Secondary | ICD-10-CM | POA: Diagnosis not present

## 2023-01-10 DIAGNOSIS — L409 Psoriasis, unspecified: Secondary | ICD-10-CM

## 2023-01-10 DIAGNOSIS — S8490XA Injury of unspecified nerve at lower leg level, unspecified leg, initial encounter: Secondary | ICD-10-CM | POA: Diagnosis not present

## 2023-01-10 MED ORDER — CLOBETASOL PROPIONATE 0.05 % EX CREA: 1.0000 | TOPICAL_CREAM | Freq: Two times a day (BID) | CUTANEOUS | 2 refills | Status: AC

## 2023-01-10 NOTE — Progress Notes (Unsigned)
  Subjective:  Patient ID: Sheryl Perez, female    DOB: April 29, 1960,  MRN: [id number]  Follow-up on foot pain and contusion  62 y.o. female presents with the above complaint. History confirmed with patient.  She notes she is doing much better and is ready to go back to work tomorrow.  She notes a new rash on the inside of the ankle she has a history of psoriasis and this seems to be a flare  Objective:  Physical Exam: warm, good capillary refill, no trophic changes or ulcerative lesions, normal DP and PT pulses, normal sensory exam, and dorsal midfoot tenderness has resolved, no residual neuritic pain, she has scaling psoriatic rash medial heel and left ankle   Radiographs: Multiple views x-ray of both feet: No fracture she does have multiple midfoot and hindfoot degenerative changes Assessment:   1. Contusion of foot, unspecified laterality, initial encounter   2. Neuropraxia of lower extremity   3. Psoriasis      Plan:  Patient was evaluated and treated and all questions answered.  Doing much better and injury appears to have resolved at this point, she may return to full activity and work as tolerated without restrictions  Having flare of psoriatic skin disease with breakout on left foot.  Rx for Temovate sent to pharmacy.  Use as needed twice daily until it resolves.  Return to see me as needed for this.  Return if symptoms worsen or fail to improve.

## 2023-01-10 NOTE — Telephone Encounter (Signed)
Noted, thank you for the update. Yes , we did not have an ROI on file . There is no record of a call received from the hospital.

## 2023-01-10 NOTE — Telephone Encounter (Signed)
Patient left voice message stating she is disappointed that office did not speak to her daughter regarding her admittance to [facility]. There was no ROI on file to speak to her daughter therefore no call was made to discuss her condition. Also states that office did not return calls to hospital. Hospital did not call as far as I am aware for discharge appointment or to discuss patient condition. Patient states she will not return to office and will come by to sign ROI for medical records to be sent to [facility] where she now has appointment as of today, 01/10/23. Future appointment cancelled per her request.

## 2023-01-12 ENCOUNTER — Other Ambulatory Visit: Payer: Self-pay | Admitting: Psychiatry

## 2023-01-12 DIAGNOSIS — F3178 Bipolar disorder, in full remission, most recent episode mixed: Secondary | ICD-10-CM

## 2023-01-20 ENCOUNTER — Telehealth: Payer: Self-pay | Admitting: Podiatry

## 2023-01-20 NOTE — Telephone Encounter (Signed)
Received [employer]-FMLA documents and [employer]-STD documents -- s/w patient yesterday to confirm receipt .... Completed both and sent by fax today ...   [employer]na Antis Fax# [phone] & [employer] - Fax# [phone] ....   Per request from patient, mailed both sets to her alt addr:  [address], [address]; [place], [place]  [address].....     J. Abbott -- 01/20/2023

## 2023-02-10 ENCOUNTER — Ambulatory Visit: Payer: BC Managed Care – PPO | Admitting: Psychiatry

## 2023-04-17 ENCOUNTER — Other Ambulatory Visit: Payer: Self-pay | Admitting: Oncology

## 2023-04-18 ENCOUNTER — Encounter: Payer: Self-pay | Admitting: Oncology

## 2023-04-18 ENCOUNTER — Other Ambulatory Visit: Payer: Self-pay

## 2023-04-18 MED ORDER — FAMOTIDINE 20 MG PO TABS: 20.0000 mg | ORAL_TABLET | Freq: Two times a day (BID) | ORAL | 3 refills | Status: AC

## 2023-04-25 ENCOUNTER — Telehealth: Payer: Self-pay | Admitting: *Deleted

## 2023-04-25 DIAGNOSIS — E860 Dehydration: Secondary | ICD-10-CM

## 2023-04-25 DIAGNOSIS — C259 Malignant neoplasm of pancreas, unspecified: Secondary | ICD-10-CM

## 2023-04-25 NOTE — Telephone Encounter (Signed)
 Pt called to see that if she can come and get fluids tom. Afternoon. I told the pt that we are still on shortage of NS. You can come in and get labs and see Josha Borders and if any of the labs are low then she might get fluids  but only if the labs are low. Also she will need to come tom. At 1;30 AND THEN SEE jOSH 1;45. Pt. Is good for this

## 2023-04-26 ENCOUNTER — Inpatient Hospital Stay: Attending: Oncology

## 2023-04-26 ENCOUNTER — Encounter: Payer: Self-pay | Admitting: Hospice and Palliative Medicine

## 2023-04-26 ENCOUNTER — Inpatient Hospital Stay (HOSPITAL_BASED_OUTPATIENT_CLINIC_OR_DEPARTMENT_OTHER): Admitting: Hospice and Palliative Medicine

## 2023-04-26 ENCOUNTER — Inpatient Hospital Stay

## 2023-04-26 ENCOUNTER — Other Ambulatory Visit: Payer: Self-pay

## 2023-04-26 VITALS — BP 113/53 | HR 70 | Temp 97.2°F | Resp 16 | Wt 130.2 lb

## 2023-04-26 DIAGNOSIS — F319 Bipolar disorder, unspecified: Secondary | ICD-10-CM | POA: Insufficient documentation

## 2023-04-26 DIAGNOSIS — Z888 Allergy status to other drugs, medicaments and biological substances status: Secondary | ICD-10-CM | POA: Diagnosis not present

## 2023-04-26 DIAGNOSIS — Z90710 Acquired absence of both cervix and uterus: Secondary | ICD-10-CM | POA: Diagnosis not present

## 2023-04-26 DIAGNOSIS — D649 Anemia, unspecified: Secondary | ICD-10-CM | POA: Diagnosis not present

## 2023-04-26 DIAGNOSIS — Z90411 Acquired partial absence of pancreas: Secondary | ICD-10-CM | POA: Insufficient documentation

## 2023-04-26 DIAGNOSIS — Z881 Allergy status to other antibiotic agents status: Secondary | ICD-10-CM | POA: Diagnosis not present

## 2023-04-26 DIAGNOSIS — C259 Malignant neoplasm of pancreas, unspecified: Secondary | ICD-10-CM

## 2023-04-26 DIAGNOSIS — Z885 Allergy status to narcotic agent status: Secondary | ICD-10-CM | POA: Insufficient documentation

## 2023-04-26 DIAGNOSIS — Z79899 Other long term (current) drug therapy: Secondary | ICD-10-CM | POA: Insufficient documentation

## 2023-04-26 DIAGNOSIS — R5383 Other fatigue: Secondary | ICD-10-CM | POA: Diagnosis not present

## 2023-04-26 DIAGNOSIS — J449 Chronic obstructive pulmonary disease, unspecified: Secondary | ICD-10-CM | POA: Diagnosis not present

## 2023-04-26 DIAGNOSIS — Z9049 Acquired absence of other specified parts of digestive tract: Secondary | ICD-10-CM | POA: Insufficient documentation

## 2023-04-26 DIAGNOSIS — Z882 Allergy status to sulfonamides status: Secondary | ICD-10-CM | POA: Insufficient documentation

## 2023-04-26 DIAGNOSIS — Z87440 Personal history of urinary (tract) infections: Secondary | ICD-10-CM | POA: Diagnosis not present

## 2023-04-26 DIAGNOSIS — E860 Dehydration: Secondary | ICD-10-CM

## 2023-04-26 LAB — COMPREHENSIVE METABOLIC PANEL
ALT: 10 U/L (ref 0–44)
AST: 17 U/L (ref 15–41)
Albumin: 3.2 g/dL — ABNORMAL LOW (ref 3.5–5.0)
Alkaline Phosphatase: 74 U/L (ref 38–126)
Anion gap: 7 (ref 5–15)
BUN: 11 mg/dL (ref 8–23)
CO2: 26 mmol/L (ref 22–32)
Calcium: 8.2 mg/dL — ABNORMAL LOW (ref 8.9–10.3)
Chloride: 104 mmol/L (ref 98–111)
Creatinine, Ser: 0.96 mg/dL (ref 0.44–1.00)
GFR, Estimated: >60 mL/min (ref >60)
Glucose, Bld: 151 mg/dL — ABNORMAL HIGH (ref 70–99)
Potassium: 3.9 mmol/L (ref 3.5–5.1)
Sodium: 137 mmol/L (ref 135–145)
Total Bilirubin: 0.3 mg/dL (ref 0.0–1.2)
Total Protein: 6.4 g/dL — ABNORMAL LOW (ref 6.5–8.1)

## 2023-04-26 LAB — CBC WITH DIFFERENTIAL/PLATELET
Abs Immature Granulocytes: 0.02 10*3/uL (ref 0.00–0.07)
Basophils Absolute: 0 10*3/uL (ref 0.0–0.1)
Basophils Relative: 1 %
Eosinophils Absolute: 0.3 10*3/uL (ref 0.0–0.5)
Eosinophils Relative: 6 %
HCT: 31.4 % — ABNORMAL LOW (ref 36.0–46.0)
Hemoglobin: 10 g/dL — ABNORMAL LOW (ref 12.0–15.0)
Immature Granulocytes: 1 %
Lymphocytes Relative: 31 %
Lymphs Abs: 1.3 10*3/uL (ref 0.7–4.0)
MCH: 33 pg (ref 26.0–34.0)
MCHC: 31.8 g/dL (ref 30.0–36.0)
MCV: 103.6 fL — ABNORMAL HIGH (ref 80.0–100.0)
Monocytes Absolute: 0.4 10*3/uL (ref 0.1–1.0)
Monocytes Relative: 10 %
Neutro Abs: 2.2 10*3/uL (ref 1.7–7.7)
Neutrophils Relative %: 51 %
Platelets: 182 10*3/uL (ref 150–400)
RBC: 3.03 MIL/uL — ABNORMAL LOW (ref 3.87–5.11)
RDW: 13.8 % (ref 11.5–15.5)
WBC: 4.2 10*3/uL (ref 4.0–10.5)
nRBC: 0 % (ref 0.0–0.2)

## 2023-04-26 LAB — FERRITIN: Ferritin: 11 ng/mL (ref 11–307)

## 2023-04-26 LAB — IRON AND TIBC
Iron: 70 ug/dL (ref 28–170)
Saturation Ratios: 17 % (ref 10.4–31.8)
TIBC: 414 ug/dL (ref 250–450)
UIBC: 344 ug/dL

## 2023-04-26 NOTE — Progress Notes (Signed)
 Patient here to have potassium level checked.  Recently feeling more fatigued which may be due to seasonal allergies.

## 2023-04-26 NOTE — Progress Notes (Signed)
 Symptom Management Clinic [facility] at [facility] Telephone:([phone]) [phone] Fax:(336) [phone]  Patient Care Team: Corky Downs, MD as PCP - General (Internal Medicine) Cherre Huger, DC as Referring Physician (Chiropractic Medicine) Kieth Brightly, MD (General Surgery) Creig Hines, MD as Consulting Physician (Hematology and Oncology)   NAME OF PATIENT: Sheryl Perez  [id number]  1961-02-04   DATE OF VISIT: 04/26/23  REASON FOR CONSULT: Sheryl Perez is a 63 y.o. female with multiple medical problems including stage II pancreatic cancer status post 3 months of neoadjuvant modified FOLFIRINOX chemotherapy after which patient underwent definitive Whipple surgery at [facility] on 01/13/2021.  Patient completed adjuvant chemotherapy in March 2023.  INTERVAL HISTORY: Patient last saw Dr. Smith Robert on 11/19/2022 at which time patient was doing well.  There were no signs or symptoms of recurrence.  CA 19-9 was normal and patient was felt to be in remission.  Patient requested [facility] visit today with complaint of generalized fatigue over the past several weeks.  Denies fever or chills.  Denies changes in appetite or weight loss.  Has actually gained several pounds.  Denies depressive symptoms but does have history of bipolar.  Gets occasionally tearful.  Denies GI or GU symptoms.  No recent illnesses or infections.  Denies dark or tarry stools.  Denies any neurologic complaints. Denies recent fevers or illnesses. Denies any easy bleeding or bruising. Reports good appetite and denies weight loss. Denies chest pain. Denies any nausea, vomiting, constipation, or diarrhea. Denies urinary complaints. Patient offers no further specific complaints today.   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    Bipolar disorder (HCC)    Chest pain, unspecified    Chronic low back pain 10/23/2013    Chronic UTI    COPD (chronic obstructive pulmonary disease) (HCC)    Depression    Dysfunction of eustachian tube    Enlargement of lymph nodes    Family history of brain cancer    Family history of breast cancer    Family history of prostate cancer    History of Bell's palsy Right   Injury, other and unspecified, knee, leg, ankle, and foot    MRSA (methicillin resistant staph aureus) culture positive 07/11/2014   abscess   Other specified disease of hair and hair follicles    Pancreatic cancer (HCC)    Pancreatic cancer (HCC)    Rectal prolapse    Unspecified symptom associated with female genital organs     PAST SURGICAL HISTORY:  Past Surgical History:  Procedure Laterality Date   ABDOMINAL HYSTERECTOMY     bladder tack  2010   CHOLECYSTECTOMY  2002   FOOT SURGERY Bilateral 2004   bunion   GANGLION CYST EXCISION  2002   INCISION AND DRAINAGE ABSCESS  04-16-15   PARTIAL HYSTERECTOMY  2010   uterus removed   PORTA CATH INSERTION N/A 09/11/2020   Procedure: PORTA CATH INSERTION;  Surgeon: Annice Needy, MD;  Location: [facility] INVASIVE CV LAB;  Service: Cardiovascular;  Laterality: N/A;   PORTA CATH REMOVAL N/A 04/26/2022   Procedure: PORTA CATH REMOVAL;  Surgeon: Annice Needy, MD;  Location: [facility] INVASIVE CV LAB;  Service: Cardiovascular;  Laterality: N/A;    HEMATOLOGY/ONCOLOGY HISTORY:  Oncology History  Pancreatic adenocarcinoma (HCC)  09/01/2020 Initial Diagnosis   Pancreatic adenocarcinoma (HCC)   09/01/2020 Cancer Staging   Staging form: Exocrine Pancreas, AJCC  8th Edition - Clinical stage from 09/01/2020: Stage IA (cT1, cN0, cM0) - Signed by Creig Hines, MD on 09/01/2020 Total positive nodes: 0   09/12/2020 - 04/20/2021 Chemotherapy   Patient is on Treatment Plan : PANCREAS Modified FOLFIRINOX q14d x 4 cycles      Genetic Testing   Negative genetic testing. No pathogenic variants identified on the [employer] CancerNext-Expanded+RNA Panel. VUS in NTHL1 identified. The report  date is 10/09/2020.  The CancerNext-Expanded + RNAinsight gene panel offered by [employer] and includes sequencing and rearrangement analysis for the following 77 genes: IP, ALK, APC*, ATM*, AXIN2, BAP1, BARD1, BLM, BMPR1A, BRCA1*, BRCA2*, BRIP1*, CDC73, CDH1*,CDK4, CDKN1B, CDKN2A, CHEK2*, CTNNA1, DICER1, FANCC, FH, FLCN, GALNT12, KIF1B, LZTR1, MAX, MEN1, MET, MLH1*, MSH2*, MSH3, MSH6*, MUTYH*, NBN, NF1*, NF2, NTHL1, PALB2*, PHOX2B, PMS2*, POT1, PRKAR1A, PTCH1, PTEN*, RAD51C*, RAD51D*,RB1, RECQL, RET, SDHA, SDHAF2, SDHB, SDHC, SDHD, SMAD4, SMARCA4, SMARCB1, SMARCE1, STK11, SUFU, TMEM127, TP53*,TSC1, TSC2, VHL and XRCC2 (sequencing and deletion/duplication); EGFR, EGLN1, HOXB13, KIT, MITF, PDGFRA, POLD1 and POLE (sequencing only); EPCAM and GREM1 (deletion/duplication only).   02/16/2021 Cancer Staging   Staging form: Exocrine Pancreas, AJCC 8th Edition - Pathologic stage from 02/16/2021: Stage IIB (pT2, pN1, cM0) - Signed by Creig Hines, MD on 02/16/2021 Residual tumor (R): R0 - None     ALLERGIES:  is allergic to sulfacetamide sodium, other, oxycontin [oxycodone hcl], sulfa antibiotics, abilify [aripiprazole], cephalosporins, erythromycin, lamotrigine, and risperidone.  MEDICATIONS:  Current Outpatient Medications  Medication Sig Dispense Refill   albuterol (VENTOLIN HFA) 108 (90 Base) MCG/ACT inhaler TAKE 2 PUFFS BY MOUTH EVERY 6 HOURS AS NEEDED FOR WHEEZE OR SHORTNESS OF BREATH 8 each 4   calcium-vitamin D (OSCAL WITH D) 500-5 MG-MCG tablet Take 1 tablet by mouth daily.     clobetasol cream (TEMOVATE) 0.05 % Apply 1 Application topically 2 (two) times daily. 60 g 2   divalproex (DEPAKOTE) 250 MG DR tablet Take 1 tablet (250 mg total) by mouth daily. 90 tablet 1   famotidine (PEPCID) 20 MG tablet TAKE 1 TABLET BY MOUTH TWICE A DAY 180 tablet 3   famotidine (PEPCID) 20 MG tablet Take 1 tablet (20 mg total) by mouth 2 (two) times daily. 180 tablet 3   KLOR-CON M20 20 MEQ tablet TAKE 1 TABLET BY  MOUTH EVERY DAY 90 tablet 1   loperamide (IMODIUM A-D) 2 MG tablet Take 2 at onset of diarrhea, then 1 every 2hrs until 12hr without a BM. May take 2 tab every 4hrs at bedtime. If diarrhea recurs repeat. 100 tablet 1   loratadine (CLARITIN) 10 MG tablet TAKE 1 TABLET BY MOUTH EVERY DAY 90 tablet 1   lurasidone (LATUDA) 40 MG TABS tablet Take 40 mg by mouth daily with breakfast.     methylPREDNISolone (MEDROL DOSEPAK) 4 MG TBPK tablet 6 day dose pack - take as directed 21 tablet 0   No current facility-administered medications for this visit.   Facility-Administered Medications Ordered in Other Visits  Medication Dose Route Frequency Provider Last Rate Last Admin   heparin lock flush 100 UNIT/ML injection            heparin lock flush 100 UNIT/ML injection            heparin lock flush 100 unit/mL  500 Units Intravenous Once Creig Hines, MD       heparin lock flush 100 unit/mL  500 Units Intravenous Once Creig Hines, MD       sodium chloride flush (NS)  0.9 % injection 10 mL  10 mL Intracatheter PRN Creig Hines, MD   10 mL at 11/03/20 1441   sodium chloride flush (NS) 0.9 % injection 10 mL  10 mL Intravenous PRN Creig Hines, MD        VITAL SIGNS: LMP 02/09/2008  There were no vitals filed for this visit.  Estimated body mass index is 25.29 kg/m as calculated from the following:   Height as of 11/19/22: 4\' 11"  (1.499 m).   Weight as of 11/19/22: 125 lb 3.2 oz (56.8 kg).  LABS: CBC:    Component Value Date/Time   WBC 4.2 04/26/2023 1329   HGB 10.0 (L) 04/26/2023 1329   HGB 11.6 02/22/2020 1006   HCT 31.4 (L) 04/26/2023 1329   HCT 34.7 02/22/2020 1006   PLT 182 04/26/2023 1329   PLT 380 02/22/2020 1006   MCV 103.6 (H) 04/26/2023 1329   MCV 98 (H) 02/22/2020 1006   MCV 91 04/18/2013 1026   NEUTROABS 2.2 04/26/2023 1329   NEUTROABS 3.0 02/22/2020 1006   LYMPHSABS 1.3 04/26/2023 1329   LYMPHSABS 2.2 02/22/2020 1006   MONOABS 0.4 04/26/2023 1329   EOSABS 0.3  04/26/2023 1329   EOSABS 0.3 02/22/2020 1006   BASOSABS 0.0 04/26/2023 1329   BASOSABS 0.0 02/22/2020 1006   Comprehensive Metabolic Panel:    Component Value Date/Time   NA 135 11/19/2022 1001   NA 138 02/22/2020 1006   NA 138 04/18/2013 1026   K 4.0 11/19/2022 1001   K 3.0 (L) 04/18/2013 1026   CL 103 11/19/2022 1001   CL 105 04/18/2013 1026   CO2 25 11/19/2022 1001   CO2 29 04/18/2013 1026   BUN 21 11/19/2022 1001   BUN 10 02/22/2020 1006   BUN 8 04/18/2013 1026   CREATININE 0.95 11/19/2022 1001   CREATININE 0.88 12/12/2019 1156   GLUCOSE 142 (H) 11/19/2022 1001   GLUCOSE 100 (H) 04/18/2013 1026   CALCIUM 8.9 11/19/2022 1001   CALCIUM 9.3 04/18/2013 1026   AST 22 11/19/2022 1001   ALT 15 11/19/2022 1001   ALKPHOS 118 11/19/2022 1001   BILITOT 0.4 11/19/2022 1001   BILITOT <0.2 02/22/2020 1006   PROT 7.7 11/19/2022 1001   PROT 6.6 02/22/2020 1006   ALBUMIN 3.9 11/19/2022 1001   ALBUMIN 4.2 02/22/2020 1006    RADIOGRAPHIC STUDIES: No results found.  PERFORMANCE STATUS (ECOG) : 0 - Asymptomatic  Review of Systems Unless otherwise noted, a complete review of systems is negative.  Physical Exam General: NAD Cardiovascular: regular rate and rhythm Pulmonary: clear ant fields Abdomen: soft, nontender, + bowel sounds GU: no suprapubic tenderness Extremities: no edema, no joint deformities Skin: no rashes Neurological: nonfocal  IMPRESSION/PLAN: History of stage II pancreatic cancer - status post Whipple and adjuvant chemotherapy.  Currently in remission.  No signs or symptoms of recurrence.  Fatigue -She is chronically and mildly anemic.  Will check iron panel, B12, and RBC folate.  She had recent TSH checked at [facility] which was normal.  In the absence of any of these findings, patient advised to follow-up with PCP for further workup.  Case and plan discussed with Dr. Smith Robert who patient will see on/11/25   Patient expressed understanding and was in agreement with  this plan. She also understands that She can call clinic at any time with any questions, concerns, or complaints.   Thank you for allowing me to participate in the care of this very pleasant patient.   Time  Total: 15 minutes  Visit consisted of counseling and education dealing with the complex and emotionally intense issues of symptom management in the setting of serious illness.Greater than 50%  of this time was spent counseling and coordinating care related to the above assessment and plan.  Signed by: Laurette Schimke, PhD, NP-C

## 2023-04-26 NOTE — Progress Notes (Signed)
 No fluids today.

## 2023-04-27 ENCOUNTER — Ambulatory Visit: Payer: BC Managed Care – PPO | Admitting: Psychiatry

## 2023-04-29 ENCOUNTER — Inpatient Hospital Stay

## 2023-04-29 ENCOUNTER — Other Ambulatory Visit: Payer: Self-pay | Admitting: Oncology

## 2023-04-29 ENCOUNTER — Telehealth: Payer: Self-pay | Admitting: Oncology

## 2023-04-29 ENCOUNTER — Telehealth: Payer: Self-pay

## 2023-04-29 VITALS — BP 116/52 | HR 64 | Resp 16

## 2023-04-29 DIAGNOSIS — E860 Dehydration: Secondary | ICD-10-CM

## 2023-04-29 DIAGNOSIS — C259 Malignant neoplasm of pancreas, unspecified: Secondary | ICD-10-CM | POA: Diagnosis not present

## 2023-04-29 MED ORDER — IRON SUCROSE 20 MG/ML IV SOLN
200.0000 mg | INTRAVENOUS | Status: DC
  Administered 2023-04-29: 200 mg via INTRAVENOUS
  Filled 2023-04-29: qty 10

## 2023-04-29 NOTE — Telephone Encounter (Signed)
 FMLA form received.  The print on page 4 is illegible.  Call placed to [facility] at [phone] requesting a new form be faxed to the office.

## 2023-04-29 NOTE — Telephone Encounter (Signed)
 Pt came to clinic to talk to sherry about the fmla paperwork. Secure chat was sent and sherry stated to have pt leave a message and that they were requesting another form and that Heather S. Was working on [employer].   The pt stated she wants to give her email address and that her work the paperwork to her email.  Her email is: [email or web address]   And if you need anything else to give her a call.

## 2023-05-02 ENCOUNTER — Encounter: Payer: Self-pay | Admitting: Oncology

## 2023-05-02 ENCOUNTER — Inpatient Hospital Stay

## 2023-05-02 NOTE — Telephone Encounter (Signed)
Form completed pending MD signature.

## 2023-05-02 NOTE — Telephone Encounter (Signed)
 Completed form faxed.

## 2023-05-02 NOTE — Telephone Encounter (Signed)
 New form has been received, completed, pending MD signature.

## 2023-05-05 ENCOUNTER — Inpatient Hospital Stay

## 2023-05-05 VITALS — BP 116/48 | HR 67 | Temp 98.0°F | Resp 16

## 2023-05-05 DIAGNOSIS — C259 Malignant neoplasm of pancreas, unspecified: Secondary | ICD-10-CM | POA: Diagnosis not present

## 2023-05-05 DIAGNOSIS — E860 Dehydration: Secondary | ICD-10-CM

## 2023-05-05 MED ORDER — IRON SUCROSE 20 MG/ML IV SOLN
200.0000 mg | INTRAVENOUS | Status: DC
  Administered 2023-05-05: 200 mg via INTRAVENOUS
  Filled 2023-05-05: qty 10

## 2023-05-05 MED ORDER — SODIUM CHLORIDE 0.9% FLUSH
10.0000 mL | Freq: Once | INTRAVENOUS | Status: AC | PRN
  Administered 2023-05-05: 10 mL
  Filled 2023-05-05: qty 10

## 2023-05-09 ENCOUNTER — Inpatient Hospital Stay

## 2023-05-09 VITALS — BP 126/41 | HR 79 | Temp 96.3°F | Resp 18

## 2023-05-09 DIAGNOSIS — E860 Dehydration: Secondary | ICD-10-CM

## 2023-05-09 DIAGNOSIS — C259 Malignant neoplasm of pancreas, unspecified: Secondary | ICD-10-CM | POA: Diagnosis not present

## 2023-05-09 MED ORDER — SODIUM CHLORIDE 0.9% FLUSH
10.0000 mL | Freq: Once | INTRAVENOUS | Status: DC | PRN
Start: 2023-05-09 — End: 2023-05-09
  Filled 2023-05-09: qty 10

## 2023-05-09 MED ORDER — IRON SUCROSE 20 MG/ML IV SOLN
200.0000 mg | INTRAVENOUS | Status: DC
  Administered 2023-05-09: 200 mg via INTRAVENOUS

## 2023-05-09 NOTE — Patient Instructions (Signed)

## 2023-05-11 ENCOUNTER — Encounter: Payer: Self-pay | Admitting: Podiatry

## 2023-05-11 ENCOUNTER — Ambulatory Visit: Admitting: Podiatry

## 2023-05-11 VITALS — Ht 59.0 in | Wt 130.2 lb

## 2023-05-11 DIAGNOSIS — M7741 Metatarsalgia, right foot: Secondary | ICD-10-CM | POA: Diagnosis not present

## 2023-05-11 DIAGNOSIS — M7742 Metatarsalgia, left foot: Secondary | ICD-10-CM

## 2023-05-11 DIAGNOSIS — L909 Atrophic disorder of skin, unspecified: Secondary | ICD-10-CM | POA: Diagnosis not present

## 2023-05-11 NOTE — Patient Instructions (Addendum)
 Look for [employer] for a good shoe to wear around the house on hard floor. [employer] in [place] should have them (also on [employer]) [employer] [address] [place], [place], [place] [address]

## 2023-05-12 ENCOUNTER — Inpatient Hospital Stay

## 2023-05-12 ENCOUNTER — Telehealth: Payer: Self-pay | Admitting: *Deleted

## 2023-05-12 NOTE — Telephone Encounter (Signed)
 She left message that her appt is  at 4 and she is running late and will be here at 4:20. I called the staff and they will not be able to do that late in day. I called pt and left message that she can't get iron today , it is too late . Then I asked megan to give her another appt to cover the one she can't have today

## 2023-05-14 NOTE — Progress Notes (Signed)
  Subjective:  Patient ID: Sheryl Perez, female    DOB: 01/29/1961,  MRN: [id number]  Chief Complaint  Patient presents with   Foot Pain    Pt is here for bilateral foot pain states the pain has been there since December, no injury to foot, states it's the bottom of both feet, pt has changed shoes thinking that would help the issue, but it didn't, thinks she may need some cushion insoles to help.    64 y.o. female presents with the above complaint. History confirmed with patient. Previous pain is better but just feels sore on the plantar forefoot during work and at end of day  Objective:  Physical Exam: warm, good capillary refill, no trophic changes or ulcerative lesions, normal DP and PT pulses, normal sensory exam, and diffuse forefoot pain, prominent metatarsal heads with fat pad atrophy Assessment:   1. Metatarsalgia of both feet   2. Fat pad atrophy of foot      Plan:  Patient was evaluated and treated and all questions answered.  Discussed metatarsalgia etiology and treatment options and fat pad atrophy. Unfortunately not many treatments to restore the fat pad exist. Orthotics and offloading support are our primary treatment option. Recommended she be fitted for custom molded accomodative foot orthoses and appt with Nicki Guadalajara will be scheduled for this   Return if symptoms worsen or fail to improve.

## 2023-05-16 ENCOUNTER — Inpatient Hospital Stay: Attending: Oncology

## 2023-05-16 VITALS — BP 142/51 | HR 70 | Temp 96.4°F | Resp 18

## 2023-05-16 DIAGNOSIS — G8929 Other chronic pain: Secondary | ICD-10-CM | POA: Insufficient documentation

## 2023-05-16 DIAGNOSIS — Z8507 Personal history of malignant neoplasm of pancreas: Secondary | ICD-10-CM | POA: Insufficient documentation

## 2023-05-16 DIAGNOSIS — Z823 Family history of stroke: Secondary | ICD-10-CM | POA: Insufficient documentation

## 2023-05-16 DIAGNOSIS — Z87891 Personal history of nicotine dependence: Secondary | ICD-10-CM | POA: Insufficient documentation

## 2023-05-16 DIAGNOSIS — Z9221 Personal history of antineoplastic chemotherapy: Secondary | ICD-10-CM | POA: Diagnosis not present

## 2023-05-16 DIAGNOSIS — D509 Iron deficiency anemia, unspecified: Secondary | ICD-10-CM | POA: Insufficient documentation

## 2023-05-16 DIAGNOSIS — Z8249 Family history of ischemic heart disease and other diseases of the circulatory system: Secondary | ICD-10-CM | POA: Insufficient documentation

## 2023-05-16 DIAGNOSIS — Z90710 Acquired absence of both cervix and uterus: Secondary | ICD-10-CM | POA: Insufficient documentation

## 2023-05-16 DIAGNOSIS — Z881 Allergy status to other antibiotic agents status: Secondary | ICD-10-CM | POA: Diagnosis not present

## 2023-05-16 DIAGNOSIS — Z8042 Family history of malignant neoplasm of prostate: Secondary | ICD-10-CM | POA: Insufficient documentation

## 2023-05-16 DIAGNOSIS — Z818 Family history of other mental and behavioral disorders: Secondary | ICD-10-CM | POA: Insufficient documentation

## 2023-05-16 DIAGNOSIS — Z806 Family history of leukemia: Secondary | ICD-10-CM | POA: Diagnosis not present

## 2023-05-16 DIAGNOSIS — Z885 Allergy status to narcotic agent status: Secondary | ICD-10-CM | POA: Insufficient documentation

## 2023-05-16 DIAGNOSIS — F1290 Cannabis use, unspecified, uncomplicated: Secondary | ICD-10-CM | POA: Insufficient documentation

## 2023-05-16 DIAGNOSIS — Z9049 Acquired absence of other specified parts of digestive tract: Secondary | ICD-10-CM | POA: Diagnosis not present

## 2023-05-16 DIAGNOSIS — Z888 Allergy status to other drugs, medicaments and biological substances status: Secondary | ICD-10-CM | POA: Insufficient documentation

## 2023-05-16 DIAGNOSIS — Z882 Allergy status to sulfonamides status: Secondary | ICD-10-CM | POA: Diagnosis not present

## 2023-05-16 DIAGNOSIS — E860 Dehydration: Secondary | ICD-10-CM

## 2023-05-16 DIAGNOSIS — F319 Bipolar disorder, unspecified: Secondary | ICD-10-CM | POA: Insufficient documentation

## 2023-05-16 DIAGNOSIS — Z803 Family history of malignant neoplasm of breast: Secondary | ICD-10-CM | POA: Diagnosis not present

## 2023-05-16 DIAGNOSIS — Z808 Family history of malignant neoplasm of other organs or systems: Secondary | ICD-10-CM | POA: Diagnosis not present

## 2023-05-16 DIAGNOSIS — Z79899 Other long term (current) drug therapy: Secondary | ICD-10-CM | POA: Insufficient documentation

## 2023-05-16 DIAGNOSIS — Z833 Family history of diabetes mellitus: Secondary | ICD-10-CM | POA: Diagnosis not present

## 2023-05-16 DIAGNOSIS — Z87440 Personal history of urinary (tract) infections: Secondary | ICD-10-CM | POA: Diagnosis not present

## 2023-05-16 MED ORDER — IRON SUCROSE 20 MG/ML IV SOLN
200.0000 mg | INTRAVENOUS | Status: DC
  Administered 2023-05-16: 200 mg via INTRAVENOUS
  Filled 2023-05-16: qty 10

## 2023-05-16 MED ORDER — SODIUM CHLORIDE 0.9% FLUSH
10.0000 mL | Freq: Once | INTRAVENOUS | Status: AC | PRN
  Administered 2023-05-16: 10 mL
  Filled 2023-05-16: qty 10

## 2023-05-18 ENCOUNTER — Inpatient Hospital Stay

## 2023-05-18 VITALS — BP 124/55 | HR 81 | Temp 98.6°F | Resp 18

## 2023-05-18 DIAGNOSIS — Z8507 Personal history of malignant neoplasm of pancreas: Secondary | ICD-10-CM | POA: Diagnosis not present

## 2023-05-18 DIAGNOSIS — E860 Dehydration: Secondary | ICD-10-CM

## 2023-05-18 MED ORDER — IRON SUCROSE 20 MG/ML IV SOLN
200.0000 mg | INTRAVENOUS | Status: DC
  Administered 2023-05-18: 200 mg via INTRAVENOUS

## 2023-05-18 MED ORDER — SODIUM CHLORIDE 0.9% FLUSH
10.0000 mL | Freq: Once | INTRAVENOUS | Status: AC | PRN
  Administered 2023-05-18: 10 mL
  Filled 2023-05-18: qty 10

## 2023-05-20 ENCOUNTER — Inpatient Hospital Stay (HOSPITAL_BASED_OUTPATIENT_CLINIC_OR_DEPARTMENT_OTHER): Payer: BC Managed Care – PPO | Admitting: Oncology

## 2023-05-20 ENCOUNTER — Inpatient Hospital Stay: Payer: BC Managed Care – PPO

## 2023-05-20 ENCOUNTER — Encounter: Payer: Self-pay | Admitting: Oncology

## 2023-05-20 VITALS — BP 123/57 | HR 73 | Temp 96.8°F | Resp 16 | Wt 136.6 lb

## 2023-05-20 DIAGNOSIS — D508 Other iron deficiency anemias: Secondary | ICD-10-CM

## 2023-05-20 DIAGNOSIS — Z08 Encounter for follow-up examination after completed treatment for malignant neoplasm: Secondary | ICD-10-CM

## 2023-05-20 DIAGNOSIS — R5383 Other fatigue: Secondary | ICD-10-CM

## 2023-05-20 DIAGNOSIS — Z8507 Personal history of malignant neoplasm of pancreas: Secondary | ICD-10-CM | POA: Diagnosis not present

## 2023-05-20 DIAGNOSIS — D649 Anemia, unspecified: Secondary | ICD-10-CM

## 2023-05-20 DIAGNOSIS — C259 Malignant neoplasm of pancreas, unspecified: Secondary | ICD-10-CM

## 2023-05-20 LAB — CBC WITH DIFFERENTIAL (CANCER CENTER ONLY)
Abs Immature Granulocytes: 0.01 10*3/uL (ref 0.00–0.07)
Basophils Absolute: 0 10*3/uL (ref 0.0–0.1)
Basophils Relative: 1 %
Eosinophils Absolute: 0.6 10*3/uL — ABNORMAL HIGH (ref 0.0–0.5)
Eosinophils Relative: 13 %
HCT: 33.2 % — ABNORMAL LOW (ref 36.0–46.0)
Hemoglobin: 10.5 g/dL — ABNORMAL LOW (ref 12.0–15.0)
Immature Granulocytes: 0 %
Lymphocytes Relative: 29 %
Lymphs Abs: 1.4 10*3/uL (ref 0.7–4.0)
MCH: 33.2 pg (ref 26.0–34.0)
MCHC: 31.6 g/dL (ref 30.0–36.0)
MCV: 105.1 fL — ABNORMAL HIGH (ref 80.0–100.0)
Monocytes Absolute: 0.5 10*3/uL (ref 0.1–1.0)
Monocytes Relative: 11 %
Neutro Abs: 2.1 10*3/uL (ref 1.7–7.7)
Neutrophils Relative %: 46 %
Platelet Count: 238 10*3/uL (ref 150–400)
RBC: 3.16 MIL/uL — ABNORMAL LOW (ref 3.87–5.11)
RDW: 13.9 % (ref 11.5–15.5)
WBC Count: 4.7 10*3/uL (ref 4.0–10.5)
nRBC: 0 % (ref 0.0–0.2)

## 2023-05-20 LAB — CMP (CANCER CENTER ONLY)
ALT: 12 U/L (ref 0–44)
AST: 18 U/L (ref 15–41)
Albumin: 3.6 g/dL (ref 3.5–5.0)
Alkaline Phosphatase: 90 U/L (ref 38–126)
Anion gap: 6 (ref 5–15)
BUN: 16 mg/dL (ref 8–23)
CO2: 23 mmol/L (ref 22–32)
Calcium: 8.6 mg/dL — ABNORMAL LOW (ref 8.9–10.3)
Chloride: 108 mmol/L (ref 98–111)
Creatinine: 0.83 mg/dL (ref 0.44–1.00)
GFR, Estimated: >60 mL/min (ref >60)
Glucose, Bld: 99 mg/dL (ref 70–99)
Potassium: 3.9 mmol/L (ref 3.5–5.1)
Sodium: 137 mmol/L (ref 135–145)
Total Bilirubin: 0.4 mg/dL (ref 0.0–1.2)
Total Protein: 6.9 g/dL (ref 6.5–8.1)

## 2023-05-20 LAB — VITAMIN B12: Vitamin B-12: 325 pg/mL (ref 180–914)

## 2023-05-20 NOTE — Progress Notes (Signed)
 Patient denies new or acute problems/concerns today.

## 2023-05-21 ENCOUNTER — Encounter: Payer: Self-pay | Admitting: Oncology

## 2023-05-21 LAB — CANCER ANTIGEN 19-9: CA 19-9: 29 U/mL (ref 0–35)

## 2023-05-21 NOTE — Progress Notes (Signed)
 Hematology/Oncology Consult note [facility]  [phone] [phone]  Patient Care Team: Theron Flavin, MD as PCP - General (Internal Medicine) Kip Peon, DC as Referring Physician (Chiropractic Medicine) Jerlean Mood, MD (General Surgery) Avonne Boettcher, MD as Consulting Physician (Hematology and Oncology)   Name of the patient: British Moyd  [id number]  August 14, 1960   Date of visit: 05/21/23  Diagnosis- pancreatic adenocarcinoma stage II apT2 N1 M0 currently in remission   Chief complaint/ Reason for visit-routine surveillance visit for pancreatic cancer and iron deficiency anemia  Heme/Onc history: Patient is a 63 year old female diagnosed with T2 N0 M0 pancreatic adenocarcinoma in June 2022.  She received 3 months of neoadjuvant modified FOLFIRINOX chemotherapy and underwent Whipple surgery at [facility] in December 2022.  Final pathology showed 2.3 cm grade 2 moderately differentiated adenocarcinoma with lymphovascular and perineural invasion.  Treatment effect present with residual cancer showing evident tumor regression but more than single cells or rare small group of cancer cells.  Partial response.  Margins negative.  1 out of 22 lymph nodes positive for malignancy.  She went on to complete 3 more months of adjuvant FOLFIRINOX chemotherapy in March 2023.  She is presently in remission.  She also has history of iron deficiency anemia for which she receives IV iron.  Last colonoscopy and endoscopy in July 2020 was unremarkable  Interval history-patient reports improvement in her energy levels after receiving IV iron  ECOG PS- 1 Pain scale- 0   Review of systems- Review of Systems  Constitutional:  Negative for chills, fever, malaise/fatigue and weight loss.  HENT:  Negative for congestion, ear discharge and nosebleeds.   Eyes:  Negative for blurred vision.  Respiratory:  Negative for cough, hemoptysis, sputum production,  shortness of breath and wheezing.   Cardiovascular:  Negative for chest pain, palpitations, orthopnea and claudication.  Gastrointestinal:  Negative for abdominal pain, blood in stool, constipation, diarrhea, heartburn, melena, nausea and vomiting.  Genitourinary:  Negative for dysuria, flank pain, frequency, hematuria and urgency.  Musculoskeletal:  Negative for back pain, joint pain and myalgias.  Skin:  Negative for rash.  Neurological:  Negative for dizziness, tingling, focal weakness, seizures, weakness and headaches.  Endo/Heme/Allergies:  Does not bruise/bleed easily.  Psychiatric/Behavioral:  Negative for depression and suicidal ideas. The patient does not have insomnia.       Allergies  Allergen Reactions   Sulfacetamide Sodium Swelling    Diarrhea,dizziness, tongue swelling   Other Other (See Comments) and Swelling    PT states that anything with the sides effects "may cause rash" she cannot take due to her psoriasis, it will make the patches on her hand so thick she can't use her hands.  Diarrhea,dizziness, tongue swelling   Oxycontin [Oxycodone Hcl] Nausea And Vomiting   Sulfa Antibiotics Swelling    Diarrhea,dizziness, tongue swelling   Abilify [Aripiprazole] Other (See Comments)   Cephalosporins Rash   Erythromycin Other (See Comments)    Abd. pain Abd. pain Other reaction(s): Other (See Comments) Abd. 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    Bipolar disorder (HCC)    Chest pain, unspecified    Chronic low back pain 10/23/2013   Chronic UTI  COPD (chronic obstructive pulmonary disease) (HCC)    Depression    Dysfunction of eustachian tube    Enlargement of lymph nodes    Family history of brain cancer    Family history of breast cancer     Family history of prostate cancer    History of Bell's palsy Right   Injury, other and unspecified, knee, leg, ankle, and foot    MRSA (methicillin resistant staph aureus) culture positive 07/11/2014   abscess   Other specified disease of hair and hair follicles    Pancreatic cancer (HCC)    Pancreatic cancer (HCC)    Rectal prolapse    Unspecified symptom associated with female genital organs      Past Surgical History:  Procedure Laterality Date   ABDOMINAL HYSTERECTOMY     bladder tack  2010   CHOLECYSTECTOMY  2002   FOOT SURGERY Bilateral 2004   bunion   GANGLION CYST EXCISION  2002   INCISION AND DRAINAGE ABSCESS  04-16-15   PARTIAL HYSTERECTOMY  2010   uterus removed   PORTA CATH INSERTION N/A 09/11/2020   Procedure: PORTA CATH INSERTION;  Surgeon: Celso College, MD;  Location: [facility] INVASIVE CV LAB;  Service: Cardiovascular;  Laterality: N/A;   PORTA CATH REMOVAL N/A 04/26/2022   Procedure: PORTA CATH REMOVAL;  Surgeon: Celso College, MD;  Location: [facility] INVASIVE CV LAB;  Service: Cardiovascular;  Laterality: N/A;    Social History   Socioeconomic History   Marital status: Married    Spouse name: Not on file   Number of children: 3   Years of education: hs   Highest education level: Some college, no degree  Occupational History   Not on file  Tobacco Use   Smoking status: Former    Current packs/day: 0.00    Average packs/day: 0.5 packs/day for 40.0 years (20.0 ttl pk-yrs)    Types: Cigarettes    Start date: 07/14/1980    Quit date: 07/14/2020    Years since quitting: 2.8   Smokeless tobacco: Never  Vaping Use   Vaping status: Never Used  Substance and Sexual Activity   Alcohol use: No    Alcohol/week: 1.0 standard drink of alcohol    Types: 1 Glasses of wine per week   Drug use: Yes    Frequency: 7.0 times per week    Types: Marijuana   Sexual activity: Yes    Birth control/protection: None  Other Topics Concern   Not on file  Social History Narrative    Patient lives at home with her husband Debria Fang).   Patient works full time.   Education CNA    Right handed.   Caffeine Tea , Soda.   Social Drivers of Corporate investment banker Strain: Low Risk  (01/23/2021)   Received from [facility], [facility]   Overall Financial Resource Strain (CARDIA)    Difficulty of Paying Living Expenses: Not hard at all  Food Insecurity: No Food Insecurity (01/23/2021)   Received from [facility], [facility]   Hunger Vital Sign    Worried About Running Out of Food in the Last Year: Never true    Ran Out of Food in the Last Year: Never true  Transportation Needs: No Transportation Needs (01/23/2021)   Received from [facility], [facility]   [facility] - Transportation    In the past 12 months, has lack of transportation kept you from  medical appointments or from getting medications?: No    Lack of Transportation (Non-Medical): No  Physical Activity: Inactive (01/10/2017)   Exercise Vital Sign    Days of Exercise per Week: 0 days    Minutes of Exercise per Session: 0 min  Stress: Stress Concern Present (01/10/2017)   [employer] of Occupational Health - Occupational Stress Questionnaire    Feeling of Stress : Very much  Social Connections: Somewhat Isolated (01/10/2017)   Social Connection and Isolation Panel [NHANES]    Frequency of Communication with Friends and Family: More than three times a week    Frequency of Social Gatherings with Friends and Family: More than three times a week    Attends Religious Services: More than 4 times per year    Active Member of [employer] or Organizations: No    Attends Banker Meetings: Never    Marital Status: Separated  Intimate Partner Violence: Not At Risk (01/10/2017)   Humiliation, Afraid, Rape, and Kick questionnaire    Fear of Current or Ex-Partner: No    Emotionally Abused: No     Physically Abused: No    Sexually Abused: No    Family History  Problem Relation Age of Onset   Diabetes Father    Cancer Father        brain tumor   Depression Father    Depression Sister    Alcohol abuse Brother    Depression Brother    Leukemia Maternal Aunt    Prostate cancer Maternal Uncle    Breast cancer Paternal Aunt        dx >50   Heart disease Maternal Grandmother    Heart attack Maternal Grandmother    Stroke Maternal Grandmother    Parkinson's disease Maternal Grandfather    Leukemia Paternal Grandmother    Diabetes Paternal Grandfather    Breast cancer Cousin    Brain cancer Niece        dx under 50     Current Outpatient Medications:    albuterol (VENTOLIN HFA) 108 (90 Base) MCG/ACT inhaler, TAKE 2 PUFFS BY MOUTH EVERY 6 HOURS AS NEEDED FOR WHEEZE OR SHORTNESS OF BREATH, Disp: 8 each, Rfl: 4   calcium-vitamin D (OSCAL WITH D) 500-5 MG-MCG tablet, Take 1 tablet by mouth daily., Disp: , Rfl:    clobetasol cream (TEMOVATE) 0.05 %, Apply 1 Application topically 2 (two) times daily., Disp: 60 g, Rfl: 2   divalproex (DEPAKOTE) 250 MG DR tablet, Take 1 tablet (250 mg total) by mouth daily., Disp: 90 tablet, Rfl: 1   famotidine (PEPCID) 20 MG tablet, TAKE 1 TABLET BY MOUTH TWICE A DAY, Disp: 180 tablet, Rfl: 3   famotidine (PEPCID) 20 MG tablet, Take 1 tablet (20 mg total) by mouth 2 (two) times daily., Disp: 180 tablet, Rfl: 3   KLOR-CON M20 20 MEQ tablet, TAKE 1 TABLET BY MOUTH EVERY DAY, Disp: 90 tablet, Rfl: 1   loperamide (IMODIUM A-D) 2 MG tablet, Take 2 at onset of diarrhea, then 1 every 2hrs until 12hr without a BM. May take 2 tab every 4hrs at bedtime. If diarrhea recurs repeat., Disp: 100 tablet, Rfl: 1   loratadine (CLARITIN) 10 MG tablet, TAKE 1 TABLET BY MOUTH EVERY DAY, Disp: 90 tablet, Rfl: 1   lurasidone (LATUDA) 40 MG TABS tablet, Take 40 mg by mouth daily with breakfast., Disp: , Rfl:  No current facility-administered medications for this  visit.  Facility-Administered Medications Ordered in Other Visits:    heparin lock  flush 100 UNIT/ML injection, , , ,    heparin lock flush 100 UNIT/ML injection, , , ,    heparin lock flush 100 unit/mL, 500 Units, Intravenous, Once, Chick Cousins C, MD   heparin lock flush 100 unit/mL, 500 Units, Intravenous, Once, Avonne Boettcher, MD   sodium chloride flush (NS) 0.9 % injection 10 mL, 10 mL, Intracatheter, PRN, Avonne Boettcher, MD, 10 mL at 11/03/20 1441   sodium chloride flush (NS) 0.9 % injection 10 mL, 10 mL, Intravenous, PRN, Avonne Boettcher, MD  Physical exam:  Vitals:   05/20/23 1526  BP: (!) 123/57  Pulse: 73  Resp: 16  Temp: (!) 96.8 F (36 C)  TempSrc: Tympanic  Weight: 136 lb 9.6 oz (62 kg)   Physical Exam Cardiovascular:     Rate and Rhythm: Normal rate and regular rhythm.     Heart sounds: Normal heart sounds.  Pulmonary:     Effort: Pulmonary effort is normal.     Breath sounds: Normal breath sounds.  Abdominal:     General: Bowel sounds are normal.     Palpations: Abdomen is soft.  Skin:    General: Skin is warm and dry.  Neurological:     Mental Status: She is alert and oriented to person, place, and time.      I have personally reviewed labs listed below:    Latest Ref Rng & Units 05/20/2023    3:02 PM  CMP  Glucose 70 - 99 mg/dL 99   BUN 8 - 23 mg/dL 16   Creatinine 0.98 - 1.00 mg/dL 1.19   Sodium 147 - 829 mmol/L 137   Potassium 3.5 - 5.1 mmol/L 3.9   Chloride 98 - 111 mmol/L 108   CO2 22 - 32 mmol/L 23   Calcium 8.9 - 10.3 mg/dL 8.6   Total Protein 6.5 - 8.1 g/dL 6.9   Total Bilirubin 0.0 - 1.2 mg/dL 0.4   Alkaline Phos 38 - 126 U/L 90   AST 15 - 41 U/L 18   ALT 0 - 44 U/L 12       Latest Ref Rng & Units 05/20/2023    3:02 PM  CBC  WBC 4.0 - 10.5 K/uL 4.7   Hemoglobin 12.0 - 15.0 g/dL 56.2   Hematocrit 13.0 - 46.0 % 33.2   Platelets 150 - 400 K/uL 238      Assessment and plan- Patient is a 62 y.o. female here for routine follow up For  follow issues:  Surveillance pancreatic cancer: Patient has been getting surveillance CT [facility] at [facility] and her last CT scan from December 2024 showed no evidence of recurrent disease.  She has repeat scans scheduled with them in June 2025.  CA 19-9 is presently within normal limits.  History of iron deficiency anemia: She received 5 doses of Venofer with the last dose that was given 2 days ago.  I am therefore not repeating iron studies at this time.  I will repeat CBC ferritin and iron studies in 3 months and see her thereafter   Visit Diagnosis 1. Other iron deficiency anemia   2. Encounter for follow-up surveillance of pancreatic cancer      Dr. Seretha Dance, MD, MPH [facility] [phone] 05/21/2023 2:19 PM

## 2023-07-13 ENCOUNTER — Other Ambulatory Visit: Payer: Self-pay | Admitting: Oncology

## 2023-08-16 ENCOUNTER — Inpatient Hospital Stay (HOSPITAL_BASED_OUTPATIENT_CLINIC_OR_DEPARTMENT_OTHER): Admitting: Oncology

## 2023-08-16 ENCOUNTER — Inpatient Hospital Stay: Attending: Oncology

## 2023-08-16 ENCOUNTER — Encounter: Payer: Self-pay | Admitting: Oncology

## 2023-08-16 VITALS — BP 109/34 | HR 79 | Temp 98.6°F | Resp 20 | Wt 125.5 lb

## 2023-08-16 DIAGNOSIS — Z881 Allergy status to other antibiotic agents status: Secondary | ICD-10-CM | POA: Insufficient documentation

## 2023-08-16 DIAGNOSIS — Z885 Allergy status to narcotic agent status: Secondary | ICD-10-CM | POA: Insufficient documentation

## 2023-08-16 DIAGNOSIS — Z87891 Personal history of nicotine dependence: Secondary | ICD-10-CM | POA: Diagnosis not present

## 2023-08-16 DIAGNOSIS — Z90710 Acquired absence of both cervix and uterus: Secondary | ICD-10-CM | POA: Insufficient documentation

## 2023-08-16 DIAGNOSIS — Z818 Family history of other mental and behavioral disorders: Secondary | ICD-10-CM | POA: Diagnosis not present

## 2023-08-16 DIAGNOSIS — Z08 Encounter for follow-up examination after completed treatment for malignant neoplasm: Secondary | ICD-10-CM | POA: Diagnosis not present

## 2023-08-16 DIAGNOSIS — Z833 Family history of diabetes mellitus: Secondary | ICD-10-CM | POA: Diagnosis not present

## 2023-08-16 DIAGNOSIS — Z8249 Family history of ischemic heart disease and other diseases of the circulatory system: Secondary | ICD-10-CM | POA: Insufficient documentation

## 2023-08-16 DIAGNOSIS — Z808 Family history of malignant neoplasm of other organs or systems: Secondary | ICD-10-CM | POA: Insufficient documentation

## 2023-08-16 DIAGNOSIS — Z8042 Family history of malignant neoplasm of prostate: Secondary | ICD-10-CM | POA: Insufficient documentation

## 2023-08-16 DIAGNOSIS — Z87440 Personal history of urinary (tract) infections: Secondary | ICD-10-CM | POA: Insufficient documentation

## 2023-08-16 DIAGNOSIS — Z9221 Personal history of antineoplastic chemotherapy: Secondary | ICD-10-CM | POA: Diagnosis not present

## 2023-08-16 DIAGNOSIS — Z882 Allergy status to sulfonamides status: Secondary | ICD-10-CM | POA: Diagnosis not present

## 2023-08-16 DIAGNOSIS — Z9049 Acquired absence of other specified parts of digestive tract: Secondary | ICD-10-CM | POA: Insufficient documentation

## 2023-08-16 DIAGNOSIS — Z79899 Other long term (current) drug therapy: Secondary | ICD-10-CM | POA: Diagnosis not present

## 2023-08-16 DIAGNOSIS — Z888 Allergy status to other drugs, medicaments and biological substances status: Secondary | ICD-10-CM | POA: Insufficient documentation

## 2023-08-16 DIAGNOSIS — F319 Bipolar disorder, unspecified: Secondary | ICD-10-CM | POA: Diagnosis not present

## 2023-08-16 DIAGNOSIS — Z823 Family history of stroke: Secondary | ICD-10-CM | POA: Diagnosis not present

## 2023-08-16 DIAGNOSIS — Z803 Family history of malignant neoplasm of breast: Secondary | ICD-10-CM | POA: Insufficient documentation

## 2023-08-16 DIAGNOSIS — F1290 Cannabis use, unspecified, uncomplicated: Secondary | ICD-10-CM | POA: Insufficient documentation

## 2023-08-16 DIAGNOSIS — Z806 Family history of leukemia: Secondary | ICD-10-CM | POA: Diagnosis not present

## 2023-08-16 DIAGNOSIS — D508 Other iron deficiency anemias: Secondary | ICD-10-CM

## 2023-08-16 DIAGNOSIS — Z8507 Personal history of malignant neoplasm of pancreas: Secondary | ICD-10-CM | POA: Insufficient documentation

## 2023-08-16 LAB — IRON AND TIBC
Iron: 101 ug/dL (ref 28–170)
Saturation Ratios: 29 % (ref 10.4–31.8)
TIBC: 354 ug/dL (ref 250–450)
UIBC: 253 ug/dL

## 2023-08-16 LAB — CBC WITH DIFFERENTIAL (CANCER CENTER ONLY)
Abs Immature Granulocytes: 0.01 K/uL (ref 0.00–0.07)
Basophils Absolute: 0 K/uL (ref 0.0–0.1)
Basophils Relative: 1 %
Eosinophils Absolute: 0.2 K/uL (ref 0.0–0.5)
Eosinophils Relative: 4 %
HCT: 34.2 % — ABNORMAL LOW (ref 36.0–46.0)
Hemoglobin: 11.3 g/dL — ABNORMAL LOW (ref 12.0–15.0)
Immature Granulocytes: 0 %
Lymphocytes Relative: 34 %
Lymphs Abs: 1.3 K/uL (ref 0.7–4.0)
MCH: 33.1 pg (ref 26.0–34.0)
MCHC: 33 g/dL (ref 30.0–36.0)
MCV: 100.3 fL — ABNORMAL HIGH (ref 80.0–100.0)
Monocytes Absolute: 0.4 K/uL (ref 0.1–1.0)
Monocytes Relative: 9 %
Neutro Abs: 2.1 K/uL (ref 1.7–7.7)
Neutrophils Relative %: 52 %
Platelet Count: 192 K/uL (ref 150–400)
RBC: 3.41 MIL/uL — ABNORMAL LOW (ref 3.87–5.11)
RDW: 12.4 % (ref 11.5–15.5)
WBC Count: 4 K/uL (ref 4.0–10.5)
nRBC: 0 % (ref 0.0–0.2)

## 2023-08-16 LAB — FERRITIN: Ferritin: 190 ng/mL (ref 11–307)

## 2023-08-16 NOTE — Progress Notes (Signed)
 Hematology/Oncology Consult note [facility]  [phone] [phone]  Patient Care Team: Britta King, MD as PCP - General (Internal Medicine) Malvina Eck, DC as Referring Physician (Chiropractic Medicine) Dellie Louanne MATSU, MD (General Surgery) Melanee Annah BROCKS, MD as Consulting Physician (Hematology and Oncology)   Name of the patient: Sheryl Perez  [id number]  1960-10-18   Date of visit: 08/16/23  Diagnosis- pancreatic adenocarcinoma stage II apT2 N1 M0 currently in remission     Chief complaint/ Reason for visit-routine follow-up of pancreatic cancer presently in remission  Heme/Onc history: Patient is a 63 year old female diagnosed with T2 N0 M0 pancreatic adenocarcinoma in June 2022.  She received 3 months of neoadjuvant modified FOLFIRINOX chemotherapy and underwent Whipple surgery at [facility] in December 2022.  Final pathology showed 2.3 cm grade 2 moderately differentiated adenocarcinoma with lymphovascular and perineural invasion.  Treatment effect present with residual cancer showing evident tumor regression but more than single cells or rare small group of cancer cells.  Partial response.  Margins negative.  1 out of 22 lymph nodes positive for malignancy.  She went on to complete 3 more months of adjuvant FOLFIRINOX chemotherapy in March 2023.  She is presently in remission.   She also has history of iron  deficiency anemia for which she receives IV iron .  Last colonoscopy and endoscopy in July 2020 was unremarkable    Interval history-patient is here with her daughter today.  Overall she is feeling well and denies any symptoms of abdominal pain nausea vomiting or diarrhea.  Her weight has been fluctuating and her daughter tells me that she usually loses weight when she is depressed.  She is establishing care with psychiatry soon at [facility].  Denies any suicidal or homicidal ideations  ECOG PS- 1 Pain scale- 0   Review of  systems- Review of Systems  Constitutional:  Negative for chills, fever, malaise/fatigue and weight loss.  HENT:  Negative for congestion, ear discharge and nosebleeds.   Eyes:  Negative for blurred vision.  Respiratory:  Negative for cough, hemoptysis, sputum production, shortness of breath and wheezing.   Cardiovascular:  Negative for chest pain, palpitations, orthopnea and claudication.  Gastrointestinal:  Negative for abdominal pain, blood in stool, constipation, diarrhea, heartburn, melena, nausea and vomiting.  Genitourinary:  Negative for dysuria, flank pain, frequency, hematuria and urgency.  Musculoskeletal:  Negative for back pain, joint pain and myalgias.  Skin:  Negative for rash.  Neurological:  Negative for dizziness, tingling, focal weakness, seizures, weakness and headaches.  Endo/Heme/Allergies:  Does not bruise/bleed easily.  Psychiatric/Behavioral:  Negative for depression and suicidal ideas. The patient does not have insomnia.       Allergies  Allergen Reactions   Sulfacetamide Sodium Swelling    Diarrhea,dizziness, tongue swelling   Other Other (See Comments) and Swelling    PT states that anything with the sides effects may cause rash she cannot take due to her psoriasis, it will make the patches on her hand so thick she can't use her hands.  Diarrhea,dizziness, tongue swelling   Oxycontin [Oxycodone Hcl] Nausea And Vomiting   Sulfa Antibiotics Swelling    Diarrhea,dizziness, tongue swelling   Abilify [Aripiprazole] Other (See Comments)   Cephalosporins Rash   Erythromycin Other (See Comments)    Abd. pain Abd. pain Other reaction(s): Other (See Comments) Abd. 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    Bipolar disorder (HCC)    Chest  pain, unspecified    Chronic low back pain 10/23/2013   Chronic UTI    COPD (chronic obstructive pulmonary disease) (HCC)    Depression    Dysfunction of eustachian tube    Enlargement of lymph nodes    Family history of brain cancer    Family history of breast cancer    Family history of prostate cancer    History of Bell's palsy Right   Injury, other and unspecified, knee, leg, ankle, and foot    MRSA (methicillin resistant staph aureus) culture positive 07/11/2014   abscess   Other specified disease of hair and hair follicles    Pancreatic cancer (HCC)    Pancreatic cancer (HCC)    Rectal prolapse    Unspecified symptom associated with female genital organs      Past Surgical History:  Procedure Laterality Date   ABDOMINAL HYSTERECTOMY     bladder tack  2010   CHOLECYSTECTOMY  2002   FOOT SURGERY Bilateral 2004   bunion   GANGLION CYST EXCISION  2002   INCISION AND DRAINAGE ABSCESS  04-16-15   PARTIAL HYSTERECTOMY  2010   uterus removed   PORTA CATH INSERTION N/A 09/11/2020   Procedure: PORTA CATH INSERTION;  Surgeon: Marea Selinda RAMAN, MD;  Location: [facility] INVASIVE CV LAB;  Service: Cardiovascular;  Laterality: N/A;   PORTA CATH REMOVAL N/A 04/26/2022   Procedure: PORTA CATH REMOVAL;  Surgeon: Marea Selinda RAMAN, MD;  Location: [facility] INVASIVE CV LAB;  Service: Cardiovascular;  Laterality: N/A;    Social History   Socioeconomic History   Marital status: Married    Spouse name: Not on file   Number of children: 3   Years of education: hs   Highest education level: Some college, no degree  Occupational History   Not on file  Tobacco Use   Smoking status: Former    Current packs/day: 0.00    Average packs/day: 0.5 packs/day for 40.0 years (20.0 ttl pk-yrs)    Types: Cigarettes    Start date: 07/14/1980    Quit date: 07/14/2020    Years since quitting: 3.0   Smokeless tobacco: Never  Vaping Use   Vaping status: Never Used  Substance and Sexual Activity   Alcohol use: No     Alcohol/week: 1.0 standard drink of alcohol    Types: 1 Glasses of wine per week   Drug use: Yes    Frequency: 7.0 times per week    Types: Marijuana   Sexual activity: Yes    Birth control/protection: None  Other Topics Concern   Not on file  Social History Narrative   Patient lives at home with her husband Kristan).   Patient works full time.   Education CNA    Right handed.   Caffeine Tea , Soda.   Social Drivers of Corporate investment banker Strain: Low Risk  (01/23/2021)   Received from [facility]   Overall Financial Resource Strain (CARDIA)    Difficulty of Paying Living Expenses: Not hard at all  Food Insecurity: No Food Insecurity (01/23/2021)   Received from [facility]   Hunger Vital Sign    Within the past 12 months, you worried that your food would run out before you got the money to  buy more.: Never true    Within the past 12 months, the food you bought just didn't last and you didn't have money to get more.: Never true  Transportation Needs: No Transportation Needs (01/23/2021)   Received [facility] - Transportation    In the past 12 months, has lack of transportation kept you from medical appointments or from getting medications?: No    Lack of Transportation (Non-Medical): No  Physical Activity: Inactive (01/10/2017)   Exercise Vital Sign    Days of Exercise per Week: 0 days    Minutes of Exercise per Session: 0 min  Stress: Stress Concern Present (01/10/2017)   [employer] of Occupational Health - Occupational Stress Questionnaire    Feeling of Stress : Very much  Social Connections: Somewhat Isolated (01/10/2017)   Social Connection and Isolation Panel    Frequency of Communication with Friends and Family: More than three times a week    Frequency of Social Gatherings with Friends and Family: More than three times a week    Attends Religious Services: More than 4 times per year     Active Member of [employer] or Organizations: No    Attends Banker Meetings: Never    Marital Status: Separated  Intimate Partner Violence: Not At Risk (01/10/2017)   Humiliation, Afraid, Rape, and Kick questionnaire    Fear of Current or Ex-Partner: No    Emotionally Abused: No    Physically Abused: No    Sexually Abused: No    Family History  Problem Relation Age of Onset   Diabetes Father    Cancer Father        brain tumor   Depression Father    Depression Sister    Alcohol abuse Brother    Depression Brother    Leukemia Maternal Aunt    Prostate cancer Maternal Uncle    Breast cancer Paternal Aunt        dx >50   Heart disease Maternal Grandmother    Heart attack Maternal Grandmother    Stroke Maternal Grandmother    Parkinson's disease Maternal Grandfather    Leukemia Paternal Grandmother    Diabetes Paternal Grandfather    Breast cancer Cousin    Brain cancer Niece        dx under 78     Current Outpatient Medications:    albuterol  (VENTOLIN  HFA) 108 (90 Base) MCG/ACT inhaler, TAKE 2 PUFFS BY MOUTH EVERY 6 HOURS AS NEEDED FOR WHEEZE OR SHORTNESS OF BREATH, Disp: 8 each, Rfl: 4   calcium -vitamin D (OSCAL WITH D) 500-5 MG-MCG tablet, Take 1 tablet by mouth daily., Disp: , Rfl:    clobetasol  cream (TEMOVATE ) 0.05 %, Apply 1 Application topically 2 (two) times daily., Disp: 60 g, Rfl: 2   divalproex  (DEPAKOTE ) 250 MG DR tablet, Take 1 tablet (250 mg total) by mouth daily., Disp: 90 tablet, Rfl: 1   famotidine  (PEPCID ) 20 MG tablet, TAKE 1 TABLET BY MOUTH TWICE A DAY, Disp: 180 tablet, Rfl: 3   famotidine  (PEPCID ) 20 MG tablet, Take 1 tablet (20 mg total) by mouth 2 (two) times daily., Disp: 180 tablet, Rfl: 3   loperamide  (IMODIUM  A-D) 2 MG tablet, Take 2 at onset of diarrhea, then 1 every 2hrs until 12hr without a BM. May take 2 tab every 4hrs at bedtime. If diarrhea recurs repeat., Disp: 100 tablet, Rfl: 1   loratadine  (CLARITIN ) 10 MG tablet, TAKE 1 TABLET BY  MOUTH EVERY DAY,  Disp: 90 tablet, Rfl: 1   lurasidone (LATUDA) 40 MG TABS tablet, Take 40 mg by mouth daily with breakfast., Disp: , Rfl:    potassium chloride  SA (KLOR-CON  M) 20 MEQ tablet, TAKE 1 TABLET BY MOUTH EVERY DAY, Disp: 90 tablet, Rfl: 1 No current facility-administered medications for this visit.  Facility-Administered Medications Ordered in Other Visits:    heparin  lock flush 100 UNIT/ML injection, , , ,    heparin  lock flush 100 UNIT/ML injection, , , ,    heparin  lock flush 100 unit/mL, 500 Units, Intravenous, Once, Melanee Annah BROCKS, MD   heparin  lock flush 100 unit/mL, 500 Units, Intravenous, Once, Melanee Annah BROCKS, MD   sodium chloride  flush (NS) 0.9 % injection 10 mL, 10 mL, Intracatheter, PRN, Melanee Annah BROCKS, MD, 10 mL at 11/03/20 1441   sodium chloride  flush (NS) 0.9 % injection 10 mL, 10 mL, Intravenous, PRN, Melanee Annah BROCKS, MD  Physical exam:  Vitals:   08/16/23 1456  BP: (!) 109/34  Pulse: 79  Resp: 20  Temp: 98.6 F (37 C)  SpO2: 100%  Weight: 125 lb 8 oz (56.9 kg)   Physical Exam Cardiovascular:     Rate and Rhythm: Normal rate and regular rhythm.     Heart sounds: Normal heart sounds.  Pulmonary:     Effort: Pulmonary effort is normal.     Breath sounds: Normal breath sounds.  Abdominal:     General: Bowel sounds are normal.     Palpations: Abdomen is soft.  Skin:    General: Skin is warm and dry.  Neurological:     Mental Status: She is alert and oriented to person, place, and time.      I have personally reviewed labs listed below:    Latest Ref Rng & Units 05/20/2023    3:02 PM  CMP  Glucose 70 - 99 mg/dL 99   BUN 8 - 23 mg/dL 16   Creatinine 9.55 - 1.00 mg/dL 9.16   Sodium 864 - 854 mmol/L 137   Potassium 3.5 - 5.1 mmol/L 3.9   Chloride 98 - 111 mmol/L 108   CO2 22 - 32 mmol/L 23   Calcium  8.9 - 10.3 mg/dL 8.6   Total Protein 6.5 - 8.1 g/dL 6.9   Total Bilirubin 0.0 - 1.2 mg/dL 0.4   Alkaline Phos 38 - 126 U/L 90   AST 15 - 41 U/L 18    ALT 0 - 44 U/L 12       Latest Ref Rng & Units 08/16/2023    2:39 PM  CBC  WBC 4.0 - 10.5 K/uL 4.0   Hemoglobin 12.0 - 15.0 g/dL 88.6   Hematocrit 63.9 - 46.0 % 34.2   Platelets 150 - 400 K/uL 192       Assessment and plan- Patient is a 62 y.o. female with history of stage II pancreatic adenocarcinoma s/p surgery and chemotherapy presently in remission here for routine follow-up    Patient is following up with [facility] Dr. Romero and underwent CT pancreatic mass protocol along with CT chest on 08/05/2023.  Scans did not show any evidence of recurrent or metastatic disease.  She remains in remission as far as her pancreatic cancer is concerned..  I will see her back in 6 months with CBC with differential CMP and CA 19-9.  Levels from today are pending.  Patient has a history of iron  deficiency anemia and last received IV Venofer  in April 2025.  Patient's hemoglobin is at  her baseline between 10.5-11.5.  Iron  studies are not indicative of iron  deficiency.  CBC ferritin and iron  studies in 3 months in 6 months and I will see her back in 6 months Visit Diagnosis 1. Encounter for follow-up surveillance of pancreatic cancer      Dr. Annah Skene, MD, MPH [facility] at [facility] [phone] 08/16/2023 12:56 PM

## 2023-08-17 ENCOUNTER — Other Ambulatory Visit

## 2023-08-17 ENCOUNTER — Ambulatory Visit: Admitting: Oncology

## 2023-08-17 LAB — CANCER ANTIGEN 19-9: CA 19-9: 32 U/mL (ref 0–35)

## 2023-08-19 ENCOUNTER — Other Ambulatory Visit

## 2023-11-15 ENCOUNTER — Other Ambulatory Visit: Payer: Self-pay

## 2023-11-15 DIAGNOSIS — D649 Anemia, unspecified: Secondary | ICD-10-CM

## 2023-11-15 DIAGNOSIS — Z08 Encounter for follow-up examination after completed treatment for malignant neoplasm: Secondary | ICD-10-CM

## 2023-11-16 ENCOUNTER — Inpatient Hospital Stay: Attending: Oncology

## 2023-11-16 DIAGNOSIS — Z08 Encounter for follow-up examination after completed treatment for malignant neoplasm: Secondary | ICD-10-CM | POA: Diagnosis not present

## 2023-11-16 DIAGNOSIS — Z8507 Personal history of malignant neoplasm of pancreas: Secondary | ICD-10-CM | POA: Diagnosis present

## 2023-11-16 DIAGNOSIS — D649 Anemia, unspecified: Secondary | ICD-10-CM | POA: Diagnosis not present

## 2023-11-16 LAB — IRON AND TIBC
Iron: 67 ug/dL (ref 28–170)
Saturation Ratios: 19 % (ref 10.4–31.8)
TIBC: 360 ug/dL (ref 250–450)
UIBC: 293 ug/dL

## 2023-11-16 LAB — CBC (CANCER CENTER ONLY)
HCT: 34.6 % — ABNORMAL LOW (ref 36.0–46.0)
Hemoglobin: 11.6 g/dL — ABNORMAL LOW (ref 12.0–15.0)
MCH: 33.7 pg (ref 26.0–34.0)
MCHC: 33.5 g/dL (ref 30.0–36.0)
MCV: 100.6 fL — ABNORMAL HIGH (ref 80.0–100.0)
Platelet Count: 196 K/uL (ref 150–400)
RBC: 3.44 MIL/uL — ABNORMAL LOW (ref 3.87–5.11)
RDW: 12.3 % (ref 11.5–15.5)
WBC Count: 4.2 K/uL (ref 4.0–10.5)
nRBC: 0 % (ref 0.0–0.2)

## 2023-11-16 LAB — FERRITIN: Ferritin: 126 ng/mL (ref 11–307)

## 2023-11-23 ENCOUNTER — Other Ambulatory Visit: Payer: Self-pay

## 2023-11-23 DIAGNOSIS — Z1231 Encounter for screening mammogram for malignant neoplasm of breast: Secondary | ICD-10-CM

## 2023-12-27 ENCOUNTER — Ambulatory Visit
Admission: RE | Admit: 2023-12-27 | Discharge: 2023-12-27 | Disposition: A | Discharge status: Home / Self Care | Source: Ambulatory Visit

## 2023-12-27 DIAGNOSIS — Z1231 Encounter for screening mammogram for malignant neoplasm of breast: Secondary | ICD-10-CM | POA: Insufficient documentation

## 2024-01-26 ENCOUNTER — Other Ambulatory Visit: Payer: Self-pay | Admitting: Oncology

## 2024-02-21 ENCOUNTER — Other Ambulatory Visit

## 2024-02-21 ENCOUNTER — Inpatient Hospital Stay: Admitting: Oncology

## 2024-02-21 ENCOUNTER — Encounter: Payer: Self-pay | Admitting: Oncology

## 2024-02-21 ENCOUNTER — Ambulatory Visit: Admitting: Oncology

## 2024-02-21 ENCOUNTER — Inpatient Hospital Stay: Attending: Oncology

## 2024-02-21 VITALS — BP 142/51 | HR 101 | Temp 98.6°F | Resp 19 | Wt 133.6 lb

## 2024-02-21 DIAGNOSIS — Z820 Family history of epilepsy and other diseases of the nervous system: Secondary | ICD-10-CM | POA: Insufficient documentation

## 2024-02-21 DIAGNOSIS — Z888 Allergy status to other drugs, medicaments and biological substances status: Secondary | ICD-10-CM | POA: Diagnosis not present

## 2024-02-21 DIAGNOSIS — F319 Bipolar disorder, unspecified: Secondary | ICD-10-CM | POA: Diagnosis not present

## 2024-02-21 DIAGNOSIS — Z823 Family history of stroke: Secondary | ICD-10-CM | POA: Diagnosis not present

## 2024-02-21 DIAGNOSIS — Z8507 Personal history of malignant neoplasm of pancreas: Secondary | ICD-10-CM | POA: Insufficient documentation

## 2024-02-21 DIAGNOSIS — Z9221 Personal history of antineoplastic chemotherapy: Secondary | ICD-10-CM | POA: Diagnosis not present

## 2024-02-21 DIAGNOSIS — Z87440 Personal history of urinary (tract) infections: Secondary | ICD-10-CM | POA: Diagnosis not present

## 2024-02-21 DIAGNOSIS — F1290 Cannabis use, unspecified, uncomplicated: Secondary | ICD-10-CM | POA: Insufficient documentation

## 2024-02-21 DIAGNOSIS — Z90711 Acquired absence of uterus with remaining cervical stump: Secondary | ICD-10-CM | POA: Insufficient documentation

## 2024-02-21 DIAGNOSIS — Z87891 Personal history of nicotine dependence: Secondary | ICD-10-CM | POA: Insufficient documentation

## 2024-02-21 DIAGNOSIS — Z8249 Family history of ischemic heart disease and other diseases of the circulatory system: Secondary | ICD-10-CM | POA: Insufficient documentation

## 2024-02-21 DIAGNOSIS — Z9049 Acquired absence of other specified parts of digestive tract: Secondary | ICD-10-CM | POA: Diagnosis not present

## 2024-02-21 DIAGNOSIS — Z806 Family history of leukemia: Secondary | ICD-10-CM | POA: Diagnosis not present

## 2024-02-21 DIAGNOSIS — Z833 Family history of diabetes mellitus: Secondary | ICD-10-CM | POA: Insufficient documentation

## 2024-02-21 DIAGNOSIS — Z885 Allergy status to narcotic agent status: Secondary | ICD-10-CM | POA: Insufficient documentation

## 2024-02-21 DIAGNOSIS — Z08 Encounter for follow-up examination after completed treatment for malignant neoplasm: Secondary | ICD-10-CM

## 2024-02-21 DIAGNOSIS — D649 Anemia, unspecified: Secondary | ICD-10-CM

## 2024-02-21 DIAGNOSIS — D7589 Other specified diseases of blood and blood-forming organs: Secondary | ICD-10-CM | POA: Diagnosis not present

## 2024-02-21 DIAGNOSIS — Z881 Allergy status to other antibiotic agents status: Secondary | ICD-10-CM | POA: Insufficient documentation

## 2024-02-21 DIAGNOSIS — Z818 Family history of other mental and behavioral disorders: Secondary | ICD-10-CM | POA: Diagnosis not present

## 2024-02-21 DIAGNOSIS — Z79899 Other long term (current) drug therapy: Secondary | ICD-10-CM | POA: Diagnosis not present

## 2024-02-21 DIAGNOSIS — Z8614 Personal history of Methicillin resistant Staphylococcus aureus infection: Secondary | ICD-10-CM | POA: Diagnosis not present

## 2024-02-21 DIAGNOSIS — Z808 Family history of malignant neoplasm of other organs or systems: Secondary | ICD-10-CM | POA: Insufficient documentation

## 2024-02-21 DIAGNOSIS — Z803 Family history of malignant neoplasm of breast: Secondary | ICD-10-CM | POA: Insufficient documentation

## 2024-02-21 DIAGNOSIS — Z882 Allergy status to sulfonamides status: Secondary | ICD-10-CM | POA: Diagnosis not present

## 2024-02-21 DIAGNOSIS — Z811 Family history of alcohol abuse and dependence: Secondary | ICD-10-CM | POA: Insufficient documentation

## 2024-02-21 LAB — CMP (CANCER CENTER ONLY)
ALT: 21 U/L (ref 0–44)
AST: 29 U/L (ref 15–41)
Albumin: 4.1 g/dL (ref 3.5–5.0)
Alkaline Phosphatase: 110 U/L (ref 38–126)
Anion gap: 11 (ref 5–15)
BUN: 7 mg/dL — ABNORMAL LOW (ref 8–23)
CO2: 24 mmol/L (ref 22–32)
Calcium: 9.4 mg/dL (ref 8.9–10.3)
Chloride: 102 mmol/L (ref 98–111)
Creatinine: 0.99 mg/dL (ref 0.44–1.00)
GFR, Estimated: >60 mL/min
Glucose, Bld: 98 mg/dL (ref 70–99)
Potassium: 3.8 mmol/L (ref 3.5–5.1)
Sodium: 138 mmol/L (ref 135–145)
Total Bilirubin: 0.3 mg/dL (ref 0.0–1.2)
Total Protein: 7.4 g/dL (ref 6.5–8.1)

## 2024-02-21 LAB — IRON AND TIBC
Iron: 49 ug/dL (ref 28–170)
Saturation Ratios: 14 % (ref 10.4–31.8)
TIBC: 344 ug/dL (ref 250–450)
UIBC: 295 ug/dL

## 2024-02-21 LAB — CBC WITH DIFFERENTIAL (CANCER CENTER ONLY)
Abs Immature Granulocytes: 0.02 K/uL (ref 0.00–0.07)
Basophils Absolute: 0 K/uL (ref 0.0–0.1)
Basophils Relative: 0 %
Eosinophils Absolute: 0.1 K/uL (ref 0.0–0.5)
Eosinophils Relative: 1 %
HCT: 34.1 % — ABNORMAL LOW (ref 36.0–46.0)
Hemoglobin: 11.5 g/dL — ABNORMAL LOW (ref 12.0–15.0)
Immature Granulocytes: 0 %
Lymphocytes Relative: 11 %
Lymphs Abs: 0.5 K/uL — ABNORMAL LOW (ref 0.7–4.0)
MCH: 34.2 pg — ABNORMAL HIGH (ref 26.0–34.0)
MCHC: 33.7 g/dL (ref 30.0–36.0)
MCV: 101.5 fL — ABNORMAL HIGH (ref 80.0–100.0)
Monocytes Absolute: 0.4 K/uL (ref 0.1–1.0)
Monocytes Relative: 9 %
Neutro Abs: 3.7 K/uL (ref 1.7–7.7)
Neutrophils Relative %: 79 %
Platelet Count: 190 K/uL (ref 150–400)
RBC: 3.36 MIL/uL — ABNORMAL LOW (ref 3.87–5.11)
RDW: 12.7 % (ref 11.5–15.5)
WBC Count: 4.7 K/uL (ref 4.0–10.5)
nRBC: 0 % (ref 0.0–0.2)

## 2024-02-21 LAB — FERRITIN: Ferritin: 375 ng/mL — ABNORMAL HIGH (ref 11–307)

## 2024-02-21 NOTE — Progress Notes (Signed)
 Survivorship Care Plan visit completed.  Treatment summary reviewed and given to patient.  ASCO answers booklet reviewed and given to patient.  CARE program and Cancer Transitions discussed with patient along with other resources cancer center offers to patients and caregivers.  Patient verbalized understanding.

## 2024-02-22 LAB — CA 19-9 (SERIAL): CA 19-9: 27 U/mL (ref 0–35)

## 2024-02-26 ENCOUNTER — Encounter: Payer: Self-pay | Admitting: Oncology

## 2024-02-26 NOTE — Progress Notes (Signed)
 "    Hematology/Oncology Consult note [facility]  [phone] [phone]  Patient Care Team: Britta King, MD as PCP - General (Internal Medicine) Malvina Eck, DC as Referring Physician (Chiropractic Medicine) Dellie Louanne MATSU, MD (General Surgery) Melanee Annah BROCKS, MD as Consulting Physician (Oncology)   Name of the patient: Sheryl Perez  [id number]  04/20/1960   Date of visit: 02/26/24  Diagnosis-  Cancer Staging  Pancreatic adenocarcinoma [facility]) Staging form: Exocrine Pancreas, AJCC 8th Edition - Clinical stage from 09/01/2020: Stage IA (cT1, cN0, cM0) - Signed by Melanee Annah BROCKS, MD on 09/01/2020 Total positive nodes: 0 - Pathologic stage from 02/16/2021: Stage IIB (pT2, pN1, cM0) - Signed by Melanee Annah BROCKS, MD on 02/16/2021 Residual tumor (R): R0    Chief complaint/ Reason for visit- routine surveillance visit for pancreatic cancer  Heme/Onc history: Patient is a 64 year old female diagnosed with T2 N0 M0 pancreatic adenocarcinoma in June 2022.  She received 3 months of neoadjuvant modified FOLFIRINOX chemotherapy and underwent Whipple surgery at [facility] in December 2022.  Final pathology showed 2.3 cm grade 2 moderately differentiated adenocarcinoma with lymphovascular and perineural invasion.  Treatment effect present with residual cancer showing evident tumor regression but more than single cells or rare small group of cancer cells.  Partial response.  Margins negative.  1 out of 22 lymph nodes positive for malignancy.  She went on to complete 3 more months of adjuvant FOLFIRINOX chemotherapy in March 2023.  She is presently in remission.   She also has history of iron  deficiency anemia for which she receives IV iron .  Last colonoscopy and endoscopy in July 2020 was unremarkable    Interval history- Sheryl Perez is a 64 year old female with pancreatic cancer in remission who presents for routine surveillance and evaluation of  macrocytosis.  She is nearly four years from initial pancreatic cancer diagnosis and three years post-surgical resection. She completed adjuvant chemotherapy in March 2023. Surveillance imaging in December 2025 showed no evidence of disease recurrence. Hemoglobin is stable at 11.5 g/dL, and she continues iron  supplementation.  Macrocytosis was identified on recent laboratory studies. She denies alcohol use and has no associated symptoms such as fatigue or neurologic changes. Iron  studies are pending, and further evaluation for thyroid  or B12 deficiency is planned if indicated.  She reports callus-like lesions on the bottom and side of her feet, which can be peeled off periodically. She has a history of foot surgery for similar issues and was informed by her foot specialist that these findings are age-related thinning of the foot pads.  She is currently separated, lives with her daughter and two grandchildren, continues to work, and maintains her mental health. No other acute concerns were raised.       ECOG PS- 0 Pain scale- 0   Review of systems- Review of Systems  Constitutional:  Negative for chills, fever, malaise/fatigue and weight loss.  HENT:  Negative for congestion, ear discharge and nosebleeds.   Eyes:  Negative for blurred vision.  Respiratory:  Negative for cough, hemoptysis, sputum production, shortness of breath and wheezing.   Cardiovascular:  Negative for chest pain, palpitations, orthopnea and claudication.  Gastrointestinal:  Negative for abdominal pain, blood in stool, constipation, diarrhea, heartburn, melena, nausea and vomiting.  Genitourinary:  Negative for dysuria, flank pain, frequency, hematuria and urgency.  Musculoskeletal:  Negative for back pain, joint pain and myalgias.  Skin:  Negative for rash.  Neurological:  Negative for dizziness, tingling, focal weakness, seizures, weakness  and headaches.  Endo/Heme/Allergies:  Does not bruise/bleed easily.   Psychiatric/Behavioral:  Negative for depression and suicidal ideas. The patient does not have insomnia.       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    Bipolar disorder (HCC)    Chest pain, unspecified    Chronic low back pain 10/23/2013   Chronic UTI    COPD (chronic obstructive pulmonary disease) (HCC)    Depression    Dysfunction of eustachian tube    Enlargement of lymph nodes    Family history of brain cancer    Family history of breast cancer    Family history of prostate cancer    History of Bell's palsy Right   Injury, other and unspecified, knee, leg, ankle, and foot    MRSA (methicillin resistant staph aureus) culture positive 07/11/2014   abscess   Other specified disease of hair and hair follicles    Pancreatic cancer (HCC)    Pancreatic cancer (HCC)    Rectal prolapse    Unspecified symptom associated with female genital organs      Past Surgical History:  Procedure Laterality Date   ABDOMINAL HYSTERECTOMY     bladder tack  2010   CHOLECYSTECTOMY  2002   FOOT SURGERY Bilateral 2004   bunion   GANGLION CYST EXCISION  2002   INCISION AND DRAINAGE ABSCESS  04-16-15   PARTIAL HYSTERECTOMY  2010   uterus removed   PORTA CATH INSERTION N/A 09/11/2020   Procedure: PORTA CATH INSERTION;  Surgeon: Marea Selinda RAMAN, MD;  Location: [facility] INVASIVE CV LAB;  Service: Cardiovascular;  Laterality: N/A;   PORTA CATH REMOVAL N/A 04/26/2022   Procedure: PORTA CATH REMOVAL;  Surgeon: Marea Selinda RAMAN, MD;  Location: [facility] INVASIVE CV LAB;  Service: Cardiovascular;  Laterality: N/A;    Social History   Socioeconomic History   Marital status: Married    Spouse name: Not on file   Number of children: 3   Years of education: hs   Highest education level: Some college, no degree  Occupational History   Not on file  Tobacco Use   Smoking status: Former    Current packs/day: 0.00    Average  packs/day: 0.5 packs/day for 40.0 years (20.0 ttl pk-yrs)    Types: Cigarettes    Start date: 07/14/1980    Quit date: 07/14/2020    Years since quitting: 3.6   Smokeless tobacco: Never  Vaping Use   Vaping status: Never Used  Substance and Sexual Activity   Alcohol use: No    Alcohol/week: 1.0 standard drink of alcohol    Types: 1 Glasses of wine per week   Drug use: Yes    Frequency: 7.0 times per week    Types: Marijuana   Sexual activity: Yes    Birth control/protection: None  Other Topics Concern   Not on file  Social History Narrative   Patient lives at home with her husband Kristan).   Patient works full time.   Education CNA    Right handed.   Caffeine Tea , Soda.   Social Drivers of Health   Tobacco Use: Medium Risk (02/22/2024)   Received from [facility]   Patient History    Smoking Tobacco Use: Former    Smokeless Tobacco Use: Unknown    Passive Exposure: Not on file  Financial Resource Strain: Not on file  Food Insecurity:  No Food Insecurity (08/29/2023)   Received from [facility]   Epic    Within the past 12 months, you worried that your food would run out before you got the money to buy more.: Never true    Within the past 12 months, the food you bought just didn't last and you didn't have money to get more.: Never true  Transportation Needs: No Transportation Needs (08/29/2023)   Received from [facility] - Transportation    Lack of Transportation (Medical): No    Lack of Transportation (Non-Medical): No  Physical Activity: Not on file  Stress: Not on file  Social Connections: Not on file  Intimate Partner Violence: Not on file  Depression (PHQ2-9): Low Risk (10/27/2022)   Depression (PHQ2-9)    PHQ-2 Score: 0  Alcohol Screen: Not on file  Housing: Unknown (08/05/2023)   Received from [facility]   Epic    Unable to Pay for Housing in the Last Year: Not on file    Number of Times Moved in the Last Year: Not on  file    At any time in the past 12 months, were you homeless or living in a shelter (including now)?: No  Utilities: Low Risk (08/29/2023)   Received from [facility]   Utilities    Within the past 12 months, have you been unable to get utilities(heat, electricity) when it was really needed?: No  Health Literacy: Not on file    Family History  Problem Relation Age of Onset   Diabetes Father    Cancer Father        brain tumor   Depression Father    Depression Sister    Alcohol abuse Brother    Depression Brother    Leukemia Maternal Aunt    Prostate cancer Maternal Uncle    Breast cancer Paternal Aunt        dx >50   Heart disease Maternal Grandmother    Heart attack Maternal Grandmother    Stroke Maternal Grandmother    Parkinson's disease Maternal Grandfather    Leukemia Paternal Grandmother    Diabetes Paternal Grandfather    Breast cancer Cousin    Brain cancer Niece        dx under 50    Current Medications[2]  Physical exam:  Vitals:   02/21/24 1516  BP: (!) 142/51  Pulse: (!) 101  Resp: 19  Temp: 98.6 F (37 C)  SpO2: 99%  Weight: 133 lb 9.6 oz (60.6 kg)   Physical Exam Cardiovascular:     Rate and Rhythm: Normal rate and regular rhythm.     Heart sounds: Normal heart sounds.  Pulmonary:     Effort: Pulmonary effort is normal.  Abdominal:     General: Bowel sounds are normal. There is no distension.     Palpations: Abdomen is soft.     Tenderness: There is no abdominal tenderness.  Skin:    General: Skin is warm and dry.  Neurological:     Mental Status: She is alert and oriented to person, place, and time.      I have personally reviewed labs listed below:    Latest Ref Rng & Units 02/21/2024    3:05 PM  CMP  Glucose 70 - 99 mg/dL 98   BUN 8 - 23 mg/dL 7   Creatinine 9.55 - 8.99 mg/dL 9.00   Sodium 864 - 854 mmol/L 138   Potassium 3.5 - 5.1  mmol/L 3.8   Chloride 98 - 111 mmol/L 102   CO2 22 - 32 mmol/L 24   Calcium  8.9 - 10.3 mg/dL  9.4   Total Protein 6.5 - 8.1 g/dL 7.4   Total Bilirubin 0.0 - 1.2 mg/dL 0.3   Alkaline Phos 38 - 126 U/L 110   AST 15 - 41 U/L 29   ALT 0 - 44 U/L 21       Latest Ref Rng & Units 02/21/2024    3:05 PM  CBC  WBC 4.0 - 10.5 K/uL 4.7   Hemoglobin 12.0 - 15.0 g/dL 88.4   Hematocrit 63.9 - 46.0 % 34.1   Platelets 150 - 400 K/uL 190       Assessment and plan- Patient is a 64 y.o. female here for a routine surveillance visit for pancreatic cancer  Assessment and Plan    Pancreatic cancer in remission In remission, three years post-diagnosis, over one year post-chemotherapy and surgery. Recent imaging at [facility] showed no recurrence. Asymptomatic. - Scheduled follow-up in six months.  Macrocytosis Stable hemoglobin at 11.5 g/dL with macrocytosis. Differential includes thyroid  dysfunction, vitamin B12 deficiency, and alcohol use; denies alcohol consumption. Iron  studies pending. - Re-evaluate macrocytosis and assess thyroid  and vitamin B12 levels at next visit in six months. - Will contact if iron  studies are abnormal.         Visit Diagnosis 1. Encounter for follow-up surveillance of pancreatic cancer      Dr. Annah Skene, MD, MPH [facility] [phone] 02/26/2024 4:09 PM                   [1]  Allergies Allergen Reactions   Sulfacetamide Sodium Swelling    Diarrhea,dizziness, tongue swelling   Other Other (See Comments) and Swelling    PT states that anything with the sides effects may cause rash she cannot take due to her psoriasis, it will make the patches on her hand so thick she can't use her hands.  Diarrhea,dizziness, tongue swelling   Oxycontin [Oxycodone Hcl] Nausea And Vomiting   Sulfa Antibiotics Swelling    Diarrhea,dizziness, tongue swelling   Abilify [Aripiprazole] Other (See Comments)   Cephalosporins Rash   Erythromycin Other (See Comments)    Abd. pain Abd. pain Other reaction(s): Other (See Comments) Abd.  pain   Lamotrigine  Rash and Other (See Comments)    psorasis worsen psorasis worsen   Risperidone Other (See Comments)    tremors tremors tremors  [2]  Current Outpatient Medications:    albuterol  (VENTOLIN  HFA) 108 (90 Base) MCG/ACT inhaler, TAKE 2 PUFFS BY MOUTH EVERY 6 HOURS AS NEEDED FOR WHEEZE OR SHORTNESS OF BREATH, Disp: 8 each, Rfl: 4   calcium -vitamin D (OSCAL WITH D) 500-5 MG-MCG tablet, Take 1 tablet by mouth daily., Disp: , Rfl:    clobetasol  cream (TEMOVATE ) 0.05 %, Apply 1 Application topically 2 (two) times daily., Disp: 60 g, Rfl: 2   divalproex  (DEPAKOTE ) 250 MG DR tablet, Take 1 tablet (250 mg total) by mouth daily., Disp: 90 tablet, Rfl: 1   famotidine  (PEPCID ) 20 MG tablet, TAKE 1 TABLET BY MOUTH TWICE A DAY, Disp: 180 tablet, Rfl: 3   famotidine  (PEPCID ) 20 MG tablet, Take 1 tablet (20 mg total) by mouth 2 (two) times daily., Disp: 180 tablet, Rfl: 3   loperamide  (IMODIUM  A-D) 2 MG tablet, Take 2 at onset of diarrhea, then 1 every 2hrs until 12hr without a BM. May take 2 tab every 4hrs  at bedtime. If diarrhea recurs repeat., Disp: 100 tablet, Rfl: 1   loratadine  (CLARITIN ) 10 MG tablet, TAKE 1 TABLET BY MOUTH EVERY DAY, Disp: 90 tablet, Rfl: 1   lurasidone (LATUDA) 40 MG TABS tablet, Take 40 mg by mouth daily with breakfast., Disp: , Rfl:    potassium chloride  SA (KLOR-CON  M) 20 MEQ tablet, TAKE 1 TABLET BY MOUTH EVERY DAY, Disp: 90 tablet, Rfl: 1 No current facility-administered medications for this visit.  Facility-Administered Medications Ordered in Other Visits:    heparin  lock flush 100 UNIT/ML injection, , , ,    heparin  lock flush 100 UNIT/ML injection, , , ,    heparin  lock flush 100 unit/mL, 500 Units, Intravenous, Once, Melanee Annah BROCKS, MD   heparin  lock flush 100 unit/mL, 500 Units, Intravenous, Once, Melanee Annah BROCKS, MD   sodium chloride  flush (NS) 0.9 % injection 10 mL, 10 mL, Intracatheter, PRN, Melanee Annah BROCKS, MD, 10 mL at 11/03/20 1441   sodium chloride   flush (NS) 0.9 % injection 10 mL, 10 mL, Intravenous, PRN, Endiya Klahr C, MD  "

## 2024-08-20 ENCOUNTER — Inpatient Hospital Stay

## 2024-08-20 ENCOUNTER — Inpatient Hospital Stay: Admitting: Oncology
